# Patient Record
Sex: Female | Born: 1940 | Race: White | Hispanic: No | Smoking: Never smoker
Health system: Southern US, Community
[De-identification: ages and names within clinical notes are randomized; demographics above are authoritative.]

## PROBLEM LIST (undated history)

## (undated) DIAGNOSIS — I252 Old myocardial infarction: Secondary | ICD-10-CM

## (undated) DIAGNOSIS — E1169 Type 2 diabetes mellitus with other specified complication: Secondary | ICD-10-CM

## (undated) DIAGNOSIS — N179 Acute kidney failure, unspecified: Secondary | ICD-10-CM

## (undated) DIAGNOSIS — G459 Transient cerebral ischemic attack, unspecified: Secondary | ICD-10-CM

## (undated) DIAGNOSIS — I739 Peripheral vascular disease, unspecified: Secondary | ICD-10-CM

## (undated) DIAGNOSIS — I10 Essential (primary) hypertension: Secondary | ICD-10-CM

## (undated) DIAGNOSIS — F419 Anxiety disorder, unspecified: Secondary | ICD-10-CM

## (undated) DIAGNOSIS — E079 Disorder of thyroid, unspecified: Secondary | ICD-10-CM

## (undated) DIAGNOSIS — E1151 Type 2 diabetes mellitus with diabetic peripheral angiopathy without gangrene: Secondary | ICD-10-CM

## (undated) DIAGNOSIS — E785 Hyperlipidemia, unspecified: Secondary | ICD-10-CM

## (undated) DIAGNOSIS — I4891 Unspecified atrial fibrillation: Secondary | ICD-10-CM

## (undated) HISTORY — PX: ABDOMINAL HYSTERECTOMY: SHX81

## (undated) HISTORY — PX: CARDIAC CATHETERIZATION: SHX172

## (undated) HISTORY — PX: CHOLECYSTECTOMY: SHX55

---

## 2011-05-11 DIAGNOSIS — E119 Type 2 diabetes mellitus without complications: Secondary | ICD-10-CM | POA: Insufficient documentation

## 2011-05-11 DIAGNOSIS — M1990 Unspecified osteoarthritis, unspecified site: Secondary | ICD-10-CM | POA: Insufficient documentation

## 2014-01-25 ENCOUNTER — Encounter (HOSPITAL_COMMUNITY): Payer: Self-pay | Admitting: Anesthesiology

## 2014-01-25 ENCOUNTER — Encounter (HOSPITAL_COMMUNITY): Payer: Medicare Other | Admitting: Anesthesiology

## 2014-01-25 ENCOUNTER — Other Ambulatory Visit: Payer: Self-pay | Admitting: Cardiology

## 2014-01-25 ENCOUNTER — Emergency Department (HOSPITAL_COMMUNITY): Payer: Medicare Other

## 2014-01-25 ENCOUNTER — Emergency Department (HOSPITAL_COMMUNITY): Payer: Medicare Other | Admitting: Anesthesiology

## 2014-01-25 ENCOUNTER — Encounter (HOSPITAL_COMMUNITY): Payer: Self-pay | Admitting: Emergency Medicine

## 2014-01-25 ENCOUNTER — Ambulatory Visit (HOSPITAL_COMMUNITY): Admit: 2014-01-25 | Payer: Self-pay | Admitting: Cardiology

## 2014-01-25 ENCOUNTER — Inpatient Hospital Stay (HOSPITAL_COMMUNITY)
Admission: EM | Admit: 2014-01-25 | Discharge: 2014-03-06 | DRG: 216 | Disposition: A | Discharge status: Other Acute Inpatient Hospital | Payer: Medicare Other | Attending: Cardiothoracic Surgery | Admitting: Cardiothoracic Surgery

## 2014-01-25 ENCOUNTER — Encounter (HOSPITAL_COMMUNITY)
Admission: EM | Disposition: A | Discharge status: Nursing Home (Includes ICF) | Payer: Self-pay | Source: Home / Self Care | Attending: Cardiothoracic Surgery

## 2014-01-25 ENCOUNTER — Encounter (HOSPITAL_COMMUNITY)
Admission: EM | Disposition: A | Discharge status: Nursing Home (Includes ICF) | Payer: Medicare Other | Source: Home / Self Care | Attending: Cardiothoracic Surgery

## 2014-01-25 DIAGNOSIS — I252 Old myocardial infarction: Secondary | ICD-10-CM

## 2014-01-25 DIAGNOSIS — I50810 Right heart failure, unspecified: Secondary | ICD-10-CM

## 2014-01-25 DIAGNOSIS — I482 Chronic atrial fibrillation, unspecified: Secondary | ICD-10-CM

## 2014-01-25 DIAGNOSIS — M6250 Muscle wasting and atrophy, not elsewhere classified, unspecified site: Secondary | ICD-10-CM | POA: Diagnosis not present

## 2014-01-25 DIAGNOSIS — Z951 Presence of aortocoronary bypass graft: Secondary | ICD-10-CM

## 2014-01-25 DIAGNOSIS — I5033 Acute on chronic diastolic (congestive) heart failure: Secondary | ICD-10-CM | POA: Diagnosis not present

## 2014-01-25 DIAGNOSIS — I4891 Unspecified atrial fibrillation: Secondary | ICD-10-CM | POA: Diagnosis present

## 2014-01-25 DIAGNOSIS — E872 Acidosis, unspecified: Secondary | ICD-10-CM | POA: Diagnosis not present

## 2014-01-25 DIAGNOSIS — N189 Chronic kidney disease, unspecified: Secondary | ICD-10-CM | POA: Diagnosis present

## 2014-01-25 DIAGNOSIS — E785 Hyperlipidemia, unspecified: Secondary | ICD-10-CM | POA: Diagnosis present

## 2014-01-25 DIAGNOSIS — Z7982 Long term (current) use of aspirin: Secondary | ICD-10-CM

## 2014-01-25 DIAGNOSIS — E1151 Type 2 diabetes mellitus with diabetic peripheral angiopathy without gangrene: Secondary | ICD-10-CM | POA: Diagnosis present

## 2014-01-25 DIAGNOSIS — E1159 Type 2 diabetes mellitus with other circulatory complications: Secondary | ICD-10-CM | POA: Diagnosis present

## 2014-01-25 DIAGNOSIS — J90 Pleural effusion, not elsewhere classified: Secondary | ICD-10-CM | POA: Diagnosis not present

## 2014-01-25 DIAGNOSIS — E871 Hypo-osmolality and hyponatremia: Secondary | ICD-10-CM | POA: Diagnosis not present

## 2014-01-25 DIAGNOSIS — E46 Unspecified protein-calorie malnutrition: Secondary | ICD-10-CM | POA: Diagnosis not present

## 2014-01-25 DIAGNOSIS — I10 Essential (primary) hypertension: Secondary | ICD-10-CM | POA: Diagnosis present

## 2014-01-25 DIAGNOSIS — J9601 Acute respiratory failure with hypoxia: Secondary | ICD-10-CM

## 2014-01-25 DIAGNOSIS — R1312 Dysphagia, oropharyngeal phase: Secondary | ICD-10-CM | POA: Diagnosis not present

## 2014-01-25 DIAGNOSIS — J9600 Acute respiratory failure, unspecified whether with hypoxia or hypercapnia: Secondary | ICD-10-CM | POA: Diagnosis not present

## 2014-01-25 DIAGNOSIS — E876 Hypokalemia: Secondary | ICD-10-CM | POA: Diagnosis not present

## 2014-01-25 DIAGNOSIS — F0390 Unspecified dementia without behavioral disturbance: Secondary | ICD-10-CM | POA: Diagnosis present

## 2014-01-25 DIAGNOSIS — J95822 Acute and chronic postprocedural respiratory failure: Secondary | ICD-10-CM | POA: Diagnosis not present

## 2014-01-25 DIAGNOSIS — I472 Ventricular tachycardia, unspecified: Secondary | ICD-10-CM | POA: Diagnosis present

## 2014-01-25 DIAGNOSIS — I2789 Other specified pulmonary heart diseases: Secondary | ICD-10-CM | POA: Diagnosis present

## 2014-01-25 DIAGNOSIS — I2510 Atherosclerotic heart disease of native coronary artery without angina pectoris: Secondary | ICD-10-CM

## 2014-01-25 DIAGNOSIS — D62 Acute posthemorrhagic anemia: Secondary | ICD-10-CM | POA: Diagnosis not present

## 2014-01-25 DIAGNOSIS — IMO0002 Reserved for concepts with insufficient information to code with codable children: Secondary | ICD-10-CM | POA: Diagnosis not present

## 2014-01-25 DIAGNOSIS — I498 Other specified cardiac arrhythmias: Secondary | ICD-10-CM | POA: Diagnosis present

## 2014-01-25 DIAGNOSIS — E039 Hypothyroidism, unspecified: Secondary | ICD-10-CM | POA: Diagnosis present

## 2014-01-25 DIAGNOSIS — I4729 Other ventricular tachycardia: Secondary | ICD-10-CM | POA: Diagnosis present

## 2014-01-25 DIAGNOSIS — I519 Heart disease, unspecified: Secondary | ICD-10-CM | POA: Diagnosis not present

## 2014-01-25 DIAGNOSIS — I2119 ST elevation (STEMI) myocardial infarction involving other coronary artery of inferior wall: Secondary | ICD-10-CM

## 2014-01-25 DIAGNOSIS — I4819 Other persistent atrial fibrillation: Secondary | ICD-10-CM

## 2014-01-25 DIAGNOSIS — T502X5A Adverse effect of carbonic-anhydrase inhibitors, benzothiadiazides and other diuretics, initial encounter: Secondary | ICD-10-CM | POA: Diagnosis not present

## 2014-01-25 DIAGNOSIS — I2582 Chronic total occlusion of coronary artery: Secondary | ICD-10-CM | POA: Diagnosis present

## 2014-01-25 DIAGNOSIS — Z8249 Family history of ischemic heart disease and other diseases of the circulatory system: Secondary | ICD-10-CM

## 2014-01-25 DIAGNOSIS — E8779 Other fluid overload: Secondary | ICD-10-CM

## 2014-01-25 DIAGNOSIS — I484 Atypical atrial flutter: Secondary | ICD-10-CM

## 2014-01-25 DIAGNOSIS — N170 Acute kidney failure with tubular necrosis: Secondary | ICD-10-CM | POA: Diagnosis not present

## 2014-01-25 DIAGNOSIS — I213 ST elevation (STEMI) myocardial infarction of unspecified site: Secondary | ICD-10-CM

## 2014-01-25 DIAGNOSIS — I4892 Unspecified atrial flutter: Secondary | ICD-10-CM | POA: Diagnosis not present

## 2014-01-25 DIAGNOSIS — J9811 Atelectasis: Secondary | ICD-10-CM

## 2014-01-25 DIAGNOSIS — N179 Acute kidney failure, unspecified: Secondary | ICD-10-CM | POA: Diagnosis present

## 2014-01-25 DIAGNOSIS — I70209 Unspecified atherosclerosis of native arteries of extremities, unspecified extremity: Secondary | ICD-10-CM | POA: Diagnosis present

## 2014-01-25 DIAGNOSIS — I480 Paroxysmal atrial fibrillation: Secondary | ICD-10-CM

## 2014-01-25 DIAGNOSIS — R34 Anuria and oliguria: Secondary | ICD-10-CM | POA: Diagnosis not present

## 2014-01-25 DIAGNOSIS — E669 Obesity, unspecified: Secondary | ICD-10-CM | POA: Diagnosis present

## 2014-01-25 DIAGNOSIS — G929 Unspecified toxic encephalopathy: Secondary | ICD-10-CM | POA: Diagnosis not present

## 2014-01-25 DIAGNOSIS — I739 Peripheral vascular disease, unspecified: Secondary | ICD-10-CM | POA: Diagnosis present

## 2014-01-25 DIAGNOSIS — Z7901 Long term (current) use of anticoagulants: Secondary | ICD-10-CM

## 2014-01-25 DIAGNOSIS — Z79899 Other long term (current) drug therapy: Secondary | ICD-10-CM

## 2014-01-25 DIAGNOSIS — N178 Other acute kidney failure: Secondary | ICD-10-CM

## 2014-01-25 DIAGNOSIS — Z6838 Body mass index (BMI) 38.0-38.9, adult: Secondary | ICD-10-CM

## 2014-01-25 DIAGNOSIS — J810 Acute pulmonary edema: Secondary | ICD-10-CM

## 2014-01-25 DIAGNOSIS — R29898 Other symptoms and signs involving the musculoskeletal system: Secondary | ICD-10-CM

## 2014-01-25 DIAGNOSIS — I129 Hypertensive chronic kidney disease with stage 1 through stage 4 chronic kidney disease, or unspecified chronic kidney disease: Secondary | ICD-10-CM | POA: Diagnosis present

## 2014-01-25 DIAGNOSIS — G92 Toxic encephalopathy: Secondary | ICD-10-CM | POA: Diagnosis not present

## 2014-01-25 DIAGNOSIS — F411 Generalized anxiety disorder: Secondary | ICD-10-CM | POA: Diagnosis present

## 2014-01-25 DIAGNOSIS — Z9861 Coronary angioplasty status: Secondary | ICD-10-CM

## 2014-01-25 DIAGNOSIS — I059 Rheumatic mitral valve disease, unspecified: Secondary | ICD-10-CM | POA: Diagnosis present

## 2014-01-25 DIAGNOSIS — Z8673 Personal history of transient ischemic attack (TIA), and cerebral infarction without residual deficits: Secondary | ICD-10-CM

## 2014-01-25 DIAGNOSIS — Y9210 Unspecified residential institution as the place of occurrence of the external cause: Secondary | ICD-10-CM | POA: Diagnosis not present

## 2014-01-25 DIAGNOSIS — D6959 Other secondary thrombocytopenia: Secondary | ICD-10-CM | POA: Diagnosis not present

## 2014-01-25 DIAGNOSIS — J189 Pneumonia, unspecified organism: Secondary | ICD-10-CM | POA: Diagnosis not present

## 2014-01-25 DIAGNOSIS — Y832 Surgical operation with anastomosis, bypass or graft as the cause of abnormal reaction of the patient, or of later complication, without mention of misadventure at the time of the procedure: Secondary | ICD-10-CM | POA: Diagnosis not present

## 2014-01-25 DIAGNOSIS — Z794 Long term (current) use of insulin: Secondary | ICD-10-CM

## 2014-01-25 DIAGNOSIS — Z9089 Acquired absence of other organs: Secondary | ICD-10-CM

## 2014-01-25 DIAGNOSIS — R079 Chest pain, unspecified: Secondary | ICD-10-CM | POA: Diagnosis not present

## 2014-01-25 DIAGNOSIS — I509 Heart failure, unspecified: Secondary | ICD-10-CM | POA: Diagnosis not present

## 2014-01-25 DIAGNOSIS — E1169 Type 2 diabetes mellitus with other specified complication: Secondary | ICD-10-CM | POA: Diagnosis present

## 2014-01-25 DIAGNOSIS — R570 Cardiogenic shock: Secondary | ICD-10-CM | POA: Diagnosis present

## 2014-01-25 DIAGNOSIS — I701 Atherosclerosis of renal artery: Secondary | ICD-10-CM | POA: Diagnosis present

## 2014-01-25 HISTORY — PX: LEFT HEART CATHETERIZATION WITH CORONARY ANGIOGRAM: SHX5451

## 2014-01-25 HISTORY — PX: CORONARY ARTERY BYPASS GRAFT: SHX141

## 2014-01-25 HISTORY — DX: Old myocardial infarction: I25.2

## 2014-01-25 HISTORY — DX: Unspecified atrial fibrillation: I48.91

## 2014-01-25 HISTORY — DX: Type 2 diabetes mellitus with other specified complication: E11.69

## 2014-01-25 HISTORY — DX: Hyperlipidemia, unspecified: E78.5

## 2014-01-25 HISTORY — DX: Disorder of thyroid, unspecified: E07.9

## 2014-01-25 HISTORY — DX: Peripheral vascular disease, unspecified: I73.9

## 2014-01-25 HISTORY — DX: Acute kidney failure, unspecified: N17.9

## 2014-01-25 HISTORY — PX: MITRAL VALVE REPLACEMENT: SHX147

## 2014-01-25 HISTORY — DX: Type 2 diabetes mellitus with diabetic peripheral angiopathy without gangrene: E11.51

## 2014-01-25 HISTORY — DX: Essential (primary) hypertension: I10

## 2014-01-25 HISTORY — DX: Anxiety disorder, unspecified: F41.9

## 2014-01-25 HISTORY — DX: Transient cerebral ischemic attack, unspecified: G45.9

## 2014-01-25 LAB — CBC
HCT: 41 % (ref 36.0–46.0)
HEMATOCRIT: 37.5 % (ref 36.0–46.0)
Hemoglobin: 14.6 g/dL (ref 12.0–15.0)
Hemoglobin: 12.8 g/dL (ref 12.0–15.0)
MCH: 31.2 pg (ref 26.0–34.0)
MCH: 30.3 pg (ref 26.0–34.0)
MCHC: 35.6 g/dL (ref 30.0–36.0)
MCHC: 34.1 g/dL (ref 30.0–36.0)
MCV: 87.6 fL (ref 78.0–100.0)
MCV: 88.7 fL (ref 78.0–100.0)
PLATELETS: 273 10*3/uL (ref 150–400)
Platelets: 272 10*3/uL (ref 150–400)
RBC: 4.68 MIL/uL (ref 3.87–5.11)
RBC: 4.23 MIL/uL (ref 3.87–5.11)
RDW: 13 % (ref 11.5–15.5)
RDW: 13.2 % (ref 11.5–15.5)
WBC: 12.2 10*3/uL — ABNORMAL HIGH (ref 4.0–10.5)
WBC: 13.5 10*3/uL — ABNORMAL HIGH (ref 4.0–10.5)

## 2014-01-25 LAB — COMPREHENSIVE METABOLIC PANEL
ALBUMIN: 3.9 g/dL (ref 3.5–5.2)
ALT: 11 U/L (ref 0–35)
ALT: 9 U/L (ref 0–35)
ANION GAP: 19 — AB (ref 5–15)
AST: 19 U/L (ref 0–37)
AST: 18 U/L (ref 0–37)
Albumin: 3.4 g/dL — ABNORMAL LOW (ref 3.5–5.2)
Alkaline Phosphatase: 83 U/L (ref 39–117)
Alkaline Phosphatase: 71 U/L (ref 39–117)
Anion gap: 17 — ABNORMAL HIGH (ref 5–15)
BILIRUBIN TOTAL: 0.7 mg/dL (ref 0.3–1.2)
BUN: 15 mg/dL (ref 6–23)
BUN: 15 mg/dL (ref 6–23)
CALCIUM: 8.4 mg/dL (ref 8.4–10.5)
CO2: 24 mEq/L (ref 19–32)
CO2: 21 mEq/L (ref 19–32)
CREATININE: 0.8 mg/dL (ref 0.50–1.10)
Calcium: 9.5 mg/dL (ref 8.4–10.5)
Chloride: 97 mEq/L (ref 96–112)
Chloride: 97 mEq/L (ref 96–112)
Creatinine, Ser: 0.89 mg/dL (ref 0.50–1.10)
GFR calc Af Amer: 72 mL/min — ABNORMAL LOW (ref >90)
GFR calc Af Amer: 82 mL/min — ABNORMAL LOW (ref >90)
GFR calc non Af Amer: 62 mL/min — ABNORMAL LOW (ref >90)
GFR calc non Af Amer: 71 mL/min — ABNORMAL LOW (ref >90)
Glucose, Bld: 371 mg/dL — ABNORMAL HIGH (ref 70–99)
Glucose, Bld: 412 mg/dL — ABNORMAL HIGH (ref 70–99)
POTASSIUM: 4.1 meq/L (ref 3.7–5.3)
Potassium: 4.1 mEq/L (ref 3.7–5.3)
Sodium: 138 mEq/L (ref 137–147)
Sodium: 137 mEq/L (ref 137–147)
TOTAL PROTEIN: 8.6 g/dL — AB (ref 6.0–8.3)
Total Bilirubin: 0.7 mg/dL (ref 0.3–1.2)
Total Protein: 7.1 g/dL (ref 6.0–8.3)

## 2014-01-25 LAB — LIPID PANEL
CHOLESTEROL: 239 mg/dL — AB (ref 0–200)
HDL: 30 mg/dL — ABNORMAL LOW (ref >39)
LDL Cholesterol: 177 mg/dL — ABNORMAL HIGH (ref 0–99)
TRIGLYCERIDES: 160 mg/dL — AB (ref <150)
Total CHOL/HDL Ratio: 8 RATIO
VLDL: 32 mg/dL (ref 0–40)

## 2014-01-25 LAB — ABO/RH: ABO/RH(D): A POS

## 2014-01-25 LAB — PREPARE RBC (CROSSMATCH)

## 2014-01-25 LAB — TROPONIN I: Troponin I: 1.86 ng/mL (ref <0.30)

## 2014-01-25 LAB — APTT
APTT: 30 s (ref 24–37)
aPTT: 41 seconds — ABNORMAL HIGH (ref 24–37)

## 2014-01-25 LAB — PROTIME-INR
INR: 1.05 (ref 0.00–1.49)
INR: 1.15 (ref 0.00–1.49)
Prothrombin Time: 13.7 seconds (ref 11.6–15.2)
Prothrombin Time: 14.7 seconds (ref 11.6–15.2)

## 2014-01-25 SURGERY — CORONARY ARTERY BYPASS GRAFTING (CABG)
Anesthesia: General | Site: Chest

## 2014-01-25 SURGERY — LEFT HEART CATHETERIZATION WITH CORONARY ANGIOGRAM
Anesthesia: Choice | Laterality: Bilateral

## 2014-01-25 MED ORDER — STERILE WATER FOR INJECTION IJ SOLN
INTRAMUSCULAR | Status: AC
  Filled 2014-01-25: qty 20

## 2014-01-25 MED ORDER — FENTANYL CITRATE 0.05 MG/ML IJ SOLN
INTRAMUSCULAR | Status: AC
  Filled 2014-01-25: qty 5

## 2014-01-25 MED ORDER — HEPARIN SODIUM (PORCINE) 1000 UNIT/ML IJ SOLN
INTRAMUSCULAR | Status: AC
  Filled 2014-01-25: qty 1

## 2014-01-25 MED ORDER — SODIUM CHLORIDE 0.9 % IV SOLN
INTRAVENOUS | Status: DC
  Filled 2014-01-25: qty 1

## 2014-01-25 MED ORDER — NITROGLYCERIN IN D5W 200-5 MCG/ML-% IV SOLN: INTRAVENOUS | Status: DC | PRN

## 2014-01-25 MED ORDER — AMIODARONE HCL 150 MG/3ML IV SOLN
INTRAVENOUS | Status: AC
  Administered 2014-01-25: 150 mg
  Filled 2014-01-25: qty 3

## 2014-01-25 MED ORDER — ASPIRIN 81 MG PO CHEW
324.0000 mg | CHEWABLE_TABLET | Freq: Once | ORAL | Status: AC
  Administered 2014-01-25: 324 mg via ORAL
  Filled 2014-01-25: qty 4

## 2014-01-25 MED ORDER — SODIUM CHLORIDE 0.9 % IV SOLN
INTRAVENOUS | Status: DC
  Filled 2014-01-25: qty 30

## 2014-01-25 MED ORDER — PROPOFOL 10 MG/ML IV BOLUS
INTRAVENOUS | Status: DC | PRN
  Administered 2014-01-25: 60 mg via INTRAVENOUS

## 2014-01-25 MED ORDER — MIDAZOLAM HCL 10 MG/2ML IJ SOLN
INTRAMUSCULAR | Status: AC
  Filled 2014-01-25: qty 2

## 2014-01-25 MED ORDER — PAPAVERINE HCL 30 MG/ML IJ SOLN
INTRAMUSCULAR | Status: DC | PRN
  Administered 2014-01-25

## 2014-01-25 MED ORDER — INSULIN ASPART 100 UNIT/ML ~~LOC~~ SOLN
0.0000 [IU] | Freq: Three times a day (TID) | SUBCUTANEOUS | Status: DC
  Administered 2014-01-25: 10 [IU] via SUBCUTANEOUS
  Filled 2014-01-25: qty 0.15

## 2014-01-25 MED ORDER — MIDAZOLAM HCL 5 MG/5ML IJ SOLN
INTRAMUSCULAR | Status: DC | PRN
  Administered 2014-01-25 (×2): 3 mg via INTRAVENOUS
  Administered 2014-01-26: 6 mg via INTRAVENOUS
  Administered 2014-01-26: 4 mg via INTRAVENOUS

## 2014-01-25 MED ORDER — VECURONIUM BROMIDE 10 MG IV SOLR
INTRAVENOUS | Status: AC
  Filled 2014-01-25: qty 20

## 2014-01-25 MED ORDER — PLASMA-LYTE 148 IV SOLN
INTRAVENOUS | Status: DC
  Filled 2014-01-25: qty 2.5

## 2014-01-25 MED ORDER — INSULIN REGULAR HUMAN 100 UNIT/ML IJ SOLN
100.0000 [IU] | INTRAMUSCULAR | Status: DC | PRN
  Administered 2014-01-25: 10 [IU]/h via INTRAVENOUS

## 2014-01-25 MED ORDER — AMINOCAPROIC ACID 250 MG/ML IV SOLN
INTRAVENOUS | Status: DC
  Filled 2014-01-25: qty 40

## 2014-01-25 MED ORDER — LACTATED RINGERS IV SOLN
INTRAVENOUS | Status: DC | PRN
  Administered 2014-01-25 – 2014-01-26 (×2): via INTRAVENOUS

## 2014-01-25 MED ORDER — POTASSIUM CHLORIDE 2 MEQ/ML IV SOLN
80.0000 meq | INTRAVENOUS | Status: DC
  Filled 2014-01-25: qty 40

## 2014-01-25 MED ORDER — EPINEPHRINE HCL 1 MG/ML IJ SOLN
0.5000 ug/min | INTRAMUSCULAR | Status: DC
  Filled 2014-01-25: qty 4

## 2014-01-25 MED ORDER — VANCOMYCIN HCL 1000 MG IV SOLR
1250.0000 mg | INTRAVENOUS | Status: DC | PRN
  Administered 2014-01-25: 1250 mg via INTRAVENOUS

## 2014-01-25 MED ORDER — DEXTROSE 5 % IV SOLN
1.5000 g | INTRAVENOUS | Status: DC
  Filled 2014-01-25: qty 1.5

## 2014-01-25 MED ORDER — DEXMEDETOMIDINE HCL IN NACL 400 MCG/100ML IV SOLN
INTRAVENOUS | Status: DC | PRN
  Administered 2014-01-25: 0.3 ug/kg/h via INTRAVENOUS

## 2014-01-25 MED ORDER — HEPARIN SODIUM (PORCINE) 5000 UNIT/ML IJ SOLN
4000.0000 [IU] | INTRAMUSCULAR | Status: AC
  Administered 2014-01-25: 4000 [IU] via INTRAVENOUS
  Filled 2014-01-25: qty 1

## 2014-01-25 MED ORDER — ONDANSETRON HCL 4 MG/2ML IJ SOLN
4.0000 mg | Freq: Once | INTRAMUSCULAR | Status: AC
  Administered 2014-01-25: 4 mg via INTRAVENOUS
  Filled 2014-01-25: qty 2

## 2014-01-25 MED ORDER — MAGNESIUM SULFATE 50 % IJ SOLN
40.0000 meq | INTRAMUSCULAR | Status: DC
  Filled 2014-01-25: qty 10

## 2014-01-25 MED ORDER — FENTANYL CITRATE 0.05 MG/ML IJ SOLN
INTRAMUSCULAR | Status: DC | PRN
  Administered 2014-01-25: 250 ug via INTRAVENOUS
  Administered 2014-01-25: 150 ug via INTRAVENOUS
  Administered 2014-01-25 (×3): 100 ug via INTRAVENOUS
  Administered 2014-01-25: 200 ug via INTRAVENOUS
  Administered 2014-01-25: 100 ug via INTRAVENOUS
  Administered 2014-01-26: 50 ug via INTRAVENOUS
  Administered 2014-01-26: 150 ug via INTRAVENOUS
  Administered 2014-01-26: 250 ug via INTRAVENOUS
  Administered 2014-01-26 (×2): 100 ug via INTRAVENOUS

## 2014-01-25 MED ORDER — SUCCINYLCHOLINE CHLORIDE 20 MG/ML IJ SOLN
INTRAMUSCULAR | Status: DC | PRN
  Administered 2014-01-25: 50 mg via INTRAVENOUS

## 2014-01-25 MED ORDER — DEXMEDETOMIDINE HCL IN NACL 400 MCG/100ML IV SOLN
0.1000 ug/kg/h | INTRAVENOUS | Status: DC
  Filled 2014-01-25: qty 100

## 2014-01-25 MED ORDER — SODIUM CHLORIDE 0.9 % IJ SOLN
OROMUCOSAL | Status: DC | PRN
  Administered 2014-01-25 (×2): via TOPICAL

## 2014-01-25 MED ORDER — SODIUM CHLORIDE 0.9 % IV SOLN
INTRAVENOUS | Status: DC
  Administered 2014-01-25: 20 mL/h via INTRAVENOUS
  Administered 2014-02-16: 15:00:00 via INTRAVENOUS

## 2014-01-25 MED ORDER — 0.9 % SODIUM CHLORIDE (POUR BTL) OPTIME
TOPICAL | Status: DC | PRN
  Administered 2014-01-25: 1000 mL

## 2014-01-25 MED ORDER — DEXTROSE 5 % IV SOLN
750.0000 mg | INTRAVENOUS | Status: DC
  Filled 2014-01-25: qty 750

## 2014-01-25 MED ORDER — DOPAMINE-DEXTROSE 3.2-5 MG/ML-% IV SOLN
2.0000 ug/kg/min | INTRAVENOUS | Status: DC
  Filled 2014-01-25: qty 250

## 2014-01-25 MED ORDER — VANCOMYCIN HCL 10 G IV SOLR
1250.0000 mg | INTRAVENOUS | Status: DC
  Filled 2014-01-25: qty 1250

## 2014-01-25 MED ORDER — ROCURONIUM BROMIDE 100 MG/10ML IV SOLN
INTRAVENOUS | Status: DC | PRN
Start: 2014-01-25 — End: 2014-01-26
  Administered 2014-01-25 – 2014-01-26 (×3): 50 mg via INTRAVENOUS

## 2014-01-25 MED ORDER — SODIUM CHLORIDE 0.9 % IV SOLN
10.0000 mg | INTRAVENOUS | Status: DC | PRN
  Administered 2014-01-25: 20 ug/min via INTRAVENOUS

## 2014-01-25 MED ORDER — MORPHINE SULFATE 4 MG/ML IJ SOLN
4.0000 mg | Freq: Once | INTRAMUSCULAR | Status: AC
  Administered 2014-01-25: 4 mg via INTRAVENOUS
  Filled 2014-01-25: qty 1

## 2014-01-25 MED ORDER — HEMOSTATIC AGENTS (NO CHARGE) OPTIME
TOPICAL | Status: DC | PRN
  Administered 2014-01-25: 1 via TOPICAL

## 2014-01-25 MED ORDER — EPHEDRINE SULFATE 50 MG/ML IJ SOLN
INTRAMUSCULAR | Status: DC | PRN
  Administered 2014-01-25: 5 mg via INTRAVENOUS

## 2014-01-25 MED ORDER — VECURONIUM BROMIDE 10 MG IV SOLR
INTRAVENOUS | Status: DC | PRN
  Administered 2014-01-25 – 2014-01-26 (×5): 10 mg via INTRAVENOUS

## 2014-01-25 MED ORDER — NITROGLYCERIN IN D5W 200-5 MCG/ML-% IV SOLN
2.0000 ug/min | INTRAVENOUS | Status: DC
  Filled 2014-01-25: qty 250

## 2014-01-25 MED ORDER — LIDOCAINE HCL (CARDIAC) 20 MG/ML IV SOLN
INTRAVENOUS | Status: DC | PRN
  Administered 2014-01-25: 60 mg via INTRAVENOUS

## 2014-01-25 MED ORDER — DEXTROSE 5 % IV SOLN
1.5000 g | INTRAVENOUS | Status: DC | PRN
  Administered 2014-01-25: 1.5 g via INTRAVENOUS
  Administered 2014-01-26: .75 g via INTRAVENOUS

## 2014-01-25 MED ORDER — SODIUM CHLORIDE 0.9 % IV SOLN
10.0000 g | INTRAVENOUS | Status: DC | PRN
  Administered 2014-01-25: 5 g/h via INTRAVENOUS

## 2014-01-25 MED ORDER — HEPARIN SODIUM (PORCINE) 1000 UNIT/ML IJ SOLN
INTRAMUSCULAR | Status: DC | PRN
  Administered 2014-01-25: 5000 [IU] via INTRAVENOUS
  Administered 2014-01-26: 26000 [IU] via INTRAVENOUS
  Administered 2014-01-26: 8000 [IU] via INTRAVENOUS

## 2014-01-25 MED ORDER — NITROGLYCERIN IN D5W 200-5 MCG/ML-% IV SOLN
INTRAVENOUS | Status: DC | PRN
  Administered 2014-01-25: 5 ug/min via INTRAVENOUS

## 2014-01-25 MED ORDER — PHENYLEPHRINE HCL 10 MG/ML IJ SOLN
30.0000 ug/min | INTRAVENOUS | Status: DC
  Filled 2014-01-25: qty 2

## 2014-01-25 MED ORDER — ARTIFICIAL TEARS OP OINT
TOPICAL_OINTMENT | OPHTHALMIC | Status: DC | PRN
  Administered 2014-01-25: 1 via OPHTHALMIC

## 2014-01-25 MED ORDER — PHENYLEPHRINE HCL 10 MG/ML IJ SOLN
INTRAMUSCULAR | Status: DC | PRN
  Administered 2014-01-25 (×3): 40 ug via INTRAVENOUS

## 2014-01-25 SURGICAL SUPPLY — 136 items
ADAPTER CARDIO PERF ANTE/RETRO (ADAPTER) ×4 IMPLANT
APPLICATOR COTTON TIP 6IN STRL (MISCELLANEOUS) ×4 IMPLANT
ARTERIAL PRESSURE LINE (MISCELLANEOUS) ×12 IMPLANT
ATTRACTOMAT 16X20 MAGNETIC DRP (DRAPES) ×4 IMPLANT
BAG DECANTER FOR FLEXI CONT (MISCELLANEOUS) ×4 IMPLANT
BANDAGE ELASTIC 4 VELCRO ST LF (GAUZE/BANDAGES/DRESSINGS) ×8 IMPLANT
BANDAGE ELASTIC 6 VELCRO ST LF (GAUZE/BANDAGES/DRESSINGS) ×8 IMPLANT
BANDAGE GAUZE ELAST BULKY 4 IN (GAUZE/BANDAGES/DRESSINGS) ×4 IMPLANT
BASKET HEART  (ORDER IN 25'S) (MISCELLANEOUS) ×1
BASKET HEART (ORDER IN 25'S) (MISCELLANEOUS) ×1
BASKET HEART (ORDER IN 25S) (MISCELLANEOUS) ×2 IMPLANT
BLADE STERNUM SYSTEM 6 (BLADE) ×4 IMPLANT
BLADE SURG 11 STRL SS (BLADE) ×4 IMPLANT
BLADE SURG 12 STRL SS (BLADE) ×4 IMPLANT
BLADE SURG 15 STRL LF DISP TIS (BLADE) ×6 IMPLANT
BLADE SURG 15 STRL SS (BLADE) ×6
BLADE SURG ROTATE 9660 (MISCELLANEOUS) IMPLANT
BNDG GAUZE ELAST 4 BULKY (GAUZE/BANDAGES/DRESSINGS) ×8 IMPLANT
CANISTER SUCTION 2500CC (MISCELLANEOUS) ×4 IMPLANT
CANN PRFSN 3/8XRT ANG TPR 14 (MISCELLANEOUS) ×4
CANNULA AORTIC ROOT 9FR (CANNULA) ×4 IMPLANT
CANNULA GUNDRY RCSP 15FR (MISCELLANEOUS) ×8 IMPLANT
CANNULA PRFSN 3/8XRT ANG TPR14 (MISCELLANEOUS) ×4 IMPLANT
CANNULA SUMP PERICARDIAL (CANNULA) ×4 IMPLANT
CANNULA VEN MTL TIP RT (MISCELLANEOUS) ×4
CANNULA VENOUS LOW PROF 32X40 (CANNULA) IMPLANT
CANNULA VESSEL 3MM BLUNT TIP (CANNULA) ×12 IMPLANT
CANNULA VRC MALB SNGL STG 28FR (MISCELLANEOUS) ×2 IMPLANT
CARDIAC SUCTION (MISCELLANEOUS) ×4 IMPLANT
CATH CPB KIT VANTRIGT (MISCELLANEOUS) ×4 IMPLANT
CATH ROBINSON RED A/P 18FR (CATHETERS) ×16 IMPLANT
CATH THORACIC 36FR RT ANG (CATHETERS) ×8 IMPLANT
CLIP FOGARTY SPRING 6M (CLIP) ×4 IMPLANT
CLIP TI WIDE RED SMALL 24 (CLIP) ×8 IMPLANT
CONN 3/8X1/2 ST GISH (MISCELLANEOUS) ×8 IMPLANT
CONN ST 1/4X3/8  BEN (MISCELLANEOUS) ×2
CONN ST 1/4X3/8 BEN (MISCELLANEOUS) ×2 IMPLANT
CONT SPEC 4OZ CLIKSEAL STRL BL (MISCELLANEOUS) ×4 IMPLANT
COUNTER NEEDLE 20 DBL MAG RED (NEEDLE) ×8 IMPLANT
COVER MAYO STAND STRL (DRAPES) ×4 IMPLANT
COVER SURGICAL LIGHT HANDLE (MISCELLANEOUS) ×8 IMPLANT
CRADLE DONUT ADULT HEAD (MISCELLANEOUS) ×4 IMPLANT
DERMABOND ADVANCED (GAUZE/BANDAGES/DRESSINGS) ×6
DERMABOND ADVANCED .7 DNX12 (GAUZE/BANDAGES/DRESSINGS) ×6 IMPLANT
DRAIN CHANNEL 15F RND FF W/TCR (WOUND CARE) ×4 IMPLANT
DRAIN CHANNEL 32F RND 10.7 FF (WOUND CARE) ×4 IMPLANT
DRAPE CARDIOVASCULAR INCISE (DRAPES) ×2
DRAPE SLUSH/WARMER DISC (DRAPES) ×4 IMPLANT
DRAPE SRG 135X102X78XABS (DRAPES) ×2 IMPLANT
DRSG AQUACEL AG ADV 3.5X14 (GAUZE/BANDAGES/DRESSINGS) ×4 IMPLANT
ELECT BLADE 4.0 EZ CLEAN MEGAD (MISCELLANEOUS) ×4
ELECT BLADE 6.5 EXT (BLADE) ×4 IMPLANT
ELECT CAUTERY BLADE 6.4 (BLADE) ×4 IMPLANT
ELECT REM PT RETURN 9FT ADLT (ELECTROSURGICAL) ×8
ELECTRODE BLDE 4.0 EZ CLN MEGD (MISCELLANEOUS) ×2 IMPLANT
ELECTRODE REM PT RTRN 9FT ADLT (ELECTROSURGICAL) ×4 IMPLANT
EVACUATOR SILICONE 100CC (DRAIN) ×4 IMPLANT
GLOVE BIO SURGEON STRL SZ 6 (GLOVE) ×8 IMPLANT
GLOVE BIO SURGEON STRL SZ7.5 (GLOVE) ×16 IMPLANT
GLOVE BIOGEL M 6.5 STRL (GLOVE) ×28 IMPLANT
GLOVE BIOGEL PI IND STRL 7.0 (GLOVE) ×6 IMPLANT
GLOVE BIOGEL PI INDICATOR 7.0 (GLOVE) ×6
GOWN STRL REUS W/ TWL LRG LVL3 (GOWN DISPOSABLE) ×20 IMPLANT
GOWN STRL REUS W/TWL LRG LVL3 (GOWN DISPOSABLE) ×20
HEMOSTAT POWDER SURGIFOAM 1G (HEMOSTASIS) ×12 IMPLANT
HEMOSTAT SURGICEL 2X14 (HEMOSTASIS) ×4 IMPLANT
INSERT FOGARTY XLG (MISCELLANEOUS) IMPLANT
KIT BASIN OR (CUSTOM PROCEDURE TRAY) ×4 IMPLANT
KIT ROOM TURNOVER OR (KITS) ×4 IMPLANT
KIT SUCTION CATH 14FR (SUCTIONS) ×4 IMPLANT
KIT VASOVIEW ACCESSORY VH 2004 (KITS) ×4 IMPLANT
KIT VASOVIEW W/TROCAR VH 2000 (KITS) ×4 IMPLANT
LEAD PACING MYOCARDI (MISCELLANEOUS) ×4 IMPLANT
LOOP VESSEL SUPERMAXI WHITE (MISCELLANEOUS) ×4 IMPLANT
MARKER GRAFT CORONARY BYPASS (MISCELLANEOUS) ×12 IMPLANT
MATRIX HEMOSTAT SURGIFLO (HEMOSTASIS) ×4 IMPLANT
NS IRRIG 1000ML POUR BTL (IV SOLUTION) ×20 IMPLANT
PACK OPEN HEART (CUSTOM PROCEDURE TRAY) ×4 IMPLANT
PAD ARMBOARD 7.5X6 YLW CONV (MISCELLANEOUS) ×8 IMPLANT
PAD ELECT DEFIB RADIOL ZOLL (MISCELLANEOUS) ×4 IMPLANT
PENCIL BUTTON HOLSTER BLD 10FT (ELECTRODE) ×4 IMPLANT
PUNCH AORTIC ROT 4.0MM RCL 40 (MISCELLANEOUS) ×4 IMPLANT
PUNCH AORTIC ROTATE 4.0MM (MISCELLANEOUS) IMPLANT
PUNCH AORTIC ROTATE 4.5MM 8IN (MISCELLANEOUS) IMPLANT
PUNCH AORTIC ROTATE 5MM 8IN (MISCELLANEOUS) IMPLANT
SOLUTION ANTI FOG 6CC (MISCELLANEOUS) ×4 IMPLANT
SPONGE GAUZE 4X4 12PLY (GAUZE/BANDAGES/DRESSINGS) ×8 IMPLANT
SPONGE GAUZE 4X4 12PLY STER LF (GAUZE/BANDAGES/DRESSINGS) ×12 IMPLANT
STOPCOCK 4 WAY LG BORE MALE ST (IV SETS) ×4 IMPLANT
SURGIFLO W/THROMBIN 8M KIT (HEMOSTASIS) ×4 IMPLANT
SUT BONE WAX W31G (SUTURE) ×4 IMPLANT
SUT ETHIBON 2 0 V 52N 30 (SUTURE) ×12 IMPLANT
SUT ETHIBOND 2 0 SH (SUTURE) ×16 IMPLANT
SUT ETHIBOND 2 0 SH 36X2 (SUTURE) ×4 IMPLANT
SUT ETHILON 3 0 FSL (SUTURE) ×4 IMPLANT
SUT ETHILON 3 0 PS 1 (SUTURE) ×4 IMPLANT
SUT MNCRL AB 4-0 PS2 18 (SUTURE) ×16 IMPLANT
SUT PROLENE 3 0 RB 1 (SUTURE) ×4 IMPLANT
SUT PROLENE 3 0 SH 1 (SUTURE) ×4 IMPLANT
SUT PROLENE 3 0 SH DA (SUTURE) ×4 IMPLANT
SUT PROLENE 3 0 SH1 36 (SUTURE) ×12 IMPLANT
SUT PROLENE 4 0 RB 1 (SUTURE) ×10
SUT PROLENE 4 0 SH DA (SUTURE) ×24 IMPLANT
SUT PROLENE 4-0 RB1 .5 CRCL 36 (SUTURE) ×10 IMPLANT
SUT PROLENE 5 0 C 1 36 (SUTURE) IMPLANT
SUT PROLENE 6 0 C 1 30 (SUTURE) ×16 IMPLANT
SUT PROLENE 6 0 CC (SUTURE) ×20 IMPLANT
SUT PROLENE 8 0 BV175 6 (SUTURE) IMPLANT
SUT PROLENE BLUE 7 0 (SUTURE) ×12 IMPLANT
SUT SILK  1 MH (SUTURE)
SUT SILK 1 MH (SUTURE) IMPLANT
SUT SILK 1 TIES 10X30 (SUTURE) ×4 IMPLANT
SUT SILK 2 0 SH CR/8 (SUTURE) ×4 IMPLANT
SUT SILK 3 0 SH CR/8 (SUTURE) IMPLANT
SUT STEEL 6MS V (SUTURE) ×8 IMPLANT
SUT STEEL SZ 6 DBL 3X14 BALL (SUTURE) ×4 IMPLANT
SUT VIC AB 1 CTX 36 (SUTURE) ×6
SUT VIC AB 1 CTX36XBRD ANBCTR (SUTURE) ×6 IMPLANT
SUT VIC AB 2-0 CT1 27 (SUTURE) ×8
SUT VIC AB 2-0 CT1 TAPERPNT 27 (SUTURE) ×8 IMPLANT
SUT VIC AB 2-0 CTX 27 (SUTURE) IMPLANT
SUT VIC AB 3-0 X1 27 (SUTURE) IMPLANT
SUTURE E-PAK OPEN HEART (SUTURE) ×4 IMPLANT
SYSTEM SAHARA CHEST DRAIN ATS (WOUND CARE) ×4 IMPLANT
TAPE CLOTH SURG 4X10 WHT LF (GAUZE/BANDAGES/DRESSINGS) ×12 IMPLANT
TOWEL OR 17X24 6PK STRL BLUE (TOWEL DISPOSABLE) ×8 IMPLANT
TOWEL OR 17X26 10 PK STRL BLUE (TOWEL DISPOSABLE) ×12 IMPLANT
TRAY CATH LUMEN 1 20CM STRL (SET/KITS/TRAYS/PACK) ×4 IMPLANT
TRAY FOLEY IC TEMP SENS 16FR (CATHETERS) ×4 IMPLANT
TUBING INSUFFLATION 10FT LAP (TUBING) ×4 IMPLANT
UNDERPAD 30X30 INCONTINENT (UNDERPADS AND DIAPERS) ×4 IMPLANT
VALVE MAGNA MITRAL 25MM (Prosthesis & Implant Heart) ×4 IMPLANT
VALVE MITRAL SZ 27 (Prosthesis & Implant Heart) ×4 IMPLANT
VRC MALLEABLE SINGLE STG 28FR (MISCELLANEOUS) ×4
WATER STERILE IRR 1000ML POUR (IV SOLUTION) ×8 IMPLANT
YANKAUER SUCT BULB TIP NO VENT (SUCTIONS) ×4 IMPLANT

## 2014-01-25 NOTE — ED Notes (Signed)
Report given to Riki Rusk, RN cardiac cath Lab nurse @ [facility].

## 2014-01-25 NOTE — Interval H&P Note (Signed)
History and Physical Interval Note:  01/25/2014 8:35 PM  [name]  has presented today for surgery, with the diagnosis of stemi  The various methods of treatment have been discussed with the patient and family. After consideration of risks, benefits and other options for treatment, the patient has consented to  Procedure(s): LEFT HEART CATHETERIZATION WITH CORONARY ANGIOGRAM (Bilateral) as a surgical intervention .  The patient's history has been reviewed, patient examined, no change in status, stable for surgery.  I have reviewed the patient's chart and labs.  Questions were answered to the patient's satisfaction.     Briston Lax W

## 2014-01-25 NOTE — Consult Note (Signed)
[address]       [place] [address]             [phone]        [name] [facility] Medical Record #[id number] Date of Birth: 1940-08-02  Referring: No ref. provider found Primary Care: Mickle Plumb, NP  Chief Complaint:    Chief Complaint  Patient presents with  . Emesis  . Chest Pain   patient examined, coronary arteriogram is reviewed with Dr. Bryan Lemma in the cath lab   History of Present Illness:     73 year old Caucasian diabetic nonsmoker with history of CAD status post PCI at [facility] presents with chest pain and nausea since earlier today. She presented to the [facility] hospital emergency department and had ST segment changes in the inferior leads-sTEMI. She had V. tach and was started on amiodarone and transferred to . She underwent emergency cardiac catheterization via right radial artery demonstrating occlusion of the RCA and high-grade stenosis of the LAD and circumflex vessels and  occluded diagonal. The patient had persistent ST segment elevation in the inferior leads. The patient had low blood pressure. LVEDP was 32 and a short ventriculogram appeared to show EF of 40%. Emergency CT surgical evaluation was requested. A balloon pump was placed via the right femoral artery after aortogram showed mild aortoiliac disease, moderate renal artery stenosis bilaterally. Because the patient's ongoing ST segment elevation in critical coronary anatomy emergency surgical revascularization was recommended. By report the patient is taking Plavix at home but her last dose is unknown.  I examined the patient in the cardiac cath lab. She was coherent and appropriate. She was breathing on nasal cannula in a sinus rhythm. I discussed emergency CABG because PCI was not possible because of extensive RCA disease according to the operating interventional cardiologist. I discussed the details of CABG in detail with both the patient and her daughter  [name]. The patient and the daughter understood that emergency CABG was her best chance for survival and to preserve LV function and myocardium from progression of her MI. They understood the risks involved including risks of death bleeding blood transfusion renal failure ventilator dependence stroke and infection.  Current Activity/ Functional Status: The patient lives with her nephew [name] and is not very mobile. She has been depressed due to the sudden death of her son 75 month ago from an acute MI   Zubrod Score: At the time of surgery this patient's most appropriate activity status/level should be described as: []     0    Normal activity, no symptoms []     1    Restricted in physical strenuous activity but ambulatory, able to do out light work []     2    Ambulatory and capable of self care, unable to do work activities, up and about                 more than 50%  Of the time                            [x]     3    Only limited self care, in bed greater than 50% of waking hours []     4    Completely disabled, no self care, confined to bed or chair []     5    Moribund  Past Medical History  Diagnosis Date  . History  of MI (myocardial infarction)     [facility] (cannot get cath report on Care Everywhere(  . Hypertension   . TIA (transient ischemic attack)   . Thyroid disease   . Anxiety   . Diabetes mellitus type 2 with peripheral artery disease   . PAD (peripheral artery disease)     Past Surgical History  Procedure Laterality Date  . Cardiac catheterization      PCI - unknown vessel or stent; [facility]; [facility].  . Abdominal hysterectomy    . Cholecystectomy      History  Smoking status  . Never Smoker   Smokeless tobacco  . Never Used    History  Alcohol Use No    History   Social History  . Marital Status: Widowed    Spouse Name: N/A    Number of Children: N/A  . Years of Education: N/A   Occupational History  . Not on file.     Social History Main Topics  . Smoking status: Never Smoker   . Smokeless tobacco: Never Used  . Alcohol Use: No  . Drug Use: No  . Sexual Activity: Not on file   Other Topics Concern  . Not on file   Social History Narrative  . No narrative on file    No Known Allergies  Current Facility-Administered Medications  Medication Dose Route Frequency Provider Last Rate Last Dose  . 0.9 %  sodium chloride infusion   Intravenous Continuous Ward GivensIva L Knapp, MD 20 mL/hr at 01/25/14 1847 20 mL/hr at 01/25/14 1847  . [START ON 01/26/2014] insulin aspart (novoLOG) injection 0-15 Units  0-15 Units Subcutaneous TID WC Quintella Reichertraci R Turner, MD        Prescriptions prior to admission  Medication Sig Dispense Refill  . ALPRAZolam (XANAX) 1 MG tablet Take 1 mg by mouth 4 (four) times daily.      Marland Kitchen. amitriptyline (ELAVIL) 25 MG tablet Take 25 mg by mouth at bedtime.      Marland Kitchen. amLODipine (NORVASC) 5 MG tablet Take 5 mg by mouth daily.      Marland Kitchen. aspirin EC 81 MG tablet Take 81 mg by mouth daily.      . hydrochlorothiazide (MICROZIDE) 12.5 MG capsule Take 12.5 mg by mouth daily.      Marland Kitchen. HYDROcodone-acetaminophen (NORCO) 10-325 MG per tablet Take 1 tablet by mouth every 6 (six) hours as needed. Pain      . insulin glargine (LANTUS) 100 UNIT/ML injection Inject 60 Units into the skin at bedtime.      Marland Kitchen. levothyroxine (SYNTHROID, LEVOTHROID) 75 MCG tablet Take 75 mcg by mouth daily before breakfast.      . lovastatin (MEVACOR) 40 MG tablet Take 40 mg by mouth at bedtime.      . metoprolol succinate (TOPROL-XL) 50 MG 24 hr tablet Take 50 mg by mouth daily. Take with or immediately following a meal.      . omeprazole (PRILOSEC) 20 MG capsule Take 20 mg by mouth daily.      . quinapril (ACCUPRIL) 40 MG tablet Take 40 mg by mouth daily.        Family History  Problem Relation Age of Onset  . Heart attack Mother   . Heart disease Mother   . Heart attack Father   . Heart disease Father      Review of Systems:      Cardiac Review of Systems: Y or N  Chest Pain [  yes ]  Resting SOB [ yes  ] Exertional SOB  Mahler.Beck  ]  Orthopnea [ yes ]   Pedal Edema [no   ]    Palpitations Mahler.Beck  ] Syncope  [no  ]   Presyncope no [   ]  General Review of Systems: [Y] = yes [  ]=no Constitional: recent weight change [  ]; anorexia [yes-depression following sounds death ]; fatigue [  ]; nausea [  ]; night sweats [  ]; fever [  ]; or chills [  ]                                                               Dental: poor dentition[yes  ]; Last Dentist visit: Unknown  Eye : blurred vision [  ]; diplopia [   ]; vision changes [  ];  Amaurosis fugax[  ]; Resp: cough [  ];  wheezing[  ];  hemoptysis[  ]; shortness of breath[yes  ]; paroxysmal nocturnal dyspnea[  ]; dyspnea on exertion[ yes ]; or orthopnea[  ];  GI:  gallstones[  ], vomiting[  ];  dysphagia[  ]; melena[  ];  hematochezia [  ]; heartburn[  ];   Hx of  Colonoscopy[  ]; GU: kidney stones [  ]; hematuria[  ];   dysuria [  ];  nocturia[  ];  history of     obstruction [  ]; urinary frequency [  ]             Skin: rash, swelling[  ];, hair loss[  ];  peripheral edema[  ];  or itching[  ]; Musculosketetal: myalgias[  ];  joint swelling[  ];  joint erythema[  ];  joint pain[  ];  back pain[  ];  Heme/Lymph: bruising[  ];  bleeding[  ];  anemia[  ];  Neuro: Kekrops.Rea  ];  headaches[  ];  stroke[  ];  vertigo[  ];  seizures[  ];   paresthesias[  ];  difficulty walking[ yes secondary to weakness ];  Psych:depression[  ]; anxiety[  ];  Endocrine: diabetes[yes on 8 Lantus insulin ];  thyroid dysfunction[yes on Synthroid  ];  Immunizations: Flu [  ]; Pneumococcal[  ];  Other:  Physical Exam: BP 117/78  Pulse 137  Temp(Src) 97.4 F (36.3 C) (Oral)  Resp 28  Ht 5\' 9"  (1.753 m)  Wt 180 lb (81.647 kg)  BMI 26.57 kg/m2  SpO2 97%  Exam General appearance-elderly fragile Caucasian female on the cath lab responsive, anxious HEENT-pupils equal, upper teeth missing, lower teeth  intact Neck-no mass no JVD, no she did Thorax-no deformity scars or tenderness-breath sounds diminished Cardiac-regular rate and rhythm without murmur or gallop Abdomen-soft nontender without pulsatile mass Extremities-no edema tenderness or cyanosis, extremities fairly warm. Radial artery catheter in right wrist, balloon pump in right femoral artery Neuro responsive, anxious but no focal motor deficit noted   Diagnostic Studies & Laboratory data:     Recent Radiology Findings:   No results found.    Recent Lab Findings: Lab Results  Component Value Date   WBC 13.5* 01/25/2014   HGB 12.8 01/25/2014   HCT 37.5 01/25/2014   PLT 273 01/25/2014   GLUCOSE 412* 01/25/2014   CHOL 239* 01/25/2014  TRIG 160* 01/25/2014   HDL 30* 01/25/2014   LDLCALC 177* 01/25/2014   ALT 9 01/25/2014   AST 18 01/25/2014   NA 137 01/25/2014   K 4.1 01/25/2014   CL 97 01/25/2014   CREATININE 0.80 01/25/2014   BUN 15 01/25/2014   CO2 21 01/25/2014   INR 1.15 01/25/2014      Assessment / Plan:      73 year old fragile Caucasian diabetic presents with ongoing chest pain since earlier today associated with nausea. She presented at an outside hospital with positive enzymes ST segment elevation in the anterior leads and runs of V. tach. Cardiac catheterization demonstrates occlusion of the RCA which is not approachable with PCI according to the operating interventional cardiologist and the patient has severe three-vessel CAD. A balloon pump is in place and the patient be taken for emergency CABG. This is been discussed in detail the patient as well as her daughter [name]. They understand the benefits risks and alternatives and agree to proceed with emergency high-risk CABG      @ME1 @ 01/25/2014 9:54 PM

## 2014-01-25 NOTE — Anesthesia Procedure Notes (Addendum)
Procedure Name: Intubation Date/Time: 01/25/2014 10:08 PM Performed by: Wray Kearns A Pre-anesthesia Checklist: Patient identified, Timeout performed, Emergency Drugs available, Suction available and Patient being monitored Patient Re-evaluated:Patient Re-evaluated prior to inductionOxygen Delivery Method: Circle system utilized Preoxygenation: Pre-oxygenation with 100% oxygen Intubation Type: IV induction, Rapid sequence and Cricoid Pressure applied Laryngoscope Size: Mac and 4 Grade View: Grade I Tube type: Subglottic suction tube Tube size: 7.5 mm Number of attempts: 1 Airway Equipment and Method: Stylet Placement Confirmation: ETT inserted through vocal cords under direct vision,  breath sounds checked- equal and bilateral and positive ETCO2 Secured at: 22 cm Tube secured with: Tape Dental Injury: Teeth and Oropharynx as per pre-operative assessment

## 2014-01-25 NOTE — CV Procedure (Signed)
CARDIAC CATHETERIZATION REPORT  NAME:  [name]   MRN: [id number] DOB:  Jun 27, 1941   ADMIT DATE: 01/25/2014 Procedure Date: 01/25/2014  INTERVENTIONAL CARDIOLOGIST: Leonie Man, M.D., MS PRIMARY CARE PROVIDER: Dorrene German, NP PRIMARY CARDIOLOGIST: None currently, new to [facility].  PATIENT:  [name] is a 73 y.o. female with a known history of atherosclerotic coronary artery disease with remote MI treated with PCI at [facility]. Unfortunately we are not able to access any reports on this via Care Everywhere.  We were able to determine that she passed that she includes HTN, HLD, TIA, PAD, and Type 2 DM (with complications - PAD, CAD and neuropathy) who presented to [facility] with complaints chest pain that began earlier this morning.. She said that it started this am and was associated with nausea, vomiting and diaphoresis. She is a very poor story and with a history of mild dementia that is become progressively worse following the recent death of her younger son from cardiac arrest with prolonged hospital stay he on a support. As a result of her symptoms, she presented to [facility] this evening.  Her triage EKG showed inferior STEMI. She was started in IV Heparin, ASA, morphine and Zofran. She then developed monomorphic ventricular tachycardia and was given an amiodarone bolus of 148m and started on a drip. [facility]. Upon arrival she continues to have inferior ST elevations with lateral component. Roughly 2-3 mm in leads 23 aVF and slightly less pronounced in the lateral leads. No further VT. Upon presentation to the Cath Lab, she was noting roughly 2/10 chest pain, but appeared comfortable.  PRE-OPERATIVE DIAGNOSIS:    Inferior STEMI  Sustained Ventricular Tachycardia with spontaneous resolution  PROCEDURES PERFORMED:    Left Heart Catheterization with Native Coronary Angiography via  Right Radial Artery access  Abdominal Aortic Angiography with Bilateral Iliofemoral Runoff via a Right Common Femoral Arterial Aacess  Placement of an Intra-Aortic Balloon Pump (IABP)  PROCEDURE: The patient was [facility] the [facility]  She was prepped and draped in the usual sterile fashion for either right radial or femoral artery access. A modified Allen's test was performed on the right wrist demonstrating excellent collateral flow for radial access.   Sterile technique was used including antiseptics, cap, gloves, gown, hand hygiene, mask and sheet. Skin prep: Chlorhexidine.   Consent: Risks of procedure as well as the alternatives and risks of each were explained to the (patient/caregiver). Consent for procedure obtained.   Time Out: Verified patient identification, verified procedure, site/side was marked, verified correct patient position, special equipment/implants available, medications/allergies/relevent history reviewed, required imaging and test results available. Performed.  Access:    Right Radial Artery: 6 Fr Sheath -  Seldinger Technique (Angiocath Micropuncture Kit)  Radial Cocktail - 10 mL  Left Heart Catheterization: 5Fr Catheters advanced over a Versicore wire & exchanged over a Long-Exchange Safety J-wire Left & Right Coronary Artery Cineangiography: TIG 4.0 Catheter   LV Hemodynamics (15 mL Hand Injection LV Gram): Angled Pigtail Catheter.  Radial Sheath removed in the CTICU post CABG with TR Band Placement for hemostasis.   FINDINGS:  Hemodynamics:   Central Aortic Pressure / Mean: 92/60/76 mmHg  Left Ventricular Pressure / LVEDP: 92/21/25 mmHg  Left Ventriculography:  EF: ~50-55%  Wall Motion: possible mild inferior-inferolateral hypokinesis. Significant Mitral Annular Calcification noted.  Coronary Anatomy:  Dominance: Right  Left Main: Normal caliber, mild  calcification, but no obstructive lesions.  Bifurcates into the LAD and Circumflex. LAD: Large-caliber vessel that wraps the apex distally. It gives rise to a proximal diagonal branch that almost courses of the Ramus. Just beyond that branch there is a short eccentric 95% stenosis. The vessel then becomes very tortuous with a hairpin loop in the mid vessel. Beyond that there is a focal 60-70% stenosis and the vessel then tapers to a small caliber vessel that wraps the apex.  D1: Moderate caliber vessel that is 100% occluded proximally. There is mild collaterals from distal small diagonal branches with retrograde filling.  There is an inferior branch of this vessel is still patent, providing collateral flow distally. It has a focal 90% stenosis proximally.  Left Circumflex: Moderate to large caliber, nondominant vessel that gives off a very proximal OM1 branch is small, but relatively free of disease. It then gives off a moderate caliber OM2 to that as proximal 80% stenosis. At the takeoff of the OM 2 there appears to be a concentric 30% tubular lesion. The vessel then normalizes in the AV groove for a short segment prior to bifurcating into OM3 branches and the mid AV Groove Circumflex. Prior to this bifurcation there is a focal subtotal 95% lesion that involves the smaller OM 3 proximally with a 90% stenosis.  OM2: Moderate caliber vessel with proximal 80% stenosis, the remainder of the vessels is relatively free of significant disease.  OM 3: Small moderate caliber vessel with proximal 90% lesion  Mid AV Groove Circumflex: Continues as a moderate caliber vessel that bifurcates into a small to moderate OM4 and a small AV groove branch that gives rise to a small collateral to the Right Posterior Lateral System.these vessels have minimal luminal irregularities.   RCA: Large-caliber, dominant vessel. It is a very tortuous vessel with ostial 60-70% stenosis is very calcified. There is a proximal 40%  stenosis which appears to be proximal to the previously placed stent. After the initial bend in the vessel, there are tandem 80 turns each with 90 and 80% stenoses. The vessel then tapers distally where it appears to bifurcate into a very small caliber Posterior Descending Artery (which actually could be a marginal branch the lungs in conjunction with the true Posterior Descending Artery - this has a proximal 90% stenosis).  At this point it appears to be occluded with trace flow into appears to be the Right Posterior AV Groove Branch (RPAV)  There are a left to right collaterals and appears to fill the distal Right Posterolateral System.  After reviewing the initial angiography, the culprit lesion was thought to be the 100% distal RCA-RPAV occlusion, however, given the extent of disease in the mid RCA along the 100% D1, 90% proximal LAD & mid Cx ang 80% OM2 lesions, the decision was made to Consult Dr. Prescott Gum from CVTS for urgent CABG.  After viewing the films, he agreed with urgent/emergent CABG especially in light of prolonged VT with on-going ischemia.  The distal RCA would be very difficult to attempt PTCA due to proximal disease & tortuosity, so no pre-CABG PTCA will be attempted. Dr. Prescott Gum requested IABP placement for hemodynamic support pre&post-operatively.  We felt it best to start with Abdominal Aortic Angiography.  Abdominal Aortography:  Access: Right Common Femoral Artery: 6 Fr Sheath -  fluoroscopically guided modified Seldinger Technique  Abdominal Aortogram: A 5Fr Pigtail catheter was advanced over a J wire  into the abdominal aorta just below the diaphragm.  Abdominal Angiography with bilateral Iliofemoral runoff was performed - 15m contrast/sec over 20 sec.  The abdominal aorta is somewhat tortuous but normal caliber with mild to moderate calcification just proximal to the iliac bifurcation.  The Celiac, SMA, and IMA are normal caliber and patent  Left Renal Artery - ~60-70%  stenosis  Right Renal Artery - ~40% stenosis  Bilateral Common and External Iliac Arteries are normal caliber with mild calcification  The Left Internal Iliac Artery is normal caliber & free of significant disease  The Right Internal Iliac Artery has a proximal ~80% stenosis but is not well visualized.  Intra-Aortic Balloon Pump Placement:  Sheath exchanged for 7.5 Fr IABP Sheath  A 40 mL IABP was advanced under fluoroscopy over the IABP wire into the proximal Descending Aorta & position confirmed after inflation.  MEDICATIONS:  Anesthesia:  Local Lidocaine 2 ml radial; 10 ml Femoral  Sedation:  2 mg IV Versed,  Premedication: 4000 Units IV Heparin, 4 mg Zofran, 4 mg Morphine,,  Amiodarone 150 mg bolus, 327mhr infusion  Omnipaque Contrast: 105 ml Radial Cocktail: 5 mg Verapamil, 400 mcg NTG, 2 ml 2% Lidocaine in 10 ml NS Normal Saline Bolus 250 ML  PATIENT DISPOSITION:    The patient was transferred to the Cardiac OR under the care of Dr. VaPrescott Gum She is in a hemodynamicaly stable, chest pain free condition. No further Ventricular Tachycardiac  The patient tolerated the procedure well, and there were no complications.  EBL:   < 20 ml  The patient was stable before, during, and after the procedure.  POST-OPERATIVE DIAGNOSIS:    Severe multivessel CAD involving 100% distal RCA with tandem 90% & 80% lesions, proximal D1 100%, proximal LAD & AVGroove Circ 90% with 80% OM2.  Relatively preserved LVEF with significant mitral annular calcification. Unable to visualize any mitral regurgitation.  Moderate to severely elevated LVEDP.  Stable blood pressures after IV fluid bolus. Most likely consistent with response to radial cocktail  PLAN OF CARE:  After discussion with Dr. VaPrescott Gumthe plan is for the patient to go emergently to the OR for CABG   Would continue Amiodarone infusion for full loading dose post CABG   [facility]  [facility]  [facility]  HALeonie ManM.D., M.S. [facility] [address]  [phone]/06/2014 9:20 PM

## 2014-01-25 NOTE — ED Notes (Addendum)
Per family patient woke this morning c./o chest pain that radiates into left arm. Patient also was c/o shortness of breath and weakness. Family reports patient having nausea and vomiting. Patient has hx of MI with steent placement.

## 2014-01-25 NOTE — H&P (Addendum)
Admit date: 01/25/2014 Referring Physician:  Devoria AlbeIva Knapp, MD Primary Cardiologist[facility]  [facility] Chief complaint/reason for admission:  Chest pain/STEMI  HPI: This is a 73yo WF with a history of ASCAD with remote MI and [facility] but have not been able to access via Care Everywhere, HTN, TIA, type 2 DM and PAD who presented to [facility] with complaints chest pain.  She said that it started this am and was associated with nausea, vomiting and diaphoresis.  She is not a good historian but symptoms presented to [facility] this evening and EKG showed inferior STEMI.  She was started in IV Heparin, ASA, morphine and Zofran.  She then developed monomorphic CT and was given an amio bolus of 150mg  and started on a gtt.  She [facility] for emergent cath.    PMH:    Past Medical History  Diagnosis Date  . History of MI (myocardial infarction)     [facility] (cannot get cath report on Care Everywhere(  . Hypertension   . TIA (transient ischemic attack)   . Thyroid disease   . Anxiety   . Diabetes mellitus type 2 with peripheral artery disease   . PAD (peripheral artery disease)     PSH:    Past Surgical History  Procedure Laterality Date  . Cardiac catheterization      PCI - unknown vessel or stent; unknown date; [facility].  . Abdominal hysterectomy    . Cholecystectomy      ALLERGIES:   Review of patient's allergies indicates no known allergies.  Prior to Admit Meds:   (Not in a hospital admission) Family HX:    Family History  Problem Relation Age of Onset  . Heart attack Mother   . Heart disease Mother   . Heart attack Father   . Heart disease Father    Social HX:    History   Social History  . Marital Status: Widowed    Spouse Name: N/A    Number of Children: N/A  . Years of Education: N/A   Occupational History  . Not on file.   Social History Main Topics  . Smoking status: Never Smoker   . Smokeless tobacco: Never  Used  . Alcohol Use: No  . Drug Use: No  . Sexual Activity: Not on file   Other Topics Concern  . Not on file   Social History Narrative  . No narrative on file     ROS:  All 11 ROS were addressed and are negative except what is stated in the HPI  PHYSICAL EXAM Filed Vitals:   01/25/14 1845  BP: 117/78  Pulse:   Temp:   Resp: 28   General: Well developed, well nourished, in no acute distress Head: Eyes PERRLA, No xanthomas.   Normal cephalic and atramatic  Lungs:   Clear bilaterally to auscultation and percussion. Heart:   HRRR S1 S2 Pulses are 2+ & equal.            No carotid bruit. No JVD.  No abdominal bruits. No femoral bruits. Abdomen: Bowel sounds are positive, abdomen soft and non-tender without masses  Extremities:   No clubbing, cyanosis or edema.  DP +1 Neuro: Alert and oriented X 3. Psych:  Good affect, responds appropriately   Labs:   Lab Results  Component Value Date   WBC 12.2* 01/25/2014   HGB 14.6 01/25/2014   HCT  41.0 01/25/2014   MCV 87.6 01/25/2014   PLT 272 01/25/2014    Recent Labs Lab 01/25/14 1839  NA 138  K 4.1  CL 97  CO2 24  BUN 15  CREATININE 0.89  CALCIUM 9.5  PROT 8.6*  BILITOT 0.7  ALKPHOS 83  ALT 11  AST 19  GLUCOSE 371*   No results found for this basename: CKTOTAL, CKMB, CKMBINDEX, TROPONINI   No results found for this basename: PTT   Lab Results  Component Value Date   INR 1.05 01/25/2014         Radiology:  No results found.  EKG:  NSR with inferolateral ST elevation and old anterior MI  ASSESSMENT:  1.  Acute inferolateral STEMI complicated by ventricular tachycardia 2.  Ventricular tachycardia resolved after IV Amio 3.  History of CAD in the past with remote PCI at [facility] 4.  Type II DM 5.  H/O TIA 6.  PVD  PLAN:   1.  Admit to CCU 2.  Emergent cath by Dr. Herbie Baltimore 3.  ASA/statin/Brilinta/BB 4.  Will need to get a list of her home meds.  Family supposedly went home to get meds and came back to ER and  forgot to get them. Will have pharmacy verify home meds 5.  2D echo in am to assess RWMA's and EF 6.  Check FLP and HbA1C in am  [name], MD  01/25/2014  7:45 PM

## 2014-01-25 NOTE — ED Notes (Signed)
Attempted to call [facility] Cath Lab to give report on pt coming. No answer. Will attempt to call back again later.

## 2014-01-25 NOTE — ED Notes (Signed)
CRITICAL VALUE ALERT  Critical value received:  Troponin 1.86  Date of notification:  January 25, 2014  Time of notification:  1950  Critical value read back:Yes.    Nurse who received alert:  BKN  MD notified (1st page):  Lynelle Doctor  Time of first page:  1957  MD notified (2nd page):  Time of second page:  Responding MD:    Time MD responded:

## 2014-01-25 NOTE — ED Notes (Signed)
PT en-routeto [facility] cath lab via [facility] at this time.

## 2014-01-25 NOTE — ED Provider Notes (Signed)
CSN: [id number]     Arrival date & time 01/25/14  1801 History   First MD Initiated Contact with Patient 01/25/14 1814     Chief Complaint  Patient presents with  . Emesis  . Chest Pain     (Consider location/radiation/quality/duration/timing/severity/associated sxs/prior Treatment) HPI Patient presents to the emergency department her daughter. She reports she started having central chest pain about 9 AM this morning when she awakened. She states the pain is radiating into her left arm. She has had nausea with vomiting. She's also had diaphoresis. She denies any shortness of breath. She states her current pain as a 5/10. Patient states she has one stent that was placed at [facility] several years ago. She states she is still taking her Plavix. However patient did not bring her medications with her.  Patient reports her son died 2 weeks ago from an acute MI.  [facility] Larina Bras [facility]  Past Medical History  Diagnosis Date  . History of MI (myocardial infarction)     PCI done @ [facility] (cannot get cath report on Care Everywhere(  . Hypertension   . TIA (transient ischemic attack)   . Thyroid disease   . Anxiety   . Diabetes mellitus type 2 with peripheral artery disease   . PAD (peripheral artery disease)    Past Surgical History  Procedure Laterality Date  . Cardiac catheterization      PCI - unknown vessel or stent; unknown date; @ [facility].  . Abdominal hysterectomy    . Cholecystectomy     Family History  Problem Relation Age of Onset  . Heart attack Mother   . Heart disease Mother   . Heart attack Father   . Heart disease Father    History  Substance Use Topics  . Smoking status: Never Smoker   . Smokeless tobacco: Never Used  . Alcohol Use: No   Lives with daughter  OB History   Grav Para Term Preterm Abortions TAB SAB Ect Mult Living                 Review of Systems  All other systems reviewed and are  negative.     Allergies  Review of patient's allergies indicates no known allergies.  Home Medications   Prior to Admission medications   Medication Sig Start Date End Date Taking? Authorizing Provider  ALPRAZolam Prudy Feeler) 1 MG tablet Take 1 mg by mouth 4 (four) times daily.   Yes Historical Provider, MD  amitriptyline (ELAVIL) 25 MG tablet Take 25 mg by mouth at bedtime.   Yes Historical Provider, MD  amLODipine (NORVASC) 5 MG tablet Take 5 mg by mouth daily.   Yes Historical Provider, MD  aspirin EC 81 MG tablet Take 81 mg by mouth daily.   Yes Historical Provider, MD  hydrochlorothiazide (MICROZIDE) 12.5 MG capsule Take 12.5 mg by mouth daily.   Yes Historical Provider, MD  HYDROcodone-acetaminophen (NORCO) 10-325 MG per tablet Take 1 tablet by mouth every 6 (six) hours as needed. Pain   Yes Historical Provider, MD  insulin glargine (LANTUS) 100 UNIT/ML injection Inject 60 Units into the skin at bedtime.   Yes Historical Provider, MD  levothyroxine (SYNTHROID, LEVOTHROID) 75 MCG tablet Take 75 mcg by mouth daily before breakfast.   Yes Historical Provider, MD  lovastatin (MEVACOR) 40 MG tablet Take 40 mg by mouth at bedtime.   Yes Historical Provider, MD  metoprolol succinate (TOPROL-XL) 50 MG 24 hr  tablet Take 50 mg by mouth daily. Take with or immediately following a meal.   Yes Historical Provider, MD  omeprazole (PRILOSEC) 20 MG capsule Take 20 mg by mouth daily.   Yes Historical Provider, MD  quinapril (ACCUPRIL) 40 MG tablet Take 40 mg by mouth daily.   Yes Historical Provider, MD   BP 117/78  Pulse 99  Temp(Src) 97.4 F (36.3 C) (Oral)  Resp 28  Ht 5\' 9"  (1.753 m)  Wt 180 lb (81.647 kg)  BMI 26.57 kg/m2  SpO2 97%  Vital signs normal   Physical Exam  Nursing note and vitals reviewed. Constitutional: She is oriented to person, place, and time. She appears well-developed and well-nourished.  Non-toxic appearance. She does not appear ill. She appears distressed.  Having  dry heaves  HENT:  Head: Normocephalic and atraumatic.  Right Ear: External ear normal.  Left Ear: External ear normal.  Nose: Nose normal. No mucosal edema or rhinorrhea.  Mouth/Throat: Oropharynx is clear and moist and mucous membranes are normal. No dental abscesses or uvula swelling.  Eyes: Conjunctivae and EOM are normal. Pupils are equal, round, and reactive to light.  Neck: Normal range of motion and full passive range of motion without pain. Neck supple.  Cardiovascular: Normal rate, regular rhythm and normal heart sounds.  Exam reveals no gallop and no friction rub.   No murmur heard. Pulmonary/Chest: Effort normal and breath sounds normal. No respiratory distress. She has no wheezes. She has no rhonchi. She has no rales. She exhibits no tenderness and no crepitus.  Abdominal: Soft. Normal appearance and bowel sounds are normal. She exhibits no distension. There is no tenderness. There is no rebound and no guarding.  Musculoskeletal: Normal range of motion. She exhibits no edema and no tenderness.  Moves all extremities well.   Neurological: She is alert and oriented to person, place, and time. She has normal strength. No cranial nerve deficit.  Skin: Skin is warm, dry and intact. No rash noted. No erythema. There is pallor.  Psychiatric: Her speech is normal and behavior is normal. Her mood appears not anxious.  Flat affect    ED Course  Procedures (including critical care time)  Medications  0.9 %  sodium chloride infusion (20 mL/hr Intravenous New Bag/Given 01/25/14 1847)  aspirin chewable tablet 324 mg (324 mg Oral Given 01/25/14 1844)  heparin injection 4,000 Units (4,000 Units Intravenous Given 01/25/14 1845)  morphine 4 MG/ML injection 4 mg (4 mg Intravenous Given 01/25/14 1844)  ondansetron (ZOFRAN) injection 4 mg (4 mg Intravenous Given 01/25/14 1847)  amiodarone (CORDARONE) 150 MG/3ML injection (150 mg  Given 01/25/14 1853)  Amiodarone drip at 30 mg/hr  1820 code Stemi  called  18:29 Dr Excell Seltzer accepts patient in transfer.States Dr Herbie Baltimore will be doing her catheterization. EMS contacted to transport patient.   IV heparin, aspirin, morphine, and Zofran was ordered. Order for code STEMI was done after it actually had been called.  18:40 patient had a long stretch of V. tach however she remained awake and conscious. It spontaneously resolved and then she had another shorter episode of V. tach, but a shorter episode that spontaneously resolved and then she had a even shorter episode that spontaneously resolved. Crash cart was placed in her room and defibrillator pads were placed on patient. Amiodarone was ordered.   At 1842 patient was noted to have ventricular bigeminy.  1843 Patient was given amiodarone 150 mg bolus and started on a drip at 30 mg per hour.  18:48 EMS here, they were given copies of her rhythm strips showing the V tach.   I looked in Care Everywhere and the last visit was in 2013 and at that time she was on plavix.   18:57 Dr Herbie BaltimoreHarding was notified about her V tach.   19:10 EMS is leaving out the door with patient.  She remains in NSR.     Labs Review Results for orders placed during the hospital encounter of 01/25/14  APTT      Result Value Ref Range   aPTT 30  24 - 37 seconds  CBC      Result Value Ref Range   WBC 12.2 (*) 4.0 - 10.5 K/uL   RBC 4.68  3.87 - 5.11 MIL/uL   Hemoglobin 14.6  12.0 - 15.0 g/dL   HCT 16.141.0  09.636.0 - 04.546.0 %   MCV 87.6  78.0 - 100.0 fL   MCH 31.2  26.0 - 34.0 pg   MCHC 35.6  30.0 - 36.0 g/dL   RDW 40.913.0  81.111.5 - 91.415.5 %   Platelets 272  150 - 400 K/uL  COMPREHENSIVE METABOLIC PANEL      Result Value Ref Range   Sodium 138  137 - 147 mEq/L   Potassium 4.1  3.7 - 5.3 mEq/L   Chloride 97  96 - 112 mEq/L   CO2 24  19 - 32 mEq/L   Glucose, Bld 371 (*) 70 - 99 mg/dL   BUN 15  6 - 23 mg/dL   Creatinine, Ser 7.820.89  0.50 - 1.10 mg/dL   Calcium 9.5  8.4 - 95.610.5 mg/dL   Total Protein 8.6 (*) 6.0 - 8.3 g/dL    Albumin 3.9  3.5 - 5.2 g/dL   AST 19  0 - 37 U/L   ALT 11  0 - 35 U/L   Alkaline Phosphatase 83  39 - 117 U/L   Total Bilirubin 0.7  0.3 - 1.2 mg/dL   GFR calc non Af Amer 62 (*) >90 mL/min   GFR calc Af Amer 72 (*) >90 mL/min   Anion gap 17 (*) 5 - 15  PROTIME-INR      Result Value Ref Range   Prothrombin Time 13.7  11.6 - 15.2 seconds   INR 1.05  0.00 - 1.49  TROPONIN I      Result Value Ref Range   Troponin I 1.86 (*) <0.30 ng/mL    Laboratory interpretation all normal except + troponin    Imaging Review No results found.   EKG Interpretation   Date/Time:  Sunday January 25 2014 18:18:59 EDT Ventricular Rate:  100 PR Interval:  155 QRS Duration: 115 QT Interval:  349 QTC Calculation: 450 R Axis:   7 Text Interpretation:  Sinus tachycardia Consider left atrial enlargement  Nonspecific intraventricular conduction delay Inferior infarct, acute  (RCA) Anterior infarct, old Lateral leads are also involved Probable RV  involvement, suggest recording right precordial leads No old tracing to  compare Confirmed by Kayse Puccini  MD-I, Delos Klich ([id number]) on 01/25/2014 6:34:43 PM      MDM   Final diagnoses:  ST elevation myocardial infarction (STEMI), unspecified artery  Acute MI inferior lateral first episode care    [facility]  Devoria AlbeIva Jamaurie Bernier, MD, FACEP   CRITICAL CARE Performed by: Devoria AlbeKNAPP,Kishana Battey L Total critical care time: 33 min Critical care time was exclusive of separately billable procedures and treating other patients. Critical care was necessary to treat or prevent imminent  or life-threatening deterioration. Critical care was time spent personally by me on the following activities: development of treatment plan with patient and/or surrogate as well as nursing, discussions with consultants, evaluation of patient's response to treatment, examination of patient, obtaining history from patient or surrogate, ordering and performing treatments and interventions, ordering and  review of laboratory studies, ordering and review of radiographic studies, pulse oximetry and re-evaluation of patient's condition.       Ward Givens, MD 01/25/14 2000

## 2014-01-26 ENCOUNTER — Emergency Department (HOSPITAL_COMMUNITY): Payer: Medicare Other

## 2014-01-26 ENCOUNTER — Encounter (HOSPITAL_COMMUNITY): Payer: Self-pay | Admitting: Certified Registered Nurse Anesthetist

## 2014-01-26 ENCOUNTER — Inpatient Hospital Stay (HOSPITAL_COMMUNITY): Payer: Medicare Other

## 2014-01-26 DIAGNOSIS — J9600 Acute respiratory failure, unspecified whether with hypoxia or hypercapnia: Secondary | ICD-10-CM | POA: Diagnosis not present

## 2014-01-26 DIAGNOSIS — I252 Old myocardial infarction: Secondary | ICD-10-CM | POA: Diagnosis not present

## 2014-01-26 DIAGNOSIS — F411 Generalized anxiety disorder: Secondary | ICD-10-CM | POA: Diagnosis present

## 2014-01-26 DIAGNOSIS — D6959 Other secondary thrombocytopenia: Secondary | ICD-10-CM | POA: Diagnosis not present

## 2014-01-26 DIAGNOSIS — E039 Hypothyroidism, unspecified: Secondary | ICD-10-CM | POA: Diagnosis present

## 2014-01-26 DIAGNOSIS — E46 Unspecified protein-calorie malnutrition: Secondary | ICD-10-CM | POA: Diagnosis not present

## 2014-01-26 DIAGNOSIS — Z9089 Acquired absence of other organs: Secondary | ICD-10-CM | POA: Diagnosis not present

## 2014-01-26 DIAGNOSIS — N170 Acute kidney failure with tubular necrosis: Secondary | ICD-10-CM | POA: Diagnosis not present

## 2014-01-26 DIAGNOSIS — J95822 Acute and chronic postprocedural respiratory failure: Secondary | ICD-10-CM | POA: Diagnosis not present

## 2014-01-26 DIAGNOSIS — Z951 Presence of aortocoronary bypass graft: Secondary | ICD-10-CM

## 2014-01-26 DIAGNOSIS — I509 Heart failure, unspecified: Secondary | ICD-10-CM | POA: Diagnosis not present

## 2014-01-26 DIAGNOSIS — Z7982 Long term (current) use of aspirin: Secondary | ICD-10-CM | POA: Diagnosis not present

## 2014-01-26 DIAGNOSIS — Z9861 Coronary angioplasty status: Secondary | ICD-10-CM | POA: Diagnosis not present

## 2014-01-26 DIAGNOSIS — IMO0002 Reserved for concepts with insufficient information to code with codable children: Secondary | ICD-10-CM | POA: Diagnosis not present

## 2014-01-26 DIAGNOSIS — I059 Rheumatic mitral valve disease, unspecified: Secondary | ICD-10-CM | POA: Diagnosis present

## 2014-01-26 DIAGNOSIS — I701 Atherosclerosis of renal artery: Secondary | ICD-10-CM | POA: Diagnosis present

## 2014-01-26 DIAGNOSIS — I129 Hypertensive chronic kidney disease with stage 1 through stage 4 chronic kidney disease, or unspecified chronic kidney disease: Secondary | ICD-10-CM | POA: Diagnosis present

## 2014-01-26 DIAGNOSIS — E669 Obesity, unspecified: Secondary | ICD-10-CM | POA: Diagnosis present

## 2014-01-26 DIAGNOSIS — Z8673 Personal history of transient ischemic attack (TIA), and cerebral infarction without residual deficits: Secondary | ICD-10-CM | POA: Diagnosis not present

## 2014-01-26 DIAGNOSIS — J9819 Other pulmonary collapse: Secondary | ICD-10-CM | POA: Diagnosis not present

## 2014-01-26 DIAGNOSIS — I4729 Other ventricular tachycardia: Secondary | ICD-10-CM | POA: Diagnosis present

## 2014-01-26 DIAGNOSIS — R079 Chest pain, unspecified: Secondary | ICD-10-CM | POA: Diagnosis present

## 2014-01-26 DIAGNOSIS — E785 Hyperlipidemia, unspecified: Secondary | ICD-10-CM | POA: Diagnosis present

## 2014-01-26 DIAGNOSIS — I219 Acute myocardial infarction, unspecified: Secondary | ICD-10-CM | POA: Diagnosis not present

## 2014-01-26 DIAGNOSIS — I472 Ventricular tachycardia: Secondary | ICD-10-CM | POA: Diagnosis present

## 2014-01-26 DIAGNOSIS — I4891 Unspecified atrial fibrillation: Secondary | ICD-10-CM | POA: Diagnosis not present

## 2014-01-26 DIAGNOSIS — Y9210 Unspecified residential institution as the place of occurrence of the external cause: Secondary | ICD-10-CM | POA: Diagnosis not present

## 2014-01-26 DIAGNOSIS — G92 Toxic encephalopathy: Secondary | ICD-10-CM | POA: Diagnosis not present

## 2014-01-26 DIAGNOSIS — I2510 Atherosclerotic heart disease of native coronary artery without angina pectoris: Secondary | ICD-10-CM | POA: Diagnosis present

## 2014-01-26 DIAGNOSIS — I5033 Acute on chronic diastolic (congestive) heart failure: Secondary | ICD-10-CM | POA: Diagnosis not present

## 2014-01-26 DIAGNOSIS — I70209 Unspecified atherosclerosis of native arteries of extremities, unspecified extremity: Secondary | ICD-10-CM | POA: Diagnosis present

## 2014-01-26 DIAGNOSIS — I2582 Chronic total occlusion of coronary artery: Secondary | ICD-10-CM | POA: Diagnosis present

## 2014-01-26 DIAGNOSIS — R570 Cardiogenic shock: Secondary | ICD-10-CM | POA: Diagnosis not present

## 2014-01-26 DIAGNOSIS — D62 Acute posthemorrhagic anemia: Secondary | ICD-10-CM | POA: Diagnosis not present

## 2014-01-26 DIAGNOSIS — Z8249 Family history of ischemic heart disease and other diseases of the circulatory system: Secondary | ICD-10-CM | POA: Diagnosis not present

## 2014-01-26 DIAGNOSIS — N185 Chronic kidney disease, stage 5: Secondary | ICD-10-CM | POA: Diagnosis not present

## 2014-01-26 DIAGNOSIS — I519 Heart disease, unspecified: Secondary | ICD-10-CM | POA: Diagnosis not present

## 2014-01-26 DIAGNOSIS — E876 Hypokalemia: Secondary | ICD-10-CM | POA: Diagnosis not present

## 2014-01-26 DIAGNOSIS — R34 Anuria and oliguria: Secondary | ICD-10-CM | POA: Diagnosis not present

## 2014-01-26 DIAGNOSIS — E871 Hypo-osmolality and hyponatremia: Secondary | ICD-10-CM | POA: Diagnosis not present

## 2014-01-26 DIAGNOSIS — R1312 Dysphagia, oropharyngeal phase: Secondary | ICD-10-CM | POA: Diagnosis not present

## 2014-01-26 DIAGNOSIS — I4892 Unspecified atrial flutter: Secondary | ICD-10-CM | POA: Diagnosis not present

## 2014-01-26 DIAGNOSIS — E872 Acidosis, unspecified: Secondary | ICD-10-CM | POA: Diagnosis not present

## 2014-01-26 DIAGNOSIS — I2119 ST elevation (STEMI) myocardial infarction involving other coronary artery of inferior wall: Secondary | ICD-10-CM | POA: Diagnosis present

## 2014-01-26 DIAGNOSIS — Z6838 Body mass index (BMI) 38.0-38.9, adult: Secondary | ICD-10-CM | POA: Diagnosis not present

## 2014-01-26 DIAGNOSIS — J189 Pneumonia, unspecified organism: Secondary | ICD-10-CM | POA: Diagnosis not present

## 2014-01-26 DIAGNOSIS — Y832 Surgical operation with anastomosis, bypass or graft as the cause of abnormal reaction of the patient, or of later complication, without mention of misadventure at the time of the procedure: Secondary | ICD-10-CM | POA: Diagnosis not present

## 2014-01-26 DIAGNOSIS — Z794 Long term (current) use of insulin: Secondary | ICD-10-CM | POA: Diagnosis not present

## 2014-01-26 DIAGNOSIS — I2789 Other specified pulmonary heart diseases: Secondary | ICD-10-CM | POA: Diagnosis present

## 2014-01-26 DIAGNOSIS — T502X5A Adverse effect of carbonic-anhydrase inhibitors, benzothiadiazides and other diuretics, initial encounter: Secondary | ICD-10-CM | POA: Diagnosis not present

## 2014-01-26 DIAGNOSIS — Z7901 Long term (current) use of anticoagulants: Secondary | ICD-10-CM | POA: Diagnosis not present

## 2014-01-26 DIAGNOSIS — E1159 Type 2 diabetes mellitus with other circulatory complications: Secondary | ICD-10-CM | POA: Diagnosis present

## 2014-01-26 DIAGNOSIS — Z79899 Other long term (current) drug therapy: Secondary | ICD-10-CM | POA: Diagnosis not present

## 2014-01-26 DIAGNOSIS — I498 Other specified cardiac arrhythmias: Secondary | ICD-10-CM | POA: Diagnosis present

## 2014-01-26 DIAGNOSIS — M6250 Muscle wasting and atrophy, not elsewhere classified, unspecified site: Secondary | ICD-10-CM | POA: Diagnosis not present

## 2014-01-26 DIAGNOSIS — J90 Pleural effusion, not elsewhere classified: Secondary | ICD-10-CM | POA: Diagnosis present

## 2014-01-26 DIAGNOSIS — F0390 Unspecified dementia without behavioral disturbance: Secondary | ICD-10-CM | POA: Diagnosis present

## 2014-01-26 DIAGNOSIS — G929 Unspecified toxic encephalopathy: Secondary | ICD-10-CM | POA: Diagnosis not present

## 2014-01-26 DIAGNOSIS — N189 Chronic kidney disease, unspecified: Secondary | ICD-10-CM | POA: Diagnosis present

## 2014-01-26 LAB — POCT I-STAT, CHEM 8
BUN: 15 mg/dL (ref 6–23)
BUN: 12 mg/dL (ref 6–23)
BUN: 14 mg/dL (ref 6–23)
BUN: 13 mg/dL (ref 6–23)
BUN: 12 mg/dL (ref 6–23)
BUN: 14 mg/dL (ref 6–23)
BUN: 14 mg/dL (ref 6–23)
BUN: 12 mg/dL (ref 6–23)
BUN: 11 mg/dL (ref 6–23)
Calcium, Ion: 1.2 mmol/L (ref 1.13–1.30)
Calcium, Ion: 1.01 mmol/L — ABNORMAL LOW (ref 1.13–1.30)
Calcium, Ion: 1.17 mmol/L (ref 1.13–1.30)
Calcium, Ion: 0.95 mmol/L — ABNORMAL LOW (ref 1.13–1.30)
Calcium, Ion: 1.34 mmol/L — ABNORMAL HIGH (ref 1.13–1.30)
Calcium, Ion: 1.36 mmol/L — ABNORMAL HIGH (ref 1.13–1.30)
Calcium, Ion: 1.21 mmol/L (ref 1.13–1.30)
Calcium, Ion: 1.27 mmol/L (ref 1.13–1.30)
Calcium, Ion: 1.18 mmol/L (ref 1.13–1.30)
Chloride: 102 mEq/L (ref 96–112)
Chloride: 102 mEq/L (ref 96–112)
Chloride: 97 mEq/L (ref 96–112)
Chloride: 98 mEq/L (ref 96–112)
Chloride: 100 mEq/L (ref 96–112)
Chloride: 102 mEq/L (ref 96–112)
Chloride: 102 mEq/L (ref 96–112)
Chloride: 101 mEq/L (ref 96–112)
Chloride: 102 mEq/L (ref 96–112)
Creatinine, Ser: 0.8 mg/dL (ref 0.50–1.10)
Creatinine, Ser: 0.7 mg/dL (ref 0.50–1.10)
Creatinine, Ser: 0.8 mg/dL (ref 0.50–1.10)
Creatinine, Ser: 0.7 mg/dL (ref 0.50–1.10)
Creatinine, Ser: 0.7 mg/dL (ref 0.50–1.10)
Creatinine, Ser: 0.8 mg/dL (ref 0.50–1.10)
Creatinine, Ser: 0.8 mg/dL (ref 0.50–1.10)
Creatinine, Ser: 0.7 mg/dL (ref 0.50–1.10)
Creatinine, Ser: 1 mg/dL (ref 0.50–1.10)
Glucose, Bld: 456 mg/dL — ABNORMAL HIGH (ref 70–99)
Glucose, Bld: 346 mg/dL — ABNORMAL HIGH (ref 70–99)
Glucose, Bld: 404 mg/dL — ABNORMAL HIGH (ref 70–99)
Glucose, Bld: 251 mg/dL — ABNORMAL HIGH (ref 70–99)
Glucose, Bld: 377 mg/dL — ABNORMAL HIGH (ref 70–99)
Glucose, Bld: 378 mg/dL — ABNORMAL HIGH (ref 70–99)
Glucose, Bld: 312 mg/dL — ABNORMAL HIGH (ref 70–99)
Glucose, Bld: 320 mg/dL — ABNORMAL HIGH (ref 70–99)
Glucose, Bld: 264 mg/dL — ABNORMAL HIGH (ref 70–99)
HCT: 39 % (ref 36.0–46.0)
HCT: 27 % — ABNORMAL LOW (ref 36.0–46.0)
HCT: 38 % (ref 36.0–46.0)
HCT: 21 % — ABNORMAL LOW (ref 36.0–46.0)
HCT: 26 % — ABNORMAL LOW (ref 36.0–46.0)
HCT: 26 % — ABNORMAL LOW (ref 36.0–46.0)
HCT: 26 % — ABNORMAL LOW (ref 36.0–46.0)
HCT: 26 % — ABNORMAL LOW (ref 36.0–46.0)
HCT: 27 % — ABNORMAL LOW (ref 36.0–46.0)
Hemoglobin: 13.3 g/dL (ref 12.0–15.0)
Hemoglobin: 12.9 g/dL (ref 12.0–15.0)
Hemoglobin: 9.2 g/dL — ABNORMAL LOW (ref 12.0–15.0)
Hemoglobin: 7.1 g/dL — ABNORMAL LOW (ref 12.0–15.0)
Hemoglobin: 8.8 g/dL — ABNORMAL LOW (ref 12.0–15.0)
Hemoglobin: 8.8 g/dL — ABNORMAL LOW (ref 12.0–15.0)
Hemoglobin: 8.8 g/dL — ABNORMAL LOW (ref 12.0–15.0)
Hemoglobin: 8.8 g/dL — ABNORMAL LOW (ref 12.0–15.0)
Hemoglobin: 9.2 g/dL — ABNORMAL LOW (ref 12.0–15.0)
Potassium: 4.5 mEq/L (ref 3.7–5.3)
Potassium: 3.7 mEq/L (ref 3.7–5.3)
Potassium: 3.7 mEq/L (ref 3.7–5.3)
Potassium: 4.6 mEq/L (ref 3.7–5.3)
Potassium: 3.7 mEq/L (ref 3.7–5.3)
Potassium: 4 mEq/L (ref 3.7–5.3)
Potassium: 4.9 mEq/L (ref 3.7–5.3)
Potassium: 3.9 mEq/L (ref 3.7–5.3)
Potassium: 3 mEq/L — ABNORMAL LOW (ref 3.7–5.3)
Sodium: 141 mEq/L (ref 137–147)
Sodium: 139 mEq/L (ref 137–147)
Sodium: 140 mEq/L (ref 137–147)
Sodium: 141 mEq/L (ref 137–147)
Sodium: 140 mEq/L (ref 137–147)
Sodium: 141 mEq/L (ref 137–147)
Sodium: 143 mEq/L (ref 137–147)
Sodium: 144 mEq/L (ref 137–147)
Sodium: 143 mEq/L (ref 137–147)
TCO2: 24 mmol/L (ref 0–100)
TCO2: 20 mmol/L (ref 0–100)
TCO2: 21 mmol/L (ref 0–100)
TCO2: 25 mmol/L (ref 0–100)
TCO2: 22 mmol/L (ref 0–100)
TCO2: 24 mmol/L (ref 0–100)
TCO2: 23 mmol/L (ref 0–100)
TCO2: 28 mmol/L (ref 0–100)
TCO2: 19 mmol/L (ref 0–100)

## 2014-01-26 LAB — BASIC METABOLIC PANEL
Anion gap: 19 — ABNORMAL HIGH (ref 5–15)
BUN: 12 mg/dL (ref 6–23)
CO2: 20 mEq/L (ref 19–32)
Calcium: 7.8 mg/dL — ABNORMAL LOW (ref 8.4–10.5)
Chloride: 106 mEq/L (ref 96–112)
Creatinine, Ser: 0.86 mg/dL (ref 0.50–1.10)
GFR calc Af Amer: 75 mL/min — ABNORMAL LOW (ref >90)
GFR calc non Af Amer: 65 mL/min — ABNORMAL LOW (ref >90)
GLUCOSE: 252 mg/dL — AB (ref 70–99)
POTASSIUM: 3 meq/L — AB (ref 3.7–5.3)
SODIUM: 145 meq/L (ref 137–147)

## 2014-01-26 LAB — HEMOGLOBIN AND HEMATOCRIT, BLOOD
HCT: 19.8 % — ABNORMAL LOW (ref 36.0–46.0)
HCT: 24.3 % — ABNORMAL LOW (ref 36.0–46.0)
Hemoglobin: 6.7 g/dL — CL (ref 12.0–15.0)
Hemoglobin: 8.4 g/dL — ABNORMAL LOW (ref 12.0–15.0)

## 2014-01-26 LAB — POCT I-STAT 3, ART BLOOD GAS (G3+)
Acid-base deficit: 5 mmol/L — ABNORMAL HIGH (ref 0.0–2.0)
Acid-base deficit: 5 mmol/L — ABNORMAL HIGH (ref 0.0–2.0)
Acid-base deficit: 2 mmol/L (ref 0.0–2.0)
Acid-base deficit: 4 mmol/L — ABNORMAL HIGH (ref 0.0–2.0)
Acid-base deficit: 3 mmol/L — ABNORMAL HIGH (ref 0.0–2.0)
Acid-base deficit: 4 mmol/L — ABNORMAL HIGH (ref 0.0–2.0)
Acid-base deficit: 5 mmol/L — ABNORMAL HIGH (ref 0.0–2.0)
Bicarbonate: 20.8 mEq/L (ref 20.0–24.0)
Bicarbonate: 20.8 mEq/L (ref 20.0–24.0)
Bicarbonate: 21.8 mEq/L (ref 20.0–24.0)
Bicarbonate: 23.4 mEq/L (ref 20.0–24.0)
Bicarbonate: 21.9 mEq/L (ref 20.0–24.0)
Bicarbonate: 22.8 mEq/L (ref 20.0–24.0)
Bicarbonate: 20.6 mEq/L (ref 20.0–24.0)
O2 Saturation: 100 %
O2 Saturation: 100 %
O2 Saturation: 100 %
O2 Saturation: 100 %
O2 Saturation: 100 %
O2 Saturation: 91 %
O2 Saturation: 99 %
Patient temperature: 35.7
Patient temperature: 38
TCO2: 22 mmol/L (ref 0–100)
TCO2: 22 mmol/L (ref 0–100)
TCO2: 23 mmol/L (ref 0–100)
TCO2: 25 mmol/L (ref 0–100)
TCO2: 23 mmol/L (ref 0–100)
TCO2: 24 mmol/L (ref 0–100)
TCO2: 22 mmol/L (ref 0–100)
pCO2 arterial: 42.5 mmHg (ref 35.0–45.0)
pCO2 arterial: 41.2 mmHg (ref 35.0–45.0)
pCO2 arterial: 42.4 mmHg (ref 35.0–45.0)
pCO2 arterial: 44.5 mmHg (ref 35.0–45.0)
pCO2 arterial: 38.6 mmHg (ref 35.0–45.0)
pCO2 arterial: 47.1 mmHg — ABNORMAL HIGH (ref 35.0–45.0)
pCO2 arterial: 39.1 mmHg (ref 35.0–45.0)
pH, Arterial: 7.297 — ABNORMAL LOW (ref 7.350–7.450)
pH, Arterial: 7.311 — ABNORMAL LOW (ref 7.350–7.450)
pH, Arterial: 7.319 — ABNORMAL LOW (ref 7.350–7.450)
pH, Arterial: 7.329 — ABNORMAL LOW (ref 7.350–7.450)
pH, Arterial: 7.361 (ref 7.350–7.450)
pH, Arterial: 7.287 — ABNORMAL LOW (ref 7.350–7.450)
pH, Arterial: 7.335 — ABNORMAL LOW (ref 7.350–7.450)
pO2, Arterial: 281 mmHg — ABNORMAL HIGH (ref 80.0–100.0)
pO2, Arterial: 246 mmHg — ABNORMAL HIGH (ref 80.0–100.0)
pO2, Arterial: 275 mmHg — ABNORMAL HIGH (ref 80.0–100.0)
pO2, Arterial: 316 mmHg — ABNORMAL HIGH (ref 80.0–100.0)
pO2, Arterial: 306 mmHg — ABNORMAL HIGH (ref 80.0–100.0)
pO2, Arterial: 64 mmHg — ABNORMAL LOW (ref 80.0–100.0)
pO2, Arterial: 166 mmHg — ABNORMAL HIGH (ref 80.0–100.0)

## 2014-01-26 LAB — HEMOGLOBIN A1C
Hgb A1c MFr Bld: 10.2 % — ABNORMAL HIGH (ref <5.7)
Hgb A1c MFr Bld: 8.4 % — ABNORMAL HIGH (ref <5.7)
MEAN PLASMA GLUCOSE: 194 mg/dL — AB (ref <117)
Mean Plasma Glucose: 246 mg/dL — ABNORMAL HIGH (ref <117)

## 2014-01-26 LAB — CBC WITH DIFFERENTIAL/PLATELET
BASOS ABS: 0 10*3/uL (ref 0.0–0.1)
BASOS PCT: 0 % (ref 0–1)
Eosinophils Absolute: 0 10*3/uL (ref 0.0–0.7)
Eosinophils Relative: 0 % (ref 0–5)
HCT: 31.8 % — ABNORMAL LOW (ref 36.0–46.0)
HEMOGLOBIN: 10.6 g/dL — AB (ref 12.0–15.0)
LYMPHS PCT: 14 % (ref 12–46)
Lymphs Abs: 2.7 10*3/uL (ref 0.7–4.0)
MCH: 29.1 pg (ref 26.0–34.0)
MCHC: 33.3 g/dL (ref 30.0–36.0)
MCV: 87.4 fL (ref 78.0–100.0)
MONOS PCT: 5 % (ref 3–12)
Monocytes Absolute: 1 10*3/uL (ref 0.1–1.0)
NEUTROS ABS: 14.9 10*3/uL — AB (ref 1.7–7.7)
NEUTROS PCT: 80 % — AB (ref 43–77)
Platelets: 130 10*3/uL — ABNORMAL LOW (ref 150–400)
RBC: 3.64 MIL/uL — ABNORMAL LOW (ref 3.87–5.11)
RDW: 14.5 % (ref 11.5–15.5)
WBC: 18.5 10*3/uL — AB (ref 4.0–10.5)

## 2014-01-26 LAB — POCT I-STAT GLUCOSE
Glucose, Bld: 271 mg/dL — ABNORMAL HIGH (ref 70–99)
Glucose, Bld: 305 mg/dL — ABNORMAL HIGH (ref 70–99)
Glucose, Bld: 316 mg/dL — ABNORMAL HIGH (ref 70–99)
Glucose, Bld: 395 mg/dL — ABNORMAL HIGH (ref 70–99)
Operator id: 147011
Operator id: 3406
Operator id: 3406
Operator id: 3406

## 2014-01-26 LAB — LIPID PANEL
CHOLESTEROL: 82 mg/dL (ref 0–200)
HDL: 20 mg/dL — ABNORMAL LOW (ref >39)
LDL Cholesterol: 39 mg/dL (ref 0–99)
TRIGLYCERIDES: 115 mg/dL (ref <150)
Total CHOL/HDL Ratio: 4.1 RATIO
VLDL: 23 mg/dL (ref 0–40)

## 2014-01-26 LAB — CARBOXYHEMOGLOBIN
Carboxyhemoglobin: 1.3 % (ref 0.5–1.5)
Methemoglobin: 0.9 % (ref 0.0–1.5)
O2 Saturation: 61.6 %
Total hemoglobin: 10.4 g/dL — ABNORMAL LOW (ref 12.0–16.0)

## 2014-01-26 LAB — MAGNESIUM
MAGNESIUM: 2.3 mg/dL (ref 1.5–2.5)
Magnesium: 2.3 mg/dL (ref 1.5–2.5)

## 2014-01-26 LAB — PROTIME-INR
INR: 1.73 — ABNORMAL HIGH (ref 0.00–1.49)
Prothrombin Time: 20.3 seconds — ABNORMAL HIGH (ref 11.6–15.2)

## 2014-01-26 LAB — CREATININE, SERUM
Creatinine, Ser: 0.98 mg/dL (ref 0.50–1.10)
GFR calc Af Amer: 64 mL/min — ABNORMAL LOW (ref >90)
GFR calc non Af Amer: 55 mL/min — ABNORMAL LOW (ref >90)

## 2014-01-26 LAB — POCT I-STAT 4, (NA,K, GLUC, HGB,HCT)
Glucose, Bld: 268 mg/dL — ABNORMAL HIGH (ref 70–99)
HCT: 33 % — ABNORMAL LOW (ref 36.0–46.0)
Hemoglobin: 11.2 g/dL — ABNORMAL LOW (ref 12.0–15.0)
Potassium: 3 mEq/L — ABNORMAL LOW (ref 3.7–5.3)
Sodium: 145 mEq/L (ref 137–147)

## 2014-01-26 LAB — CBC
HCT: 26.1 % — ABNORMAL LOW (ref 36.0–46.0)
Hemoglobin: 9 g/dL — ABNORMAL LOW (ref 12.0–15.0)
MCH: 30.2 pg (ref 26.0–34.0)
MCHC: 34.5 g/dL (ref 30.0–36.0)
MCV: 87.6 fL (ref 78.0–100.0)
Platelets: 126 10*3/uL — ABNORMAL LOW (ref 150–400)
RBC: 2.98 MIL/uL — ABNORMAL LOW (ref 3.87–5.11)
RDW: 15 % (ref 11.5–15.5)
WBC: 12.8 10*3/uL — ABNORMAL HIGH (ref 4.0–10.5)

## 2014-01-26 LAB — APTT: aPTT: 40 seconds — ABNORMAL HIGH (ref 24–37)

## 2014-01-26 LAB — PLATELET COUNT
Platelets: 85 10*3/uL — ABNORMAL LOW (ref 150–400)
Platelets: 64 10*3/uL — ABNORMAL LOW (ref 150–400)

## 2014-01-26 LAB — MRSA PCR SCREENING: MRSA by PCR: NEGATIVE

## 2014-01-26 LAB — PREPARE RBC (CROSSMATCH)

## 2014-01-26 LAB — FIBRINOGEN: Fibrinogen: 147 mg/dL — ABNORMAL LOW (ref 204–475)

## 2014-01-26 MED ORDER — SODIUM CHLORIDE 0.9 % IV SOLN
INTRAVENOUS | Status: DC
  Administered 2014-01-26: 14.7 [IU]/h via INTRAVENOUS
  Administered 2014-01-26: 26.2 [IU]/h via INTRAVENOUS
  Administered 2014-01-26: 7 [IU]/h via INTRAVENOUS
  Administered 2014-01-27: 12 [IU]/h via INTRAVENOUS
  Administered 2014-01-27: 12.9 [IU]/h via INTRAVENOUS
  Administered 2014-01-27: 10.8 [IU]/h via INTRAVENOUS
  Administered 2014-01-27: 02:00:00 via INTRAVENOUS
  Administered 2014-01-28: 8.4 [IU]/h via INTRAVENOUS
  Administered 2014-01-28: 7.7 [IU]/h via INTRAVENOUS
  Administered 2014-01-29: 21:00:00 via INTRAVENOUS
  Administered 2014-01-29: 1.8 [IU]/h via INTRAVENOUS
  Filled 2014-01-26 (×11): qty 1

## 2014-01-26 MED ORDER — MILRINONE IN DEXTROSE 20 MG/100ML IV SOLN: INTRAVENOUS | Status: DC | PRN

## 2014-01-26 MED ORDER — SODIUM CHLORIDE 0.9 % IJ SOLN: 3.0000 mL | INTRAMUSCULAR | Status: DC | PRN

## 2014-01-26 MED ORDER — PANTOPRAZOLE SODIUM 40 MG PO TBEC: 40.0000 mg | DELAYED_RELEASE_TABLET | Freq: Every day | ORAL | Status: DC

## 2014-01-26 MED ORDER — ONDANSETRON HCL 4 MG/2ML IJ SOLN
4.0000 mg | Freq: Four times a day (QID) | INTRAMUSCULAR | Status: DC | PRN
  Administered 2014-02-13 – 2014-02-20 (×2): 4 mg via INTRAVENOUS
  Filled 2014-01-26 (×3): qty 2

## 2014-01-26 MED ORDER — LACTATED RINGERS IV SOLN
INTRAVENOUS | Status: DC
  Administered 2014-01-26: 20 mL/h via INTRAVENOUS
  Administered 2014-01-26 – 2014-01-29 (×2): via INTRAVENOUS

## 2014-01-26 MED ORDER — DOCUSATE SODIUM 100 MG PO CAPS: 200.0000 mg | ORAL_CAPSULE | Freq: Every day | ORAL | Status: DC

## 2014-01-26 MED ORDER — SODIUM CHLORIDE 0.9 % IV SOLN
INTRAVENOUS | Status: DC
  Filled 2014-01-26: qty 1

## 2014-01-26 MED ORDER — BISACODYL 10 MG RE SUPP
10.0000 mg | Freq: Every day | RECTAL | Status: DC
  Administered 2014-01-31 – 2014-02-15 (×3): 10 mg via RECTAL
  Filled 2014-01-26 (×3): qty 1

## 2014-01-26 MED ORDER — POTASSIUM CHLORIDE 10 MEQ/50ML IV SOLN
10.0000 meq | INTRAVENOUS | Status: AC
  Administered 2014-01-26 (×3): 10 meq via INTRAVENOUS

## 2014-01-26 MED ORDER — ATORVASTATIN CALCIUM 80 MG PO TABS
80.0000 mg | ORAL_TABLET | Freq: Every day | ORAL | Status: DC
  Administered 2014-01-29 – 2014-02-06 (×9): 80 mg via ORAL
  Filled 2014-01-26 (×13): qty 1

## 2014-01-26 MED ORDER — AMITRIPTYLINE HCL 25 MG PO TABS
25.0000 mg | ORAL_TABLET | Freq: Every day | ORAL | Status: DC
  Filled 2014-01-26: qty 1

## 2014-01-26 MED ORDER — SODIUM CHLORIDE 0.9 % IV SOLN
250.0000 mL | INTRAVENOUS | Status: DC
  Administered 2014-02-20: 500 mL via INTRAVENOUS

## 2014-01-26 MED ORDER — MILRINONE IN DEXTROSE 20 MG/100ML IV SOLN
0.3000 ug/kg/min | INTRAVENOUS | Status: DC
  Administered 2014-01-26 – 2014-02-02 (×12): 0.3 ug/kg/min via INTRAVENOUS
  Administered 2014-02-03: 0.2 ug/kg/min via INTRAVENOUS
  Administered 2014-02-03: 0.3 ug/kg/min via INTRAVENOUS
  Administered 2014-02-04 – 2014-02-08 (×4): 0.2 ug/kg/min via INTRAVENOUS
  Filled 2014-01-26 (×22): qty 100

## 2014-01-26 MED ORDER — CALCIUM CHLORIDE 10 % IV SOLN
INTRAVENOUS | Status: DC | PRN
  Administered 2014-01-26: 1 g via INTRAVENOUS

## 2014-01-26 MED ORDER — MIDAZOLAM HCL 2 MG/2ML IJ SOLN
2.0000 mg | INTRAMUSCULAR | Status: DC | PRN
  Administered 2014-01-31 – 2014-02-02 (×6): 2 mg via INTRAVENOUS
  Filled 2014-01-26 (×6): qty 2

## 2014-01-26 MED ORDER — ALBUMIN HUMAN 5 % IV SOLN
12.5000 g | Freq: Three times a day (TID) | INTRAVENOUS | Status: AC | PRN
  Administered 2014-01-26 – 2014-01-27 (×3): 12.5 g via INTRAVENOUS
  Filled 2014-01-26 (×2): qty 250

## 2014-01-26 MED ORDER — EPINEPHRINE HCL 1 MG/ML IJ SOLN
2.0000 ug/min | INTRAVENOUS | Status: DC
  Administered 2014-01-26 – 2014-01-28 (×8): 2 ug/min via INTRAVENOUS
  Filled 2014-01-26 (×8): qty 1

## 2014-01-26 MED ORDER — DIAZEPAM 5 MG PO TABS: 5.0000 mg | ORAL_TABLET | Freq: Once | ORAL | Status: DC

## 2014-01-26 MED ORDER — ROCURONIUM BROMIDE 50 MG/5ML IV SOLN
INTRAVENOUS | Status: AC
  Filled 2014-01-26: qty 1

## 2014-01-26 MED ORDER — ASPIRIN EC 325 MG PO TBEC
325.0000 mg | DELAYED_RELEASE_TABLET | Freq: Every day | ORAL | Status: DC
Start: 2014-01-27 — End: 2014-02-10
  Administered 2014-01-27 – 2014-02-07 (×3): 325 mg via ORAL
  Filled 2014-01-26 (×15): qty 1

## 2014-01-26 MED ORDER — NOREPINEPHRINE BITARTRATE 1 MG/ML IV SOLN
2.0000 ug/min | INTRAVENOUS | Status: DC
  Administered 2014-01-26: 15 ug/min via INTRAVENOUS
  Administered 2014-01-26: 9 ug/min via INTRAVENOUS
  Filled 2014-01-26 (×3): qty 4

## 2014-01-26 MED ORDER — VANCOMYCIN HCL IN DEXTROSE 750-5 MG/150ML-% IV SOLN
750.0000 mg | Freq: Two times a day (BID) | INTRAVENOUS | Status: AC
  Administered 2014-01-26 – 2014-01-27 (×2): 750 mg via INTRAVENOUS
  Filled 2014-01-26 (×2): qty 150

## 2014-01-26 MED ORDER — AMIODARONE HCL IN DEXTROSE 360-4.14 MG/200ML-% IV SOLN
30.0000 mg/h | INTRAVENOUS | Status: DC
  Administered 2014-01-26 – 2014-02-02 (×14): 30 mg/h via INTRAVENOUS
  Filled 2014-01-26 (×29): qty 200

## 2014-01-26 MED ORDER — HEMOSTATIC AGENTS (NO CHARGE) OPTIME
TOPICAL | Status: DC | PRN
  Administered 2014-01-26: 1 via TOPICAL

## 2014-01-26 MED ORDER — ALBUMIN HUMAN 5 % IV SOLN
INTRAVENOUS | Status: DC | PRN
  Administered 2014-01-26: 10:00:00 via INTRAVENOUS

## 2014-01-26 MED ORDER — METOPROLOL TARTRATE 1 MG/ML IV SOLN: 2.5000 mg | INTRAVENOUS | Status: DC | PRN

## 2014-01-26 MED ORDER — ASPIRIN 81 MG PO CHEW
324.0000 mg | CHEWABLE_TABLET | Freq: Every day | ORAL | Status: DC
  Administered 2014-01-28 – 2014-02-05 (×9): 324 mg
  Filled 2014-01-26 (×11): qty 4

## 2014-01-26 MED ORDER — PROTAMINE SULFATE 10 MG/ML IV SOLN
INTRAVENOUS | Status: DC | PRN
  Administered 2014-01-26: 40 mg via INTRAVENOUS
  Administered 2014-01-26: 20 mg via INTRAVENOUS
  Administered 2014-01-26 (×2): 40 mg via INTRAVENOUS
  Administered 2014-01-26: 20 mg via INTRAVENOUS
  Administered 2014-01-26: 50 mg via INTRAVENOUS
  Administered 2014-01-26: 40 mg via INTRAVENOUS

## 2014-01-26 MED ORDER — BISACODYL 5 MG PO TBEC: 5.0000 mg | DELAYED_RELEASE_TABLET | Freq: Once | ORAL | Status: DC

## 2014-01-26 MED ORDER — ASPIRIN EC 81 MG PO TBEC: 81.0000 mg | DELAYED_RELEASE_TABLET | Freq: Every day | ORAL | Status: DC

## 2014-01-26 MED ORDER — ACETAMINOPHEN 160 MG/5ML PO SOLN
1000.0000 mg | Freq: Four times a day (QID) | ORAL | Status: AC
  Administered 2014-01-26 – 2014-01-31 (×18): 1000 mg
  Filled 2014-01-26 (×17): qty 40.6

## 2014-01-26 MED ORDER — SIMVASTATIN 20 MG PO TABS: 20.0000 mg | ORAL_TABLET | Freq: Every day | ORAL | Status: DC

## 2014-01-26 MED ORDER — MIDAZOLAM HCL 2 MG/2ML IJ SOLN
INTRAMUSCULAR | Status: AC
  Filled 2014-01-26: qty 2

## 2014-01-26 MED ORDER — METOPROLOL TARTRATE 12.5 MG HALF TABLET
12.5000 mg | ORAL_TABLET | Freq: Two times a day (BID) | ORAL | Status: DC
  Filled 2014-01-26 (×12): qty 1

## 2014-01-26 MED ORDER — SODIUM CHLORIDE 0.9 % IV SOLN
INTRAVENOUS | Status: DC
  Filled 2014-01-26: qty 40

## 2014-01-26 MED ORDER — DEXMEDETOMIDINE HCL IN NACL 200 MCG/50ML IV SOLN
0.1000 ug/kg/h | INTRAVENOUS | Status: DC
  Administered 2014-01-26: 0.4 ug/kg/h via INTRAVENOUS
  Administered 2014-01-26 (×2): 0.7 ug/kg/h via INTRAVENOUS
  Administered 2014-01-27: 0.3 ug/kg/h via INTRAVENOUS
  Administered 2014-01-27: 0.4 ug/kg/h via INTRAVENOUS
  Filled 2014-01-26 (×5): qty 50

## 2014-01-26 MED ORDER — MORPHINE SULFATE 2 MG/ML IJ SOLN
2.0000 mg | INTRAMUSCULAR | Status: DC | PRN
  Administered 2014-01-30 – 2014-02-03 (×8): 2 mg via INTRAVENOUS
  Filled 2014-01-26 (×10): qty 1

## 2014-01-26 MED ORDER — DEXTROSE 5 % IV SOLN
1.5000 g | Freq: Two times a day (BID) | INTRAVENOUS | Status: AC
  Administered 2014-01-26 – 2014-01-27 (×4): 1.5 g via INTRAVENOUS
  Filled 2014-01-26 (×4): qty 1.5

## 2014-01-26 MED ORDER — METOCLOPRAMIDE HCL 5 MG/ML IJ SOLN
10.0000 mg | Freq: Four times a day (QID) | INTRAMUSCULAR | Status: AC
  Administered 2014-01-26 – 2014-01-27 (×4): 10 mg via INTRAVENOUS
  Filled 2014-01-26 (×3): qty 2

## 2014-01-26 MED ORDER — VECURONIUM BROMIDE 10 MG IV SOLR
INTRAVENOUS | Status: AC
  Filled 2014-01-26: qty 10

## 2014-01-26 MED ORDER — PHENYLEPHRINE HCL 10 MG/ML IJ SOLN
0.0000 ug/min | INTRAVENOUS | Status: DC
  Administered 2014-01-26: 20 ug/min via INTRAVENOUS
  Filled 2014-01-26 (×2): qty 2

## 2014-01-26 MED ORDER — CHLORHEXIDINE GLUCONATE 0.12 % MT SOLN
15.0000 mL | Freq: Two times a day (BID) | OROMUCOSAL | Status: DC
  Administered 2014-01-26 – 2014-02-11 (×31): 15 mL via OROMUCOSAL
  Filled 2014-01-26 (×31): qty 15

## 2014-01-26 MED ORDER — NITROGLYCERIN IN D5W 200-5 MCG/ML-% IV SOLN: 0.0000 ug/min | INTRAVENOUS | Status: DC

## 2014-01-26 MED ORDER — AMIODARONE HCL IN DEXTROSE 360-4.14 MG/200ML-% IV SOLN
60.0000 mg/h | INTRAVENOUS | Status: AC
  Administered 2014-01-26: 30 mg/h via INTRAVENOUS
  Filled 2014-01-26: qty 200

## 2014-01-26 MED ORDER — INSULIN REGULAR BOLUS VIA INFUSION
0.0000 [IU] | Freq: Three times a day (TID) | INTRAVENOUS | Status: DC
  Filled 2014-01-26: qty 10

## 2014-01-26 MED ORDER — PHENYLEPHRINE HCL 10 MG/ML IJ SOLN
20.0000 mg | INTRAVENOUS | Status: DC | PRN
  Administered 2014-01-26: 20 ug/min via INTRAVENOUS

## 2014-01-26 MED ORDER — PROTAMINE SULFATE 10 MG/ML IV SOLN
INTRAVENOUS | Status: AC
  Filled 2014-01-26: qty 25

## 2014-01-26 MED ORDER — DEXMEDETOMIDINE HCL IN NACL 400 MCG/100ML IV SOLN
0.1000 ug/kg/h | INTRAVENOUS | Status: DC
  Filled 2014-01-26: qty 100

## 2014-01-26 MED ORDER — VANCOMYCIN HCL IN DEXTROSE 1-5 GM/200ML-% IV SOLN
1000.0000 mg | Freq: Once | INTRAVENOUS | Status: AC
  Administered 2014-01-26: 1000 mg via INTRAVENOUS
  Filled 2014-01-26: qty 200

## 2014-01-26 MED ORDER — ACETAMINOPHEN 650 MG RE SUPP
650.0000 mg | Freq: Once | RECTAL | Status: AC
  Administered 2014-01-26: 650 mg via RECTAL

## 2014-01-26 MED ORDER — DEXTROSE 5 % IV SOLN
INTRAVENOUS | Status: DC | PRN
  Administered 2014-01-25: 23:00:00 via INTRAVENOUS

## 2014-01-26 MED ORDER — NOREPINEPHRINE BITARTRATE 1 MG/ML IV SOLN
2.0000 ug/min | INTRAVENOUS | Status: DC
  Administered 2014-01-26: 15 ug/min via INTRAVENOUS
  Administered 2014-01-27: 10 ug/min via INTRAVENOUS
  Administered 2014-01-29: 3 ug/min via INTRAVENOUS
  Administered 2014-01-30: 2 ug/min via INTRAVENOUS
  Filled 2014-01-26 (×3): qty 16

## 2014-01-26 MED ORDER — MILRINONE IN DEXTROSE 20 MG/100ML IV SOLN
INTRAVENOUS | Status: DC | PRN
  Administered 2014-01-26: 50 ug/kg/min via INTRAVENOUS

## 2014-01-26 MED ORDER — DOPAMINE-DEXTROSE 3.2-5 MG/ML-% IV SOLN
5.0000 ug/kg/min | INTRAVENOUS | Status: DC
  Administered 2014-01-26 – 2014-02-04 (×8): 5 ug/kg/min via INTRAVENOUS
  Filled 2014-01-26 (×9): qty 250

## 2014-01-26 MED ORDER — LACTATED RINGERS IV SOLN: 500.0000 mL | Freq: Once | INTRAVENOUS | Status: AC | PRN

## 2014-01-26 MED ORDER — MAGNESIUM SULFATE 4000MG/100ML IJ SOLN
4.0000 g | Freq: Once | INTRAMUSCULAR | Status: AC
  Administered 2014-01-26: 4 g via INTRAVENOUS
  Filled 2014-01-26: qty 100

## 2014-01-26 MED ORDER — LIDOCAINE HCL (CARDIAC) 20 MG/ML IV SOLN
1.0000 mg/kg | Freq: Once | INTRAVENOUS | Status: AC
  Administered 2014-01-26: 81.6 mg via INTRAVENOUS

## 2014-01-26 MED ORDER — SODIUM CHLORIDE 0.9 % IV SOLN
INTRAVENOUS | Status: DC | PRN
  Administered 2014-01-25: 23:00:00 via INTRAVENOUS

## 2014-01-26 MED ORDER — CHLORHEXIDINE GLUCONATE CLOTH 2 % EX PADS: 6.0000 | MEDICATED_PAD | Freq: Once | CUTANEOUS | Status: DC

## 2014-01-26 MED ORDER — METOPROLOL TARTRATE 12.5 MG HALF TABLET: 12.5000 mg | ORAL_TABLET | Freq: Once | ORAL | Status: DC

## 2014-01-26 MED ORDER — ACETAMINOPHEN 500 MG PO TABS
1000.0000 mg | ORAL_TABLET | Freq: Four times a day (QID) | ORAL | Status: AC
  Administered 2014-01-31 (×2): 1000 mg via ORAL
  Filled 2014-01-26 (×18): qty 2

## 2014-01-26 MED ORDER — FAMOTIDINE IN NACL 20-0.9 MG/50ML-% IV SOLN
20.0000 mg | Freq: Two times a day (BID) | INTRAVENOUS | Status: AC
  Administered 2014-01-26 (×2): 20 mg via INTRAVENOUS
  Filled 2014-01-26: qty 50

## 2014-01-26 MED ORDER — ALBUMIN HUMAN 5 % IV SOLN
250.0000 mL | INTRAVENOUS | Status: AC | PRN
  Administered 2014-01-26 (×4): 250 mL via INTRAVENOUS
  Filled 2014-01-26 (×2): qty 250

## 2014-01-26 MED ORDER — SODIUM CHLORIDE 0.9 % IV SOLN
INTRAVENOUS | Status: DC
  Administered 2014-01-26: 20 mL via INTRAVENOUS
  Administered 2014-01-29: 13:00:00 via INTRAVENOUS

## 2014-01-26 MED ORDER — METOPROLOL TARTRATE 25 MG/10 ML ORAL SUSPENSION
12.5000 mg | Freq: Two times a day (BID) | ORAL | Status: DC
  Filled 2014-01-26 (×12): qty 5

## 2014-01-26 MED ORDER — ALPRAZOLAM 0.5 MG PO TABS: 1.0000 mg | ORAL_TABLET | Freq: Four times a day (QID) | ORAL | Status: DC

## 2014-01-26 MED ORDER — SODIUM CHLORIDE 0.45 % IV SOLN
INTRAVENOUS | Status: DC
  Administered 2014-01-26: 20 mL/h via INTRAVENOUS
  Administered 2014-01-28 – 2014-01-29 (×2): via INTRAVENOUS

## 2014-01-26 MED ORDER — LEVOTHYROXINE SODIUM 75 MCG PO TABS
75.0000 ug | ORAL_TABLET | Freq: Every day | ORAL | Status: DC
  Administered 2014-01-27 – 2014-02-07 (×12): 75 ug via ORAL
  Filled 2014-01-26 (×15): qty 1

## 2014-01-26 MED ORDER — MILRINONE LOAD VIA INFUSION
50.0000 ug/kg | Freq: Once | INTRAVENOUS | Status: DC
  Filled 2014-01-26: qty 4.2

## 2014-01-26 MED ORDER — BIOTENE DRY MOUTH MT LIQD
15.0000 mL | Freq: Four times a day (QID) | OROMUCOSAL | Status: DC
  Administered 2014-01-26 – 2014-02-09 (×55): 15 mL via OROMUCOSAL

## 2014-01-26 MED ORDER — STERILE WATER FOR INJECTION IJ SOLN
INTRAMUSCULAR | Status: AC
  Filled 2014-01-26: qty 10

## 2014-01-26 MED ORDER — NOREPINEPHRINE BITARTRATE 1 MG/ML IV SOLN
4000.0000 ug | INTRAVENOUS | Status: DC | PRN
  Administered 2014-01-26: 6 ug/min via INTRAVENOUS

## 2014-01-26 MED ORDER — TEMAZEPAM 15 MG PO CAPS: 15.0000 mg | ORAL_CAPSULE | Freq: Once | ORAL | Status: DC | PRN

## 2014-01-26 MED ORDER — OXYCODONE HCL 5 MG PO TABS: 5.0000 mg | ORAL_TABLET | ORAL | Status: DC | PRN

## 2014-01-26 MED ORDER — BISACODYL 5 MG PO TBEC
10.0000 mg | DELAYED_RELEASE_TABLET | Freq: Every day | ORAL | Status: DC
  Administered 2014-02-07: 10 mg via ORAL
  Filled 2014-01-26 (×2): qty 2

## 2014-01-26 MED ORDER — MORPHINE SULFATE 2 MG/ML IJ SOLN: 1.0000 mg | INTRAMUSCULAR | Status: AC | PRN

## 2014-01-26 MED ORDER — LIDOCAINE HCL (CARDIAC) 10 MG/ML IV SOLN: 100.0000 mg | Freq: Once | INTRAVENOUS | Status: DC

## 2014-01-26 MED ORDER — ACETAMINOPHEN 325 MG PO TABS: 650.0000 mg | ORAL_TABLET | ORAL | Status: DC | PRN

## 2014-01-26 MED ORDER — ACETAMINOPHEN 160 MG/5ML PO SOLN: 650.0000 mg | Freq: Once | ORAL | Status: AC

## 2014-01-26 MED ORDER — VASOPRESSIN 20 UNIT/ML IJ SOLN
INTRAMUSCULAR | Status: AC
  Filled 2014-01-26: qty 1

## 2014-01-26 MED ORDER — SODIUM CHLORIDE 0.9 % IJ SOLN
3.0000 mL | Freq: Two times a day (BID) | INTRAMUSCULAR | Status: DC
  Administered 2014-01-27 – 2014-02-01 (×2): 3 mL via INTRAVENOUS

## 2014-01-26 MED FILL — Electrolyte-R (PH 7.4) Solution: INTRAVENOUS | Qty: 1000 | Status: AC

## 2014-01-26 MED FILL — Heparin Sodium (Porcine) Inj 1000 Unit/ML: INTRAMUSCULAR | Qty: 60 | Status: AC

## 2014-01-26 MED FILL — Midazolam HCl Inj 2 MG/2ML (Base Equivalent): INTRAMUSCULAR | Qty: 2 | Status: AC

## 2014-01-26 MED FILL — Sodium Bicarbonate IV Soln 8.4%: INTRAVENOUS | Qty: 200 | Status: AC

## 2014-01-26 MED FILL — Nitroglycerin IV Soln 200 MCG/ML in D5W: INTRAVENOUS | Qty: 1 | Status: AC

## 2014-01-26 MED FILL — Sodium Chloride IV Soln 0.9%: INTRAVENOUS | Qty: 4000 | Status: AC

## 2014-01-26 MED FILL — Magnesium Sulfate Inj 50%: INTRAMUSCULAR | Qty: 10 | Status: AC

## 2014-01-26 MED FILL — Albumin, Human Inj 5%: INTRAVENOUS | Qty: 250 | Status: AC

## 2014-01-26 MED FILL — Heparin Sodium (Porcine) Inj 1000 Unit/ML: INTRAMUSCULAR | Qty: 30 | Status: AC

## 2014-01-26 MED FILL — Verapamil HCl IV Soln 2.5 MG/ML: INTRAVENOUS | Qty: 2 | Status: AC

## 2014-01-26 MED FILL — Heparin Sodium (Porcine) 2 Unit/ML in Sodium Chloride 0.9%: INTRAMUSCULAR | Qty: 500 | Status: AC

## 2014-01-26 MED FILL — Potassium Chloride Inj 2 mEq/ML: INTRAVENOUS | Qty: 40 | Status: AC

## 2014-01-26 MED FILL — Mannitol IV Soln 20%: INTRAVENOUS | Qty: 500 | Status: AC

## 2014-01-26 MED FILL — Calcium Chloride Inj 10%: INTRAVENOUS | Qty: 20 | Status: AC

## 2014-01-26 MED FILL — Lidocaine HCl Local Preservative Free (PF) Inj 1%: INTRAMUSCULAR | Qty: 30 | Status: AC

## 2014-01-26 MED FILL — Lidocaine HCl IV Inj 20 MG/ML: INTRAVENOUS | Qty: 10 | Status: AC

## 2014-01-26 NOTE — Progress Notes (Signed)
Called to see patient to remove right radial sheath from cath last night.  TR band placed on wrist and sheath removed with 12 cc of air in the band.  Instructions given to RN for band removal.  Good Sat waveform on right thumb.  Site looks good level 0.

## 2014-01-26 NOTE — Progress Notes (Signed)
Patient ID: [name], female   DOB: 1940-11-10, 73 y.o.   MRN: [id number] EVENING ROUNDS NOTE :     [address]       [place] [address]             [phone]                 1 Day Post-Op Procedure(s) (LRB): CORONARY ARTERY BYPASS GRAFTING TIMES FOUR USING LEFT INTERNAL MAMMARY ARTERY TO LAD, SAPHENOUS VEIN GRAFTS TO OM1, OM2, AND PDA (N/A) MITRAL VALVE (MV) REPLACEMENT (N/A)  Total Length of Stay:  LOS: 1 day  BP 104/55  Pulse 75  Temp(Src) 100.2 F (37.9 C) (Oral)  Resp 20  Ht 5\' 9"  (1.753 m)  Wt 180 lb (81.647 kg)  BMI 26.57 kg/m2  SpO2 98%  .Intake/Output     07/13 0701 - 07/14 0700   I.V. (mL/kg) 4141.8 (50.7)   Blood 2339   IV Piggyback 2200   Total Intake(mL/kg) 8680.8 (106.3)   Urine (mL/kg/hr) 2025 (1.9)   Drains 80 (0.1)   Blood 2250 (2.1)   Chest Tube 400 (0.4)   Total Output 4755   Net +3925.8         . sodium chloride 20 mL/hr (01/26/14 1118)  . sodium chloride 20 mL/hr (01/25/14 1847)  . sodium chloride 20 mL (01/26/14 1056)  . [START ON 01/27/2014] sodium chloride    . amiodarone 30 mg/hr (01/26/14 1900)  . dexmedetomidine 0.4 mcg/kg/hr (01/26/14 1900)  . DOPamine 5 mcg/kg/min (01/26/14 1900)  . epinephrine 2 mcg/min (01/26/14 1834)  . insulin (NOVOLIN-R) infusion 7 Units/hr (01/26/14 1900)  . lactated ringers 20 mL/hr (01/26/14 1331)  . milrinone 0.3 mcg/kg/min (01/26/14 1900)  . nitroGLYCERIN Stopped (01/26/14 1030)  . norepinephrine (LEVOPHED) Adult infusion 15 mcg/min (01/26/14 1900)  . phenylephrine (NEO-SYNEPHRINE) Adult infusion 20 mcg/min (01/26/14 1900)     Lab Results  Component Value Date   WBC 12.8* 01/26/2014   HGB 9.2* 01/26/2014   HCT 27.0* 01/26/2014   PLT 126* 01/26/2014   GLUCOSE 264* 01/26/2014   CHOL 82 01/26/2014   TRIG 115 01/26/2014   HDL 20* 01/26/2014   LDLCALC 39 01/26/2014   ALT 9 01/25/2014   AST 18 01/25/2014   NA 143 01/26/2014   K 3.0* 01/26/2014   CL 102 01/26/2014   CREATININE 1.00  01/26/2014   BUN 11 01/26/2014   CO2 21 01/25/2014   INR 1.73* 01/26/2014   HGBA1C 8.4* 01/26/2014   sedated on vent  CI 1.83 on current pressors No doppler pulses in feet, but warm appear viable    Delight Ovens MD  Beeper [phone] Office [phone] 01/26/2014 7:56 PM

## 2014-01-26 NOTE — OR Nursing (Signed)
SICU in charge nurse called @ 0542 to update the family.

## 2014-01-26 NOTE — Anesthesia Postprocedure Evaluation (Signed)
  Anesthesia Post-op Note  Patient: [name]  Procedure(s) Performed: Procedure(s): CORONARY ARTERY BYPASS GRAFTING TIMES FOUR USING LEFT INTERNAL MAMMARY ARTERY TO LAD, SAPHENOUS VEIN GRAFTS TO OM1, OM2, AND PDA (N/A) MITRAL VALVE (MV) REPLACEMENT (N/A)  Patient Location: SICU  Anesthesia Type:General  Level of Consciousness: sedated, unresponsive and Patient remains intubated per anesthesia plan  Airway and Oxygen Therapy: Patient remains intubated per anesthesia plan and Patient placed on Ventilator (see vital sign flow sheet for setting)  Post-op Pain: none  Post-op Assessment: Post-op Vital signs reviewed, Patient's Cardiovascular Status Stable, Respiratory Function Stable, Patent Airway and No signs of Nausea or vomiting  Post-op Vital Signs: Reviewed and stable  Last Vitals:  Filed Vitals:   01/25/14 1908  BP:   Pulse: 137  Temp:   Resp:     Complications: No apparent anesthesia complications

## 2014-01-26 NOTE — Op Note (Signed)
[name], [name] NO.:  [id number]  MEDICAL RECORD NO.:  [id number]  LOCATION:  2S04C                        FACILITY:  [facility]  PHYSICIAN:  Kerin Perna, M.D.  DATE OF BIRTH:  1941-05-22  DATE OF PROCEDURE:  01/25/2014 DATE OF DISCHARGE:                              OPERATIVE REPORT   OPERATION: 1. Emergency coronary artery bypass grafting x4 (left internal mammary     artery to LAD, saphenous vein graft to posterior descending,     saphenous vein graft to OM1, saphenous vein graft to OM2). 2. Endoscopic harvest of bilateral greater saphenous vein. 3. Placement of femoral A-line for blood pressure monitoring. 4. Mitral valve replacement for severe mitral regurgitation with     retracted-calcified posterior leaflet.  PREOPERATIVE DIAGNOSES:  Acute myocardial infarction, preop balloon pump, cardiogenic shock, hemodynamic instability, severe mitral regurgitation.  POSTOPERATIVE DIAGNOSES:  Acute myocardial infarction, preop balloon pump, cardiogenic shock, hemodynamic instability, severe mitral regurgitation.  SURGEON:  Kerin Perna, M.D.  ASSISTANT:  Lowella Dandy, PA-C.  ANESTHESIA:  General by Dr. Corky Sox.  CLINICAL NOTE:  The patient is a 73 year old female who presented to an outside hospital with chest pain, shortness of breath, pulmonary edema, and ventricular tachycardia with positive cardiac enzymes.  EKG showed ST elevation in the inferior leads.  A code STEMI was initiated.  She was transferred to [facility] where immediate cardiac catheterization was performed demonstrating occlusion of the right coronary artery, severe three-vessel disease, and reduced LV function.  A balloon pump was placed in the cath lab for low blood pressure.  Emergency cardiothoracic surgical evaluation was requested.  I reviewed the patient's coronary arteriograms with the interventional radiologist, Dr. Herbie Baltimore, who recommended urgent bypass surgery  because of the complexity of the RCA coronary anatomy which could be difficult to treat with percutaneous intervention, the patient has persistent chest pain and persistent ST elevation as well as low blood pressures.  I subsequently discussed emergency CABG with the patient and her family, including the indications, benefits, alternatives, and risks.  The risks of stroke, MI, bleeding, blood transfusion requirement, infection, postop pleural effusions, and death were discussed and the patient and family agreed to proceed with emergency CABG.  While the patient being prepared in the operating room with placement of invasive monitoring lines, she was noted have severe pulmonary hypertension.  The transesophageal echocardiogram in the operating room, demonstrated severe mitral regurgitation, with a fixed calcified posterior leaflet.  The decision was made to combine emergency CABG with mitral valve replacement.  I asked the family to be notified that the operation would be including a mitral valve replacement while I was in the operating room with the patient.  OPERATIVE PROCEDURE:  The patient was brought to the operating room, where Dr. Maple Hudson is.  The patient with invasive monitoring lines and transesophageal echo probe was placed.  The patient was prepped and draped as a sterile field.  A proper time-out was performed.  A sternal incision was made.  The saphenous vein was harvested from both lower extremities.  This was difficult and time consuming due to the location of the saphenous vein deep in the subcutaneous fat.  The internal mammary artery was harvested as a pedicle graft.  It was a small vessel but with excellent flow.  The sternal retractor was placed using the deep blades.  The pericardium was opened and suspended.  Pursestrings were placed in the ascending aorta and right atrium.  When the vein had been harvested, heparin had been administered and ACT was documented as  being therapeutic.  The patient was cannulated and placed on cardiopulmonary bypass.  A second pursestring was placed in the low right atrium for bicaval cannulation. Caval tapes were placed, and cardioplegia catheters were placed for both antegrade and retrograde cold blood cardioplegia.  The patient was cooled to 30 degrees and aortic crossclamp was applied.  One liter of cold blood cardioplegia was delivered in split doses between the antegrade aortic and retrograde coronary sinus catheters.  There was good cardioplegic arrest with septal temperature dropped less than 12 degrees.  Cardioplegia was delivered every 20 minutes or less while the crossclamp was in place.  The distal coronary anastomoses were then performed.  First, distal anastomosis was the posterior descending branch of the right coronary.  The right coronary was totally calcified and was not a graftable.  Posterior descending was a 1.5-mm vessel proximal 90% stenosis.  A reverse saphenous vein was sewn end-to-side with running 7- 0 Prolene with good flow through the graft.  Cardioplegia was redosed.  The second distal anastomosis was the OM2 branch of the left coronary. This was a small 1.2-mm vessel.  There was a proximal 80% stenosis.  A reverse saphenous vein was sewn end-to-side with running 7-0 Prolene, and there was adequate flow through the graft.  Cardioplegia was redosed.  The third distal anastomosis was the OM1 branch of left circumflex. This was developed to exposed due to the patient's obese body habitus. The vessel was in a thick layer of epicardial fat.  It was 1.5-mm vessel.  A reverse saphenous vein was sewn end-to-side with running 7-0 Prolene with good flow through graft.  Cardioplegia was redosed.  The fourth distal anastomosis was then placed to the mid LAD.  It was deeply intramyocardial proximally.  It was a 1.5-mm vessel.  There was a proximal 90% stenosis.  The left IMA pedicle was brought  through an opening in the left lateral pericardium, was brought down onto the LAD and sewn end-to-side with running 8-0 Prolene.  There was good flow through the anastomosis after briefly releasing the pedicle bull-dog on the mammary artery.  The bull-dog was reapplied and the pedicle was secured to the epicardium with 6-0 Prolene.  Cardioplegia was redosed.  Attention was then directed to the mitral valve.  The interatrial groove was dissected.  A left atriotomy was performed.  The retractor blades were placed for adequate, but somewhat difficult exposure.  The valve was assessed.  There is complete tethering of the posterior leaflet due to heavy calcification.  I do not feel that annuloplasty ring would provide adequate repair due to the calcified nature of the posterior anulus.  The valve was sized to a 25-mm pericardial tissue valve.  2-0 Ethibond supra-annular pledgeted sutures were placed around the anulus. The valve was prepared according to protocol.  The sutures were placed through the sewing ring.  The valve appeared to seat well within the lumen of the valve including the calcified posterior leaflets.  The struts were tethered centrally above the suture being tied and then the struts were released after the valve had been seated and secured. Repair of  the valve had no leak on a saline water test, and there was no evidence of obstruction.  The atriotomy was then closed in layers.  It should be noted that the operative field was insufflated with CO2 while the valve was being replaced.  The proximal coronary anastomoses were performed on the ascending aorta using a 4.0 mm punch running 6-0 Prolene.  Prior to tying down the final proximal anastomosis, air was vented from the coronaries with a dose of retrograde warm blood cardioplegia.  It should be noted that the vein graft to the OM2 was placed to the proximal portion of the OM1 vein graft and Y anastomosis.  The crossclamp  was removed then the heart resumed a spontaneous rhythm. The vein grafts were de-aired and cardioplegia cannulas were removed. The patient was rewarmed and reperfused and temporary pacing wires were applied.  The lungs were expanded and ventilator was resumed.  The patient was weaned from cardiopulmonary bypass with minimal difficulty. The preoperative balloon pump was used to augment 1 to 1.  The patient appeared to be stable.  However, the transesophageal echo showed, although the mitral valve prosthesis was working well without evidence of stenosis or insufficiency.  There was obstruction in the LV outflow tract either from a valve strut or portion of the native mitral valve.  This appeared to be significant obstruction with a gradient and for that reason, we went right back on cardiopulmonary bypass with dual venous cannulation. Replaced the cardioplegia cannulas and had repeated hypothermic cardioplegic arrest.  The left atriotomy was opened.  The new valve was inspected and was removed.  The valve was inspected.  The anterior leaflet was fully excised.  The posterior leaflet was left in place because it was basically a big collection of calcium.  We used a 25 mm magna ease valve which sized perfectly.  The new set of supra-annular 2-0 pledgeted Ethibond sutures were placed around the anulus.  The valve was prepared according to protocol, and the sutures were placed in the valve, seated and sutures were tied.  The valve was checked with a saline test and there was no insufficiency.  The atriotomy was then closed again with 2 layer running 3-0 Prolene.  The patient was rewarmed and the crossclamp was removed after a dose of retrograde warm blood cardioplegia.  The lungs then re-expanded and ventilator was resumed.  Low-dose inotropes were started and the balloon pump was started at 1 to 1 augmentation.  The patient's medical condition was optimized and then she was weaned from  cardiopulmonary bypass again easily with good LV function except for some inferior wall hypokinesia.  There was no mitral regurgitation, and after placing the 25 mm prosthesis, there was no evidence of LV outflow tract obstruction.  Cardiac output was adequate and hemodynamics were good.  Protamine was administered without adverse reaction.  The cannulas were removed.  The patient had a significant coagulopathy and a history of Plavix and was given platelets, FFP, as well as cryoprecipitate for a low fibrinogen level.  This improved coagulation function.  The mediastinum was irrigated with warm saline. The superior pericardial fat was closed over the aorta.  The anterior mediastinal posterior mediastinal and left pleural chest tubes were placed and brought out through separate incisions.  The sternum was closed with wire.  The pectoralis fascia was closed in running #1 Vicryl.  The subcutaneous and skin layers were closed in running Vicryl. The patient remained stable.  Total cardiopulmonary bypass time was 300  minutes.     Kerin Perna, M.D.    PV/MEDQ  D:  01/26/2014  T:  01/26/2014  Job:  [id number]

## 2014-01-26 NOTE — Progress Notes (Signed)
Chaplain requested for family support in 2C waiting area. Presented to pt's daughter, [name], who expressed emotional distress, anxiety, and fear over losing her mother, who was in open heart surgery. [name] reported to Chaplain that her brother passed away only a month ago, from a heart attack. She and her family are still grieving this loss. Chaplain provided emotional support to [name] and her son and daughter-in-law through empathic listening, spiritual conversation, pastoral presence, and prayer. Will refer to unit chaplain for follow up Monday morning.  Maurene Capes [phone]

## 2014-01-26 NOTE — Progress Notes (Signed)
Utilization Review Completed.Desta Bujak T7/13/2015  

## 2014-01-26 NOTE — Transfer of Care (Signed)
Immediate Anesthesia Transfer of Care Note  Patient: [name]  Procedure(s) Performed: Procedure(s): CORONARY ARTERY BYPASS GRAFTING TIMES FOUR USING LEFT INTERNAL MAMMARY ARTERY TO LAD, SAPHENOUS VEIN GRAFTS TO OM1, OM2, AND PDA (N/A) MITRAL VALVE (MV) REPLACEMENT (N/A)  Patient Location: SICU  Anesthesia Type:General  Level of Consciousness: sedated, unresponsive and Patient remains intubated per anesthesia plan  Airway & Oxygen Therapy: Patient remains intubated per anesthesia plan and Patient placed on Ventilator (see vital sign flow sheet for setting)  Post-op Assessment: Report given to PACU RN and Post -op Vital signs reviewed and stable  Post vital signs: Reviewed and stable  Complications: No apparent anesthesia complications

## 2014-01-26 NOTE — Progress Notes (Signed)
Pt placed on full support since no planned extubation today. Other vent changes made per ABG. Pt tolerating at this time. RT will continue to monitor.

## 2014-01-26 NOTE — OR Nursing (Signed)
SICU in charge nurse called @ 2305 to let the family know that surgery started

## 2014-01-26 NOTE — Brief Op Note (Signed)
01/25/2014  4:05 AM  PATIENT:  [name]  73 y.o. female  PRE-OPERATIVE DIAGNOSIS:  CAD  POST-OPERATIVE DIAGNOSIS:  CAD  PROCEDURE:  Procedure(s) with comments:  EMERGENT CORONARY ARTERY BYPASS GRAFTING x 4 -LIMA to LAD -SVG to OM1 -SVG to OM2 -SVG to PDA  MITRAL VALVE REPLACEMENT  -25 mm Edwards Magna Ease Pericardial Tissue Valve  ENDOSCOPIC SAPHENOUS VEIN HARVEST RIGHT AND LEFT LEG  SURGEON:  Surgeon(s) and Role:    * Kerin Perna, MD - Primary  PHYSICIAN ASSISTANT: Lowella Dandy PA-C  ANESTHESIA:   general  EBL:  Total I/O In: 2800 [I.V.:2800] Out: 800 [Urine:800]  BLOOD ADMINISTERED: CELLSAVER, 3U packed cells, FFP, CRYO  DRAINS: Left pleural chest tube. Mediastinal chest drain   LOCAL MEDICATIONS USED:  NONE  SPECIMEN:  Anterior Leaflet Mitral Valve  DISPOSITION OF SPECIMEN:  N/A  COUNTS:  YES  TOURNIQUET:  * No tourniquets in log *  DICTATION: .Dragon Dictation  PLAN OF CARE: Admit to inpatient   PATIENT DISPOSITION:  ICU - intubated and hemodynamically stable.   Delay start of Pharmacological VTE agent (>24hrs) due to surgical blood loss or risk of bleeding: yes   Mitral Valve Etiology  MV Insufficiency: Moderate  MV Disease: Yes.  MV Stenosis: No mitral valve stenosis.  MV Disease Functional Class: MV Disease Functional Class: Type I.   Etiology (Choose at least one and up to five): Ischemic - acute, post infarction.   MV Lesions (Choose at least one): Annular dilation. and Leaflet calcification.   Mitral/Tricuspid/Pulmonary Valve Procedure  Mitral Valve Procedure Performed:  Replacement:  Implant: Bioprosthetic Valve: Implant model number 7300TFX , Size 25, Unique Device Identifier .[id number]

## 2014-01-26 NOTE — Anesthesia Preprocedure Evaluation (Signed)
Anesthesia Evaluation  Patient identified by MRN, date of birth, ID band Patient awake    Reviewed: Allergy & Precautions, H&P , NPO status , Patient's Chart, lab work & pertinent test results  History of Anesthesia Complications Negative for: history of anesthetic complications  Airway Mallampati: II TM Distance: >3 FB Neck ROM: Full    Dental  (+) Missing, Poor Dentition   Pulmonary    + decreased breath sounds      Cardiovascular hypertension, Pt. on medications and Pt. on home beta blockers + angina + CAD, + Past MI, + Cardiac Stents and + Peripheral Vascular Disease + dysrhythmias Ventricular Tachycardia Rhythm:Regular  Ef ~40% from ventriculogram, diffuse cad, previous rca stent   Neuro/Psych PSYCHIATRIC DISORDERS Anxiety TIA   GI/Hepatic negative GI ROS, Neg liver ROS,   Endo/Other  diabetes, Poorly Controlled, Insulin Dependent  Renal/GU      Musculoskeletal   Abdominal   Peds  Hematology negative hematology ROS (+)   Anesthesia Other Findings   Reproductive/Obstetrics                           Anesthesia Physical Anesthesia Plan  ASA: IV and emergent  Anesthesia Plan: General   Post-op Pain Management:    Induction: Intravenous  Airway Management Planned: Oral ETT  Additional Equipment: CVP, PA Cath, Ultrasound Guidance Line Placement, 3D TEE, TEE and Arterial line  Intra-op Plan:   Post-operative Plan: Post-operative intubation/ventilation  Informed Consent: I have reviewed the patients History and Physical, chart, labs and discussed the procedure including the risks, benefits and alternatives for the proposed anesthesia with the patient or authorized representative who has indicated his/her understanding and acceptance.   Dental advisory given  Plan Discussed with: Surgeon and CRNA  Anesthesia Plan Comments:         Anesthesia Quick Evaluation

## 2014-01-27 ENCOUNTER — Inpatient Hospital Stay (HOSPITAL_COMMUNITY): Payer: Medicare Other

## 2014-01-27 DIAGNOSIS — Z01810 Encounter for preprocedural cardiovascular examination: Secondary | ICD-10-CM

## 2014-01-27 LAB — CARBOXYHEMOGLOBIN
Carboxyhemoglobin: 1.1 % (ref 0.5–1.5)
Methemoglobin: 1 % (ref 0.0–1.5)
O2 Saturation: 60.1 %
Total hemoglobin: 7.7 g/dL — ABNORMAL LOW (ref 12.0–16.0)

## 2014-01-27 LAB — CBC
HCT: 24.5 % — ABNORMAL LOW (ref 36.0–46.0)
HCT: 25.6 % — ABNORMAL LOW (ref 36.0–46.0)
HEMOGLOBIN: 9.1 g/dL — AB (ref 12.0–15.0)
Hemoglobin: 8.4 g/dL — ABNORMAL LOW (ref 12.0–15.0)
MCH: 29.4 pg (ref 26.0–34.0)
MCH: 30.5 pg (ref 26.0–34.0)
MCHC: 34.3 g/dL (ref 30.0–36.0)
MCHC: 35.5 g/dL (ref 30.0–36.0)
MCV: 85.7 fL (ref 78.0–100.0)
MCV: 85.9 fL (ref 78.0–100.0)
PLATELETS: 86 10*3/uL — AB (ref 150–400)
Platelets: 101 10*3/uL — ABNORMAL LOW (ref 150–400)
RBC: 2.86 MIL/uL — ABNORMAL LOW (ref 3.87–5.11)
RBC: 2.98 MIL/uL — ABNORMAL LOW (ref 3.87–5.11)
RDW: 15.4 % (ref 11.5–15.5)
RDW: 15.2 % (ref 11.5–15.5)
WBC: 15.8 10*3/uL — ABNORMAL HIGH (ref 4.0–10.5)
WBC: 16.9 10*3/uL — ABNORMAL HIGH (ref 4.0–10.5)

## 2014-01-27 LAB — POCT I-STAT 3, ART BLOOD GAS (G3+)
Acid-base deficit: 1 mmol/L (ref 0.0–2.0)
Bicarbonate: 22.7 mEq/L (ref 20.0–24.0)
O2 Saturation: 97 %
Patient temperature: 37.7
TCO2: 24 mmol/L (ref 0–100)
pCO2 arterial: 33.1 mmHg — ABNORMAL LOW (ref 35.0–45.0)
pH, Arterial: 7.447 (ref 7.350–7.450)
pO2, Arterial: 84 mmHg (ref 80.0–100.0)

## 2014-01-27 LAB — PREPARE PLATELET PHERESIS
Unit division: 0
Unit division: 0

## 2014-01-27 LAB — PREPARE FRESH FROZEN PLASMA
Unit division: 0
Unit division: 0

## 2014-01-27 LAB — BLOOD GAS, ARTERIAL
Acid-base deficit: 5.1 mmol/L — ABNORMAL HIGH (ref 0.0–2.0)
Bicarbonate: 18.9 mEq/L — ABNORMAL LOW (ref 20.0–24.0)
Drawn by: 36496
FIO2: 0.4 %
MECHVT: 600 mL
O2 Saturation: 98.1 %
PEEP: 5 cmH2O
Patient temperature: 98.6
RATE: 15 resp/min
TCO2: 19.9 mmol/L (ref 0–100)
pCO2 arterial: 31.9 mmHg — ABNORMAL LOW (ref 35.0–45.0)
pH, Arterial: 7.391 (ref 7.350–7.450)
pO2, Arterial: 99.1 mmHg (ref 80.0–100.0)

## 2014-01-27 LAB — BASIC METABOLIC PANEL
Anion gap: 21 — ABNORMAL HIGH (ref 5–15)
BUN: 13 mg/dL (ref 6–23)
CO2: 18 mEq/L — ABNORMAL LOW (ref 19–32)
Calcium: 7.9 mg/dL — ABNORMAL LOW (ref 8.4–10.5)
Chloride: 102 mEq/L (ref 96–112)
Creatinine, Ser: 1.2 mg/dL — ABNORMAL HIGH (ref 0.50–1.10)
GFR calc Af Amer: 50 mL/min — ABNORMAL LOW (ref >90)
GFR calc non Af Amer: 43 mL/min — ABNORMAL LOW (ref >90)
Glucose, Bld: 234 mg/dL — ABNORMAL HIGH (ref 70–99)
Potassium: 3.5 mEq/L — ABNORMAL LOW (ref 3.7–5.3)
Sodium: 141 mEq/L (ref 137–147)

## 2014-01-27 LAB — POCT I-STAT, CHEM 8
BUN: 11 mg/dL (ref 6–23)
Calcium, Ion: 1.12 mmol/L — ABNORMAL LOW (ref 1.13–1.30)
Chloride: 102 mEq/L (ref 96–112)
Creatinine, Ser: 1.4 mg/dL — ABNORMAL HIGH (ref 0.50–1.10)
Glucose, Bld: 135 mg/dL — ABNORMAL HIGH (ref 70–99)
HCT: 27 % — ABNORMAL LOW (ref 36.0–46.0)
Hemoglobin: 9.2 g/dL — ABNORMAL LOW (ref 12.0–15.0)
Potassium: 3.5 mEq/L — ABNORMAL LOW (ref 3.7–5.3)
Sodium: 141 mEq/L (ref 137–147)
TCO2: 20 mmol/L (ref 0–100)

## 2014-01-27 LAB — HEPATIC FUNCTION PANEL
ALT: 31 U/L (ref 0–35)
AST: 213 U/L — ABNORMAL HIGH (ref 0–37)
Albumin: 3.2 g/dL — ABNORMAL LOW (ref 3.5–5.2)
Alkaline Phosphatase: 31 U/L — ABNORMAL LOW (ref 39–117)
Bilirubin, Direct: 0.3 mg/dL (ref 0.0–0.3)
Indirect Bilirubin: 0.5 mg/dL (ref 0.3–0.9)
Total Bilirubin: 0.8 mg/dL (ref 0.3–1.2)
Total Protein: 4.7 g/dL — ABNORMAL LOW (ref 6.0–8.3)

## 2014-01-27 LAB — CREATININE, SERUM
CREATININE: 1.31 mg/dL — AB (ref 0.50–1.10)
GFR calc Af Amer: 45 mL/min — ABNORMAL LOW (ref >90)
GFR, EST NON AFRICAN AMERICAN: 39 mL/min — AB (ref >90)

## 2014-01-27 LAB — GLUCOSE, CAPILLARY
Glucose-Capillary: 219 mg/dL — ABNORMAL HIGH (ref 70–99)
Glucose-Capillary: 223 mg/dL — ABNORMAL HIGH (ref 70–99)
Glucose-Capillary: 227 mg/dL — ABNORMAL HIGH (ref 70–99)
Glucose-Capillary: 227 mg/dL — ABNORMAL HIGH (ref 70–99)
Glucose-Capillary: 242 mg/dL — ABNORMAL HIGH (ref 70–99)
Glucose-Capillary: 243 mg/dL — ABNORMAL HIGH (ref 70–99)
Glucose-Capillary: 248 mg/dL — ABNORMAL HIGH (ref 70–99)
Glucose-Capillary: 253 mg/dL — ABNORMAL HIGH (ref 70–99)
Glucose-Capillary: 253 mg/dL — ABNORMAL HIGH (ref 70–99)
Glucose-Capillary: 267 mg/dL — ABNORMAL HIGH (ref 70–99)
Glucose-Capillary: 282 mg/dL — ABNORMAL HIGH (ref 70–99)
Glucose-Capillary: 290 mg/dL — ABNORMAL HIGH (ref 70–99)
Glucose-Capillary: 292 mg/dL — ABNORMAL HIGH (ref 70–99)
Glucose-Capillary: 298 mg/dL — ABNORMAL HIGH (ref 70–99)
Glucose-Capillary: 300 mg/dL — ABNORMAL HIGH (ref 70–99)
Glucose-Capillary: 237 mg/dL — ABNORMAL HIGH (ref 70–99)

## 2014-01-27 LAB — PREPARE CRYOPRECIPITATE
Unit division: 0
Unit division: 0

## 2014-01-27 LAB — APTT
aPTT: 37 seconds (ref 24–37)
aPTT: 66 seconds — ABNORMAL HIGH (ref 24–37)

## 2014-01-27 LAB — MAGNESIUM
MAGNESIUM: 1.8 mg/dL (ref 1.5–2.5)
Magnesium: 2 mg/dL (ref 1.5–2.5)

## 2014-01-27 LAB — PREPARE RBC (CROSSMATCH)

## 2014-01-27 MED ORDER — SODIUM CHLORIDE 0.9 % IJ SOLN
10.0000 mL | INTRAMUSCULAR | Status: DC | PRN
  Administered 2014-02-10: 10 mL

## 2014-01-27 MED ORDER — FUROSEMIDE 10 MG/ML IJ SOLN
10.0000 mg/h | INTRAVENOUS | Status: DC
  Administered 2014-01-27: 4 mg/h via INTRAVENOUS
  Administered 2014-01-28 – 2014-02-03 (×5): 8 mg/h via INTRAVENOUS
  Administered 2014-02-04 – 2014-02-05 (×2): 10 mg/h via INTRAVENOUS
  Filled 2014-01-27 (×13): qty 25

## 2014-01-27 MED ORDER — POTASSIUM CHLORIDE 10 MEQ/50ML IV SOLN
10.0000 meq | INTRAVENOUS | Status: AC
  Administered 2014-01-27 (×2): 10 meq via INTRAVENOUS

## 2014-01-27 MED ORDER — INSULIN GLARGINE 100 UNIT/ML ~~LOC~~ SOLN
10.0000 [IU] | Freq: Two times a day (BID) | SUBCUTANEOUS | Status: DC
  Administered 2014-01-27 – 2014-02-01 (×10): 10 [IU] via SUBCUTANEOUS
  Filled 2014-01-27 (×12): qty 0.1

## 2014-01-27 MED ORDER — LIDOCAINE HCL (CARDIAC) 20 MG/ML IV SOLN
100.0000 mg | Freq: Once | INTRAVENOUS | Status: AC
  Administered 2014-01-27: 100 mg via INTRAVENOUS

## 2014-01-27 MED ORDER — SODIUM BICARBONATE 8.4 % IV SOLN
100.0000 meq | Freq: Once | INTRAVENOUS | Status: AC
  Administered 2014-01-27: 100 meq via INTRAVENOUS
  Filled 2014-01-27: qty 100

## 2014-01-27 MED ORDER — POTASSIUM CHLORIDE 10 MEQ/50ML IV SOLN
10.0000 meq | INTRAVENOUS | Status: AC | PRN
  Administered 2014-01-27 (×3): 10 meq via INTRAVENOUS
  Filled 2014-01-27 (×3): qty 50

## 2014-01-27 MED ORDER — INSULIN GLARGINE 100 UNIT/ML ~~LOC~~ SOLN
14.0000 [IU] | Freq: Two times a day (BID) | SUBCUTANEOUS | Status: DC
  Administered 2014-01-27: 14 [IU] via SUBCUTANEOUS
  Filled 2014-01-27 (×2): qty 0.14

## 2014-01-27 MED ORDER — LIDOCAINE HCL (CARDIAC) 10 MG/ML IV SOLN
100.0000 mg | Freq: Once | INTRAVENOUS | Status: DC
  Filled 2014-01-27: qty 10

## 2014-01-27 MED ORDER — SODIUM CHLORIDE 0.9 % IV SOLN
0.0400 mg/kg/h | INTRAVENOUS | Status: DC
  Administered 2014-01-27: 0.05 mg/kg/h via INTRAVENOUS
  Administered 2014-01-28 – 2014-01-29 (×2): 0.04 mg/kg/h via INTRAVENOUS
  Filled 2014-01-27 (×3): qty 250

## 2014-01-27 MED ORDER — POTASSIUM CHLORIDE 10 MEQ/50ML IV SOLN
INTRAVENOUS | Status: AC
Start: 2014-01-27 — End: 2014-01-27
  Filled 2014-01-27: qty 150

## 2014-01-27 MED ORDER — PANTOPRAZOLE SODIUM 40 MG PO PACK
40.0000 mg | PACK | Freq: Every day | ORAL | Status: DC
  Administered 2014-01-27 – 2014-02-07 (×12): 40 mg
  Filled 2014-01-27 (×14): qty 20

## 2014-01-27 MED ORDER — SODIUM CHLORIDE 0.9 % IJ SOLN
10.0000 mL | Freq: Two times a day (BID) | INTRAMUSCULAR | Status: DC
Start: 2014-01-27 — End: 2014-03-06
  Administered 2014-01-27 – 2014-01-29 (×5): 10 mL
  Administered 2014-01-29 – 2014-01-30 (×2): 40 mL
  Administered 2014-01-31 – 2014-02-09 (×16): 10 mL
  Administered 2014-02-10: 20 mL
  Administered 2014-02-11 – 2014-02-24 (×23): 10 mL
  Administered 2014-02-24: 20 mL
  Administered 2014-02-25 – 2014-03-03 (×9): 10 mL
  Administered 2014-03-03: 20 mL
  Administered 2014-03-04 – 2014-03-06 (×5): 10 mL

## 2014-01-27 NOTE — Procedures (Signed)
Arterial Catheter Insertion Procedure Note [name] [id number] 08-21-1940  Procedure: Insertion of Arterial Catheter  Indications: Blood pressure monitoring and Frequent blood sampling  Procedure Details Consent: Unable to obtain consent because of altered level of consciousness. Time Out: Verified patient identification, verified procedure, site/side was marked, verified correct patient position, special equipment/implants available, medications/allergies/relevent history reviewed, required imaging and test results available.  Performed  Maximum sterile technique was used including antiseptics, cap, gloves, gown, hand hygiene, mask and sheet. Skin prep: Chlorhexidine; local anesthetic administered 20 gauge catheter was inserted into left radial artery using the Seldinger technique.  Evaluation Blood flow good; BP tracing good. Complications: No apparent complications.   Armando Gang 01/27/2014

## 2014-01-27 NOTE — Progress Notes (Signed)
EKG CRITICAL VALUE     12 lead EKG performed.  Critical value noted. Acey Lav, RN notified.   Deitra Mayo, CCT 01/27/2014 8:37 AM

## 2014-01-27 NOTE — Progress Notes (Addendum)
VASCULAR LAB PRELIMINARY  PRELIMINARY  PRELIMINARY  PRELIMINARY Pre-op Cardiac Surgery  Carotid Findings:  Technically difficult due to balloon pump and neck line. Image quality on the right sub optimal. Bilateral 1% to 39% ICA stenosis. Right vertebral artery flow not insonated. Left vertebral artery flow is antegrade  Upper Extremity Right Left  Brachial Pressures    Radial Waveforms    Ulnar Waveforms    Palmar Arch (Allen's Test)     Findings:      Lower  Extremity Right Left  Dorsalis Pedis    Anterior Tibial    Posterior Tibial    Ankle/Brachial Indices      Findings:  Unable to obtain ABIs due to interference from an unknown electrical source.   [name], [name], RVT 01/27/2014, 4:00 PM

## 2014-01-27 NOTE — Progress Notes (Signed)
Peripherally Inserted Central Catheter/Midline Placement  The IV Nurse has discussed with the patient and/or persons authorized to consent for the patient, the purpose of this procedure and the potential benefits and risks involved with this procedure.  The benefits include less needle sticks, lab draws from the catheter and patient may be discharged home with the catheter.  Risks include, but not limited to, infection, bleeding, blood clot (thrombus formation), and puncture of an artery; nerve damage and irregular heat beat.  Alternatives to this procedure were also discussed.  PICC/Midline Placement Documentation        Lisabeth Devoid 01/27/2014, 12:06 PM Consent obtained by Lazarus Gowda, RN at the bedside from daughter.

## 2014-01-27 NOTE — Progress Notes (Signed)
CT surgery p.m. Rounds  Patient stable day, norepinephrine reduced from 14-10 mcg per minute  aortic  balloon pump 1-2 counter pulsation Urine output 30 cc per hour Evening labs reviewed, bicarbonate is normal, hemoglobin 9 g, potassium 3.5, creatinine 1.4 Blood sugar improved after addition of Lantus to insulin drip

## 2014-01-27 NOTE — Progress Notes (Signed)
2 Days Post-Op Procedure(s) (LRB): CORONARY ARTERY BYPASS GRAFTING TIMES FOUR USING LEFT INTERNAL MAMMARY ARTERY TO LAD, SAPHENOUS VEIN GRAFTS TO OM1, OM2, AND PDA (N/A) MITRAL VALVE (MV) REPLACEMENT (N/A) Subjective: Stable overnight after emergency CABGx4 and MVR for DMI, severe MR, preop IABP for cardiogenic shock, preop plavix  preop cbg 550 CI  1.8 with Co-ox 60%  Low dose inotropes Junctional rhythm on 12 lead ekg CXR with mild edema, u/o 30cc/hr Sedated on vent Fi02 .40 Severe hyperglycemia on insulin drip  Objective: Vital signs in last 24 hours: Temp:  [97.5 F (36.4 C)-100.6 F (38.1 C)] 100.2 F (37.9 C) (07/14 1100) Pulse Rate:  [37-93] 45 (07/14 1100) Cardiac Rhythm:  [-] Junctional rhythm (07/14 0845) Resp:  [0-21] 16 (07/14 1100) BP: (92-129)/(35-70) 121/63 mmHg (07/14 1000) SpO2:  [94 %-100 %] 97 % (07/14 1100) FiO2 (%):  [40 %-80 %] 40 % (07/14 0940) Weight:  [237 lb 3.4 oz (107.6 kg)] 237 lb 3.4 oz (107.6 kg) (07/14 0500)  Hemodynamic parameters for last 24 hours: PAP: (32-52)/(19-26) 39/21 mmHg CO:  [3 L/min-3.8 L/min] 3.5 L/min CI:  [1.5 L/min/m2-1.9 L/min/m2] 1.8 L/min/m2  Intake/Output from previous day: 07/13 0701 - 07/14 0700 In: 11431.2 [I.V.:6012.2; MNOTR:7116; NG/GT:30; IV Piggyback:3050] Out: 5790 [Urine:2440; Emesis/NG output:250; Drains:88; Blood:2250; Chest Tube:680] Intake/Output this shift: Total I/O In: 787.3 [I.V.:362.3; Blood:325; IV Piggyback:100] Out: 115 [Urine:115]  lunfgs clear No murmur No pedal pulses- feet warm- no pulse present preop  Lab Results:  Recent Labs  01/26/14 1615 01/26/14 1631 01/27/14 0415  WBC 12.8*  --  15.8*  HGB 9.0* 9.2* 8.4*  HCT 26.1* 27.0* 24.5*  PLT 126*  --  101*   BMET:  Recent Labs  01/26/14 1100  01/26/14 1631 01/27/14 0415  NA 145  --  143 141  K 3.0*  --  3.0* 3.5*  CL 106  --  102 102  CO2 20  --   --  18*  GLUCOSE 252*  --  264* 234*  BUN 12  --  11 13  CREATININE 0.86  < >  1.00 1.20*  CALCIUM 7.8*  --   --  7.9*  < > = values in this interval not displayed.  PT/INR:  Recent Labs  01/26/14 1030  LABPROT 20.3*  INR 1.73*   ABG    Component Value Date/Time   PHART 7.391 01/27/2014 0355   HCO3 18.9* 01/27/2014 0355   TCO2 19.9 01/27/2014 0355   ACIDBASEDEF 5.1* 01/27/2014 0355   O2SAT 60.1 01/27/2014 0820   CBG (last 3)   Recent Labs  01/27/14 0109 01/27/14 0208 01/27/14 0302  GLUCAP 253* 227* 237*    Assessment/Plan: S/P Procedure(s) (LRB): CORONARY ARTERY BYPASS GRAFTING TIMES FOUR USING LEFT INTERNAL MAMMARY ARTERY TO LAD, SAPHENOUS VEIN GRAFTS TO OM1, OM2, AND PDA (N/A) MITRAL VALVE (MV) REPLACEMENT (N/A) Cont IABP and vent ( preop acute pulmonary edema) today Start low dose bival for IABP, atrial arrhythmia with low plt count Start lasix diuresis after transfusion for blood loss anemia  LOS: 2 days    VAN TRIGT III,Shabrea Weldin 01/27/2014

## 2014-01-28 ENCOUNTER — Inpatient Hospital Stay (HOSPITAL_COMMUNITY): Payer: Medicare Other

## 2014-01-28 DIAGNOSIS — I2510 Atherosclerotic heart disease of native coronary artery without angina pectoris: Secondary | ICD-10-CM

## 2014-01-28 DIAGNOSIS — E8779 Other fluid overload: Secondary | ICD-10-CM

## 2014-01-28 DIAGNOSIS — I219 Acute myocardial infarction, unspecified: Secondary | ICD-10-CM

## 2014-01-28 LAB — GLUCOSE, CAPILLARY
Glucose-Capillary: 103 mg/dL — ABNORMAL HIGH (ref 70–99)
Glucose-Capillary: 103 mg/dL — ABNORMAL HIGH (ref 70–99)
Glucose-Capillary: 104 mg/dL — ABNORMAL HIGH (ref 70–99)
Glucose-Capillary: 104 mg/dL — ABNORMAL HIGH (ref 70–99)
Glucose-Capillary: 108 mg/dL — ABNORMAL HIGH (ref 70–99)
Glucose-Capillary: 110 mg/dL — ABNORMAL HIGH (ref 70–99)
Glucose-Capillary: 112 mg/dL — ABNORMAL HIGH (ref 70–99)
Glucose-Capillary: 113 mg/dL — ABNORMAL HIGH (ref 70–99)
Glucose-Capillary: 114 mg/dL — ABNORMAL HIGH (ref 70–99)
Glucose-Capillary: 117 mg/dL — ABNORMAL HIGH (ref 70–99)
Glucose-Capillary: 117 mg/dL — ABNORMAL HIGH (ref 70–99)
Glucose-Capillary: 119 mg/dL — ABNORMAL HIGH (ref 70–99)
Glucose-Capillary: 119 mg/dL — ABNORMAL HIGH (ref 70–99)
Glucose-Capillary: 119 mg/dL — ABNORMAL HIGH (ref 70–99)
Glucose-Capillary: 120 mg/dL — ABNORMAL HIGH (ref 70–99)
Glucose-Capillary: 122 mg/dL — ABNORMAL HIGH (ref 70–99)
Glucose-Capillary: 123 mg/dL — ABNORMAL HIGH (ref 70–99)
Glucose-Capillary: 124 mg/dL — ABNORMAL HIGH (ref 70–99)
Glucose-Capillary: 126 mg/dL — ABNORMAL HIGH (ref 70–99)
Glucose-Capillary: 128 mg/dL — ABNORMAL HIGH (ref 70–99)
Glucose-Capillary: 129 mg/dL — ABNORMAL HIGH (ref 70–99)
Glucose-Capillary: 132 mg/dL — ABNORMAL HIGH (ref 70–99)
Glucose-Capillary: 136 mg/dL — ABNORMAL HIGH (ref 70–99)
Glucose-Capillary: 142 mg/dL — ABNORMAL HIGH (ref 70–99)
Glucose-Capillary: 160 mg/dL — ABNORMAL HIGH (ref 70–99)
Glucose-Capillary: 164 mg/dL — ABNORMAL HIGH (ref 70–99)
Glucose-Capillary: 172 mg/dL — ABNORMAL HIGH (ref 70–99)
Glucose-Capillary: 192 mg/dL — ABNORMAL HIGH (ref 70–99)
Glucose-Capillary: 198 mg/dL — ABNORMAL HIGH (ref 70–99)
Glucose-Capillary: 207 mg/dL — ABNORMAL HIGH (ref 70–99)
Glucose-Capillary: 215 mg/dL — ABNORMAL HIGH (ref 70–99)
Glucose-Capillary: 223 mg/dL — ABNORMAL HIGH (ref 70–99)
Glucose-Capillary: 93 mg/dL (ref 70–99)
Glucose-Capillary: 96 mg/dL (ref 70–99)
Glucose-Capillary: 97 mg/dL (ref 70–99)
Glucose-Capillary: 98 mg/dL (ref 70–99)

## 2014-01-28 LAB — CBC
HCT: 25.8 % — ABNORMAL LOW (ref 36.0–46.0)
Hemoglobin: 9 g/dL — ABNORMAL LOW (ref 12.0–15.0)
MCH: 30 pg (ref 26.0–34.0)
MCHC: 34.9 g/dL (ref 30.0–36.0)
MCV: 86 fL (ref 78.0–100.0)
Platelets: 80 10*3/uL — ABNORMAL LOW (ref 150–400)
RBC: 3 MIL/uL — ABNORMAL LOW (ref 3.87–5.11)
RDW: 15.3 % (ref 11.5–15.5)
WBC: 19.4 10*3/uL — ABNORMAL HIGH (ref 4.0–10.5)

## 2014-01-28 LAB — COMPREHENSIVE METABOLIC PANEL
ALT: 223 U/L — ABNORMAL HIGH (ref 0–35)
AST: 514 U/L — ABNORMAL HIGH (ref 0–37)
Albumin: 2.7 g/dL — ABNORMAL LOW (ref 3.5–5.2)
Alkaline Phosphatase: 48 U/L (ref 39–117)
Anion gap: 18 — ABNORMAL HIGH (ref 5–15)
BUN: 14 mg/dL (ref 6–23)
CO2: 21 mEq/L (ref 19–32)
Calcium: 7.7 mg/dL — ABNORMAL LOW (ref 8.4–10.5)
Chloride: 99 mEq/L (ref 96–112)
Creatinine, Ser: 1.34 mg/dL — ABNORMAL HIGH (ref 0.50–1.10)
GFR calc Af Amer: 44 mL/min — ABNORMAL LOW (ref >90)
GFR calc non Af Amer: 38 mL/min — ABNORMAL LOW (ref >90)
Glucose, Bld: 112 mg/dL — ABNORMAL HIGH (ref 70–99)
Potassium: 3.7 mEq/L (ref 3.7–5.3)
Sodium: 138 mEq/L (ref 137–147)
Total Bilirubin: 0.6 mg/dL (ref 0.3–1.2)
Total Protein: 4.8 g/dL — ABNORMAL LOW (ref 6.0–8.3)

## 2014-01-28 LAB — POCT I-STAT, CHEM 8
BUN: 21 mg/dL (ref 6–23)
Calcium, Ion: 1.03 mmol/L — ABNORMAL LOW (ref 1.13–1.30)
Chloride: 102 mEq/L (ref 96–112)
Creatinine, Ser: 1.5 mg/dL — ABNORMAL HIGH (ref 0.50–1.10)
Glucose, Bld: 88 mg/dL (ref 70–99)
HCT: 27 % — ABNORMAL LOW (ref 36.0–46.0)
Hemoglobin: 9.2 g/dL — ABNORMAL LOW (ref 12.0–15.0)
Potassium: 4.4 mEq/L (ref 3.7–5.3)
Sodium: 136 mEq/L — ABNORMAL LOW (ref 137–147)
TCO2: 25 mmol/L (ref 0–100)

## 2014-01-28 LAB — POCT I-STAT 3, ART BLOOD GAS (G3+)
Acid-base deficit: 1 mmol/L (ref 0.0–2.0)
Bicarbonate: 22.2 mEq/L (ref 20.0–24.0)
O2 Saturation: 98 %
Patient temperature: 37.7
TCO2: 23 mmol/L (ref 0–100)
pCO2 arterial: 32.5 mmHg — ABNORMAL LOW (ref 35.0–45.0)
pH, Arterial: 7.446 (ref 7.350–7.450)
pO2, Arterial: 105 mmHg — ABNORMAL HIGH (ref 80.0–100.0)

## 2014-01-28 LAB — CARBOXYHEMOGLOBIN
Carboxyhemoglobin: 1.2 % (ref 0.5–1.5)
Methemoglobin: 0.8 % (ref 0.0–1.5)
O2 Saturation: 64.9 %
Total hemoglobin: 9.1 g/dL — ABNORMAL LOW (ref 12.0–16.0)

## 2014-01-28 LAB — APTT: aPTT: 63 seconds — ABNORMAL HIGH (ref 24–37)

## 2014-01-28 MED ORDER — HYDROCODONE-ACETAMINOPHEN 7.5-325 MG/15ML PO SOLN
10.0000 mL | Freq: Four times a day (QID) | ORAL | Status: DC | PRN
  Administered 2014-02-06: 10 mL via ORAL
  Filled 2014-01-28: qty 15

## 2014-01-28 MED ORDER — DEXTROSE 5 % IV SOLN
1.0000 g | Freq: Three times a day (TID) | INTRAVENOUS | Status: DC
  Administered 2014-01-28 – 2014-02-06 (×28): 1 g via INTRAVENOUS
  Filled 2014-01-28 (×30): qty 1

## 2014-01-28 MED ORDER — POTASSIUM CHLORIDE 10 MEQ/50ML IV SOLN
10.0000 meq | INTRAVENOUS | Status: AC
  Administered 2014-01-28 (×3): 10 meq via INTRAVENOUS
  Filled 2014-01-28 (×2): qty 50

## 2014-01-28 MED ORDER — FUROSEMIDE 10 MG/ML IJ SOLN
40.0000 mg | Freq: Once | INTRAMUSCULAR | Status: AC
Start: 2014-01-28 — End: 2014-01-28
  Administered 2014-01-28: 40 mg via INTRAVENOUS

## 2014-01-28 NOTE — Progress Notes (Signed)
Orthopedic Tech Progress Note Patient Details:  [name] [birth year] [id number]  Ortho Devices Type of Ortho Device: Postop shoe/boot Ortho Device/Splint Location: bilateral heel boots Ortho Device/Splint Interventions: Application   Cammer, Mickie Bail 01/28/2014, 12:50 PM

## 2014-01-28 NOTE — Addendum Note (Signed)
Addendum created 01/28/14 1203 by Elisabeth Most, CRNA   Modules edited: Anesthesia Medication Administration

## 2014-01-28 NOTE — Progress Notes (Signed)
Patient Profile: Day 3 s/p emergency CABG x 4 (LIMA-LAD, SVG-OM1, OM2 and PDA) and MVR.   Subjective: Intubated and sedated.  Objective: Vital signs in last 24 hours: Temp:  [98.4 F (36.9 C)-100.6 F (38.1 C)] 98.4 F (36.9 C) (07/15 1145) Pulse Rate:  [40-95] 45 (07/15 1145) Resp:  [0-36] 36 (07/15 1145) BP: (103-140)/(48-79) 127/59 mmHg (07/15 1100) SpO2:  [96 %-100 %] 100 % (07/15 1145) Arterial Line BP: (78-137)/(35-71) 119/62 mmHg (07/15 1145) FiO2 (%):  [40 %] 40 % (07/15 0800) Weight:  [248 lb 3.8 oz (112.6 kg)] 248 lb 3.8 oz (112.6 kg) (07/15 0500)    Intake/Output from previous day: 07/14 0701 - 07/15 0700 In: 3910 [I.V.:3035; Blood:335; NG/GT:90; IV Piggyback:450] Out: 2275 [Urine:1555; Emesis/NG output:400; Chest Tube:320] Intake/Output this shift: Total I/O In: 450.1 [I.V.:320.1; NG/GT:30; IV Piggyback:100] Out: 290 [Urine:290]  Medications Current Facility-Administered Medications  Medication Dose Route Frequency Provider Last Rate Last Dose  . 0.45 % sodium chloride infusion   Intravenous Continuous Erin Barrett, PA-C 20 mL/hr at 01/28/14 0606    . 0.9 %  sodium chloride infusion   Intravenous Continuous Ward Givens, MD 20 mL/hr at 01/25/14 1847 20 mL/hr at 01/25/14 1847  . 0.9 %  sodium chloride infusion   Intravenous Continuous Erin Barrett, PA-C 20 mL/hr at 01/26/14 1056 20 mL at 01/26/14 1056  . 0.9 %  sodium chloride infusion  250 mL Intravenous Continuous Erin Barrett, PA-C      . acetaminophen (TYLENOL) tablet 1,000 mg  1,000 mg Oral 4 times per day Erin Barrett, PA-C       Or  . acetaminophen (TYLENOL) solution 1,000 mg  1,000 mg Per Tube 4 times per day Erin Barrett, PA-C   1,000 mg at 01/28/14 0631  . ALPRAZolam (XANAX) tablet 1 mg  1 mg Oral QID Erin Barrett, PA-C      . amiodarone (NEXTERONE PREMIX) 360 MG/200ML (1.8 mg/mL) IV infusion  30 mg/hr Intravenous Continuous Erin Barrett, PA-C 16.7 mL/hr at 01/28/14 1000 30 mg/hr at 01/28/14 1000  .  antiseptic oral rinse (BIOTENE) solution 15 mL  15 mL Mouth Rinse QID Kerin Perna, MD   15 mL at 01/28/14 0356  . aspirin EC tablet 325 mg  325 mg Oral Daily Erin Barrett, PA-C   325 mg at 01/27/14 8657   Or  . aspirin chewable tablet 324 mg  324 mg Per Tube Daily Erin Barrett, PA-C   324 mg at 01/28/14 1025  . atorvastatin (LIPITOR) tablet 80 mg  80 mg Oral q1800 [name], MD      . bisacodyl (DULCOLAX) EC tablet 10 mg  10 mg Oral Daily Erin Barrett, PA-C       Or  . bisacodyl (DULCOLAX) suppository 10 mg  10 mg Rectal Daily Erin Barrett, PA-C      . bivalirudin (ANGIOMAX) 0.5 mg/mL in sodium chloride 0.9 % 500 mL infusion  0.04 mg/kg/hr Intravenous Continuous Kerin Perna, MD 8.6 mL/hr at 01/28/14 1000 0.04 mg/kg/hr at 01/28/14 1000  . cefTAZidime (FORTAZ) 1 g in dextrose 5 % 50 mL IVPB  1 g Intravenous 3 times per day Kerin Perna, MD      . chlorhexidine (PERIDEX) 0.12 % solution 15 mL  15 mL Mouth Rinse BID Kerin Perna, MD   15 mL at 01/28/14 0735  . dexmedetomidine (PRECEDEX) 200 MCG/50ML infusion  0.1-0.7 mcg/kg/hr Intravenous Continuous Erin Barrett, PA-C   0.01 mcg/kg/hr at 01/27/14 1400  .  docusate sodium (COLACE) capsule 200 mg  200 mg Oral Daily Erin Barrett, PA-C      . DOPamine (INTROPIN) 800 mg in dextrose 5 % 250 mL infusion  5 mcg/kg/min Intravenous Titrated Erin Barrett, PA-C 7.7 mL/hr at 01/28/14 1000 5 mcg/kg/min at 01/28/14 1000  . EPINEPHrine (ADRENALIN) 1,000 mcg in dextrose 5 % 250 mL infusion  2 mcg/min Intravenous Titrated Kerin PernaPeter Van Trigt, MD 30 mL/hr at 01/28/14 1028 2 mcg/min at 01/28/14 1028  . furosemide (LASIX) 250 mg in dextrose 5 % 250 mL infusion  8 mg/hr Intravenous Continuous Kerin PernaPeter Van Trigt, MD 8 mL/hr at 01/28/14 1000 8 mg/hr at 01/28/14 1000  . HYDROcodone-acetaminophen (HYCET) 7.5-325 mg/15 ml solution 10 mL  10 mL Oral Q6H PRN Kerin PernaPeter Van Trigt, MD      . insulin glargine (LANTUS) injection 10 Units  10 Units Subcutaneous BID Kerin PernaPeter Van  Trigt, MD   10 Units at 01/28/14 1021  . insulin regular (NOVOLIN R,HUMULIN R) 1 Units/mL in sodium chloride 0.9 % 100 mL infusion   Intravenous Continuous Erin Barrett, PA-C 5.6 mL/hr at 01/28/14 1100 5.6 Units/hr at 01/28/14 1100  . insulin regular bolus via infusion 0-10 Units  0-10 Units Intravenous TID WC Erin Barrett, PA-C      . lactated ringers infusion   Intravenous Continuous Erin Barrett, PA-C 10 mL/hr at 01/28/14 0600    . levothyroxine (SYNTHROID, LEVOTHROID) tablet 75 mcg  75 mcg Oral QAC breakfast Erin Barrett, PA-C   75 mcg at 01/28/14 0735  . metoprolol (LOPRESSOR) injection 2.5-5 mg  2.5-5 mg Intravenous Q2H PRN Erin Barrett, PA-C      . metoprolol tartrate (LOPRESSOR) tablet 12.5 mg  12.5 mg Oral BID Erin Barrett, PA-C       Or  . metoprolol tartrate (LOPRESSOR) 25 mg/10 mL oral suspension 12.5 mg  12.5 mg Per Tube BID Erin Barrett, PA-C      . midazolam (VERSED) injection 2 mg  2 mg Intravenous Q1H PRN Erin Barrett, PA-C      . milrinone (PRIMACOR) 200 mcg/mL load via infusion 4,100 mcg  50 mcg/kg Intravenous Once Erin Barrett, PA-C      . milrinone (PRIMACOR) infusion 200 mcg/mL (0.2 mg/ml)  0.3 mcg/kg/min Intravenous Continuous Erin Barrett, PA-C 7.3 mL/hr at 01/28/14 1000 0.3 mcg/kg/min at 01/28/14 1000  . morphine 2 MG/ML injection 2-5 mg  2-5 mg Intravenous Q1H PRN Erin Barrett, PA-C      . norepinephrine (LEVOPHED) 16 mg in dextrose 5 % 250 mL infusion  2-50 mcg/min Intravenous Titrated Kerin PernaPeter Van Trigt, MD 6.6 mL/hr at 01/28/14 1000 7 mcg/min at 01/28/14 1000  . ondansetron (ZOFRAN) injection 4 mg  4 mg Intravenous Q6H PRN Erin Barrett, PA-C      . pantoprazole sodium (PROTONIX) 40 mg/20 mL oral suspension 40 mg  40 mg Per Tube Daily Kerin PernaPeter Van Trigt, MD   40 mg at 01/27/14 1256  . potassium chloride 10 mEq in 50 mL *CENTRAL LINE* IVPB  10 mEq Intravenous Q1 Hr x 3 Kerin PernaPeter Van Trigt, MD   10 mEq at 01/28/14 1123  . sodium chloride 0.9 % injection 10-40 mL  10-40 mL  Intracatheter Q12H Kerin PernaPeter Van Trigt, MD   10 mL at 01/28/14 1025  . sodium chloride 0.9 % injection 10-40 mL  10-40 mL Intracatheter PRN Kerin PernaPeter Van Trigt, MD      . sodium chloride 0.9 % injection 3 mL  3 mL Intravenous Q12H Erin Barrett, PA-C   3  mL at 01/27/14 0955  . sodium chloride 0.9 % injection 3 mL  3 mL Intravenous PRN Erin Barrett, PA-C        PE: General appearance: intubated and sedated Lungs: clear to auscultation bilaterally Heart: regular rate and rhythm Extremities: tense bilateral LEE Pulses: 2+ and symmetric Skin: warm and dry Neurologic: sedated  Lab Results:   Recent Labs  01/27/14 0415 01/27/14 1530 01/27/14 1536 01/28/14 0339  WBC 15.8* 16.9*  --  19.4*  HGB 8.4* 9.1* 9.2* 9.0*  HCT 24.5* 25.6* 27.0* 25.8*  PLT 101* 86*  --  80*   BMET  Recent Labs  01/26/14 1100  01/27/14 0415 01/27/14 1530 01/27/14 1536 01/28/14 0339  NA 145  < > 141  --  141 138  K 3.0*  < > 3.5*  --  3.5* 3.7  CL 106  < > 102  --  102 99  CO2 20  --  18*  --   --  21  GLUCOSE 252*  < > 234*  --  135* 112*  BUN 12  < > 13  --  11 14  CREATININE 0.86  < > 1.20* 1.31* 1.40* 1.34*  CALCIUM 7.8*  --  7.9*  --   --  7.7*  < > = values in this interval not displayed. PT/INR  Recent Labs  01/25/14 1839 01/25/14 2010 01/26/14 1030  LABPROT 13.7 14.7 20.3*  INR 1.05 1.15 1.73*   Cholesterol  Recent Labs  01/26/14 1100  CHOL 82     Assessment/Plan  Active Problems:   ST elevation myocardial infarction (STEMI) of inferolateral wall, initial episode of care   CAD, multiple vessel - 100% distal RCA; also 100% D1, 90% proximal LAD and mid Circumflex, 80% OM 2   Essential hypertension   PAD (peripheral artery disease)   Diabetes mellitus type 2 with peripheral artery disease   Hyperlipidemia associated with type 2 diabetes mellitus   S/P CABG x 4  Plan: Day 3 s/p emergency CABG x 4.Intubated and sedated. Still on IABP at 1:2. Plan per CT surgery is to continue until  CI reaches 2.0. She continues on multiple pressor support. She continues to have frequent PVCs on telemetry but no NSVT or afib. Continue IV Amiodarone. Continue IV Lasix for diuresis.     LOS: 3 days    Brittainy M. Delmer Islam 01/28/2014 11:50 AM   Patient seen and examined. Agree with assessment and plan. Sedated on vent, but opens eyes. Currently on IABP 1:2 and 4 pressors with CI 1.8; plan is to decrease to 1: 3 if CI>2. Edematous; I/O +11013 since admission, ov Lasix drip and supplemental IV.   Lennette Bihari, MD, [facility] 01/28/2014 12:25 PM

## 2014-01-28 NOTE — Progress Notes (Signed)
INITIAL NUTRITION ASSESSMENT  DOCUMENTATION CODES Per approved criteria  -Obesity Unspecified   INTERVENTION: Recommend initiation of nutrition support within 24-48 hours of intubation. If enteral nutrition desired, recommend initiation of Vital High Protein via enteral feeding tube at 25 ml/hr, and advance by 10 ml q 4 hours, to goal of 45 ml/hr. Add 30 ml Prostat liquid protein via tube BID. Goal regimen will provide: 1280 kcal, 125 grams protein, 903 ml free water. RD to continue to follow nutrition care plan.  NUTRITION DIAGNOSIS: Inadequate oral intake related to inability to eat as evidenced by npo status.   Goal: Initiation of nutrition support within 24-48 hours of intubation. Enteral nutrition to provide 60-70% of estimated calorie needs (22-25 kcals/kg ideal body weight) and 100% of estimated protein needs, based on ASPEN guidelines for permissive underfeeding in critically ill obese individuals.  Monitor:  weight trends, lab trends, I/O's, ventilator use, initiation of nutrition support  Reason for Assessment: Ventilator Use  73 y.o. female  Admitting Dx: STEMI  ASSESSMENT: PMHx significant for CAD, TIA, HTN, DM2, PAD. Admitted with n/v, diaphoresis and chest pain. Work-up reveals STEMI.  Underwent L heart catheterization with coronary angiogram on 7/12. Work-up reveals severe multivessel CAD. Pt then went to OR for emergent CABG x 4 on 7/13.  Patient is currently intubated on ventilator support MV: 9.4 L/min Temp (24hrs), Avg:99.3 F (37.4 C), Min:98.1 F (36.7 C), Max:100.2 F (37.9 C)  Propofol: none   CBG's: 98 - 114 Sodium and potassium WNL HgbA1c is 8.4  Height: Ht Readings from Last 1 Encounters:  01/26/14 5\' 9"  (1.753 m)    Weight: Wt Readings from Last 1 Encounters:  01/28/14 248 lb 3.8 oz (112.6 kg)    Ideal Body Weight: 145 lb/65.9 kg  % Ideal Body Weight: 171%  Wt Readings from Last 10 Encounters:  01/28/14 248 lb 3.8 oz (112.6 kg)   01/28/14 248 lb 3.8 oz (112.6 kg)  01/28/14 248 lb 3.8 oz (112.6 kg)    Usual Body Weight: n/a  % Usual Body Weight: n/a  BMI:  Body mass index is 36.64 kg/(m^2). Obese Class II  Estimated Nutritional Needs: Kcal: 1940 Permissive underfeeding kcal goal: 1162 - 1357 kcal Protein: at least 131 grams protein Fluid: per MD  Skin: leg and chest incisions  Diet Order: NPO  EDUCATION NEEDS: -Education not appropriate at this time   Intake/Output Summary (Last 24 hours) at 01/28/14 1446 Last data filed at 01/28/14 1400  Gross per 24 hour  Intake 3430.36 ml  Output   2610 ml  Net 820.36 ml    Last BM: PTA  Labs:   Recent Labs Lab 01/26/14 1100 01/26/14 1615  01/27/14 0415 01/27/14 1530 01/27/14 1536 01/28/14 0339  NA 145  --   < > 141  --  141 138  K 3.0*  --   < > 3.5*  --  3.5* 3.7  CL 106  --   < > 102  --  102 99  CO2 20  --   --  18*  --   --  21  BUN 12  --   < > 13  --  11 14  CREATININE 0.86 0.98  < > 1.20* 1.31* 1.40* 1.34*  CALCIUM 7.8*  --   --  7.9*  --   --  7.7*  MG 2.3 2.3  --  2.0 1.8  --   --   GLUCOSE 252*  --   < > 234*  --  135* 112*  < > = values in this interval not displayed.  CBG (last 3)   Recent Labs  01/28/14 0545 01/28/14 0655 01/28/14 0800  GLUCAP 104* 98 119*   Lab Results  Component Value Date   HGBA1C 8.4* 01/26/2014    Scheduled Meds: . acetaminophen  1,000 mg Oral 4 times per day   Or  . acetaminophen (TYLENOL) oral liquid 160 mg/5 mL  1,000 mg Per Tube 4 times per day  . ALPRAZolam  1 mg Oral QID  . antiseptic oral rinse  15 mL Mouth Rinse QID  . aspirin EC  325 mg Oral Daily   Or  . aspirin  324 mg Per Tube Daily  . atorvastatin  80 mg Oral q1800  . bisacodyl  10 mg Oral Daily   Or  . bisacodyl  10 mg Rectal Daily  . cefTAZidime (FORTAZ)  IV  1 g Intravenous 3 times per day  . chlorhexidine  15 mL Mouth Rinse BID  . docusate sodium  200 mg Oral Daily  . insulin glargine  10 Units Subcutaneous BID  .  insulin regular  0-10 Units Intravenous TID WC  . levothyroxine  75 mcg Oral QAC breakfast  . metoprolol tartrate  12.5 mg Oral BID   Or  . metoprolol tartrate  12.5 mg Per Tube BID  . milrinone  50 mcg/kg Intravenous Once  . pantoprazole sodium  40 mg Per Tube Daily  . sodium chloride  10-40 mL Intracatheter Q12H  . sodium chloride  3 mL Intravenous Q12H    Continuous Infusions: . sodium chloride 20 mL/hr at 01/28/14 0606  . sodium chloride 20 mL/hr (01/25/14 1847)  . sodium chloride 20 mL (01/26/14 1056)  . sodium chloride    . amiodarone 30 mg/hr (01/28/14 1400)  . bivalirudin (ANGIOMAX) infusion 0.5 mg/mL (Non-ACS indications) 0.04 mg/kg/hr (01/28/14 1400)  . dexmedetomidine Stopped (01/27/14 1500)  . DOPamine 5 mcg/kg/min (01/28/14 1400)  . epinephrine 2 mcg/min (01/28/14 1028)  . furosemide (LASIX) infusion 8 mg/hr (01/28/14 1200)  . insulin (NOVOLIN-R) infusion 9.1 Units/hr (01/28/14 1400)  . lactated ringers 10 mL/hr at 01/28/14 0600  . milrinone 0.3 mcg/kg/min (01/28/14 1400)  . norepinephrine (LEVOPHED) Adult infusion 4 mcg/min (01/28/14 1400)    Past Medical History  Diagnosis Date  . History of MI (myocardial infarction)     [facility] (cannot get cath report on Care Everywhere(  . Essential hypertension   . TIA (transient ischemic attack)   . Thyroid disease   . Anxiety   . Diabetes mellitus type 2 with peripheral artery disease     On insulin  . PAD (peripheral artery disease)   . Hyperlipidemia associated with type 2 diabetes mellitus     Past Surgical History  Procedure Laterality Date  . Cardiac catheterization      PCI - unknown vessel or stent; unknown date; [facility].  . Abdominal hysterectomy    . Cholecystectomy    . Coronary artery bypass graft N/A 01/25/2014    Procedure: CORONARY ARTERY BYPASS GRAFTING TIMES FOUR USING LEFT INTERNAL MAMMARY ARTERY TO LAD, SAPHENOUS VEIN GRAFTS TO OM1, OM2, AND PDA;  Surgeon: Kerin PernaPeter Van Trigt, MD;   [facility];  Service: Open Heart Surgery;  Laterality: N/A;  . Mitral valve replacement N/A 01/25/2014    Procedure: MITRAL VALVE (MV) REPLACEMENT;  Surgeon: Kerin PernaPeter Van Trigt, MD;  [facility];  Service: Open Heart Surgery;  Laterality: N/A;  Inda Coke MS, RD, LDN Inpatient Registered Dietitian Pager: [phone] After-hours pager: [phone]

## 2014-01-28 NOTE — Progress Notes (Signed)
EKG CRITICAL VALUE     12 lead EKG performed.  Critical value noted.  Sharlet Salina, RN notified.   Almedia Balls, [place] 01/28/2014 6:56 AM

## 2014-01-28 NOTE — Progress Notes (Signed)
3 Days Post-Op Procedure(s) (LRB): CORONARY ARTERY BYPASS GRAFTING TIMES FOUR USING LEFT INTERNAL MAMMARY ARTERY TO LAD, SAPHENOUS VEIN GRAFTS TO OM1, OM2, AND PDA (N/A) MITRAL VALVE (MV) REPLACEMENT (N/A) Subjective: Postop day 2 emergency CABG and mitral valve replacement for acute MI, cardiogenic shock Preop balloon placement Uncontrolled diabetes, obesity, peripheral vascular disease of lower extremities  Patient stable overnight-norepinephrine weaned in half Cardiac index remains 1.7, 1.8 with balloon pump at 1: 2 Co-ox saturation this morning 64% Patient opening her eyes, unable to move extremities Chest x-ray with interstitial edema, FiO2 40% Objective: Vital signs in last 24 hours: Temp:  [99.1 F (37.3 C)-100.6 F (38.1 C)] 99.1 F (37.3 C) (07/15 0900) Pulse Rate:  [40-95] 45 (07/15 0900) Cardiac Rhythm:  [-] Junctional rhythm (07/15 0800) Resp:  [0-33] 16 (07/15 0900) BP: (103-140)/(48-79) 112/79 mmHg (07/15 0900) SpO2:  [94 %-100 %] 100 % (07/15 0900) Arterial Line BP: (78-137)/(35-71) 107/58 mmHg (07/15 0900) FiO2 (%):  [40 %] 40 % (07/15 0800) Weight:  [248 lb 3.8 oz (112.6 kg)] 248 lb 3.8 oz (112.6 kg) (07/15 0500)  Hemodynamic parameters for last 24 hours: PAP: (26-50)/(12-27) 41/23 mmHg CVP:  [0 mmHg-18 mmHg] 12 mmHg CO:  [3.1 L/min-3.7 L/min] 3.3 L/min CI:  [1.6 L/min/m2-1.9 L/min/m2] 1.7 L/min/m2  Intake/Output from previous day: 07/14 0701 - 07/15 0700 In: 3910 [I.V.:3035; Blood:335; NG/GT:90; IV Piggyback:450] Out: 2275 [Urine:1555; Emesis/NG output:400; Chest Tube:320] Intake/Output this shift: Total I/O In: 291.9 [I.V.:211.9; NG/GT:30; IV Piggyback:50] Out: 175 [Urine:175]   Patient open eyes to voice Lungs with scattered rhonchi Lower extremities cool below the ankles, Doppler pulse present Bowel sounds absent No cardiac murmur  Lab Results:  Recent Labs  01/27/14 1530 01/27/14 1536 01/28/14 0339  WBC 16.9*  --  19.4*  HGB 9.1* 9.2*  9.0*  HCT 25.6* 27.0* 25.8*  PLT 86*  --  80*   BMET:  Recent Labs  01/27/14 0415  01/27/14 1536 01/28/14 0339  NA 141  --  141 138  K 3.5*  --  3.5* 3.7  CL 102  --  102 99  CO2 18*  --   --  21  GLUCOSE 234*  --  135* 112*  BUN 13  --  11 14  CREATININE 1.20*  < > 1.40* 1.34*  CALCIUM 7.9*  --   --  7.7*  < > = values in this interval not displayed.  PT/INR:  Recent Labs  01/26/14 1030  LABPROT 20.3*  INR 1.73*   ABG    Component Value Date/Time   PHART 7.446 01/28/2014 0332   HCO3 22.2 01/28/2014 0332   TCO2 23 01/28/2014 0332   ACIDBASEDEF 1.0 01/28/2014 0332   O2SAT 64.9 01/28/2014 0440   CBG (last 3)   Recent Labs  01/28/14 0545 01/28/14 0655 01/28/14 0800  GLUCAP 104* 98 119*    Assessment/Plan: S/P Procedure(s) (LRB): CORONARY ARTERY BYPASS GRAFTING TIMES FOUR USING LEFT INTERNAL MAMMARY ARTERY TO LAD, SAPHENOUS VEIN GRAFTS TO OM1, OM2, AND PDA (N/A) MITRAL VALVE (MV) REPLACEMENT (N/A) Continue balloon pump 1-2 until cardiac index approaches 2.0 Continue low-dose bivalirudin for low platelet count and atrial-junctional arrhythmia and mitral valve replacement, biologic Increase diuretic dose, Lasix drip Add Fortaz for leukocytosis, fever, and thick airway secretions with possible right lower lobe pneumonia  LOS: 3 days    VAN TRIGT III,PETER 01/28/2014

## 2014-01-28 NOTE — Progress Notes (Signed)
Patient ID: [name], female   DOB: 1941-01-09, 73 y.o.   MRN: [id number]  SICU Evening Rounds:  She has fairly stable hemodynamics on current inotropes and IABP 1:2. Epi turned off earlier tonight and CI dropped to 1.3 so it was restarted on 1 mcg. CI now 1.6 which is about where it has been all day. PA 37/19, CVP 6.  She remains on vent  Urine output 50/hr on lasix drip at 8.  BMET    Component Value Date/Time   NA 136* 01/28/2014 1618   K 4.4 01/28/2014 1618   CL 102 01/28/2014 1618   CO2 21 01/28/2014 0339   GLUCOSE 88 01/28/2014 1618   BUN 21 01/28/2014 1618   CREATININE 1.50* 01/28/2014 1618   CALCIUM 7.7* 01/28/2014 0339   GFRNONAA 38* 01/28/2014 0339   GFRAA 44* 01/28/2014 0339    CBC    Component Value Date/Time   WBC 19.4* 01/28/2014 0339   RBC 3.00* 01/28/2014 0339   HGB 9.2* 01/28/2014 1618   HCT 27.0* 01/28/2014 1618   PLT 80* 01/28/2014 0339   MCV 86.0 01/28/2014 0339   MCH 30.0 01/28/2014 0339   MCHC 34.9 01/28/2014 0339   RDW 15.3 01/28/2014 0339   LYMPHSABS 2.7 01/26/2014 1030   MONOABS 1.0 01/26/2014 1030   EOSABS 0.0 01/26/2014 1030   BASOSABS 0.0 01/26/2014 1030    Plan to continue current support tonight.

## 2014-01-29 ENCOUNTER — Inpatient Hospital Stay (HOSPITAL_COMMUNITY): Payer: Medicare Other

## 2014-01-29 LAB — TYPE AND SCREEN
ABO/RH(D): A POS
ANTIBODY SCREEN: NEGATIVE
Unit division: 0
Unit division: 0
Unit division: 0
Unit division: 0
Unit division: 0
Unit division: 0
Unit division: 0
Unit division: 0

## 2014-01-29 LAB — BLOOD GAS, ARTERIAL
Acid-base deficit: 0.2 mmol/L (ref 0.0–2.0)
Bicarbonate: 23.1 mEq/L (ref 20.0–24.0)
Drawn by: 41875
FIO2: 40 %
MECHVT: 600 mL
O2 Saturation: 99.7 %
PEEP: 5 cmH2O
Patient temperature: 98.1
RATE: 15 resp/min
TCO2: 24 mmol/L (ref 0–100)
pCO2 arterial: 31.8 mmHg — ABNORMAL LOW (ref 35.0–45.0)
pH, Arterial: 7.472 — ABNORMAL HIGH (ref 7.350–7.450)
pO2, Arterial: 183 mmHg — ABNORMAL HIGH (ref 80.0–100.0)

## 2014-01-29 LAB — GLUCOSE, CAPILLARY
Glucose-Capillary: 100 mg/dL — ABNORMAL HIGH (ref 70–99)
Glucose-Capillary: 101 mg/dL — ABNORMAL HIGH (ref 70–99)
Glucose-Capillary: 101 mg/dL — ABNORMAL HIGH (ref 70–99)
Glucose-Capillary: 105 mg/dL — ABNORMAL HIGH (ref 70–99)
Glucose-Capillary: 109 mg/dL — ABNORMAL HIGH (ref 70–99)
Glucose-Capillary: 114 mg/dL — ABNORMAL HIGH (ref 70–99)
Glucose-Capillary: 121 mg/dL — ABNORMAL HIGH (ref 70–99)
Glucose-Capillary: 124 mg/dL — ABNORMAL HIGH (ref 70–99)
Glucose-Capillary: 124 mg/dL — ABNORMAL HIGH (ref 70–99)
Glucose-Capillary: 129 mg/dL — ABNORMAL HIGH (ref 70–99)
Glucose-Capillary: 134 mg/dL — ABNORMAL HIGH (ref 70–99)
Glucose-Capillary: 86 mg/dL (ref 70–99)
Glucose-Capillary: 87 mg/dL (ref 70–99)
Glucose-Capillary: 91 mg/dL (ref 70–99)
Glucose-Capillary: 91 mg/dL (ref 70–99)
Glucose-Capillary: 92 mg/dL (ref 70–99)
Glucose-Capillary: 92 mg/dL (ref 70–99)
Glucose-Capillary: 92 mg/dL (ref 70–99)
Glucose-Capillary: 95 mg/dL (ref 70–99)
Glucose-Capillary: 95 mg/dL (ref 70–99)
Glucose-Capillary: 99 mg/dL (ref 70–99)

## 2014-01-29 LAB — CBC
HCT: 26.4 % — ABNORMAL LOW (ref 36.0–46.0)
Hemoglobin: 9.1 g/dL — ABNORMAL LOW (ref 12.0–15.0)
MCH: 30.4 pg (ref 26.0–34.0)
MCHC: 34.5 g/dL (ref 30.0–36.0)
MCV: 88.3 fL (ref 78.0–100.0)
Platelets: 73 10*3/uL — ABNORMAL LOW (ref 150–400)
RBC: 2.99 MIL/uL — ABNORMAL LOW (ref 3.87–5.11)
RDW: 15.9 % — ABNORMAL HIGH (ref 11.5–15.5)
WBC: 16 10*3/uL — ABNORMAL HIGH (ref 4.0–10.5)

## 2014-01-29 LAB — COMPREHENSIVE METABOLIC PANEL
ALT: 199 U/L — ABNORMAL HIGH (ref 0–35)
AST: 228 U/L — ABNORMAL HIGH (ref 0–37)
Albumin: 2.2 g/dL — ABNORMAL LOW (ref 3.5–5.2)
Alkaline Phosphatase: 83 U/L (ref 39–117)
Anion gap: 16 — ABNORMAL HIGH (ref 5–15)
BUN: 19 mg/dL (ref 6–23)
CO2: 23 mEq/L (ref 19–32)
Calcium: 7.3 mg/dL — ABNORMAL LOW (ref 8.4–10.5)
Chloride: 100 mEq/L (ref 96–112)
Creatinine, Ser: 1.33 mg/dL — ABNORMAL HIGH (ref 0.50–1.10)
GFR calc Af Amer: 44 mL/min — ABNORMAL LOW (ref >90)
GFR calc non Af Amer: 38 mL/min — ABNORMAL LOW (ref >90)
Glucose, Bld: 77 mg/dL (ref 70–99)
Potassium: 3.5 mEq/L — ABNORMAL LOW (ref 3.7–5.3)
Sodium: 139 mEq/L (ref 137–147)
Total Bilirubin: 0.6 mg/dL (ref 0.3–1.2)
Total Protein: 4.8 g/dL — ABNORMAL LOW (ref 6.0–8.3)

## 2014-01-29 LAB — POCT ACTIVATED CLOTTING TIME: Activated Clotting Time: 163 seconds

## 2014-01-29 LAB — CULTURE, RESPIRATORY W GRAM STAIN
Culture: NO GROWTH
Special Requests: NORMAL

## 2014-01-29 LAB — CARBOXYHEMOGLOBIN
Carboxyhemoglobin: 0.8 % (ref 0.5–1.5)
Methemoglobin: 1 % (ref 0.0–1.5)
O2 Saturation: 45 %
Total hemoglobin: 10.7 g/dL — ABNORMAL LOW (ref 12.0–16.0)

## 2014-01-29 LAB — APTT: aPTT: 61 seconds — ABNORMAL HIGH (ref 24–37)

## 2014-01-29 MED ORDER — ALBUMIN HUMAN 25 % IV SOLN
12.5000 g | Freq: Four times a day (QID) | INTRAVENOUS | Status: AC
  Administered 2014-01-29 (×3): 12.5 g via INTRAVENOUS
  Filled 2014-01-29 (×7): qty 50

## 2014-01-29 MED ORDER — POTASSIUM CHLORIDE 10 MEQ/50ML IV SOLN
10.0000 meq | INTRAVENOUS | Status: AC
  Administered 2014-01-29 (×3): 10 meq via INTRAVENOUS
  Filled 2014-01-29: qty 50

## 2014-01-29 MED ORDER — DOCUSATE SODIUM 50 MG/5ML PO LIQD
200.0000 mg | Freq: Every day | ORAL | Status: DC
  Administered 2014-01-29 – 2014-02-07 (×4): 200 mg via ORAL
  Filled 2014-01-29 (×11): qty 20

## 2014-01-29 MED ORDER — EPINEPHRINE HCL 1 MG/ML IJ SOLN
1.0000 ug/min | INTRAVENOUS | Status: DC
  Administered 2014-01-29 – 2014-02-01 (×6): 1.5 ug/min via INTRAVENOUS
  Administered 2014-02-02: 1 ug/min via INTRAVENOUS
  Administered 2014-02-02 – 2014-02-03 (×3): 1.5 ug/min via INTRAVENOUS
  Filled 2014-01-29 (×13): qty 1

## 2014-01-29 NOTE — Progress Notes (Signed)
Patient ID: [name], female   DOB: 29-Nov-1940, 73 y.o.   MRN: [id number] EVENING ROUNDS NOTE :     [address]       [place] [id number]             [phone]                 4 Days Post-Op Procedure(s) (LRB): CORONARY ARTERY BYPASS GRAFTING TIMES FOUR USING LEFT INTERNAL MAMMARY ARTERY TO LAD, SAPHENOUS VEIN GRAFTS TO OM1, OM2, AND PDA (N/A) MITRAL VALVE (MV) REPLACEMENT (N/A)  Total Length of Stay:  LOS: 4 days  BP 100/51  Pulse 41  Temp(Src) 98.1 F (36.7 C) (Core (Comment))  Resp 16  Ht 5\' 9"  (1.753 m)  Wt 248 lb 3.8 oz (112.6 kg)  BMI 36.64 kg/m2  SpO2 100%  .Intake/Output     07/16 0701 - 07/17 0700   I.V. (mL/kg) 1879.1 (16.7)   NG/GT 320   IV Piggyback 250   Total Intake(mL/kg) 2449.1 (21.8)   Urine (mL/kg/hr) 1175 (0.7)   Emesis/NG output 450 (0.3)   Chest Tube 190 (0.1)   Total Output 1815   Net +634.1         . sodium chloride 20 mL/hr at 01/29/14 1245  . sodium chloride 20 mL/hr (01/25/14 1847)  . sodium chloride 20 mL/hr at 01/29/14 1322  . sodium chloride    . amiodarone 30 mg/hr (01/29/14 1600)  . bivalirudin (ANGIOMAX) infusion 0.5 mg/mL (Non-ACS indications) 0.04 mg/kg/hr (01/29/14 1700)  . dexmedetomidine Stopped (01/27/14 1500)  . DOPamine 5 mcg/kg/min (01/29/14 1600)  . epinephrine 1.5 mcg/min (01/29/14 2106)  . furosemide (LASIX) infusion 8 mg/hr (01/29/14 1200)  . insulin (NOVOLIN-R) infusion 2.6 Units/hr (01/29/14 2200)  . lactated ringers 10 mL/hr at 01/29/14 1322  . milrinone 0.3 mcg/kg/min (01/29/14 1600)  . norepinephrine (LEVOPHED) Adult infusion Stopped (01/29/14 2000)     Lab Results  Component Value Date   WBC 16.0* 01/29/2014   HGB 9.1* 01/29/2014   HCT 26.4* 01/29/2014   PLT 73* 01/29/2014   GLUCOSE 77 01/29/2014   CHOL 82 01/26/2014   TRIG 115 01/26/2014   HDL 20* 01/26/2014   LDLCALC 39 01/26/2014   ALT 199* 01/29/2014   AST 228* 01/29/2014   NA 139 01/29/2014   K 3.5* 01/29/2014   CL 100 01/29/2014   CREATININE 1.33* 01/29/2014   BUN 19 01/29/2014   CO2 23 01/29/2014   INR 1.73* 01/26/2014   HGBA1C 8.4* 01/26/2014   CI 1.7, less drips , IAB now on 1:2  Delight Ovens MD  Beeper [phone] Office [phone] 01/29/2014 10:15 PM

## 2014-01-29 NOTE — Progress Notes (Signed)
Chaplain was called by pt nurse for food voucher for pt's daughter.  Chaplain provided the food voucher and asked pt's daughter if she would like a consult with csw for further assistance, which she agreed.  Pt's daughter asked chaplain to come back at a later time and prayer with her mother, which chaplain did; however, it wasn't visiting hours and pt's daughter was not present.  Chaplain will pass on to on call chaplain.

## 2014-01-29 NOTE — Progress Notes (Addendum)
TCTS DAILY ICU PROGRESS NOTE                   [address]            [place] [address]          [phone]   4 Days Post-Op Procedure(s) (LRB): CORONARY ARTERY BYPASS GRAFTING TIMES FOUR USING LEFT INTERNAL MAMMARY ARTERY TO LAD, SAPHENOUS VEIN GRAFTS TO OM1, OM2, AND PDA (N/A) MITRAL VALVE (MV) REPLACEMENT (N/A)  Total Length of Stay:  LOS: 4 days   Subjective: Opens eyes on vent but not following commands.  No sedation in past 24 hrs.   Objective: Vital signs in last 24 hours: Temp:  [97.9 F (36.6 C)-99.3 F (37.4 C)] 97.9 F (36.6 C) (07/16 0715) Pulse Rate:  [28-89] 29 (07/16 0715) Cardiac Rhythm:  [-] Junctional rhythm (07/16 0400) Resp:  [13-36] 15 (07/16 0715) BP: (104-158)/(47-79) 109/57 mmHg (07/16 0700) SpO2:  [95 %-100 %] 100 % (07/16 0715) Arterial Line BP: (89-149)/(45-89) 89/45 mmHg (07/16 0715) FiO2 (%):  [40 %] 40 % (07/16 0402) Weight:  [248 lb 3.8 oz (112.6 kg)] 248 lb 3.8 oz (112.6 kg) (07/16 0500)  Filed Weights   01/27/14 0500 01/28/14 0500 01/29/14 0500  Weight: 237 lb 3.4 oz (107.6 kg) 248 lb 3.8 oz (112.6 kg) 248 lb 3.8 oz (112.6 kg)    Weight change: 0 lb (0 kg)   Hemodynamic parameters for last 24 hours: PAP: (35-51)/(18-26) 40/19 mmHg CVP:  [4 mmHg-15 mmHg] 6 mmHg CO:  [2.8 L/min-3.6 L/min] 3.3 L/min CI:  [1.4 L/min/m2-1.8 L/min/m2] 1.7 L/min/m2  Intake/Output from previous day: 07/15 0701 - 07/16 0700 In: 3108.6 [I.V.:2598.6; NG/GT:110; IV Piggyback:400] Out: 2585 [Urine:1935; Emesis/NG output:150; Chest Tube:500]  Drips: Dopamine, Epi, Milrinone, Amio, Lasix, Norepi, Bivalirudin, Insulin     Current Meds: Scheduled Meds: . acetaminophen  1,000 mg Oral 4 times per day   Or  . acetaminophen (TYLENOL) oral liquid 160 mg/5 mL  1,000 mg Per Tube 4 times per day  . ALPRAZolam  1 mg Oral QID  . antiseptic oral rinse  15 mL Mouth Rinse QID  . aspirin EC  325 mg Oral Daily   Or  . aspirin  324 mg Per Tube Daily    . atorvastatin  80 mg Oral q1800  . bisacodyl  10 mg Oral Daily   Or  . bisacodyl  10 mg Rectal Daily  . cefTAZidime (FORTAZ)  IV  1 g Intravenous 3 times per day  . chlorhexidine  15 mL Mouth Rinse BID  . docusate sodium  200 mg Oral Daily  . insulin glargine  10 Units Subcutaneous BID  . insulin regular  0-10 Units Intravenous TID WC  . levothyroxine  75 mcg Oral QAC breakfast  . metoprolol tartrate  12.5 mg Oral BID   Or  . metoprolol tartrate  12.5 mg Per Tube BID  . milrinone  50 mcg/kg Intravenous Once  . pantoprazole sodium  40 mg Per Tube Daily  . potassium chloride  10 mEq Intravenous Q1 Hr x 3  . sodium chloride  10-40 mL Intracatheter Q12H  . sodium chloride  3 mL Intravenous Q12H   Continuous Infusions: . sodium chloride 20 mL/hr at 01/28/14 0606  . sodium chloride 20 mL/hr (01/25/14 1847)  . sodium chloride 20 mL (01/26/14 1056)  . sodium chloride    . amiodarone 30 mg/hr (01/29/14 0025)  . bivalirudin (ANGIOMAX) infusion 0.5 mg/mL (Non-ACS indications) 0.04  mg/kg/hr (01/28/14 1900)  . dexmedetomidine Stopped (01/27/14 1500)  . DOPamine 5 mcg/kg/min (01/28/14 1950)  . epinephrine 1 mcg/min (01/28/14 2200)  . furosemide (LASIX) infusion 8 mg/hr (01/28/14 2355)  . insulin (NOVOLIN-R) infusion 6.9 Units/hr (01/29/14 0700)  . lactated ringers 10 mL/hr at 01/28/14 0600  . milrinone 0.3 mcg/kg/min (01/29/14 0257)  . norepinephrine (LEVOPHED) Adult infusion 4 mcg/min (01/28/14 2300)   PRN Meds:.HYDROcodone-acetaminophen, metoprolol, midazolam, morphine injection, ondansetron (ZOFRAN) IV, sodium chloride, sodium chloride   Physical Exam: General appearance: On vent, opens eyes, not following commands or moving extremities Heart: regular rate and rhythm Lungs: Few scattered rhonchi Abdomen: soft, non-tender; bowel sounds normal; no masses,  no organomegaly Extremities: Edematous Wound: Dressed and dry    Lab Results: CBC: Recent Labs  01/28/14 0339  01/28/14 1618 01/29/14 0420  WBC 19.4*  --  16.0*  HGB 9.0* 9.2* 9.1*  HCT 25.8* 27.0* 26.4*  PLT 80*  --  73*   BMET:  Recent Labs  01/28/14 0339 01/28/14 1618 01/29/14 0420  NA 138 136* 139  K 3.7 4.4 3.5*  CL 99 102 100  CO2 21  --  23  GLUCOSE 112* 88 77  BUN 14 21 19   CREATININE 1.34* 1.50* 1.33*  CALCIUM 7.7*  --  7.3*    PT/INR:  Recent Labs  01/26/14 1030  LABPROT 20.3*  INR 1.73*    Radiology: Dg Chest Port 1 View  01/28/2014   CLINICAL DATA:  73 year old female status post CABG, mitral valve replacement. Initial encounter.  EXAM: PORTABLE CHEST - 1 VIEW  COMPARISON:  01/27/2014 and earlier.  FINDINGS: Portable AP semi upright view at 0613 hrs. Stable lines and tubes, including endotracheal tube tip at the level the clavicles, right IJ approach Swan-Ganz catheter tip at the level of the right main pulmonary artery, and left chest tube. No pneumothorax. Stable cardiac size and mediastinal contours.  Stable veiling bibasilar opacity and confluent retrocardiac opacity. Decreased vascular congestion. No areas of worsening ventilation.  IMPRESSION: 1.  Stable lines and tubes. 2. No pneumothorax.  Decreased vascular congestion. 3. Small bilateral pleural effusions and lower lobe collapse or consolidation.   Electronically Signed   By: Augusto Gamble M.D.   On: 01/28/2014 07:58   Dg Chest Port 1 View  01/27/2014   CLINICAL DATA:  PICC line placement.  EXAM: PORTABLE CHEST - 1 VIEW  COMPARISON:  Chest radiograph January 27, 2014  FINDINGS: Interval placement of right peripherally inserted central catheter, distal tip projecting at cavoatrial junction. No pneumothorax.  Endotracheal tube tip projects 5.2 cm above the carina. Swan-Ganz catheter via right internal jugular venous approach with distal tip projecting in right main pulmonary artery. Aortic balloon pump in place with marker medially below the aortic arch which is mildly calcified. The left apical chest tube. Nasogastric tube past  the GE junction, with side port projecting within the proximal stomach, distal tip not imaged.  The cardiac silhouette is mildly enlarged, status post median sternotomy for cardiac valve replacement, with possible CABG. Similar interstitial prominence, bibasilar airspace opacities and small pleural effusions.  Multiple EKG lines overlie the patient and may obscure subtle underlying pathology. Calcifications in left side may be vascular.  IMPRESSION: Interval placement of right PICC, distal tip projecting at cavoatrial junction. No pneumothorax. No apparent change remaining life support lines.  Stable mild cardiomegaly, interstitial prominence likely reflects pulmonary edema with bibasilar airspace opacities most consistent with confluent edema, small pleural effusions.  These results will be called  to the ordering clinician or representative by the Radiologist Assistant, and communication documented in the PACS or zVision Dashboard.   Electronically Signed   By: Awilda Metroourtnay  Bloomer   On: 01/27/2014 12:43     Assessment/Plan: S/P Procedure(s) (LRB): CORONARY ARTERY BYPASS GRAFTING TIMES FOUR USING LEFT INTERNAL MAMMARY ARTERY TO LAD, SAPHENOUS VEIN GRAFTS TO OM1, OM2, AND PDA (N/A) MITRAL VALVE (MV) REPLACEMENT (N/A)  CV- Remains on balloon pump at 1:3, multiple drips.  Vital signs stable, sinus rhythm. Back on epi overnight after CI decreased, now stable around 1.6-1.7.    Vol overload- on Lasix gtt, UOP  75/hr overnight.  Remains quite edematous. May need to increase diuresis.  DM- sugars stable on insulin gtt.  Postoperative thrombocytopenia- plts decreased further, 80K->73.  Bilvalirudin started for MVR.  Watch plts.  ID- Leukocytosis, no fever over past 24 hours.  WBC trending down.  Continue Elita QuickFortaz D#2 for presumed RLL pneumonia. Sputum cx negative so far.  Neuro- opens eyes, but not moving extremities or following commands. No sedation over past 24 hrs. Continue to monitor.  Renal - Cr  stable at 1.3. Will follow.  Hypokalemia- replace K+.  Expected postop blood loss anemia- H/H stable.  Pulm- Continue vent support.  Nutrition- continues NPO, consider starting TNA soon.   COLLINS,GINA H 01/29/2014 7:38 AM  Patient seen and examined, agree with above Index down to 1.5 this AM- will go back to 1:2 on IABP and increase epi from 1 to 1.5 mcg/min Will place panda in anticipation of tube feeds

## 2014-01-30 ENCOUNTER — Inpatient Hospital Stay (HOSPITAL_COMMUNITY): Payer: Medicare Other

## 2014-01-30 LAB — CARBOXYHEMOGLOBIN
Carboxyhemoglobin: 0.8 % (ref 0.5–1.5)
Methemoglobin: 0.8 % (ref 0.0–1.5)
O2 Saturation: 40.1 %
Total hemoglobin: 9.7 g/dL — ABNORMAL LOW (ref 12.0–16.0)

## 2014-01-30 LAB — BLOOD GAS, ARTERIAL
Acid-base deficit: 0.1 mmol/L (ref 0.0–2.0)
Bicarbonate: 23.1 mEq/L (ref 20.0–24.0)
Drawn by: 41881
FIO2: 40 %
MECHVT: 600 mL
O2 Saturation: 99.1 %
PEEP: 5 cmH2O
Patient temperature: 98.6
RATE: 15 resp/min
TCO2: 24.1 mmol/L (ref 0–100)
pCO2 arterial: 32 mmHg — ABNORMAL LOW (ref 35.0–45.0)
pH, Arterial: 7.472 — ABNORMAL HIGH (ref 7.350–7.450)
pO2, Arterial: 140 mmHg — ABNORMAL HIGH (ref 80.0–100.0)

## 2014-01-30 LAB — CBC
HCT: 23.4 % — ABNORMAL LOW (ref 36.0–46.0)
Hemoglobin: 8.1 g/dL — ABNORMAL LOW (ref 12.0–15.0)
MCH: 29.9 pg (ref 26.0–34.0)
MCHC: 34.6 g/dL (ref 30.0–36.0)
MCV: 86.3 fL (ref 78.0–100.0)
Platelets: 81 10*3/uL — ABNORMAL LOW (ref 150–400)
RBC: 2.71 MIL/uL — ABNORMAL LOW (ref 3.87–5.11)
RDW: 15.8 % — ABNORMAL HIGH (ref 11.5–15.5)
WBC: 12.7 10*3/uL — ABNORMAL HIGH (ref 4.0–10.5)

## 2014-01-30 LAB — GLUCOSE, CAPILLARY
Glucose-Capillary: 101 mg/dL — ABNORMAL HIGH (ref 70–99)
Glucose-Capillary: 101 mg/dL — ABNORMAL HIGH (ref 70–99)
Glucose-Capillary: 102 mg/dL — ABNORMAL HIGH (ref 70–99)
Glucose-Capillary: 103 mg/dL — ABNORMAL HIGH (ref 70–99)
Glucose-Capillary: 105 mg/dL — ABNORMAL HIGH (ref 70–99)
Glucose-Capillary: 105 mg/dL — ABNORMAL HIGH (ref 70–99)
Glucose-Capillary: 107 mg/dL — ABNORMAL HIGH (ref 70–99)
Glucose-Capillary: 108 mg/dL — ABNORMAL HIGH (ref 70–99)
Glucose-Capillary: 108 mg/dL — ABNORMAL HIGH (ref 70–99)
Glucose-Capillary: 109 mg/dL — ABNORMAL HIGH (ref 70–99)
Glucose-Capillary: 109 mg/dL — ABNORMAL HIGH (ref 70–99)
Glucose-Capillary: 109 mg/dL — ABNORMAL HIGH (ref 70–99)
Glucose-Capillary: 110 mg/dL — ABNORMAL HIGH (ref 70–99)
Glucose-Capillary: 110 mg/dL — ABNORMAL HIGH (ref 70–99)
Glucose-Capillary: 112 mg/dL — ABNORMAL HIGH (ref 70–99)
Glucose-Capillary: 113 mg/dL — ABNORMAL HIGH (ref 70–99)
Glucose-Capillary: 115 mg/dL — ABNORMAL HIGH (ref 70–99)
Glucose-Capillary: 115 mg/dL — ABNORMAL HIGH (ref 70–99)
Glucose-Capillary: 116 mg/dL — ABNORMAL HIGH (ref 70–99)
Glucose-Capillary: 117 mg/dL — ABNORMAL HIGH (ref 70–99)
Glucose-Capillary: 117 mg/dL — ABNORMAL HIGH (ref 70–99)
Glucose-Capillary: 118 mg/dL — ABNORMAL HIGH (ref 70–99)
Glucose-Capillary: 120 mg/dL — ABNORMAL HIGH (ref 70–99)
Glucose-Capillary: 122 mg/dL — ABNORMAL HIGH (ref 70–99)
Glucose-Capillary: 123 mg/dL — ABNORMAL HIGH (ref 70–99)
Glucose-Capillary: 124 mg/dL — ABNORMAL HIGH (ref 70–99)
Glucose-Capillary: 128 mg/dL — ABNORMAL HIGH (ref 70–99)
Glucose-Capillary: 95 mg/dL (ref 70–99)
Glucose-Capillary: 97 mg/dL (ref 70–99)

## 2014-01-30 LAB — COMPREHENSIVE METABOLIC PANEL
ALT: 142 U/L — ABNORMAL HIGH (ref 0–35)
AST: 115 U/L — ABNORMAL HIGH (ref 0–37)
Albumin: 2.6 g/dL — ABNORMAL LOW (ref 3.5–5.2)
Alkaline Phosphatase: 99 U/L (ref 39–117)
Anion gap: 17 — ABNORMAL HIGH (ref 5–15)
BUN: 25 mg/dL — ABNORMAL HIGH (ref 6–23)
CO2: 22 mEq/L (ref 19–32)
Calcium: 7.4 mg/dL — ABNORMAL LOW (ref 8.4–10.5)
Chloride: 97 mEq/L (ref 96–112)
Creatinine, Ser: 1.47 mg/dL — ABNORMAL HIGH (ref 0.50–1.10)
GFR calc Af Amer: 39 mL/min — ABNORMAL LOW (ref >90)
GFR calc non Af Amer: 34 mL/min — ABNORMAL LOW (ref >90)
Glucose, Bld: 105 mg/dL — ABNORMAL HIGH (ref 70–99)
Potassium: 3.3 mEq/L — ABNORMAL LOW (ref 3.7–5.3)
Sodium: 136 mEq/L — ABNORMAL LOW (ref 137–147)
Total Bilirubin: 0.9 mg/dL (ref 0.3–1.2)
Total Protein: 5.1 g/dL — ABNORMAL LOW (ref 6.0–8.3)

## 2014-01-30 LAB — APTT
aPTT: 62 seconds — ABNORMAL HIGH (ref 24–37)
aPTT: 64 seconds — ABNORMAL HIGH (ref 24–37)

## 2014-01-30 LAB — HEPARIN INDUCED THROMBOCYTOPENIA PNL
Heparin Induced Plt Ab: NEGATIVE
Patient O.D.: 0.19
UFH High Dose UFH H: 0 % Release
UFH Low Dose 0.1 IU/mL: 0 % Release
UFH Low Dose 0.5 IU/mL: 0 % Release
UFH SRA Result: NEGATIVE

## 2014-01-30 MED ORDER — POTASSIUM CHLORIDE 10 MEQ/50ML IV SOLN
10.0000 meq | INTRAVENOUS | Status: AC
  Administered 2014-01-30 (×3): 10 meq via INTRAVENOUS

## 2014-01-30 MED ORDER — SODIUM CHLORIDE 0.9 % IV SOLN
0.0350 mg/kg/h | INTRAVENOUS | Status: DC
  Administered 2014-01-30 – 2014-02-02 (×3): 0.04 mg/kg/h via INTRAVENOUS
  Administered 2014-02-03 – 2014-02-06 (×4): 0.048 mg/kg/h via INTRAVENOUS
  Filled 2014-01-30 (×8): qty 250

## 2014-01-30 MED ORDER — POTASSIUM CHLORIDE 10 MEQ/50ML IV SOLN
10.0000 meq | INTRAVENOUS | Status: AC
  Administered 2014-01-30 (×3): 10 meq via INTRAVENOUS
  Filled 2014-01-30 (×2): qty 50

## 2014-01-30 MED ORDER — POTASSIUM CHLORIDE 10 MEQ/50ML IV SOLN
10.0000 meq | INTRAVENOUS | Status: AC
  Administered 2014-01-30 (×2): 10 meq via INTRAVENOUS
  Filled 2014-01-30 (×2): qty 50

## 2014-01-30 NOTE — Progress Notes (Signed)
      [address]       [place] [address]             [phone]     BRIEF PROCEDURE NOTE    Right femoral IABP removed intact  Manual pressure used to maintain hemostasis   +DP/PT doppler signals post procedure  Patient tolerated well  Femoral weight placed for 4 hours   Wilda Wetherell E, PA-C

## 2014-01-30 NOTE — Progress Notes (Signed)
Patient ID: [name], female   DOB: 21-Apr-1941, 73 y.o.   MRN: [id number]  SICU Evening Rounds  Hemodynamically stable on dop 5, epi 1.5, and milrinone 0.3  CI 1.6   IABP removed Sinus rhythm on amio  Back on bivalirudin.  Urine output good on lasix drip 8.   More alert on vent.  Continue present support tonight.

## 2014-01-30 NOTE — Progress Notes (Signed)
5 Days Post-Op Procedure(s) (LRB): CORONARY ARTERY BYPASS GRAFTING TIMES FOUR USING LEFT INTERNAL MAMMARY ARTERY TO LAD, SAPHENOUS VEIN GRAFTS TO OM1, OM2, AND PDA (N/A) MITRAL VALVE (MV) REPLACEMENT (N/A) Subjective: Intubated Has followed commands for RN Was given morphine at 0400  Objective: Vital signs in last 24 hours: Temp:  [97.5 F (36.4 C)-98.4 F (36.9 C)] 97.5 F (36.4 C) (07/17 0700) Pulse Rate:  [39-86] 80 (07/17 0726) Cardiac Rhythm:  [-] Junctional rhythm (07/17 0400) Resp:  [11-35] 15 (07/17 0726) BP: (88-144)/(46-68) 114/60 mmHg (07/17 0726) SpO2:  [96 %-100 %] 100 % (07/17 0726) Arterial Line BP: (74-134)/(31-76) 117/57 mmHg (07/17 0700) FiO2 (%):  [40 %] 40 % (07/17 0726) Weight:  [248 lb 0.3 oz (112.5 kg)] 248 lb 0.3 oz (112.5 kg) (07/17 0500)  Hemodynamic parameters for last 24 hours: PAP: (32-65)/(16-31) 41/20 mmHg CVP:  [11 mmHg-39 mmHg] 12 mmHg CO:  [3.2 L/min-4.3 L/min] 4.3 L/min CI:  [1.6 L/min/m2-2.2 L/min/m2] 2.2 L/min/m2  Intake/Output from previous day: 07/16 0701 - 07/17 0700 In: 3498.3 [I.V.:2728.3; NG/GT:410; IV Piggyback:360] Out: 2680 [Urine:1620; Emesis/NG output:800; Chest Tube:260] Intake/Output this shift:    General appearance: intubated, sedated Heart: regular rate and rhythm Lungs: clear to auscultation bilaterally Abdomen: normal findings: soft, non-tender Extremities: well perfused, edematous  Lab Results:  Recent Labs  01/29/14 0420 01/30/14 0400  WBC 16.0* 12.7*  HGB 9.1* 8.1*  HCT 26.4* 23.4*  PLT 73* 81*   BMET:  Recent Labs  01/29/14 0420 01/30/14 0400  NA 139 136*  K 3.5* 3.3*  CL 100 97  CO2 23 22  GLUCOSE 77 105*  BUN 19 25*  CREATININE 1.33* 1.47*  CALCIUM 7.3* 7.4*    PT/INR: No results found for this basename: LABPROT, INR,  in the last 72 hours ABG    Component Value Date/Time   PHART 7.472* 01/30/2014 0339   HCO3 23.1 01/30/2014 0339   TCO2 24.1 01/30/2014 0339   ACIDBASEDEF 0.1 01/30/2014  0339   O2SAT 40.1 01/30/2014 0339   O2SAT 99.1 01/30/2014 0339   CBG (last 3)   Recent Labs  01/29/14 2159 01/29/14 2304 01/29/14 2356  GLUCAP 112* 113* 109*    Assessment/Plan: S/P Procedure(s) (LRB): CORONARY ARTERY BYPASS GRAFTING TIMES FOUR USING LEFT INTERNAL MAMMARY ARTERY TO LAD, SAPHENOUS VEIN GRAFTS TO OM1, OM2, AND PDA (N/A) MITRAL VALVE (MV) REPLACEMENT (N/A) - CV- hemodynamics improved, CI > 2  IABP to 1:3- will remove today if she tolerates  Continue inotropes  Hold bivalrudin prior to IABP removal  RESP_ VDRF- stable on ventilator, not ready to wean yet  RENAL- creatinine up slightly this AM  Hypokalemia- supplement  Continue diuresis  ENDO- CBG well controlled  Thrombocytopenia- platelet count up a little this AM- will give platelets prior to anticipated IABP removal  Anemia- hgb down slightly this AM, follow  NEURO- was noted to be more alert and following some commands this AM. Overall c/w toxic-metabolic encephalopathy   LOS: 5 days    Fathima Bartl C 01/30/2014

## 2014-01-30 NOTE — Progress Notes (Signed)
ANTICOAGULATION CONSULT NOTE - Initial Consult  Pharmacy Consult for angiomax Indication: MVR and thrombocytopenia  No Known Allergies  Patient Measurements: Height: 5\' 9"  (175.3 cm) Weight: 248 lb 0.3 oz (112.5 kg) IBW/kg (Calculated) : 66.2 Heparin Dosing Weight:   Vital Signs: Temp: 97.2 F (36.2 C) (07/17 1200) Temp src: Core (Comment) (07/17 1000) BP: 120/57 mmHg (07/17 1215) Pulse Rate: 79 (07/17 1215)  Labs:  Recent Labs  01/28/14 0339 01/28/14 1618 01/29/14 0420 01/30/14 0400  HGB 9.0* 9.2* 9.1* 8.1*  HCT 25.8* 27.0* 26.4* 23.4*  PLT 80*  --  73* 81*  APTT 63*  --  61* 62*  CREATININE 1.34* 1.50* 1.33* 1.47*    Estimated Creatinine Clearance: 44.9 ml/min (by C-G formula based on Cr of 1.47).   Medical History: Past Medical History  Diagnosis Date  . History of MI (myocardial infarction)     [facility] (cannot get cath report on Care Everywhere(  . Essential hypertension   . TIA (transient ischemic attack)   . Thyroid disease   . Anxiety   . Diabetes mellitus type 2 with peripheral artery disease     On insulin  . PAD (peripheral artery disease)   . Hyperlipidemia associated with type 2 diabetes mellitus     Medications:  Scheduled:  . acetaminophen  1,000 mg Oral 4 times per day   Or  . acetaminophen (TYLENOL) oral liquid 160 mg/5 mL  1,000 mg Per Tube 4 times per day  . antiseptic oral rinse  15 mL Mouth Rinse QID  . aspirin EC  325 mg Oral Daily   Or  . aspirin  324 mg Per Tube Daily  . atorvastatin  80 mg Oral q1800  . bisacodyl  10 mg Oral Daily   Or  . bisacodyl  10 mg Rectal Daily  . cefTAZidime (FORTAZ)  IV  1 g Intravenous 3 times per day  . chlorhexidine  15 mL Mouth Rinse BID  . docusate  200 mg Oral Daily  . insulin glargine  10 Units Subcutaneous BID  . insulin regular  0-10 Units Intravenous TID WC  . levothyroxine  75 mcg Oral QAC breakfast  . metoprolol tartrate  12.5 mg Oral BID   Or  . metoprolol tartrate   12.5 mg Per Tube BID  . milrinone  50 mcg/kg Intravenous Once  . pantoprazole sodium  40 mg Per Tube Daily  . sodium chloride  10-40 mL Intracatheter Q12H  . sodium chloride  3 mL Intravenous Q12H   Infusions:  . sodium chloride 10 mL/hr at 01/29/14 2000  . sodium chloride 20 mL/hr (01/25/14 1847)  . sodium chloride 20 mL/hr at 01/29/14 1322  . sodium chloride    . amiodarone 30 mg/hr (01/30/14 1200)  . dexmedetomidine Stopped (01/27/14 1500)  . DOPamine 5 mcg/kg/min (01/30/14 1200)  . epinephrine 1.5 mcg/min (01/30/14 1200)  . furosemide (LASIX) infusion 8 mg/hr (01/30/14 1200)  . insulin (NOVOLIN-R) infusion 2.7 Units/hr (01/30/14 1200)  . lactated ringers 10 mL/hr at 01/29/14 1322  . milrinone 0.3 mcg/kg/min (01/30/14 1200)  . norepinephrine (LEVOPHED) Adult infusion Stopped (01/30/14 1000)    Assessment: 73 yo who is s/p CABG and MVR. She has been on angiomax for anticoagulation because she had an IABP pump. The pump was removed today. She also has thrombocytopenia. Angiomax has been reordered for anticoagulation. She has been therapeutic in the last couple of days.   Goal of Therapy:  aPTT 50-90 seconds Monitor platelets  by anticoagulation protocol: Yes   Plan:   Restart angiomax at 0.04mg /kg/hr Recheck level in 4 hr Daily level

## 2014-01-31 ENCOUNTER — Inpatient Hospital Stay (HOSPITAL_COMMUNITY): Payer: Medicare Other

## 2014-01-31 LAB — CBC
HCT: 24.3 % — ABNORMAL LOW (ref 36.0–46.0)
Hemoglobin: 8.4 g/dL — ABNORMAL LOW (ref 12.0–15.0)
MCH: 30.7 pg (ref 26.0–34.0)
MCHC: 34.6 g/dL (ref 30.0–36.0)
MCV: 88.7 fL (ref 78.0–100.0)
Platelets: 137 10*3/uL — ABNORMAL LOW (ref 150–400)
RBC: 2.74 MIL/uL — ABNORMAL LOW (ref 3.87–5.11)
RDW: 16 % — ABNORMAL HIGH (ref 11.5–15.5)
WBC: 10.7 10*3/uL — ABNORMAL HIGH (ref 4.0–10.5)

## 2014-01-31 LAB — POCT I-STAT, CHEM 8
BUN: 30 mg/dL — ABNORMAL HIGH (ref 6–23)
BUN: 25 mg/dL — ABNORMAL HIGH (ref 6–23)
Calcium, Ion: 0.99 mmol/L — ABNORMAL LOW (ref 1.13–1.30)
Calcium, Ion: 1.04 mmol/L — ABNORMAL LOW (ref 1.13–1.30)
Chloride: 102 mEq/L (ref 96–112)
Chloride: 98 mEq/L (ref 96–112)
Creatinine, Ser: 1.5 mg/dL — ABNORMAL HIGH (ref 0.50–1.10)
Creatinine, Ser: 1.5 mg/dL — ABNORMAL HIGH (ref 0.50–1.10)
Glucose, Bld: 103 mg/dL — ABNORMAL HIGH (ref 70–99)
Glucose, Bld: 178 mg/dL — ABNORMAL HIGH (ref 70–99)
HCT: 26 % — ABNORMAL LOW (ref 36.0–46.0)
HCT: 27 % — ABNORMAL LOW (ref 36.0–46.0)
Hemoglobin: 8.8 g/dL — ABNORMAL LOW (ref 12.0–15.0)
Hemoglobin: 9.2 g/dL — ABNORMAL LOW (ref 12.0–15.0)
Potassium: 3.3 mEq/L — ABNORMAL LOW (ref 3.7–5.3)
Potassium: 3.7 mEq/L (ref 3.7–5.3)
Sodium: 136 mEq/L — ABNORMAL LOW (ref 137–147)
Sodium: 134 mEq/L — ABNORMAL LOW (ref 137–147)
TCO2: 24 mmol/L (ref 0–100)
TCO2: 23 mmol/L (ref 0–100)

## 2014-01-31 LAB — GLUCOSE, CAPILLARY
Glucose-Capillary: 107 mg/dL — ABNORMAL HIGH (ref 70–99)
Glucose-Capillary: 109 mg/dL — ABNORMAL HIGH (ref 70–99)
Glucose-Capillary: 147 mg/dL — ABNORMAL HIGH (ref 70–99)
Glucose-Capillary: 148 mg/dL — ABNORMAL HIGH (ref 70–99)
Glucose-Capillary: 151 mg/dL — ABNORMAL HIGH (ref 70–99)
Glucose-Capillary: 157 mg/dL — ABNORMAL HIGH (ref 70–99)
Glucose-Capillary: 90 mg/dL (ref 70–99)

## 2014-01-31 LAB — CARBOXYHEMOGLOBIN
Carboxyhemoglobin: 1.2 % (ref 0.5–1.5)
Methemoglobin: 0.9 % (ref 0.0–1.5)
O2 Saturation: 50.2 %
Total hemoglobin: 8.1 g/dL — ABNORMAL LOW (ref 12.0–16.0)

## 2014-01-31 LAB — PREPARE PLATELET PHERESIS: UNIT DIVISION: 0

## 2014-01-31 LAB — POCT I-STAT 3, ART BLOOD GAS (G3+)
Acid-Base Excess: 4 mmol/L — ABNORMAL HIGH (ref 0.0–2.0)
Bicarbonate: 26.9 mEq/L — ABNORMAL HIGH (ref 20.0–24.0)
O2 Saturation: 96 %
Patient temperature: 36.3
TCO2: 28 mmol/L (ref 0–100)
pCO2 arterial: 32.8 mmHg — ABNORMAL LOW (ref 35.0–45.0)
pH, Arterial: 7.518 — ABNORMAL HIGH (ref 7.350–7.450)
pO2, Arterial: 72 mmHg — ABNORMAL LOW (ref 80.0–100.0)

## 2014-01-31 LAB — COMPREHENSIVE METABOLIC PANEL
ALT: 98 U/L — ABNORMAL HIGH (ref 0–35)
AST: 64 U/L — ABNORMAL HIGH (ref 0–37)
Albumin: 2.4 g/dL — ABNORMAL LOW (ref 3.5–5.2)
Alkaline Phosphatase: 147 U/L — ABNORMAL HIGH (ref 39–117)
Anion gap: 15 (ref 5–15)
BUN: 25 mg/dL — ABNORMAL HIGH (ref 6–23)
CO2: 23 mEq/L (ref 19–32)
Calcium: 7.4 mg/dL — ABNORMAL LOW (ref 8.4–10.5)
Chloride: 96 mEq/L (ref 96–112)
Creatinine, Ser: 1.31 mg/dL — ABNORMAL HIGH (ref 0.50–1.10)
GFR calc Af Amer: 45 mL/min — ABNORMAL LOW (ref >90)
GFR calc non Af Amer: 39 mL/min — ABNORMAL LOW (ref >90)
Glucose, Bld: 119 mg/dL — ABNORMAL HIGH (ref 70–99)
Potassium: 3.3 mEq/L — ABNORMAL LOW (ref 3.7–5.3)
Sodium: 134 mEq/L — ABNORMAL LOW (ref 137–147)
Total Bilirubin: 1.1 mg/dL (ref 0.3–1.2)
Total Protein: 5.3 g/dL — ABNORMAL LOW (ref 6.0–8.3)

## 2014-01-31 LAB — APTT: aPTT: 64 seconds — ABNORMAL HIGH (ref 24–37)

## 2014-01-31 MED ORDER — PRO-STAT SUGAR FREE PO LIQD
30.0000 mL | Freq: Two times a day (BID) | ORAL | Status: DC
  Administered 2014-01-31 – 2014-02-02 (×6): 30 mL via ORAL
  Administered 2014-02-03: 11:00:00 via ORAL
  Administered 2014-02-03 – 2014-02-05 (×4): 30 mL via ORAL
  Filled 2014-01-31 (×13): qty 30

## 2014-01-31 MED ORDER — POTASSIUM CHLORIDE 10 MEQ/50ML IV SOLN
10.0000 meq | INTRAVENOUS | Status: AC
  Administered 2014-01-31 (×3): 10 meq via INTRAVENOUS

## 2014-01-31 MED ORDER — POTASSIUM CHLORIDE 10 MEQ/50ML IV SOLN
10.0000 meq | INTRAVENOUS | Status: AC
  Administered 2014-01-31 (×2): 10 meq via INTRAVENOUS

## 2014-01-31 MED ORDER — VITAL HIGH PROTEIN PO LIQD
1000.0000 mL | ORAL | Status: DC
  Administered 2014-01-31 – 2014-02-03 (×2): 1000 mL
  Filled 2014-01-31 (×5): qty 1000

## 2014-01-31 MED ORDER — INSULIN ASPART 100 UNIT/ML ~~LOC~~ SOLN
0.0000 [IU] | SUBCUTANEOUS | Status: DC
  Administered 2014-01-31 (×4): 2 [IU] via SUBCUTANEOUS
  Administered 2014-02-01: 8 [IU] via SUBCUTANEOUS
  Administered 2014-02-01: 09:00:00 via SUBCUTANEOUS
  Administered 2014-02-01: 4 [IU] via SUBCUTANEOUS
  Administered 2014-02-01: 2 [IU] via SUBCUTANEOUS
  Administered 2014-02-01: 8 [IU] via SUBCUTANEOUS
  Administered 2014-02-01 – 2014-02-02 (×3): 4 [IU] via SUBCUTANEOUS
  Administered 2014-02-02: 2 [IU] via SUBCUTANEOUS
  Administered 2014-02-02: 4 [IU] via SUBCUTANEOUS
  Administered 2014-02-02: 2 [IU] via SUBCUTANEOUS
  Administered 2014-02-02: 4 [IU] via SUBCUTANEOUS
  Administered 2014-02-03: 2 [IU] via SUBCUTANEOUS
  Administered 2014-02-03 (×2): 4 [IU] via SUBCUTANEOUS
  Administered 2014-02-03: 2 [IU] via SUBCUTANEOUS
  Administered 2014-02-03: 4 [IU] via SUBCUTANEOUS
  Administered 2014-02-03: 2 [IU] via SUBCUTANEOUS
  Administered 2014-02-03: 4 [IU] via SUBCUTANEOUS
  Administered 2014-02-04: 2 [IU] via SUBCUTANEOUS
  Administered 2014-02-04 (×2): 4 [IU] via SUBCUTANEOUS
  Administered 2014-02-04: 2 [IU] via SUBCUTANEOUS
  Administered 2014-02-05: 4 [IU] via SUBCUTANEOUS
  Administered 2014-02-05: 2 [IU] via SUBCUTANEOUS
  Administered 2014-02-05: 4 [IU] via SUBCUTANEOUS
  Administered 2014-02-05 (×2): 2 [IU] via SUBCUTANEOUS
  Administered 2014-02-05: 4 [IU] via SUBCUTANEOUS
  Administered 2014-02-06 (×3): 2 [IU] via SUBCUTANEOUS
  Administered 2014-02-06: 4 [IU] via SUBCUTANEOUS
  Administered 2014-02-06: 2 [IU] via SUBCUTANEOUS
  Administered 2014-02-07: 8 [IU] via SUBCUTANEOUS
  Administered 2014-02-07 (×3): 2 [IU] via SUBCUTANEOUS
  Administered 2014-02-07 – 2014-02-09 (×2): 4 [IU] via SUBCUTANEOUS
  Administered 2014-02-09 (×3): 2 [IU] via SUBCUTANEOUS
  Administered 2014-02-10: 8 [IU] via SUBCUTANEOUS
  Administered 2014-02-10: 4 [IU] via SUBCUTANEOUS
  Administered 2014-02-10: 12 [IU] via SUBCUTANEOUS
  Administered 2014-02-10: 12:00:00 via SUBCUTANEOUS
  Administered 2014-02-10 – 2014-02-11 (×3): 4 [IU] via SUBCUTANEOUS
  Administered 2014-02-11: 2 [IU] via SUBCUTANEOUS
  Administered 2014-02-11 (×3): 4 [IU] via SUBCUTANEOUS
  Administered 2014-02-11: 8 [IU] via SUBCUTANEOUS
  Administered 2014-02-12: 2 [IU] via SUBCUTANEOUS
  Administered 2014-02-12 (×3): 4 [IU] via SUBCUTANEOUS
  Administered 2014-02-12: 2 [IU] via SUBCUTANEOUS
  Administered 2014-02-12: 4 [IU] via SUBCUTANEOUS
  Administered 2014-02-13 (×2): 2 [IU] via SUBCUTANEOUS
  Administered 2014-02-13 (×2): 4 [IU] via SUBCUTANEOUS
  Administered 2014-02-13 – 2014-02-14 (×3): 2 [IU] via SUBCUTANEOUS
  Administered 2014-02-14: 4 [IU] via SUBCUTANEOUS
  Administered 2014-02-14: 2 [IU] via SUBCUTANEOUS
  Administered 2014-02-14: via SUBCUTANEOUS
  Administered 2014-02-15: 2 [IU] via SUBCUTANEOUS
  Administered 2014-02-15: 4 [IU] via SUBCUTANEOUS
  Administered 2014-02-16 (×4): 2 [IU] via SUBCUTANEOUS
  Administered 2014-02-17: 4 [IU] via SUBCUTANEOUS
  Administered 2014-02-17: 8 [IU] via SUBCUTANEOUS
  Administered 2014-02-17: 2 [IU] via SUBCUTANEOUS
  Administered 2014-02-17 – 2014-02-18 (×3): 4 [IU] via SUBCUTANEOUS
  Administered 2014-02-18: 8 [IU] via SUBCUTANEOUS
  Administered 2014-02-18: 2 [IU] via SUBCUTANEOUS
  Administered 2014-02-18 – 2014-02-20 (×11): 4 [IU] via SUBCUTANEOUS
  Administered 2014-02-20: 2 [IU] via SUBCUTANEOUS
  Administered 2014-02-20: 8 [IU] via SUBCUTANEOUS
  Administered 2014-02-20: 4 [IU] via SUBCUTANEOUS
  Administered 2014-02-21: 2 [IU] via SUBCUTANEOUS
  Administered 2014-02-21: 12 [IU] via SUBCUTANEOUS
  Administered 2014-02-21: 8 [IU] via SUBCUTANEOUS
  Administered 2014-02-21 (×2): 4 [IU] via SUBCUTANEOUS
  Administered 2014-02-22: 2 [IU] via SUBCUTANEOUS
  Administered 2014-02-22 (×2): 4 [IU] via SUBCUTANEOUS
  Administered 2014-02-22 (×3): 8 [IU] via SUBCUTANEOUS
  Administered 2014-02-23: 4 [IU] via SUBCUTANEOUS
  Administered 2014-02-23: 8 [IU] via SUBCUTANEOUS
  Administered 2014-02-23 (×2): 4 [IU] via SUBCUTANEOUS
  Administered 2014-02-23: 8 [IU] via SUBCUTANEOUS
  Administered 2014-02-23: 4 [IU] via SUBCUTANEOUS
  Administered 2014-02-24: 2 [IU] via SUBCUTANEOUS
  Administered 2014-02-24: 4 [IU] via SUBCUTANEOUS
  Administered 2014-02-24: 8 [IU] via SUBCUTANEOUS
  Administered 2014-02-24: 4 [IU] via SUBCUTANEOUS
  Administered 2014-02-24: 8 [IU] via SUBCUTANEOUS
  Administered 2014-02-25 (×3): 4 [IU] via SUBCUTANEOUS
  Administered 2014-02-25: 8 [IU] via SUBCUTANEOUS
  Administered 2014-02-25: 2 [IU] via SUBCUTANEOUS
  Administered 2014-02-25: 8 [IU] via SUBCUTANEOUS
  Administered 2014-02-26 (×3): 2 [IU] via SUBCUTANEOUS
  Administered 2014-02-26: 4 [IU] via SUBCUTANEOUS
  Administered 2014-02-27: 8 [IU] via SUBCUTANEOUS
  Administered 2014-02-27: 12 [IU] via SUBCUTANEOUS
  Administered 2014-02-27: 8 [IU] via SUBCUTANEOUS
  Administered 2014-02-27 – 2014-02-28 (×3): 4 [IU] via SUBCUTANEOUS
  Administered 2014-02-28 (×2): 12 [IU] via SUBCUTANEOUS
  Administered 2014-02-28: 2 [IU] via SUBCUTANEOUS
  Administered 2014-02-28 (×2): 8 [IU] via SUBCUTANEOUS
  Administered 2014-03-01: 12 [IU] via SUBCUTANEOUS
  Administered 2014-03-01: 4 [IU] via SUBCUTANEOUS
  Administered 2014-03-01 (×3): 8 [IU] via SUBCUTANEOUS
  Administered 2014-03-01 – 2014-03-02 (×2): 4 [IU] via SUBCUTANEOUS
  Administered 2014-03-02: 8 [IU] via SUBCUTANEOUS
  Administered 2014-03-02: 2 [IU] via SUBCUTANEOUS
  Administered 2014-03-02: 8 [IU] via SUBCUTANEOUS
  Administered 2014-03-02 – 2014-03-03 (×2): 4 [IU] via SUBCUTANEOUS
  Administered 2014-03-03 (×3): 2 [IU] via SUBCUTANEOUS
  Administered 2014-03-03 – 2014-03-04 (×2): 4 [IU] via SUBCUTANEOUS
  Administered 2014-03-04: 2 [IU] via SUBCUTANEOUS
  Administered 2014-03-04 (×4): 4 [IU] via SUBCUTANEOUS
  Administered 2014-03-05 (×2): 2 [IU] via SUBCUTANEOUS
  Administered 2014-03-05 – 2014-03-06 (×6): 4 [IU] via SUBCUTANEOUS

## 2014-01-31 NOTE — Progress Notes (Signed)
ANTICOAGULATION CONSULT NOTE - Follow Up Consult  Pharmacy Consult for Angiomax Indication: MVR w/ thrombocytopenia  Labs:  Recent Labs  01/29/14 0420 01/30/14 0400 01/30/14 2035 01/30/14 2200 01/31/14 0427  HGB 9.1* 8.1* 8.8*  --  8.4*  HCT 26.4* 23.4* 26.0*  --  24.3*  PLT 73* 81*  --   --  137*  APTT 61* 62*  --  64* 64*  CREATININE 1.33* 1.47* 1.50*  --  1.31*    Assessment/Plan:  73yo female remains therapeutic on Angiomax after resumed s/p balloon removal. H/H low but stable. Plt trended up. Will continue gtt at current rate and continue to monitor AM aPTT.   Vinnie Level, PharmD.  Clinical Pharmacist Pager [phone]

## 2014-01-31 NOTE — Progress Notes (Signed)
ANTICOAGULATION CONSULT NOTE - Follow Up Consult  Pharmacy Consult for Angiomax Indication: MVR w/ thrombocytopenia  Labs:  Recent Labs  01/28/14 0339 01/28/14 1618 01/29/14 0420 01/30/14 0400 01/30/14 2200  HGB 9.0* 9.2* 9.1* 8.1*  --   HCT 25.8* 27.0* 26.4* 23.4*  --   PLT 80*  --  73* 81*  --   APTT 63*  --  61* 62* 64*  CREATININE 1.34* 1.50* 1.33* 1.47*  --     Assessment/Plan:  73yo female therapeutic on Angiomax after resumed s/p balloon removal.  Will continue gtt at current rate and confirm stable with am labs.  Vernard Gambles, PharmD, BCPS  01/31/2014,12:55 AM

## 2014-01-31 NOTE — Progress Notes (Signed)
Patient ID: [name], female   DOB: 1941-04-14, 73 y.o.   MRN: [id number]  SICU Evening rounds:  Hemodynamically stable on current inotropes  Urine output 150/hr on lasix 8/hr.  Had a BM today Tube feeds are going.  BMET    Component Value Date/Time   NA 134* 01/31/2014 1735   K 3.7 01/31/2014 1735   CL 98 01/31/2014 1735   CO2 23 01/31/2014 0427   GLUCOSE 178* 01/31/2014 1735   BUN 25* 01/31/2014 1735   CREATININE 1.50* 01/31/2014 1735   CALCIUM 7.4* 01/31/2014 0427   GFRNONAA 39* 01/31/2014 0427   GFRAA 45* 01/31/2014 0427     Continue present course tonight.

## 2014-01-31 NOTE — Progress Notes (Signed)
6 Days Post-Op Procedure(s) (LRB): CORONARY ARTERY BYPASS GRAFTING TIMES FOUR USING LEFT INTERNAL MAMMARY ARTERY TO LAD, SAPHENOUS VEIN GRAFTS TO OM1, OM2, AND PDA (N/A) MITRAL VALVE (MV) REPLACEMENT (N/A) Subjective: Intubated but responds  Objective: Vital signs in last 24 hours: Temp:  [97 F (36.1 C)-97.4 F (36.3 C)] 97 F (36.1 C) (07/18 1000) Pulse Rate:  [65-81] 76 (07/18 1000) Cardiac Rhythm:  [-] Normal sinus rhythm (07/18 1000) Resp:  [7-27] 17 (07/18 1000) BP: (109-139)/(50-72) 111/58 mmHg (07/18 1000) SpO2:  [97 %-100 %] 98 % (07/18 1000) Arterial Line BP: (101-154)/(53-70) 127/58 mmHg (07/18 1000) FiO2 (%):  [30 %-40 %] 30 % (07/18 1000) Weight:  [115.2 kg (253 lb 15.5 oz)] 115.2 kg (253 lb 15.5 oz) (07/18 0600)  Hemodynamic parameters for last 24 hours: PAP: (29-53)/(4-36) 44/24 mmHg CVP:  [5 mmHg-18 mmHg] 6 mmHg CO:  [3.1 L/min-3.9 L/min] 3.4 L/min CI:  [1.6 L/min/m2-2 L/min/m2] 1.7 L/min/m2  Intake/Output from previous day: 07/17 0701 - 07/18 0700 In: 3172.6 [I.V.:2445.6; Blood:227; NG/GT:150; IV Piggyback:350] Out: 2935 [Urine:2525; Emesis/NG output:350; Chest Tube:60] Intake/Output this shift: Total I/O In: 442.4 [I.V.:282.4; NG/GT:60; IV Piggyback:100] Out: 425 [Urine:425]  Neurologic: opens eyes and responds, follow some commands. Moving arms around but will not move legs for me. Heart: regular rate and rhythm, S1, S2 normal, no murmur, click, rub or gallop Lungs: clear to auscultation bilaterally Abdomen: soft, non-tender; bowel sounds normal; no masses,  no organomegaly Extremities: edema moderate Wound: incisions ok  Lab Results:  Recent Labs  01/30/14 0400 01/30/14 2035 01/31/14 0427  WBC 12.7*  --  10.7*  HGB 8.1* 8.8* 8.4*  HCT 23.4* 26.0* 24.3*  PLT 81*  --  137*   BMET:  Recent Labs  01/30/14 0400 01/30/14 2035 01/31/14 0427  NA 136* 136* 134*  K 3.3* 3.3* 3.3*  CL 97 102 96  CO2 22  --  23  GLUCOSE 105* 103* 119*  BUN 25*  30* 25*  CREATININE 1.47* 1.50* 1.31*  CALCIUM 7.4*  --  7.4*    PT/INR: No results found for this basename: LABPROT, INR,  in the last 72 hours ABG    Component Value Date/Time   PHART 7.518* 01/31/2014 0437   HCO3 26.9* 01/31/2014 0437   TCO2 28 01/31/2014 0437   ACIDBASEDEF 0.1 01/30/2014 0339   O2SAT 50.2 01/31/2014 0500   CBG (last 3)   Recent Labs  01/31/14 0009 01/31/14 0406 01/31/14 0751  GLUCAP 107* 109* 148*    Assessment/Plan: S/P Procedure(s) (LRB): CORONARY ARTERY BYPASS GRAFTING TIMES FOUR USING LEFT INTERNAL MAMMARY ARTERY TO LAD, SAPHENOUS VEIN GRAFTS TO OM1, OM2, AND PDA (N/A) MITRAL VALVE (MV) REPLACEMENT (N/A)  She is hemodynamically stable on current inotropes with good bp, CI 1.7. Co-ox is only 50%. She dropped CI overnight when epi weaned. I would continue her current inotropes for now. Will need to reassess LV by echo Monday.   She is diuresing on lasix drip and creatinine is falling.   VDRF postop. CXR looks ok. I don't think she is ready to wean yet with inotrope requirement, marginal CI, altered mental status. She may be ready in a couple days though. Ideally would like to get off more volume and get epi off.  Start TF for nutrition. She had a BM yesterday and has reasonable bowel sounds.  Continue amio for A-fib. She is maintaining sinus.  Continue bivalirudin for now. Her HIT panel was negative. Platelets increasing. 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 Eventually  will need coumadin.     LOS: 6 days    BARTLE,BRYAN K 01/31/2014

## 2014-02-01 ENCOUNTER — Inpatient Hospital Stay (HOSPITAL_COMMUNITY): Payer: Medicare Other

## 2014-02-01 LAB — APTT: aPTT: 62 seconds — ABNORMAL HIGH (ref 24–37)

## 2014-02-01 LAB — CARBOXYHEMOGLOBIN
Carboxyhemoglobin: 1.4 % (ref 0.5–1.5)
Methemoglobin: 1 % (ref 0.0–1.5)
O2 Saturation: 49.8 %
Total hemoglobin: 8.2 g/dL — ABNORMAL LOW (ref 12.0–16.0)

## 2014-02-01 LAB — CBC
HCT: 24.2 % — ABNORMAL LOW (ref 36.0–46.0)
Hemoglobin: 8.5 g/dL — ABNORMAL LOW (ref 12.0–15.0)
MCH: 30.9 pg (ref 26.0–34.0)
MCHC: 35.1 g/dL (ref 30.0–36.0)
MCV: 88 fL (ref 78.0–100.0)
Platelets: 185 10*3/uL (ref 150–400)
RBC: 2.75 MIL/uL — ABNORMAL LOW (ref 3.87–5.11)
RDW: 16.6 % — ABNORMAL HIGH (ref 11.5–15.5)
WBC: 10.4 10*3/uL (ref 4.0–10.5)

## 2014-02-01 LAB — POCT I-STAT 3, ART BLOOD GAS (G3+)
Acid-Base Excess: 1 mmol/L (ref 0.0–2.0)
Bicarbonate: 24.5 mEq/L — ABNORMAL HIGH (ref 20.0–24.0)
O2 Saturation: 94 %
Patient temperature: 36.5
TCO2: 26 mmol/L (ref 0–100)
pCO2 arterial: 34.6 mmHg — ABNORMAL LOW (ref 35.0–45.0)
pH, Arterial: 7.457 — ABNORMAL HIGH (ref 7.350–7.450)
pO2, Arterial: 63 mmHg — ABNORMAL LOW (ref 80.0–100.0)

## 2014-02-01 LAB — GLUCOSE, CAPILLARY
Glucose-Capillary: 128 mg/dL — ABNORMAL HIGH (ref 70–99)
Glucose-Capillary: 177 mg/dL — ABNORMAL HIGH (ref 70–99)
Glucose-Capillary: 188 mg/dL — ABNORMAL HIGH (ref 70–99)
Glucose-Capillary: 196 mg/dL — ABNORMAL HIGH (ref 70–99)
Glucose-Capillary: 220 mg/dL — ABNORMAL HIGH (ref 70–99)
Glucose-Capillary: 232 mg/dL — ABNORMAL HIGH (ref 70–99)

## 2014-02-01 LAB — COMPREHENSIVE METABOLIC PANEL
ALT: 75 U/L — ABNORMAL HIGH (ref 0–35)
AST: 44 U/L — ABNORMAL HIGH (ref 0–37)
Albumin: 2.4 g/dL — ABNORMAL LOW (ref 3.5–5.2)
Alkaline Phosphatase: 203 U/L — ABNORMAL HIGH (ref 39–117)
Anion gap: 16 — ABNORMAL HIGH (ref 5–15)
BUN: 25 mg/dL — ABNORMAL HIGH (ref 6–23)
CO2: 23 mEq/L (ref 19–32)
Calcium: 7.6 mg/dL — ABNORMAL LOW (ref 8.4–10.5)
Chloride: 95 mEq/L — ABNORMAL LOW (ref 96–112)
Creatinine, Ser: 1.17 mg/dL — ABNORMAL HIGH (ref 0.50–1.10)
GFR calc Af Amer: 52 mL/min — ABNORMAL LOW (ref >90)
GFR calc non Af Amer: 45 mL/min — ABNORMAL LOW (ref >90)
Glucose, Bld: 189 mg/dL — ABNORMAL HIGH (ref 70–99)
Potassium: 3.4 mEq/L — ABNORMAL LOW (ref 3.7–5.3)
Sodium: 134 mEq/L — ABNORMAL LOW (ref 137–147)
Total Bilirubin: 1.2 mg/dL (ref 0.3–1.2)
Total Protein: 5.7 g/dL — ABNORMAL LOW (ref 6.0–8.3)

## 2014-02-01 LAB — POCT I-STAT 4, (NA,K, GLUC, HGB,HCT)
Glucose, Bld: 244 mg/dL — ABNORMAL HIGH (ref 70–99)
HCT: 27 % — ABNORMAL LOW (ref 36.0–46.0)
Hemoglobin: 9.2 g/dL — ABNORMAL LOW (ref 12.0–15.0)
Potassium: 3.4 mEq/L — ABNORMAL LOW (ref 3.7–5.3)
Sodium: 131 mEq/L — ABNORMAL LOW (ref 137–147)

## 2014-02-01 MED ORDER — POTASSIUM CHLORIDE 20 MEQ/15ML (10%) PO LIQD
40.0000 meq | Freq: Every day | ORAL | Status: DC
Start: 2014-02-01 — End: 2014-02-02
  Administered 2014-02-01: 40 meq
  Filled 2014-02-01 (×2): qty 30

## 2014-02-01 MED ORDER — METOLAZONE 10 MG PO TABS
10.0000 mg | ORAL_TABLET | Freq: Every day | ORAL | Status: AC
  Administered 2014-02-01 – 2014-02-02 (×2): 10 mg
  Filled 2014-02-01 (×2): qty 1

## 2014-02-01 MED ORDER — POTASSIUM CHLORIDE 10 MEQ/50ML IV SOLN
INTRAVENOUS | Status: AC
  Administered 2014-02-01: 10 meq
  Filled 2014-02-01: qty 150

## 2014-02-01 MED ORDER — INSULIN GLARGINE 100 UNIT/ML ~~LOC~~ SOLN
20.0000 [IU] | Freq: Two times a day (BID) | SUBCUTANEOUS | Status: DC
  Administered 2014-02-01 – 2014-02-07 (×12): 20 [IU] via SUBCUTANEOUS
  Filled 2014-02-01 (×14): qty 0.2

## 2014-02-01 MED ORDER — POTASSIUM CHLORIDE 10 MEQ/50ML IV SOLN
10.0000 meq | INTRAVENOUS | Status: AC | PRN
  Administered 2014-02-01 (×3): 10 meq via INTRAVENOUS
  Filled 2014-02-01: qty 50

## 2014-02-01 NOTE — Progress Notes (Signed)
7 Days Post-Op Procedure(s) (LRB): CORONARY ARTERY BYPASS GRAFTING TIMES FOUR USING LEFT INTERNAL MAMMARY ARTERY TO LAD, SAPHENOUS VEIN GRAFTS TO OM1, OM2, AND PDA (N/A) MITRAL VALVE (MV) REPLACEMENT (N/A) Subjective:  Intubated. Opens eyes and responds  Objective: Vital signs in last 24 hours: Temp:  [97 F (36.1 C)-97.9 F (36.6 C)] 97.5 F (36.4 C) (07/19 1130) Pulse Rate:  [73-80] 73 (07/19 1130) Cardiac Rhythm:  [-] Normal sinus rhythm (07/19 0800) Resp:  [11-35] 35 (07/19 1130) BP: (86-142)/(51-70) 106/56 mmHg (07/19 1100) SpO2:  [92 %-100 %] 99 % (07/19 1130) Arterial Line BP: (96-147)/(51-82) 129/58 mmHg (07/19 1130) FiO2 (%):  [30 %] 30 % (07/19 1144) Weight:  [113.5 kg (250 lb 3.6 oz)] 113.5 kg (250 lb 3.6 oz) (07/19 0400)  Hemodynamic parameters for last 24 hours: PAP: (28-51)/(15-35) 42/25 mmHg CVP:  [5 mmHg-50 mmHg] 22 mmHg CO:  [3.5 L/min-4.1 L/min] 3.5 L/min CI:  [1.8 L/min/m2-2.1 L/min/m2] 1.8 L/min/m2  Intake/Output from previous day: 07/18 0701 - 07/19 0700 In: 3090.4 [I.V.:2268.1; NG/GT:372.3; IV Piggyback:450] Out: 2835 [Urine:2635; Emesis/NG output:200] Intake/Output this shift: Total I/O In: 700.5 [I.V.:420.5; NG/GT:180; IV Piggyback:100] Out: 750 [Urine:750]  General appearance: slowed mentation Heart: regular rate and rhythm, S1, S2 normal, no murmur, click, rub or gallop Lungs: clear to auscultation bilaterally Abdomen: soft, non-tender; bowel sounds normal; no masses,  no organomegaly Extremities: edema moderate generalized edema Wound: incision ok  Lab Results:  Recent Labs  01/31/14 0427 01/31/14 1735 02/01/14 0430  WBC 10.7*  --  10.4  HGB 8.4* 9.2* 8.5*  HCT 24.3* 27.0* 24.2*  PLT 137*  --  185   BMET:  Recent Labs  01/31/14 0427 01/31/14 1735 02/01/14 0430  NA 134* 134* 134*  K 3.3* 3.7 3.4*  CL 96 98 95*  CO2 23  --  23  GLUCOSE 119* 178* 189*  BUN 25* 25* 25*  CREATININE 1.31* 1.50* 1.17*  CALCIUM 7.4*  --  7.6*     PT/INR: No results found for this basename: LABPROT, INR,  in the last 72 hours ABG    Component Value Date/Time   PHART 7.457* 02/01/2014 0421   HCO3 24.5* 02/01/2014 0421   TCO2 26 02/01/2014 0421   ACIDBASEDEF 0.1 01/30/2014 0339   O2SAT 49.8 02/01/2014 0450   CBG (last 3)   Recent Labs  02/01/14 0418 02/01/14 0752 02/01/14 1147  GLUCAP 177* 188* 196*    Assessment/Plan: S/P Procedure(s) (LRB): CORONARY ARTERY BYPASS GRAFTING TIMES FOUR USING LEFT INTERNAL MAMMARY ARTERY TO LAD, SAPHENOUS VEIN GRAFTS TO OM1, OM2, AND PDA (N/A) MITRAL VALVE (MV) REPLACEMENT (N/A)  She is hemodynamically stable on dop 5, milrinone 0.3 and epi 1.5. CI is 1.8 and Co-ox is 50%. Will try to wean epi. She needs a repeat echo tomorrow to assess LV function.  VDRF: Her CXR looks fairly good but have not weaned yet due to inotrope requirement and volume excess.  Volume excess: 13 lbs over preop wt. She is making adequate urine on lasix 8 mg/hr. Will give her a dose of zaroxolyn to see if I can increase diuresis since she has good BP and creat ok.  Continue tube feeds. No residuals so far.   LOS: 7 days    [name] K 02/01/2014

## 2014-02-01 NOTE — Progress Notes (Signed)
ANTICOAGULATION CONSULT NOTE - Follow Up Consult  Pharmacy Consult for Angiomax Indication: MVR w/ thrombocytopenia  Labs:  Recent Labs  01/30/14 0400  01/30/14 2200 01/31/14 0427 01/31/14 1735 02/01/14 0430  HGB 8.1*  < >  --  8.4* 9.2* 8.5*  HCT 23.4*  < >  --  24.3* 27.0* 24.2*  PLT 81*  --   --  137*  --  185  APTT 62*  --  64* 64*  --  62*  CREATININE 1.47*  < >  --  1.31* 1.50* 1.17*  < > = values in this interval not displayed.  Assessment/Plan:  73yo female remains therapeutic on Angiomax after resumed s/p balloon removal. H/H trended down slightly. Plt trended up. Will continue gtt at current rate and continue to monitor AM aPTT.   Vinnie Level, PharmD.  Clinical Pharmacist Pager [phone]

## 2014-02-02 ENCOUNTER — Inpatient Hospital Stay (HOSPITAL_COMMUNITY): Payer: Medicare Other

## 2014-02-02 DIAGNOSIS — I059 Rheumatic mitral valve disease, unspecified: Secondary | ICD-10-CM

## 2014-02-02 LAB — GLUCOSE, CAPILLARY
Glucose-Capillary: 137 mg/dL — ABNORMAL HIGH (ref 70–99)
Glucose-Capillary: 154 mg/dL — ABNORMAL HIGH (ref 70–99)
Glucose-Capillary: 162 mg/dL — ABNORMAL HIGH (ref 70–99)
Glucose-Capillary: 163 mg/dL — ABNORMAL HIGH (ref 70–99)
Glucose-Capillary: 177 mg/dL — ABNORMAL HIGH (ref 70–99)
Glucose-Capillary: 184 mg/dL — ABNORMAL HIGH (ref 70–99)

## 2014-02-02 LAB — POCT I-STAT 3, ART BLOOD GAS (G3+)
Acid-Base Excess: 9 mmol/L — ABNORMAL HIGH (ref 0.0–2.0)
Bicarbonate: 31.4 mEq/L — ABNORMAL HIGH (ref 20.0–24.0)
O2 Saturation: 96 %
Patient temperature: 36.8
TCO2: 32 mmol/L (ref 0–100)
pCO2 arterial: 35.1 mmHg (ref 35.0–45.0)
pH, Arterial: 7.56 — ABNORMAL HIGH (ref 7.350–7.450)
pO2, Arterial: 68 mmHg — ABNORMAL LOW (ref 80.0–100.0)

## 2014-02-02 LAB — CARBOXYHEMOGLOBIN
Carboxyhemoglobin: 1.3 % (ref 0.5–1.5)
Methemoglobin: 0.9 % (ref 0.0–1.5)
O2 Saturation: 49.5 %
Total hemoglobin: 9.3 g/dL — ABNORMAL LOW (ref 12.0–16.0)

## 2014-02-02 LAB — COMPREHENSIVE METABOLIC PANEL
ALT: 58 U/L — ABNORMAL HIGH (ref 0–35)
AST: 35 U/L (ref 0–37)
Albumin: 2.3 g/dL — ABNORMAL LOW (ref 3.5–5.2)
Alkaline Phosphatase: 205 U/L — ABNORMAL HIGH (ref 39–117)
Anion gap: 15 (ref 5–15)
BUN: 29 mg/dL — ABNORMAL HIGH (ref 6–23)
CO2: 25 mEq/L (ref 19–32)
Calcium: 8.1 mg/dL — ABNORMAL LOW (ref 8.4–10.5)
Chloride: 93 mEq/L — ABNORMAL LOW (ref 96–112)
Creatinine, Ser: 1.13 mg/dL — ABNORMAL HIGH (ref 0.50–1.10)
GFR calc Af Amer: 54 mL/min — ABNORMAL LOW (ref >90)
GFR calc non Af Amer: 47 mL/min — ABNORMAL LOW (ref >90)
Glucose, Bld: 194 mg/dL — ABNORMAL HIGH (ref 70–99)
Potassium: 3.6 mEq/L — ABNORMAL LOW (ref 3.7–5.3)
Sodium: 133 mEq/L — ABNORMAL LOW (ref 137–147)
Total Bilirubin: 1 mg/dL (ref 0.3–1.2)
Total Protein: 5.9 g/dL — ABNORMAL LOW (ref 6.0–8.3)

## 2014-02-02 LAB — CBC
HCT: 25.3 % — ABNORMAL LOW (ref 36.0–46.0)
Hemoglobin: 8.6 g/dL — ABNORMAL LOW (ref 12.0–15.0)
MCH: 30.1 pg (ref 26.0–34.0)
MCHC: 34 g/dL (ref 30.0–36.0)
MCV: 88.5 fL (ref 78.0–100.0)
Platelets: 218 10*3/uL (ref 150–400)
RBC: 2.86 MIL/uL — ABNORMAL LOW (ref 3.87–5.11)
RDW: 16.8 % — ABNORMAL HIGH (ref 11.5–15.5)
WBC: 11.6 10*3/uL — ABNORMAL HIGH (ref 4.0–10.5)

## 2014-02-02 LAB — APTT: aPTT: 60 seconds — ABNORMAL HIGH (ref 24–37)

## 2014-02-02 MED ORDER — POTASSIUM CHLORIDE 20 MEQ/15ML (10%) PO LIQD
40.0000 meq | Freq: Two times a day (BID) | ORAL | Status: DC
  Administered 2014-02-02 – 2014-02-07 (×11): 40 meq
  Filled 2014-02-02 (×14): qty 30

## 2014-02-02 MED ORDER — AMIODARONE HCL 200 MG PO TABS
200.0000 mg | ORAL_TABLET | Freq: Two times a day (BID) | ORAL | Status: DC
  Administered 2014-02-02 – 2014-02-07 (×11): 200 mg via NASOGASTRIC
  Filled 2014-02-02 (×14): qty 1

## 2014-02-02 MED ORDER — POTASSIUM CHLORIDE 10 MEQ/50ML IV SOLN
10.0000 meq | INTRAVENOUS | Status: AC | PRN
  Administered 2014-02-02 (×3): 10 meq via INTRAVENOUS

## 2014-02-02 MED ORDER — METOLAZONE 10 MG PO TABS
10.0000 mg | ORAL_TABLET | Freq: Every day | ORAL | Status: DC
  Administered 2014-02-03 – 2014-02-06 (×4): 10 mg via ORAL
  Filled 2014-02-02 (×4): qty 1

## 2014-02-02 MED FILL — Medication: Qty: 1 | Status: AC

## 2014-02-02 NOTE — Progress Notes (Signed)
Patient ID: [name], female   DOB: Apr 03, 1941, 73 y.o.   MRN: [id number] EVENING ROUNDS NOTE :     [address]       [place] [address]             [phone]                 8 Days Post-Op Procedure(s) (LRB): CORONARY ARTERY BYPASS GRAFTING TIMES FOUR USING LEFT INTERNAL MAMMARY ARTERY TO LAD, SAPHENOUS VEIN GRAFTS TO OM1, OM2, AND PDA (N/A) MITRAL VALVE (MV) REPLACEMENT (N/A)  Total Length of Stay:  LOS: 8 days  BP 127/60  Pulse 73  Temp(Src) 98.2 F (36.8 C) (Core (Comment))  Resp 17  Ht 5\' 7"  (1.702 m)  Wt 248 lb 0.3 oz (112.5 kg)  BMI 38.84 kg/m2  SpO2 99%  .Intake/Output     07/19 0701 - 07/20 0700 07/20 0701 - 07/21 0700   I.V. (mL/kg) 2257.9 (20.1) 843.7 (7.5)   NG/GT 695 435   IV Piggyback 500 100   Total Intake(mL/kg) 3452.9 (30.7) 1378.7 (12.3)   Urine (mL/kg/hr) 4727 (1.8) 2425 (2)   Emesis/NG output     Total Output 4727 2425   Net -1274.1 -1046.3          . sodium chloride 20 mL/hr at 02/01/14 1300  . sodium chloride Stopped (02/01/14 0830)  . sodium chloride 20 mL/hr at 02/01/14 1000  . sodium chloride 250 mL (02/01/14 1000)  . bivalirudin (ANGIOMAX) infusion 0.5 mg/mL (Non-ACS indications) 0.04 mg/kg/hr (02/02/14 1600)  . DOPamine 5 mcg/kg/min (02/02/14 1600)  . epinephrine 1.5 mcg/min (02/02/14 1600)  . feeding supplement (VITAL HIGH PROTEIN) 1,000 mL (02/02/14 1600)  . furosemide (LASIX) infusion 8 mg/hr (02/02/14 1600)  . lactated ringers 10 mL/hr at 02/01/14 1300  . milrinone 0.3 mcg/kg/min (02/02/14 1600)     Lab Results  Component Value Date   WBC 11.6* 02/02/2014   HGB 8.6* 02/02/2014   HCT 25.3* 02/02/2014   PLT 218 02/02/2014   GLUCOSE 194* 02/02/2014   CHOL 82 01/26/2014   TRIG 115 01/26/2014   HDL 20* 01/26/2014   LDLCALC 39 01/26/2014   ALT 58* 02/02/2014   AST 35 02/02/2014   NA 133* 02/02/2014   K 3.6* 02/02/2014   CL 93* 02/02/2014   CREATININE 1.13* 02/02/2014   BUN 29* 02/02/2014   CO2 25 02/02/2014   INR  1.73* 01/26/2014   HGBA1C 8.4* 01/26/2014   weaned from vent for 6 hours today Remove s/g soon Slowly weaning  Delight Ovens MD  Beeper [phone] Office [phone] 02/02/2014 5:51 PM

## 2014-02-02 NOTE — Progress Notes (Signed)
NUTRITION FOLLOW UP  DOCUMENTATION CODES Per approved criteria  -Obesity Unspecified   INTERVENTION:  Recommend increasing Vital HP formula by 10 ml q 4 hours to goal rate of 45 ml/hr with Prostat liquid protein 30 ml BID via tube to provide 1280 kcals (67% of estimated kcal needs), 125 gm protein (100% of estimated protein needs), 903 ml of free water RD to follow for nutrition care plan  NUTRITION DIAGNOSIS: Inadequate oral intake related to inability to eat as evidenced by npo status, ongoing  Goal: Enteral nutrition to provide 60-70% of estimated calorie needs (22-25 kcals/kg ideal body weight) and 100% of estimated protein needs, based on ASPEN guidelines for permissive underfeeding in critically ill obese individuals, currently unmet  Monitor:  TF regimen & tolerance, respiratory status, weight, labs, I/O's  ASSESSMENT: PMHx significant for CAD, TIA, HTN, DM2, PAD. Admitted with n/v, diaphoresis and chest pain. Work-up reveals STEMI.  Underwent L heart catheterization with coronary angiogram on 7/12. Work-up reveals severe multivessel CAD. Pt then went to OR for emergent CABG x 4 on 7/13.  Patient is currently intubated on ventilator support MV: 10.5 L/min Temp (24hrs), Avg:98.2 F (36.8 C), Min:97.7 F (36.5 C), Max:98.6 F (37 C)   Vital HP formula initiated 7/18 -- currently infusing at 25 ml/hr via Panda tube (tip in distal duodenum near ligament of Treitz) providing 600 kcals, 53 gm protein, 502 ml of free water.  Patient diuresing well with Lasix; still very edematous with some pulmonary edema and pleural effusions.  Height: Ht Readings from Last 1 Encounters:  01/31/14 5\' 7"  (1.702 m)    Weight: Wt Readings from Last 1 Encounters:  02/02/14 248 lb 0.3 oz (112.5 kg)    BMI:  Body mass index is 38.84 kg/(m^2).   Estimated Nutritional Needs: Kcal: 1908 Protein: 125-135 gm Fluid: per MD  Skin: leg and chest incisions  Diet Order:  NPO   Intake/Output Summary (Last 24 hours) at 02/02/14 1403 Last data filed at 02/02/14 1300  Gross per 24 hour  Intake 3211.98 ml  Output   5597 ml  Net -2385.02 ml    Labs:   Recent Labs Lab 01/26/14 1615  01/27/14 0415 01/27/14 1530  01/31/14 0427 01/31/14 1735 02/01/14 0430 02/01/14 1933 02/02/14 0406  NA  --   < > 141  --   < > 134* 134* 134* 131* 133*  K  --   < > 3.5*  --   < > 3.3* 3.7 3.4* 3.4* 3.6*  CL  --   < > 102  --   < > 96 98 95*  --  93*  CO2  --   --  18*  --   < > 23  --  23  --  25  BUN  --   < > 13  --   < > 25* 25* 25*  --  29*  CREATININE 0.98  < > 1.20* 1.31*  < > 1.31* 1.50* 1.17*  --  1.13*  CALCIUM  --   --  7.9*  --   < > 7.4*  --  7.6*  --  8.1*  MG 2.3  --  2.0 1.8  --   --   --   --   --   --   GLUCOSE  --   < > 234*  --   < > 119* 178* 189* 244* 194*  < > = values in this interval not displayed.  CBG (last 3)  Recent Labs  02/02/14 0403 02/02/14 0743 02/02/14 1140  GLUCAP 177* 163* 162*    Scheduled Meds: . amiodarone  200 mg Per NG tube BID  . antiseptic oral rinse  15 mL Mouth Rinse QID  . aspirin EC  325 mg Oral Daily   Or  . aspirin  324 mg Per Tube Daily  . atorvastatin  80 mg Oral q1800  . bisacodyl  10 mg Oral Daily   Or  . bisacodyl  10 mg Rectal Daily  . cefTAZidime (FORTAZ)  IV  1 g Intravenous 3 times per day  . chlorhexidine  15 mL Mouth Rinse BID  . docusate  200 mg Oral Daily  . feeding supplement (PRO-STAT SUGAR FREE 64)  30 mL Oral BID  . insulin aspart  0-24 Units Subcutaneous 6 times per day  . insulin glargine  20 Units Subcutaneous BID  . levothyroxine  75 mcg Oral QAC breakfast  . [START ON 02/03/2014] metolazone  10 mg Oral Daily  . milrinone  50 mcg/kg Intravenous Once  . pantoprazole sodium  40 mg Per Tube Daily  . potassium chloride  40 mEq Per Tube BID  . sodium chloride  10-40 mL Intracatheter Q12H  . sodium chloride  3 mL Intravenous Q12H    Continuous Infusions: . sodium chloride 20  mL/hr at 02/01/14 1300  . sodium chloride Stopped (02/01/14 0830)  . sodium chloride 20 mL/hr at 02/01/14 1000  . sodium chloride 250 mL (02/01/14 1000)  . bivalirudin (ANGIOMAX) infusion 0.5 mg/mL (Non-ACS indications) 0.04 mg/kg/hr (02/02/14 1300)  . dexmedetomidine Stopped (01/27/14 1500)  . DOPamine 5 mcg/kg/min (02/02/14 1300)  . epinephrine 1.5 mcg/min (02/02/14 1300)  . feeding supplement (VITAL HIGH PROTEIN) 1,000 mL (02/02/14 1300)  . furosemide (LASIX) infusion 8 mg/hr (02/02/14 1300)  . lactated ringers 10 mL/hr at 02/01/14 1300  . milrinone 0.3 mcg/kg/min (02/02/14 1300)    Past Medical History  Diagnosis Date  . History of MI (myocardial infarction)     [facility] (cannot get cath report on Care Everywhere(  . Essential hypertension   . TIA (transient ischemic attack)   . Thyroid disease   . Anxiety   . Diabetes mellitus type 2 with peripheral artery disease     On insulin  . PAD (peripheral artery disease)   . Hyperlipidemia associated with type 2 diabetes mellitus     Past Surgical History  Procedure Laterality Date  . Cardiac catheterization      PCI - unknown vessel or stent; unknown date; @ [facility].  . Abdominal hysterectomy    . Cholecystectomy    . Coronary artery bypass graft N/A 01/25/2014    Procedure: CORONARY ARTERY BYPASS GRAFTING TIMES FOUR USING LEFT INTERNAL MAMMARY ARTERY TO LAD, SAPHENOUS VEIN GRAFTS TO OM1, OM2, AND PDA;  Surgeon: Kerin PernaPeter Van Trigt, MD;  [facility];  Service: Open Heart Surgery;  Laterality: N/A;  . Mitral valve replacement N/A 01/25/2014    Procedure: MITRAL VALVE (MV) REPLACEMENT;  Surgeon: Kerin PernaPeter Van Trigt, MD;  [facility];  Service: Open Heart Surgery;  Laterality: N/A;    Maureen ChattersKatie Maecyn Panning, RD, LDN Pager #: [phone] After-Hours Pager #: [phone]

## 2014-02-02 NOTE — Progress Notes (Signed)
UR Completed.  [name] [phone] 02/02/2014  

## 2014-02-02 NOTE — Progress Notes (Addendum)
[facility] DAILY ICU PROGRESS NOTE                   [address]            [place] [address]          [phone]   8 Days Post-Op Procedure(s) (LRB): CORONARY ARTERY BYPASS GRAFTING TIMES FOUR USING LEFT INTERNAL MAMMARY ARTERY TO LAD, SAPHENOUS VEIN GRAFTS TO OM1, OM2, AND PDA (N/A) MITRAL VALVE (MV) REPLACEMENT (N/A)  Total Length of Stay:  LOS: 8 days   Subjective: Opens eyes, follows commands. Nods appropriately to questions.  Stable overnight.   Objective: Vital signs in last 24 hours: Temp:  [97.5 F (36.4 C)-98.6 F (37 C)] 97.9 F (36.6 C) (07/20 0700) Pulse Rate:  [65-77] 73 (07/20 0700) Cardiac Rhythm:  [-] Normal sinus rhythm (07/20 0600) Resp:  [13-51] 20 (07/20 0700) BP: (86-144)/(51-65) 127/59 mmHg (07/20 0700) SpO2:  [95 %-100 %] 99 % (07/20 0700) Arterial Line BP: (72-155)/(53-115) 135/60 mmHg (07/20 0700) FiO2 (%):  [30 %] 30 % (07/20 0600) Weight:  [248 lb 0.3 oz (112.5 kg)] 248 lb 0.3 oz (112.5 kg) (07/20 0500)  Filed Weights   01/31/14 0600 02/01/14 0400 02/02/14 0500  Weight: 253 lb 15.5 oz (115.2 kg) 250 lb 3.6 oz (113.5 kg) 248 lb 0.3 oz (112.5 kg)    Weight change: -2 lb 3.3 oz (-1 kg)   Hemodynamic parameters for last 24 hours: PAP: (28-59)/(18-41) 38/24 mmHg CVP:  [6 mmHg-48 mmHg] 13 mmHg CO:  [3.5 L/min-4.3 L/min] 3.7 L/min CI:  [1.6 L/min/m2-2.2 L/min/m2] 1.9 L/min/m2  Intake/Output from previous day: 07/19 0701 - 07/20 0700 In: 3452.9 [I.V.:2257.9; NG/GT:695; IV Piggyback:500] Out: 4727 [Urine:4727]  CBGs [id number]    Current Meds: Scheduled Meds: . antiseptic oral rinse  15 mL Mouth Rinse QID  . aspirin EC  325 mg Oral Daily   Or  . aspirin  324 mg Per Tube Daily  . atorvastatin  80 mg Oral q1800  . bisacodyl  10 mg Oral Daily   Or  . bisacodyl  10 mg Rectal Daily  . cefTAZidime (FORTAZ)  IV  1 g Intravenous 3 times per day  . chlorhexidine  15 mL Mouth Rinse BID  . docusate  200 mg Oral Daily   . feeding supplement (PRO-STAT SUGAR FREE 64)  30 mL Oral BID  . insulin aspart  0-24 Units Subcutaneous 6 times per day  . insulin glargine  20 Units Subcutaneous BID  . insulin regular  0-10 Units Intravenous TID WC  . levothyroxine  75 mcg Oral QAC breakfast  . metolazone  10 mg Per Tube Daily  . milrinone  50 mcg/kg Intravenous Once  . pantoprazole sodium  40 mg Per Tube Daily  . potassium chloride  40 mEq Per Tube Daily  . sodium chloride  10-40 mL Intracatheter Q12H  . sodium chloride  3 mL Intravenous Q12H   Continuous Infusions: . sodium chloride 20 mL/hr at 02/01/14 1300  . sodium chloride Stopped (02/01/14 0830)  . sodium chloride 20 mL/hr at 02/01/14 1000  . sodium chloride 250 mL (02/01/14 1000)  . amiodarone 30 mg/hr (02/02/14 0700)  . bivalirudin (ANGIOMAX) infusion 0.5 mg/mL (Non-ACS indications) 0.04 mg/kg/hr (02/02/14 0700)  . dexmedetomidine Stopped (01/27/14 1500)  . DOPamine 5 mcg/kg/min (02/02/14 0700)  . epinephrine 1.5 mcg/min (02/02/14 0700)  . feeding supplement (VITAL HIGH PROTEIN) 1,000 mL (02/02/14 0700)  . furosemide (LASIX) infusion 8 mg/hr (02/02/14 0700)  .  lactated ringers 10 mL/hr at 02/01/14 1300  . milrinone 0.3 mcg/kg/min (02/02/14 0700)   PRN Meds:.HYDROcodone-acetaminophen, midazolam, morphine injection, ondansetron (ZOFRAN) IV, potassium chloride, sodium chloride, sodium chloride   Physical Exam: General appearance: On vent, opens eyes and follows commands, still appears sluggish Heart: regular rate and rhythm Lungs: diminished breath sounds bilaterally Abdomen: soft, non-tender; bowel sounds normal; no masses,  no organomegaly Extremities: +LE edema Wound: Clean and dry     Lab Results: CBC: Recent Labs  02/01/14 0430 02/01/14 1933 02/02/14 0406  WBC 10.4  --  11.6*  HGB 8.5* 9.2* 8.6*  HCT 24.2* 27.0* 25.3*  PLT 185  --  218   BMET:  Recent Labs  02/01/14 0430 02/01/14 1933 02/02/14 0406  NA 134* 131* 133*  K 3.4*  3.4* 3.6*  CL 95*  --  93*  CO2 23  --  25  GLUCOSE 189* 244* 194*  BUN 25*  --  29*  CREATININE 1.17*  --  1.13*  CALCIUM 7.6*  --  8.1*    PT/INR: No results found for this basename: LABPROT, INR,  in the last 72 hours Radiology: Dg Chest Port 1 View  02/01/2014   CLINICAL DATA:  Respiratory failure and status post CABG and mitral valve replacement.  EXAM: PORTABLE CHEST - 1 VIEW  COMPARISON:  01/31/2014  FINDINGS: Endotracheal tube remains with tip approximately 5 cm above the carina. Swan-Ganz catheter positioning is stable in the right pulmonary artery. Right PICC line extends into SVC. Lungs show slight improvement in left lower lobe atelectasis. There remains a small left pleural effusion. Mild interstitial edema is stable. The heart size and mediastinal contours are stable. No pneumothorax.  IMPRESSION: Slight diminishment in left lower lobe atelectasis. Stable mild interstitial edema and small left pleural effusion.   Electronically Signed   By: Irish Lack M.D.   On: 02/01/2014 08:23   Dg Abd Portable 1v  02/01/2014   CLINICAL DATA:  Feeding tube placement. Acute myocardial infarct. Postop from CABG and mitral valve replacement.  EXAM: PORTABLE ABDOMEN - 1 VIEW  COMPARISON:  01/30/2014  FINDINGS: A feeding tube is seen in place with the tip in the distal duodenum near the ligament of Treitz. A nasogastric tube is seen with tip overlying the proximal stomach. The bowel gas pattern is normal.  IMPRESSION: Feeding tube tip in distal duodenum near the ligament of Treitz. Nasogastric tube tip in proximal stomach.   Electronically Signed   By: Myles Rosenthal M.D.   On: 02/01/2014 08:19     Assessment/Plan: S/P Procedure(s) (LRB): CORONARY ARTERY BYPASS GRAFTING TIMES FOUR USING LEFT INTERNAL MAMMARY ARTERY TO LAD, SAPHENOUS VEIN GRAFTS TO OM1, OM2, AND PDA (N/A) MITRAL VALVE (MV) REPLACEMENT (N/A)  CV- Maintaining SR, BPs stable.  Remains on multiple drips (Dop, Epi, Milrinone, Amio,  Lasix).  Co-ox 1.3.  Continue to wean drips as able. For echo today to assess LV function.  VDRF- awake and alert on vent.  Following commands.  CXR stable.  Has not been weaned yet due to multiple gtts.  Hopefully can start to wean soon.  Vol overload- diuresing well on Lasix gtt, IV Zaroxolyn.   Nutrition- continue TFs for now.  Expected post op blood loss anemia- [name]/[name] down some this am.  Watch.  Thrombocytopenia- plts normalizing, HIT negative.  Continue bivalirudin for prosthetic MV.   [name],[name] 02/02/2014 7:39 AM   Chart reviewed, patient examined, agree with above. She is stable on current inotropes but Co-ox  sat on 49.5. CI 1.7. She is diuresing well with lasix and zaroxolyn with normal creat now. She is still very edematous and has some pulmonary edema and pleural effusions. It is hard to tell what her baseline preop wt was. It was recorded at 180 lbs but she is 248 now and does not look 68 lbs over baseline. Will continue current inotropes and check echo for LV function. Will start weaning vent to let her work. I think she needs more diuresis before extubation.

## 2014-02-02 NOTE — Progress Notes (Signed)
  Echocardiogram 2D Echocardiogram has been performed.  Leta Jungling M 02/02/2014, 11:39 AM

## 2014-02-02 NOTE — Progress Notes (Signed)
ANTICOAGULATION CONSULT NOTE - Follow Up Consult  Pharmacy Consult for Bivalirudin Indication: MVR  No Known Allergies  Patient Measurements: Height: 5\' 7"  (170.2 cm) Weight: 248 lb 0.3 oz (112.5 kg) IBW/kg (Calculated) : 61.6 Bivalirudin Dosing Weight: 107.4 kg  Vital Signs: Temp: 97.9 F (36.6 C) (07/20 0700) Temp src: Core (Comment) (07/20 0800) BP: 124/54 mmHg (07/20 0928) Pulse Rate: 74 (07/20 0928)  Labs:  Recent Labs  01/31/14 0427 01/31/14 1735 02/01/14 0430 02/01/14 1933 02/02/14 0406  HGB 8.4* 9.2* 8.5* 9.2* 8.6*  HCT 24.3* 27.0* 24.2* 27.0* 25.3*  PLT 137*  --  185  --  218  APTT 64*  --  62*  --  60*  CREATININE 1.31* 1.50* 1.17*  --  1.13*    Estimated Creatinine Clearance: 56.5 ml/min (by C-G formula based on Cr of 1.13).  Assessment:   PTT remains therapeutic (60 seconds) on Angiomax at 0.04 mg/kg/hr.  Platelet count has returned to normal range. HIT negative 01/28/14.  Goal of Therapy:  aPTT 50-90 seconds Monitor platelets by anticoagulation protocol: Yes   Plan:   Continue Angiomax at 0.04 mg/kg/hr.  Daily aPTT and CBC.  Dennie Fetters, RPh Pager: [phone] 02/02/2014,10:32 AM

## 2014-02-03 ENCOUNTER — Inpatient Hospital Stay (HOSPITAL_COMMUNITY): Payer: Medicare Other

## 2014-02-03 LAB — URINE CULTURE
COLONY COUNT: NO GROWTH
CULTURE: NO GROWTH
SPECIAL REQUESTS: NORMAL

## 2014-02-03 LAB — POCT I-STAT 3, ART BLOOD GAS (G3+)
Acid-Base Excess: 11 mmol/L — ABNORMAL HIGH (ref 0.0–2.0)
Bicarbonate: 33.8 mEq/L — ABNORMAL HIGH (ref 20.0–24.0)
O2 Saturation: 94 %
Patient temperature: 36.9
TCO2: 35 mmol/L (ref 0–100)
pCO2 arterial: 37.3 mmHg (ref 35.0–45.0)
pH, Arterial: 7.564 — ABNORMAL HIGH (ref 7.350–7.450)
pO2, Arterial: 60 mmHg — ABNORMAL LOW (ref 80.0–100.0)

## 2014-02-03 LAB — COMPREHENSIVE METABOLIC PANEL
ALT: 47 U/L — ABNORMAL HIGH (ref 0–35)
AST: 35 U/L (ref 0–37)
Albumin: 2.3 g/dL — ABNORMAL LOW (ref 3.5–5.2)
Alkaline Phosphatase: 194 U/L — ABNORMAL HIGH (ref 39–117)
Anion gap: 15 (ref 5–15)
BUN: 35 mg/dL — ABNORMAL HIGH (ref 6–23)
CO2: 29 mEq/L (ref 19–32)
Calcium: 8.2 mg/dL — ABNORMAL LOW (ref 8.4–10.5)
Chloride: 87 mEq/L — ABNORMAL LOW (ref 96–112)
Creatinine, Ser: 1.1 mg/dL (ref 0.50–1.10)
GFR calc Af Amer: 56 mL/min — ABNORMAL LOW (ref >90)
GFR calc non Af Amer: 48 mL/min — ABNORMAL LOW (ref >90)
Glucose, Bld: 196 mg/dL — ABNORMAL HIGH (ref 70–99)
Potassium: 3.5 mEq/L — ABNORMAL LOW (ref 3.7–5.3)
Sodium: 131 mEq/L — ABNORMAL LOW (ref 137–147)
Total Bilirubin: 1.2 mg/dL (ref 0.3–1.2)
Total Protein: 6.1 g/dL (ref 6.0–8.3)

## 2014-02-03 LAB — CARBOXYHEMOGLOBIN
Carboxyhemoglobin: 1.4 % (ref 0.5–1.5)
Methemoglobin: 0.7 % (ref 0.0–1.5)
O2 Saturation: 46.7 %
Total hemoglobin: 10.1 g/dL — ABNORMAL LOW (ref 12.0–16.0)

## 2014-02-03 LAB — GLUCOSE, CAPILLARY
Glucose-Capillary: 144 mg/dL — ABNORMAL HIGH (ref 70–99)
Glucose-Capillary: 148 mg/dL — ABNORMAL HIGH (ref 70–99)
Glucose-Capillary: 152 mg/dL — ABNORMAL HIGH (ref 70–99)
Glucose-Capillary: 161 mg/dL — ABNORMAL HIGH (ref 70–99)
Glucose-Capillary: 189 mg/dL — ABNORMAL HIGH (ref 70–99)
Glucose-Capillary: 191 mg/dL — ABNORMAL HIGH (ref 70–99)
Glucose-Capillary: 193 mg/dL — ABNORMAL HIGH (ref 70–99)

## 2014-02-03 LAB — APTT
aPTT: 46 seconds — ABNORMAL HIGH (ref 24–37)
aPTT: 53 seconds — ABNORMAL HIGH (ref 24–37)
aPTT: 62 seconds — ABNORMAL HIGH (ref 24–37)

## 2014-02-03 LAB — CBC
HCT: 25.6 % — ABNORMAL LOW (ref 36.0–46.0)
Hemoglobin: 8.6 g/dL — ABNORMAL LOW (ref 12.0–15.0)
MCH: 30 pg (ref 26.0–34.0)
MCHC: 33.6 g/dL (ref 30.0–36.0)
MCV: 89.2 fL (ref 78.0–100.0)
Platelets: 222 10*3/uL (ref 150–400)
RBC: 2.87 MIL/uL — ABNORMAL LOW (ref 3.87–5.11)
RDW: 17.7 % — ABNORMAL HIGH (ref 11.5–15.5)
WBC: 11.8 10*3/uL — ABNORMAL HIGH (ref 4.0–10.5)

## 2014-02-03 MED ORDER — VITAL HIGH PROTEIN PO LIQD
1000.0000 mL | ORAL | Status: DC
  Administered 2014-02-03 – 2014-02-06 (×4): 1000 mL
  Filled 2014-02-03 (×7): qty 1000

## 2014-02-03 MED ORDER — POTASSIUM CHLORIDE 10 MEQ/50ML IV SOLN
10.0000 meq | INTRAVENOUS | Status: AC | PRN
  Administered 2014-02-03 (×3): 10 meq via INTRAVENOUS
  Filled 2014-02-03: qty 50

## 2014-02-03 NOTE — Progress Notes (Signed)
Aline not reading nor pulling back blood. RT cleaned and try to re position it, however,  Aline is out. Removed and held pressure until stopped bleeding. RN aware and stated she would address it with the MD and the next rounding.

## 2014-02-03 NOTE — Progress Notes (Addendum)
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    Objective: Vital signs in last 24 hours: Temp:  [97.7 F (36.5 C)-99.1 F (37.3 C)] 98.2 F (36.8 C) (07/21 0715) Pulse Rate:  [58-79] 70 (07/21 0715) Cardiac Rhythm:  [-] Normal sinus rhythm (07/21 0600) Resp:  [17-34] 22 (07/21 0715) BP: (112-148)/(48-68) 128/67 mmHg (07/21 0700) SpO2:  [92 %-100 %] 99 % (07/21 0715) Arterial Line BP: (69-148)/(50-117) 139/66 mmHg (07/21 0715) FiO2 (%):  [30 %-40 %] 40 % (07/21 0600) Weight:  [243 lb 2.7 oz (110.3 kg)] 243 lb 2.7 oz (110.3 kg) (07/21 0400)  Filed Weights   02/01/14 0400 02/02/14 0500 02/03/14 0400  Weight: 250 lb 3.6 oz (113.5 kg) 248 lb 0.3 oz (112.5 kg) 243 lb 2.7 oz (110.3 kg)    Weight change: -4 lb 13.6 oz (-2.2 kg)   Hemodynamic parameters for last 24 hours: PAP: (36-55)/(18-37) 43/24 mmHg CVP:  [9 mmHg-25 mmHg] 24 mmHg CO:  [3.4 L/min-3.9 L/min] 3.9 L/min CI:  [1.8 L/min/m2-2 L/min/m2] 2 L/min/m2  Intake/Output from previous day: 07/20 0701 - 07/21 0700 In: 3116.1 [I.V.:1911.1; NG/GT:905; IV Piggyback:300] Out: 6485 [Urine:6485]  CBGs 137-154-152-148-196     Current Meds: Scheduled Meds: . amiodarone  200 mg Per NG tube BID  . antiseptic oral rinse  15 mL Mouth Rinse QID  . aspirin EC  325 mg Oral Daily   Or  . aspirin  324 mg Per Tube Daily  . atorvastatin  80 mg Oral q1800  . bisacodyl  10 mg Oral Daily   Or  . bisacodyl  10 mg Rectal Daily  . cefTAZidime (FORTAZ)  IV  1 g Intravenous 3 times per day  . chlorhexidine  15 mL Mouth Rinse BID  . docusate  200 mg  Oral Daily  . feeding supplement (PRO-STAT SUGAR FREE 64)  30 mL Oral BID  . insulin aspart  0-24 Units Subcutaneous 6 times per day  . insulin glargine  20 Units Subcutaneous BID  . levothyroxine  75 mcg Oral QAC breakfast  . metolazone  10 mg Oral Daily  . milrinone  50 mcg/kg Intravenous Once  . pantoprazole sodium  40 mg Per Tube Daily  . potassium chloride  40 mEq Per Tube BID  . sodium chloride  10-40 mL Intracatheter Q12H  . sodium chloride  3 mL Intravenous Q12H   Continuous Infusions: . sodium chloride 20 mL/hr at 02/01/14 1300  . sodium chloride Stopped (02/01/14 0830)  . sodium chloride 20 mL/hr at 02/01/14 1000  . sodium chloride 250 mL (02/01/14 1000)  . bivalirudin (ANGIOMAX) infusion 0.5 mg/mL (Non-ACS indications) 0.04 mg/kg/hr (02/03/14 0600)  . DOPamine 5 mcg/kg/min (02/03/14 0606)  . epinephrine 1.5 mcg/min (02/03/14 0600)  . feeding supplement (VITAL HIGH PROTEIN) 1,000 mL (02/03/14 0600)  . furosemide (LASIX) infusion 8 mg/hr (02/03/14 0600)  . lactated ringers 10 mL/hr at 02/01/14 1300  . milrinone 0.3 mcg/kg/min (  02/03/14 0600)   PRN Meds:.HYDROcodone-acetaminophen, midazolam, morphine injection, ondansetron (ZOFRAN) IV, potassium chloride, sodium chloride, sodium chloride   Physical Exam: General appearance: Opens eyes, follows commands, on vent Heart: regular rate and rhythm Lungs: diminished breath sounds bilaterally Abdomen: soft, non-tender; bowel sounds normal; no masses,  no organomegaly Extremities: +LE edema Wound: Clean and dry  Lab Results: CBC: Recent Labs  02/02/14 0406 02/03/14 0341  WBC 11.6* 11.8*  HGB 8.6* 8.6*  HCT 25.3* 25.6*  PLT 218 222   BMET:  Recent Labs  02/02/14 0406 02/03/14 0341  NA 133* 131*  K 3.6* 3.5*  CL 93* 87*  CO2 25 29  GLUCOSE 194* 196*  BUN 29* 35*  CREATININE 1.13* 1.10  CALCIUM 8.1* 8.2*    PT/INR: No results found for this basename: LABPROT, INR,  in the last 72 hours Radiology: Dg Chest  Port 1 View  02/02/2014   CLINICAL DATA:  Status post CABG and mitral valve repair.  EXAM: PORTABLE CHEST - 1 VIEW  COMPARISON:  Single view of the chest 02/01/2014 and 01/31/2014.  FINDINGS: Support tubes and lines are unchanged. Bilateral effusions and basilar airspace disease have worsened. Cardiomegaly and pulmonary edema are again seen.  IMPRESSION: Increased bilateral effusions and basilar airspace disease.  Pulmonary edema persists.   Electronically Signed   By: Drusilla Kanner M.D.   On: 02/02/2014 07:57   2D Echo (7/20): Study Conclusions  - Left ventricle: The cavity size was normal. Systolic function was normal. The estimated ejection fraction was in the range of 55% to 60%. Wall motion was normal; there were no regional wall motion abnormalities. There was a reduced contribution of atrial contraction to ventricular filling, due to increased ventricular diastolic pressure or atrial contractile dysfunction. Doppler parameters are consistent with a reversible restrictive pattern, indicative of decreased left ventricular diastolic compliance and/or increased left atrial pressure (grade 3 diastolic dysfunction). - Mitral valve: There appears to be heavy mitral annular calcification in the vicinity of MVR. 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 The findings are consistent with mild stenosis. - Pulmonic valve: There was trivial regurgitation. - Pulmonary arteries: PA peak pressure: 41 mm Hg (S).  Impressions:  - The right ventricular systolic pressure was increased consistent with mild pulmonary hypertension.  Transthoracic echocardiography. M-mode, complete 2D, spectral Doppler, and color Doppler. Birthdate: Patient birthdate: July 21, 1940. Age: Patient is 73 yr old. Sex: Gender: female. Height:  Height: 170.2 cm. Height: 67 in. Weight: Weight: 112.5 kg. Weight: 247.5 lb. Body mass index: BMI: 38.8 kg/m^2. Body surface area: BSA: 2.36 m^2. Blood pressure: 124/54 Patient status: Inpatient. Study date: Study date: 02/02/2014. Study time: 10:54 AM. Location: Bedside.     Assessment/Plan: S/P Procedure(s) (LRB): CORONARY ARTERY BYPASS GRAFTING TIMES FOUR USING LEFT INTERNAL MAMMARY ARTERY TO LAD, SAPHENOUS VEIN GRAFTS TO OM1, OM2, AND PDA (N/A) MITRAL VALVE (MV) REPLACEMENT (N/A) CV- Maintaining SR, BPs stable. Remains on multiple drips (Dop, Epi, Milrinone, Amio, Lasix). Co-ox 46% today. Continue current care. Echo results noted above- EF 55-60%.Marland Kitchen VDRF- awake and alert on vent. Following commands. Continue working on slow vent wean. Vol overload- UOP excellent, down 2 kg.  Remains edematous. Continue Lasix gtt, IV Zaroxolyn.  Nutrition- continue TFs for now. Per dietician's note, recommend titrating rate up to 45 ml/hr. Expected post op blood loss anemia- H/H stable. Thrombocytopenia- plts  normalizing, HIT negative. Continue bivalirudin for prosthetic MV.   COLLINS,GINA H 02/03/2014 7:38 AM  Patient seen and examined, agree with above Good LV function by echo(on multiple inotropes) - will try to start slowly weaning pressors  Decrease milrinone to 0.2, wean epi very slowly if tolerated Diuresing well- continue

## 2014-02-03 NOTE — Progress Notes (Addendum)
ANTICOAGULATION CONSULT NOTE - Follow Up Consult  Pharmacy Consult for Bivalirudin Indication: MVR  No Known Allergies  Patient Measurements: Height: 5\' 7"  (170.2 cm) Weight: 243 lb 2.7 oz (110.3 kg) IBW/kg (Calculated) : 61.6 Bivalirudin Dosing Weight: 107.4 kg  Vital Signs: Temp: 98.2 F (36.8 C) (07/21 0715) Temp src: Core (Comment) (07/21 0400) BP: 128/67 mmHg (07/21 0750) Pulse Rate: 69 (07/21 0750)  Labs:  Recent Labs  02/01/14 0430 02/01/14 1933 02/02/14 0406 02/03/14 0341  HGB 8.5* 9.2* 8.6* 8.6*  HCT 24.2* 27.0* 25.3* 25.6*  PLT 185  --  218 222  APTT 62*  --  60* 46*  CREATININE 1.17*  --  1.13* 1.10    Estimated Creatinine Clearance: 57.4 ml/min (by C-G formula based on Cr of 1.1).  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 APTT is now slightly subtherapeutic possibly related to improving renal fxn. H/H is low but has remained stable and plts have recovered and are normalized. HIT panel negative 7/15. No bleeding noted.  Goal of Therapy:  aPTT 50-90 seconds Monitor platelets by anticoagulation protocol: Yes   Plan:  1. Increase bivalirudin to 0.048mg /kg/hr 2. Check a 2 hour aPTT to assess new dose 3. Continue daily aPTT and CBC 4. F/u plans for oral anticoagulation  Lysle Pearl, PharmD, BCPS Pager # [phone] 02/03/2014 8:46 AM  Addendum: 2 hour aPTT is therapeutic at 53. No bleeding noted  Plan: 1. Check a 4 hour aPTT to confirm dosing  Lysle Pearl, PharmD, BCPS Pager # [phone] 02/03/2014 12:17 PM  Addendum:  4 hour aPTT is therapeutic  Plan: 1. Continue bival at 0.048mg /kg/hr 2. F/u AM aPTT and CBC  Lysle Pearl, PharmD, BCPS Pager # [phone] 02/03/2014 3:53 PM

## 2014-02-03 NOTE — Progress Notes (Signed)
T[facility] BRIEF SICU PROGRESS NOTE  9 Days Post-Op  S/P Procedure(s) (LRB): CORONARY ARTERY BYPASS GRAFTING TIMES FOUR USING LEFT INTERNAL MAMMARY ARTERY TO LAD, SAPHENOUS VEIN GRAFTS TO OM1, OM2, AND PDA (N/A) MITRAL VALVE (MV) REPLACEMENT (N/A)   Overall stable day Tolerated some vent weaning NSR w/ stable hemodynamics, milrinone down to 0.2 and slowly weaning Epi Diuresing well  Plan: Continue current plan  Purcell Nails 02/03/2014 7:37 PM

## 2014-02-04 ENCOUNTER — Inpatient Hospital Stay (HOSPITAL_COMMUNITY): Payer: Medicare Other

## 2014-02-04 DIAGNOSIS — J9600 Acute respiratory failure, unspecified whether with hypoxia or hypercapnia: Secondary | ICD-10-CM | POA: Diagnosis not present

## 2014-02-04 DIAGNOSIS — I798 Other disorders of arteries, arterioles and capillaries in diseases classified elsewhere: Secondary | ICD-10-CM

## 2014-02-04 DIAGNOSIS — J810 Acute pulmonary edema: Secondary | ICD-10-CM | POA: Diagnosis not present

## 2014-02-04 DIAGNOSIS — E1159 Type 2 diabetes mellitus with other circulatory complications: Secondary | ICD-10-CM

## 2014-02-04 LAB — CARBOXYHEMOGLOBIN
Carboxyhemoglobin: 1.4 % (ref 0.5–1.5)
Methemoglobin: 0.8 % (ref 0.0–1.5)
O2 Saturation: 52 %
Total hemoglobin: 9.5 g/dL — ABNORMAL LOW (ref 12.0–16.0)

## 2014-02-04 LAB — COMPREHENSIVE METABOLIC PANEL
ALT: 40 U/L — ABNORMAL HIGH (ref 0–35)
AST: 38 U/L — ABNORMAL HIGH (ref 0–37)
Albumin: 2.2 g/dL — ABNORMAL LOW (ref 3.5–5.2)
Alkaline Phosphatase: 169 U/L — ABNORMAL HIGH (ref 39–117)
Anion gap: 11 (ref 5–15)
BUN: 44 mg/dL — ABNORMAL HIGH (ref 6–23)
CO2: 32 mEq/L (ref 19–32)
Calcium: 8.2 mg/dL — ABNORMAL LOW (ref 8.4–10.5)
Chloride: 88 mEq/L — ABNORMAL LOW (ref 96–112)
Creatinine, Ser: 1.2 mg/dL — ABNORMAL HIGH (ref 0.50–1.10)
GFR calc Af Amer: 50 mL/min — ABNORMAL LOW (ref >90)
GFR calc non Af Amer: 43 mL/min — ABNORMAL LOW (ref >90)
Glucose, Bld: 137 mg/dL — ABNORMAL HIGH (ref 70–99)
Potassium: 3.3 mEq/L — ABNORMAL LOW (ref 3.7–5.3)
Sodium: 131 mEq/L — ABNORMAL LOW (ref 137–147)
Total Bilirubin: 1.2 mg/dL (ref 0.3–1.2)
Total Protein: 6.3 g/dL (ref 6.0–8.3)

## 2014-02-04 LAB — CBC
HCT: 26.5 % — ABNORMAL LOW (ref 36.0–46.0)
Hemoglobin: 8.8 g/dL — ABNORMAL LOW (ref 12.0–15.0)
MCH: 30.7 pg (ref 26.0–34.0)
MCHC: 33.2 g/dL (ref 30.0–36.0)
MCV: 92.3 fL (ref 78.0–100.0)
Platelets: 236 10*3/uL (ref 150–400)
RBC: 2.87 MIL/uL — ABNORMAL LOW (ref 3.87–5.11)
RDW: 17.5 % — ABNORMAL HIGH (ref 11.5–15.5)
WBC: 11 10*3/uL — ABNORMAL HIGH (ref 4.0–10.5)

## 2014-02-04 LAB — POCT I-STAT 3, ART BLOOD GAS (G3+)
Acid-Base Excess: 13 mmol/L — ABNORMAL HIGH (ref 0.0–2.0)
Bicarbonate: 36 mEq/L — ABNORMAL HIGH (ref 20.0–24.0)
O2 Saturation: 93 %
Patient temperature: 37.3
TCO2: 37 mmol/L (ref 0–100)
pCO2 arterial: 41 mmHg (ref 35.0–45.0)
pH, Arterial: 7.552 — ABNORMAL HIGH (ref 7.350–7.450)
pO2, Arterial: 58 mmHg — ABNORMAL LOW (ref 80.0–100.0)

## 2014-02-04 LAB — GLUCOSE, CAPILLARY
Glucose-Capillary: 106 mg/dL — ABNORMAL HIGH (ref 70–99)
Glucose-Capillary: 131 mg/dL — ABNORMAL HIGH (ref 70–99)
Glucose-Capillary: 134 mg/dL — ABNORMAL HIGH (ref 70–99)
Glucose-Capillary: 160 mg/dL — ABNORMAL HIGH (ref 70–99)
Glucose-Capillary: 167 mg/dL — ABNORMAL HIGH (ref 70–99)
Glucose-Capillary: 171 mg/dL — ABNORMAL HIGH (ref 70–99)

## 2014-02-04 LAB — MAGNESIUM: Magnesium: 1.5 mg/dL (ref 1.5–2.5)

## 2014-02-04 LAB — POCT I-STAT, CHEM 8
BUN: 39 mg/dL — ABNORMAL HIGH (ref 6–23)
Calcium, Ion: 1.1 mmol/L — ABNORMAL LOW (ref 1.13–1.30)
Chloride: 86 mEq/L — ABNORMAL LOW (ref 96–112)
Creatinine, Ser: 1.4 mg/dL — ABNORMAL HIGH (ref 0.50–1.10)
Glucose, Bld: 158 mg/dL — ABNORMAL HIGH (ref 70–99)
HCT: 30 % — ABNORMAL LOW (ref 36.0–46.0)
Hemoglobin: 10.2 g/dL — ABNORMAL LOW (ref 12.0–15.0)
Potassium: 3.8 mEq/L (ref 3.7–5.3)
Sodium: 130 mEq/L — ABNORMAL LOW (ref 137–147)
TCO2: 31 mmol/L (ref 0–100)

## 2014-02-04 LAB — APTT: aPTT: 60 seconds — ABNORMAL HIGH (ref 24–37)

## 2014-02-04 MED ORDER — POTASSIUM CHLORIDE 10 MEQ/50ML IV SOLN
INTRAVENOUS | Status: AC
  Administered 2014-02-04: 10 meq
  Filled 2014-02-04: qty 150

## 2014-02-04 MED ORDER — POTASSIUM CHLORIDE 10 MEQ/50ML IV SOLN
10.0000 meq | INTRAVENOUS | Status: AC
  Administered 2014-02-04 (×3): 10 meq via INTRAVENOUS

## 2014-02-04 MED ORDER — POTASSIUM CHLORIDE 10 MEQ/50ML IV SOLN
10.0000 meq | INTRAVENOUS | Status: AC | PRN
  Administered 2014-02-04 (×3): 10 meq via INTRAVENOUS

## 2014-02-04 NOTE — Progress Notes (Addendum)
[address]       [place] [address]             [phone]        CARDIOTHORACIC SURGERY PROGRESS NOTE   R10 Days Post-Op Procedure(s) (LRB): CORONARY ARTERY BYPASS GRAFTING TIMES FOUR USING LEFT INTERNAL MAMMARY ARTERY TO LAD, SAPHENOUS VEIN GRAFTS TO OM1, OM2, AND PDA (N/A) MITRAL VALVE (MV) REPLACEMENT (N/A)  Subjective: arousable on vent.  Looks comfortable.  Follows simple commands  Objective: Vital signs: BP Readings from Last 1 Encounters:  02/04/14 102/54   Pulse Readings from Last 1 Encounters:  02/04/14 60   Resp Readings from Last 1 Encounters:  02/04/14 18   Temp Readings from Last 1 Encounters:  02/04/14 98.8 F (37.1 C)     Hemodynamics: PAP: (35-61)/(16-45) 47/30 mmHg CVP:  [9 mmHg-34 mmHg] 15 mmHg CO:  [3.3 L/min-3.9 L/min] 3.3 L/min CI:  [1.7 L/min/m2-2 L/min/m2] 1.7 L/min/m2  Physical Exam:  Rhythm:   Junctional - AV pacing  Breath sounds: Fairly clear  Heart sounds:  RRR w/out murmur  Incisions:  Clean and dry  Abdomen:  Soft, tolerating feeds  Extremities:  Warm, well-perfused, + edema   Intake/Output from previous day: 07/21 0701 - 07/22 0700 In: 3416.8 [I.V.:1546.8; NG/GT:1570; IV Piggyback:300] Out: 5030 [Urine:4880; Emesis/NG output:150] Intake/Output this shift:    Lab Results:  CBC: Recent Labs  02/03/14 0341 02/04/14 0027 02/04/14 0355  WBC 11.8*  --  11.0*  HGB 8.6* 10.2* 8.8*  HCT 25.6* 30.0* 26.5*  PLT 222  --  236    BMET:  Recent Labs  02/03/14 0341 02/04/14 0027 02/04/14 0355  NA 131* 130* 131*  K 3.5* 3.8 3.3*  CL 87* 86* 88*  CO2 29  --  32  GLUCOSE 196* 158* 137*  BUN 35* 39* 44*  CREATININE 1.10 1.40* 1.20*  CALCIUM 8.2*  --  8.2*     CBG (last 3)   Recent Labs  02/03/14 2342 02/04/14 0328 02/04/14 0746  GLUCAP 144* 106* 167*    ABG    Component Value Date/Time   PHART 7.552* 02/04/2014 0358   PCO2ART 41.0 02/04/2014 0358   PO2ART 58.0* 02/04/2014 0358   HCO3  36.0* 02/04/2014 0358   TCO2 37 02/04/2014 0358   ACIDBASEDEF 0.1 01/30/2014 0339   O2SAT 52.0 02/04/2014 0500    CXR: PORTABLE CHEST - 1 VIEW  COMPARISON: Portable chest x-ray of February 03, 2014  FINDINGS:  Positioning is limited on today's study. The pulmonary interstitial  markings remain increased. The right hemidiaphragm remains obscured.  The left hemidiaphragm is excluded from the image. The cardiac  silhouette is mildly enlarged and the pulmonary vascularity remains  engorged.  The endotracheal tube tip lies 4.7 cm above the crotch of the  carina. The Swan-Ganz catheter tip is in the proximal right main  pulmonary artery. The intra-aortic balloon pump marker lies just  below the aortic knob. Faintly demonstrated is a mediastinal drain  on the left. The esophagogastric tube tip projects off the image.  IMPRESSION:  Stable appearance of the lungs consistent with interstitial edema,  bibasilar atelectasis, and bilateral pleural effusions. The support  tubes and lines are in appropriate position.  Electronically Signed  By: David [place]  On: 02/04/2014 07:56    Assessment/Plan: S/P Procedure(s) (LRB): CORONARY ARTERY BYPASS GRAFTING TIMES FOUR USING LEFT INTERNAL MAMMARY ARTERY TO LAD, SAPHENOUS VEIN GRAFTS TO OM1, OM2, AND PDA (N/A) MITRAL VALVE (MV) REPLACEMENT (  N/A)  Overall reasonably stable from hemodynamic standpoint now off Epi on milrinone @ 0.2 and dopamine @ 5 - coox up to 52% Junctional rhythm, AV pacing Expected post op volume excess, diuresing well on insulin drip Expected post op acute blood loss anemia, stable VDRF stable on vent w/ FiO2 40%, CXR stable w/ bibasilar opacity c/w atelectasis + effusions - possibly near a point where she might tolerate vent wean and trial extubation - plan per Pulm/CCM Thrombocytopenia has completely resolved and HIT studies all negative - 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

## 2014-02-04 NOTE — Progress Notes (Signed)
Patient ID: [name], female   DOB: [birth year], 73 y.o.   MRN: [id number]  SICU Evening Rounds:  Hemodynamically stable on 3 mcg dopamine, milrinone 0.2 mcg.  Weaning on vent  Has not required sedation in 2 days.  She continues to diurese.

## 2014-02-04 NOTE — Progress Notes (Signed)
ANTICOAGULATION CONSULT NOTE - Follow Up Consult  Pharmacy Consult for Bivalirudin Indication: MVR  No Known Allergies  Patient Measurements: Height: 5\' 7"  (170.2 cm) Weight: 238 lb 8.6 oz (108.2 kg) IBW/kg (Calculated) : 61.6 Bivalirudin Dosing Weight: 107.4 kg  Vital Signs: Temp: 98.8 F (37.1 C) (07/22 0700) Temp src: Core (Comment) (07/22 0400) BP: 102/54 mmHg (07/22 0700) Pulse Rate: 60 (07/22 0700)  Labs:  Recent Labs  02/02/14 0406 02/03/14 0341 02/03/14 1105 02/03/14 1455 02/04/14 0027 02/04/14 0355  HGB 8.6* 8.6*  --   --  10.2* 8.8*  HCT 25.3* 25.6*  --   --  30.0* 26.5*  PLT 218 222  --   --   --  236  APTT 60* 46* 53* 62*  --  60*  CREATININE 1.13* 1.10  --   --  1.40* 1.20*    Estimated Creatinine Clearance: 52.1 ml/min (by C-G formula based on Cr of 1.2).  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 APTT remains therapeutic at 60. CBC is stable and no bleeding noted. HIT panel negative 7/15.   Goal of Therapy:  aPTT 50-90 seconds Monitor platelets by anticoagulation protocol: Yes   Plan:  1. Continue bivalirudin at 0.048mg /kg/hr 2. Continue daily aPTT and CBC 3. F/u plans for oral anticoagulation  Lysle Pearl, PharmD, BCPS Pager # [phone] 02/04/2014 8:17 AM

## 2014-02-04 NOTE — Progress Notes (Signed)
Patient weaned for 10 hours on PS 5-10. Will repeat wean in the AM.

## 2014-02-04 NOTE — Progress Notes (Signed)
Respiratory therapy note-Patient passed SBT but very weak, minimal secretions.

## 2014-02-04 NOTE — Consult Note (Signed)
PULMONARY / CRITICAL CARE MEDICINE   Name: [name] MRN: [id number] DOB: [birth year]    ADMISSION DATE:  01/25/2014 CONSULTATION DATE:  02/04/2014  REFERRING MD :  Tyrone Sage  CHIEF COMPLAINT:  CVTS  INITIAL PRESENTATION: 73 y.o. F who was brought to ED on 7/12 as code STEMI.  Taken to cath lab but unable to perform PCI due to significant RCA disease.  Taken to OR emergently for CABG x 4.  Returned to SICU on vent.  Has been on pressors for on going shock (dopamine, Epi, Milrinone).  PCCM was consulted.  STUDIES:  Echo 7/20 >>>  EF 55%, no RMA, grade 3 diastolic dysfunction, PA pressure 41.  SIGNIFICANT EVENTS: Cath 7/12 >>> occlusion of RCA not approachable with PCI Balloon Pump 7/12 >>> 7/17 CABG x 4 and MVR 7/13   HISTORY OF PRESENT ILLNESS:  Pt is encephalopathic; therefore, this HPI is obtained from chart review. [name] is a 73 y.o. F with PMH as outlined below.  She presented to OSH on 7/12 with chest pain.  In ED, found to have inferior STEMI and subsequently transferred to [facility] cath lab for PCI.  During cath, it was determined that PCI would not be feasible due to significant RCA disease.  Decision was made to take pt to OR emergently for CABG. Following surgery, pt remained in cardiogenic shock requiring multiple pressors (Dopamine, Milrinone, Epi).  PCCM was asked to consult for assistance in vent management / weaning.  PAST MEDICAL HISTORY :  Past Medical History  Diagnosis Date  . History of MI (myocardial infarction)     PCI done @ [facility] (cannot get cath report on Care Everywhere(  . Essential hypertension   . TIA (transient ischemic attack)   . Thyroid disease   . Anxiety   . Diabetes mellitus type 2 with peripheral artery disease     On insulin  . PAD (peripheral artery disease)   . Hyperlipidemia associated with type 2 diabetes mellitus    Past Surgical History  Procedure Laterality Date  . Cardiac catheterization      PCI - unknown vessel or stent;  unknown date; @ [facility].  . Abdominal hysterectomy    . Cholecystectomy    . Coronary artery bypass graft N/A 01/25/2014    Procedure: CORONARY ARTERY BYPASS GRAFTING TIMES FOUR USING LEFT INTERNAL MAMMARY ARTERY TO LAD, SAPHENOUS VEIN GRAFTS TO OM1, OM2, AND PDA;  Surgeon: Kerin Perna, MD;  Location: [facility] OR;  Service: Open Heart Surgery;  Laterality: N/A;  . Mitral valve replacement N/A 01/25/2014    Procedure: MITRAL VALVE (MV) REPLACEMENT;  Surgeon: Kerin Perna, MD;  Location: [facility] OR;  Service: Open Heart Surgery;  Laterality: N/A;   Prior to Admission medications   Medication Sig Start Date End Date Taking? Authorizing Provider  ALPRAZolam Prudy Feeler) 1 MG tablet Take 1 mg by mouth 4 (four) times daily.   Yes Historical Provider, MD  amitriptyline (ELAVIL) 25 MG tablet Take 25 mg by mouth at bedtime.   Yes Historical Provider, MD  amLODipine (NORVASC) 5 MG tablet Take 5 mg by mouth daily.   Yes Historical Provider, MD  aspirin EC 81 MG tablet Take 81 mg by mouth daily.   Yes Historical Provider, MD  hydrochlorothiazide (MICROZIDE) 12.5 MG capsule Take 12.5 mg by mouth daily.   Yes Historical Provider, MD  HYDROcodone-acetaminophen (NORCO) 10-325 MG per tablet Take 1 tablet by mouth every 6 (six) hours as needed. Pain   Yes  Historical Provider, MD  insulin glargine (LANTUS) 100 UNIT/ML injection Inject 60 Units into the skin at bedtime.   Yes Historical Provider, MD  levothyroxine (SYNTHROID, LEVOTHROID) 75 MCG tablet Take 75 mcg by mouth daily before breakfast.   Yes Historical Provider, MD  lovastatin (MEVACOR) 40 MG tablet Take 40 mg by mouth at bedtime.   Yes Historical Provider, MD  metoprolol succinate (TOPROL-XL) 50 MG 24 hr tablet Take 50 mg by mouth daily. Take with or immediately following a meal.   Yes Historical Provider, MD  omeprazole (PRILOSEC) 20 MG capsule Take 20 mg by mouth daily.   Yes Historical Provider, MD  quinapril (ACCUPRIL) 40 MG tablet Take 40 mg by mouth  daily.   Yes Historical Provider, MD   No Known Allergies  FAMILY HISTORY:  Family History  Problem Relation Age of Onset  . Heart attack Mother   . Heart disease Mother   . Heart attack Father   . Heart disease Father    SOCIAL HISTORY:  reports that she has never smoked. She has never used smokeless tobacco. She reports that she does not drink alcohol or use illicit drugs.  REVIEW OF SYSTEMS:  Unable to complete as pt is intubated.  SUBJECTIVE:   VITAL SIGNS: Temp:  [98.1 F (36.7 C)-99.3 F (37.4 C)] 98.8 F (37.1 C) (07/22 0700) Pulse Rate:  [47-80] 60 (07/22 0700) Resp:  [16-33] 18 (07/22 0700) BP: (57-143)/(43-82) 102/54 mmHg (07/22 0700) SpO2:  [95 %-100 %] 99 % (07/22 0700) Arterial Line BP: (81-189)/(44-186) 189/186 mmHg (07/21 1730) FiO2 (%):  [40 %] 40 % (07/22 0813) Weight:  [108.2 kg (238 lb 8.6 oz)] 108.2 kg (238 lb 8.6 oz) (07/22 0515) HEMODYNAMICS: PAP: (35-61)/(16-45) 47/30 mmHg CVP:  [9 mmHg-34 mmHg] 15 mmHg CO:  [3.3 L/min-3.9 L/min] 3.3 L/min CI:  [1.7 L/min/m2-2 L/min/m2] 1.7 L/min/m2 VENTILATOR SETTINGS: Vent Mode:  [-] PSV;CPAP FiO2 (%):  [40 %] 40 % Set Rate:  [15 bmp] 15 bmp Vt Set:  [500 mL] 500 mL PEEP:  [5 cmH20] 5 cmH20 Pressure Support:  [5 cmH20-8 cmH20] 5 cmH20 Plateau Pressure:  [19 cmH20-20 cmH20] 20 cmH20 INTAKE / OUTPUT: Intake/Output     07/21 0701 - 07/22 0700 07/22 0701 - 07/23 0700   I.V. (mL/kg) 1546.8 (14.3)    NG/GT 1570    IV Piggyback 300    Total Intake(mL/kg) 3416.8 (31.6)    Urine (mL/kg/hr) 4880 (1.9)    Emesis/NG output 150 (0.1)    Total Output 5030     Net -1613.2          Stool Occurrence 1 x      PHYSICAL EXAMINATION: General: WDWN female, in NAD. Neuro: Nods head in response to questions, follows commands. HEENT: [place]/AT. PERRL, sclerae anicteric. Cardiovascular: RRR, SEM. Lungs: Respirations even and unlabored.  CTA bilaterally, No W/R/R.  On vent. Abdomen: BS x 4, soft, NT/ND.  Musculoskeletal: No  gross deformities, 3+ edema. Skin: Intact, warm, no rashes.  LABS:  CBC  Recent Labs Lab 02/02/14 0406 02/03/14 0341 02/04/14 0027 02/04/14 0355  WBC 11.6* 11.8*  --  11.0*  HGB 8.6* 8.6* 10.2* 8.8*  HCT 25.3* 25.6* 30.0* 26.5*  PLT 218 222  --  236   Coag's  Recent Labs Lab 02/03/14 1105 02/03/14 1455 02/04/14 0355  APTT 53* 62* 60*   BMET  Recent Labs Lab 02/02/14 0406 02/03/14 0341 02/04/14 0027 02/04/14 0355  NA 133* 131* 130* 131*  K 3.6* 3.5* 3.8 3.3*  CL 93* 87* 86* 88*  CO2 25 29  --  32  BUN 29* 35* 39* 44*  CREATININE 1.13* 1.10 1.40* 1.20*  GLUCOSE 194* 196* 158* 137*   Electrolytes  Recent Labs Lab 02/02/14 0406 02/03/14 0341 02/04/14 0355  CALCIUM 8.1* 8.2* 8.2*  MG  --   --  1.5   Sepsis Markers No results found for this basename: LATICACIDVEN, PROCALCITON, O2SATVEN,  in the last 168 hours ABG  Recent Labs Lab 02/02/14 0416 02/03/14 0451 02/04/14 0358  PHART 7.560* 7.564* 7.552*  PCO2ART 35.1 37.3 41.0  PO2ART 68.0* 60.0* 58.0*   Liver Enzymes  Recent Labs Lab 02/02/14 0406 02/03/14 0341 02/04/14 0355  AST 35 35 38*  ALT 58* 47* 40*  ALKPHOS 205* 194* 169*  BILITOT 1.0 1.2 1.2  ALBUMIN 2.3* 2.3* 2.2*   Cardiac Enzymes No results found for this basename: TROPONINI, PROBNP,  in the last 168 hours Glucose  Recent Labs Lab 02/03/14 1213 02/03/14 1722 02/03/14 1933 02/03/14 2342 02/04/14 0328 02/04/14 0746  GLUCAP 191* 189* 161* 144* 106* 167*    Imaging Dg Chest Port 1 View  02/04/2014   CLINICAL DATA:  Status post CABG  EXAM: PORTABLE CHEST - 1 VIEW  COMPARISON:  Portable chest x-ray of February 03, 2014  FINDINGS: Positioning is limited on today's study. The pulmonary interstitial markings remain increased. The right hemidiaphragm remains obscured. The left hemidiaphragm is excluded from the image. The cardiac silhouette is mildly enlarged and the pulmonary vascularity remains engorged.  The endotracheal tube tip  lies 4.7 cm above the crotch of the carina. The Swan-Ganz catheter tip is in the proximal right main pulmonary artery. The intra-aortic balloon pump marker lies just below the aortic knob. Faintly demonstrated is a mediastinal drain on the left. The esophagogastric tube tip projects off the image.  IMPRESSION: Stable appearance of the lungs consistent with interstitial edema, bibasilar atelectasis, and bilateral pleural effusions. The support tubes and lines are in appropriate position.   Electronically Signed   By: David  [place]   On: 02/04/2014 07:56   Dg Chest Port 1 View  02/03/2014   CLINICAL DATA:  Status post CABG and mitral valve replacement  EXAM: PORTABLE CHEST - 1 VIEW  COMPARISON:  Portable chest x-ray of February 02, 2014  FINDINGS: The cardiopericardial silhouette is mildly enlarged though stable. The mitral valve ring is visible. The pulmonary vascularity is more prominent today. The interstitial markings are slightly more prominent as in both lungs. The hemidiaphragms remain obscured.  The endotracheal tube tip lies approximately 4 cm above the crotch of the carina. The esophagogastric tube tip projects off the inferior margin of the image. The Swan-Ganz type catheter tip lies in the proximal to midportion of the right main pulmonary artery. The PICC line on the right has its tip in the midportion of the SVC.  IMPRESSION: There has been slight interval increase in the pulmonary interstitial edema. Small bilateral pleural effusions persist.   Electronically Signed   By: David  [place]   On: 02/03/2014 07:36     ASSESSMENT / PLAN:  PULMONARY OETT 7/13 A: VDRF - s/p CABG Pulmonary HTN - PA pressures 41 on echo 7/20 P:   - Begin PS trials. - Diureses as below. - Repeat SBT in AM in hopes of extubation in next day or two. - ABG and CXR in AM.  CARDIOVASCULAR CVL R IJ 7/12 A:  S/p CABG x 4 S/p MVR Cardiogenic Shock Volume overload P:  -  Post op care / pressor weaning / lasix drip per  CVTS / Cardiology. - D/Ced epi drip today. - Dopamine at 5, milrinone at 0.2 - CVP 15, will continue diureses  RENAL A:   Volume overload Hyponatremia Hypokalemia AKI P:   - Lasix drip increased to 10 mg/hr. - Zaroxolyn 10 mg PO. - Potassium repleted by primary. - BMP in AM.  GASTROINTESTINAL A:   Nutrition P:   - Continue TF's. - SUP:  Pantoprazole.  HEMATOLOGIC A:   VTE Prophylaxis Post op anemia Post op thrombocytopenia - HIT panel negative. P:  - SCD's / Bivalirudin. - CBC in AM.  INFECTIOUS A:   Leukocytosis Concern for PNA P:   - Abx: Fortaz, start date 7/15, day 8/x. - Will likely D/C in AM if WBC stabilizes.  ENDOCRINE A:   DM P:   - CBG's q4hr. - SSI.  NEUROLOGIC A:   Acute Encephalopathy P:   - RASS goal: 0. - Versed / Morphine PRN.  Rutherford Guys, [place] [facility] [place] [facility] Pgr: [phone]  or [phone] 02/04/2014, 10:32 AM  Above note edited in full, reflects my plans.  I have personally obtained a history, examined the patient, evaluated laboratory and imaging results, formulated the assessment and plan and placed orders.  CRITICAL CARE: The patient is critically ill with multiple organ systems failure and requires high complexity decision making for assessment and support, frequent evaluation and titration of therapies, application of advanced monitoring technologies and extensive interpretation of multiple databases. Critical Care Time devoted to patient care services described in this note is 45 minutes.   Alyson Reedy, M.D. [facility] Pulmonary/Critical Care Medicine. Pager: [phone]. After hours pager: [phone].

## 2014-02-04 NOTE — Progress Notes (Signed)
Patient improving but remains on vent, will ask ccm to assist with vent wean and critical care issuses.  Delight Ovens MD      [address] [place] [place] [address] Office [phone]   Beeper [phone]  02/04/2014 10:03 AM

## 2014-02-05 ENCOUNTER — Inpatient Hospital Stay (HOSPITAL_COMMUNITY): Payer: Medicare Other

## 2014-02-05 DIAGNOSIS — Z951 Presence of aortocoronary bypass graft: Secondary | ICD-10-CM

## 2014-02-05 LAB — GLUCOSE, CAPILLARY
Glucose-Capillary: 149 mg/dL — ABNORMAL HIGH (ref 70–99)
Glucose-Capillary: 149 mg/dL — ABNORMAL HIGH (ref 70–99)
Glucose-Capillary: 153 mg/dL — ABNORMAL HIGH (ref 70–99)
Glucose-Capillary: 164 mg/dL — ABNORMAL HIGH (ref 70–99)
Glucose-Capillary: 170 mg/dL — ABNORMAL HIGH (ref 70–99)
Glucose-Capillary: 179 mg/dL — ABNORMAL HIGH (ref 70–99)

## 2014-02-05 LAB — CBC
HCT: 24.3 % — ABNORMAL LOW (ref 36.0–46.0)
Hemoglobin: 8 g/dL — ABNORMAL LOW (ref 12.0–15.0)
MCH: 30.1 pg (ref 26.0–34.0)
MCHC: 32.9 g/dL (ref 30.0–36.0)
MCV: 91.4 fL (ref 78.0–100.0)
Platelets: 266 10*3/uL (ref 150–400)
RBC: 2.66 MIL/uL — ABNORMAL LOW (ref 3.87–5.11)
RDW: 17.9 % — ABNORMAL HIGH (ref 11.5–15.5)
WBC: 13.7 10*3/uL — ABNORMAL HIGH (ref 4.0–10.5)

## 2014-02-05 LAB — BLOOD GAS, ARTERIAL
ACID-BASE EXCESS: 11.3 mmol/L — AB (ref 0.0–2.0)
Bicarbonate: 35.1 mEq/L — ABNORMAL HIGH (ref 20.0–24.0)
DRAWN BY: 10006
FIO2: 0.4 %
LHR: 15 {breaths}/min
O2 SAT: 96.2 %
PCO2 ART: 41.6 mmHg (ref 35.0–45.0)
PEEP/CPAP: 5 cmH2O
Patient temperature: 97.5
TCO2: 36.4 mmol/L (ref 0–100)
VT: 500 mL
pH, Arterial: 7.532 — ABNORMAL HIGH (ref 7.350–7.450)
pO2, Arterial: 75.7 mmHg — ABNORMAL LOW (ref 80.0–100.0)

## 2014-02-05 LAB — COMPREHENSIVE METABOLIC PANEL
ALT: 32 U/L (ref 0–35)
AST: 32 U/L (ref 0–37)
Albumin: 2.1 g/dL — ABNORMAL LOW (ref 3.5–5.2)
Alkaline Phosphatase: 155 U/L — ABNORMAL HIGH (ref 39–117)
Anion gap: 11 (ref 5–15)
BUN: 56 mg/dL — ABNORMAL HIGH (ref 6–23)
CO2: 34 mEq/L — ABNORMAL HIGH (ref 19–32)
Calcium: 8 mg/dL — ABNORMAL LOW (ref 8.4–10.5)
Chloride: 87 mEq/L — ABNORMAL LOW (ref 96–112)
Creatinine, Ser: 1.26 mg/dL — ABNORMAL HIGH (ref 0.50–1.10)
GFR calc Af Amer: 47 mL/min — ABNORMAL LOW (ref >90)
GFR calc non Af Amer: 41 mL/min — ABNORMAL LOW (ref >90)
Glucose, Bld: 167 mg/dL — ABNORMAL HIGH (ref 70–99)
Potassium: 3.3 mEq/L — ABNORMAL LOW (ref 3.7–5.3)
Sodium: 132 mEq/L — ABNORMAL LOW (ref 137–147)
Total Bilirubin: 1.2 mg/dL (ref 0.3–1.2)
Total Protein: 6.3 g/dL (ref 6.0–8.3)

## 2014-02-05 LAB — CARBOXYHEMOGLOBIN
Carboxyhemoglobin: 1.6 % — ABNORMAL HIGH (ref 0.5–1.5)
Methemoglobin: 0.9 % (ref 0.0–1.5)
O2 Saturation: 52 %
Total hemoglobin: 8.1 g/dL — ABNORMAL LOW (ref 12.0–16.0)

## 2014-02-05 LAB — PHOSPHORUS: Phosphorus: 3.1 mg/dL (ref 2.3–4.6)

## 2014-02-05 LAB — MAGNESIUM: Magnesium: 1.6 mg/dL (ref 1.5–2.5)

## 2014-02-05 LAB — APTT: aPTT: 59 seconds — ABNORMAL HIGH (ref 24–37)

## 2014-02-05 MED ORDER — ALBUMIN HUMAN 25 % IV SOLN
25.0000 g | Freq: Four times a day (QID) | INTRAVENOUS | Status: AC
  Administered 2014-02-05 – 2014-02-06 (×4): 25 g via INTRAVENOUS
  Filled 2014-02-05 (×4): qty 100

## 2014-02-05 MED ORDER — ACETAZOLAMIDE SODIUM 500 MG IJ SOLR
250.0000 mg | Freq: Three times a day (TID) | INTRAMUSCULAR | Status: AC
  Administered 2014-02-05 (×2): 250 mg via INTRAVENOUS
  Filled 2014-02-05 (×2): qty 500

## 2014-02-05 MED ORDER — POTASSIUM CHLORIDE 10 MEQ/50ML IV SOLN
10.0000 meq | INTRAVENOUS | Status: AC
  Administered 2014-02-05 (×3): 10 meq via INTRAVENOUS
  Filled 2014-02-05: qty 50

## 2014-02-05 MED ORDER — ALBUMIN HUMAN 5 % IV SOLN
INTRAVENOUS | Status: AC
  Filled 2014-02-05: qty 250

## 2014-02-05 MED ORDER — DEXTROSE 5 % IV SOLN
10.0000 mg/h | INTRAVENOUS | Status: AC
  Administered 2014-02-06: 10 mg/h via INTRAVENOUS
  Filled 2014-02-05 (×2): qty 25

## 2014-02-05 MED ORDER — PRO-STAT SUGAR FREE PO LIQD
30.0000 mL | Freq: Two times a day (BID) | ORAL | Status: DC
  Administered 2014-02-05: 30 mL
  Administered 2014-02-06: 09:00:00
  Administered 2014-02-06 – 2014-02-07 (×2): 30 mL
  Filled 2014-02-05 (×5): qty 30

## 2014-02-05 NOTE — Progress Notes (Signed)
PULMONARY / CRITICAL CARE MEDICINE   Name: [name] MRN: [id number] DOB: 1940-12-06    ADMISSION DATE:  01/25/2014 CONSULTATION DATE:  02/05/2014  REFERRING MD :  Tyrone SageGerhardt  CHIEF COMPLAINT:  CVTS  INITIAL PRESENTATION: 73 y.o. F who was brought to ED on 7/12 as code STEMI.  Taken to cath lab but unable to perform PCI due to significant RCA disease.  Taken to OR emergently for CABG x 4.  Returned to SICU on vent.  Has been on pressors for on going shock (dopamine, Epi, Milrinone).  PCCM was consulted.  STUDIES:  Echo 7/20 >>>  EF 55%, no RMA, grade 3 diastolic dysfunction, PA pressure 41.  SIGNIFICANT EVENTS: Cath 7/12 >>> occlusion of RCA not approachable with PCI Balloon Pump 7/12 >>> 7/17 CABG x 4 and MVR 7/13   HISTORY OF PRESENT ILLNESS:  Pt is encephalopathic; therefore, this HPI is obtained from chart review. [name] is a 73 y.o. F with PMH as outlined below.  She presented to OSH on 7/12 with chest pain.  In ED, found to have inferior STEMI and [facility].  During cath, it was determined that PCI would not be feasible due to significant RCA disease.  Decision was made to take pt to OR emergently for CABG. Following surgery, pt remained in cardiogenic shock requiring multiple pressors (Dopamine, Milrinone, Epi).  PCCM was asked to consult for assistance in vent management / weaning.  PAST MEDICAL HISTORY :  Past Medical History  Diagnosis Date  . History of MI (myocardial infarction)     [facility] (cannot get cath report on Care Everywhere(  . Essential hypertension   . TIA (transient ischemic attack)   . Thyroid disease   . Anxiety   . Diabetes mellitus type 2 with peripheral artery disease     On insulin  . PAD (peripheral artery disease)   . Hyperlipidemia associated with type 2 diabetes mellitus    Past Surgical History  Procedure Laterality Date  . Cardiac catheterization      PCI - unknown vessel or stent;  unknown date; [facility].  . Abdominal hysterectomy    . Cholecystectomy    . Coronary artery bypass graft N/A 01/25/2014    Procedure: CORONARY ARTERY BYPASS GRAFTING TIMES FOUR USING LEFT INTERNAL MAMMARY ARTERY TO LAD, SAPHENOUS VEIN GRAFTS TO OM1, OM2, AND PDA;  Surgeon: Kerin PernaPeter Van Trigt, MD;  [facility];  Service: Open Heart Surgery;  Laterality: N/A;  . Mitral valve replacement N/A 01/25/2014    Procedure: MITRAL VALVE (MV) REPLACEMENT;  Surgeon: Kerin PernaPeter Van Trigt, MD;  [facility];  Service: Open Heart Surgery;  Laterality: N/A;   Prior to Admission medications   Medication Sig Start Date End Date Taking? Authorizing Provider  ALPRAZolam Prudy Feeler(XANAX) 1 MG tablet Take 1 mg by mouth 4 (four) times daily.   Yes Historical Provider, MD  amitriptyline (ELAVIL) 25 MG tablet Take 25 mg by mouth at bedtime.   Yes Historical Provider, MD  amLODipine (NORVASC) 5 MG tablet Take 5 mg by mouth daily.   Yes Historical Provider, MD  aspirin EC 81 MG tablet Take 81 mg by mouth daily.   Yes Historical Provider, MD  hydrochlorothiazide (MICROZIDE) 12.5 MG capsule Take 12.5 mg by mouth daily.   Yes Historical Provider, MD  HYDROcodone-acetaminophen (NORCO) 10-325 MG per tablet Take 1 tablet by mouth every 6 (six) hours as needed. Pain   Yes  Historical Provider, MD  insulin glargine (LANTUS) 100 UNIT/ML injection Inject 60 Units into the skin at bedtime.   Yes Historical Provider, MD  levothyroxine (SYNTHROID, LEVOTHROID) 75 MCG tablet Take 75 mcg by mouth daily before breakfast.   Yes Historical Provider, MD  lovastatin (MEVACOR) 40 MG tablet Take 40 mg by mouth at bedtime.   Yes Historical Provider, MD  metoprolol succinate (TOPROL-XL) 50 MG 24 hr tablet Take 50 mg by mouth daily. Take with or immediately following a meal.   Yes Historical Provider, MD  omeprazole (PRILOSEC) 20 MG capsule Take 20 mg by mouth daily.   Yes Historical Provider, MD  quinapril (ACCUPRIL) 40 MG tablet Take 40 mg by mouth  daily.   Yes Historical Provider, MD   No Known Allergies  FAMILY HISTORY:  Family History  Problem Relation Age of Onset  . Heart attack Mother   . Heart disease Mother   . Heart attack Father   . Heart disease Father    SOCIAL HISTORY:  reports that she has never smoked. She has never used smokeless tobacco. She reports that she does not drink alcohol or use illicit drugs.  REVIEW OF SYSTEMS:  Unable to complete as pt is intubated.  SUBJECTIVE:   VITAL SIGNS: Temp:  [97.5 F (36.4 C)-98 F (36.7 C)] 97.8 F (36.6 C) (07/23 0830) Pulse Rate:  [68-95] 80 (07/23 0900) Resp:  [13-42] 15 (07/23 0900) BP: (77-141)/(41-79) 97/51 mmHg (07/23 0900) SpO2:  [96 %-100 %] 100 % (07/23 0900) FiO2 (%):  [40 %] 40 % (07/23 0800) Weight:  [237 lb 11.2 oz (107.82 kg)] 237 lb 11.2 oz (107.82 kg) (07/23 0500)  HEMODYNAMICS: CVP:  [10 mmHg-18 mmHg] 10 mmHg  VENTILATOR SETTINGS: Vent Mode:  [-] PRVC FiO2 (%):  [40 %] 40 % Set Rate:  [12 bmp-15 bmp] 15 bmp Vt Set:  [500 mL] 500 mL PEEP:  [5 cmH20] 5 cmH20 Pressure Support:  [10 cmH20] 10 cmH20 Plateau Pressure:  [18 cmH20-20 cmH20] 19 cmH20  INTAKE / OUTPUT: Intake/Output     07/22 0701 - 07/23 0700 07/23 0701 - 07/24 0700   I.V. (mL/kg) 967.6 (9) 39.8 (0.4)   NG/GT 1695 110   IV Piggyback 200 100   Total Intake(mL/kg) 2862.6 (26.6) 249.8 (2.3)   Urine (mL/kg/hr) 4170 (1.6) 425 (1.8)   Emesis/NG output     Stool 3 (0)    Total Output 4173 425   Net -1310.4 -175.2         PHYSICAL EXAMINATION: General: WDWN female, in NAD. Neuro: Nods head in response to questions, follows commands. HEENT: [place]/AT. PERRL, sclerae anicteric. Cardiovascular: RRR, SEM. Lungs: Respirations even and unlabored.  CTA bilaterally, No W/R/R.  On vent. Abdomen: BS x 4, soft, NT/ND.  Musculoskeletal: No gross deformities, 3+ edema. Skin: Intact, warm, no rashes.  LABS:  CBC  Recent Labs Lab 02/03/14 0341 02/04/14 0027 02/04/14 0355  02/05/14 0430  WBC 11.8*  --  11.0* 13.7*  HGB 8.6* 10.2* 8.8* 8.0*  HCT 25.6* 30.0* 26.5* 24.3*  PLT 222  --  236 266   Coag's  Recent Labs Lab 02/03/14 1455 02/04/14 0355 02/05/14 0430  APTT 62* 60* 59*   BMET  Recent Labs Lab 02/03/14 0341 02/04/14 0027 02/04/14 0355 02/05/14 0430  NA 131* 130* 131* 132*  K 3.5* 3.8 3.3* 3.3*  CL 87* 86* 88* 87*  CO2 29  --  32 34*  BUN 35* 39* 44* 56*  CREATININE 1.10 1.40* 1.20*  1.26*  GLUCOSE 196* 158* 137* 167*   Electrolytes  Recent Labs Lab 02/03/14 0341 02/04/14 0355 02/05/14 0430  CALCIUM 8.2* 8.2* 8.0*  MG  --  1.5 1.6  PHOS  --   --  3.1   Sepsis Markers No results found for this basename: LATICACIDVEN, PROCALCITON, O2SATVEN,  in the last 168 hours ABG  Recent Labs Lab 02/03/14 0451 02/04/14 0358 02/05/14 0505  PHART 7.564* 7.552* 7.532*  PCO2ART 37.3 41.0 41.6  PO2ART 60.0* 58.0* 75.7*   Liver Enzymes  Recent Labs Lab 02/03/14 0341 02/04/14 0355 02/05/14 0430  AST 35 38* 32  ALT 47* 40* 32  ALKPHOS 194* 169* 155*  BILITOT 1.2 1.2 1.2  ALBUMIN 2.3* 2.2* 2.1*   Cardiac Enzymes No results found for this basename: TROPONINI, PROBNP,  in the last 168 hours Glucose  Recent Labs Lab 02/04/14 1214 02/04/14 1548 02/04/14 1928 02/04/14 2340 02/05/14 0350 02/05/14 0744  GLUCAP 134* 171* 131* 160* 164* 149*    Imaging Dg Chest Port 1 View  02/05/2014   CLINICAL DATA:  Status post coronary artery bypass graft.  EXAM: PORTABLE CHEST - 1 VIEW  COMPARISON:  February 04, 2014.  FINDINGS: Stable cardiomediastinal silhouette. Endotracheal tube is noted with distal tip 5 cm above the carina. Nasogastric tube is seen entering the stomach. Status post coronary artery bypass graft. Bilateral pulmonary edema appears improved. Stable bilateral pleural effusions are noted. Swan-Ganz catheter has been removed. No pneumothorax is noted.  IMPRESSION: Endotracheal tube in grossly good position. Improved bilateral  pulmonary edema with stable bilateral pleural effusions.   Electronically Signed   By: Roque Lias M.D.   On: 02/05/2014 08:01   Dg Chest Port 1 View  02/04/2014   CLINICAL DATA:  Status post CABG  EXAM: PORTABLE CHEST - 1 VIEW  COMPARISON:  Portable chest x-ray of February 03, 2014  FINDINGS: Positioning is limited on today's study. The pulmonary interstitial markings remain increased. The right hemidiaphragm remains obscured. The left hemidiaphragm is excluded from the image. The cardiac silhouette is mildly enlarged and the pulmonary vascularity remains engorged.  The endotracheal tube tip lies 4.7 cm above the crotch of the carina. The Swan-Ganz catheter tip is in the proximal right main pulmonary artery. The intra-aortic balloon pump marker lies just below the aortic knob. Faintly demonstrated is a mediastinal drain on the left. The esophagogastric tube tip projects off the image.  IMPRESSION: Stable appearance of the lungs consistent with interstitial edema, bibasilar atelectasis, and bilateral pleural effusions. The support tubes and lines are in appropriate position.   Electronically Signed   By: David  [place]   On: 02/04/2014 07:56     ASSESSMENT / PLAN:  PULMONARY OETT 7/13 A: VDRF - s/p CABG Pulmonary HTN - PA pressures 41 on echo 7/20 P:   - Continue PS trials. - Diureses as below. - SBT in AM, will likely give a chance at extubation prior to trach. - ABG and CXR in AM.  CARDIOVASCULAR CVL R IJ 7/12 A:  S/p CABG x 4 S/p MVR Cardiogenic Shock Volume overload P:  - Post op care / pressor weaning / lasix drip per CVTS / Cardiology. - D/Ced epi drip today. - Dopamine at 3, milrinone at 0.2. - CVP 10, will continue diureses.  RENAL A:   Volume overload Hyponatremia Hypokalemia AKI P:   - Lasix drip at 10 mg/hr. - Zaroxolyn 10 mg PO. - BMP in AM. - Replace electrolytes as indicated. - Trial of albumin  today then reassess in the morning. - Acetazolamide for diuretic  induced metabolic acidosis.  GASTROINTESTINAL A:   Nutrition P:   - Continue TF's. - SUP:  Pantoprazole.  HEMATOLOGIC A:   VTE Prophylaxis Post op anemia Post op thrombocytopenia - HIT panel negative. P:  - SCD's / Bivalirudin. - CBC in AM.  INFECTIOUS A:   Leukocytosis Concern for PNA P:   - Abx: Fortaz, start date 7/15, day 9/x. - Will likely D/C in AM if WBC stabilizes.  ENDOCRINE A:   DM P:   - CBG's q4hr. - SSI.  NEUROLOGIC A:   Acute Encephalopathy P:   - RASS goal: 0. - D/C all sedation.  I have personally obtained a history, examined the patient, evaluated laboratory and imaging results, formulated the assessment and plan and placed orders.  CRITICAL CARE: The patient is critically ill with multiple organ systems failure and requires high complexity decision making for assessment and support, frequent evaluation and titration of therapies, application of advanced monitoring technologies and extensive interpretation of multiple databases. Critical Care Time devoted to patient care services described in this note is 35 minutes.   Alyson Reedy, M.D. [facility] Pulmonary/Critical Care Medicine. Pager: [phone]. After hours pager: [phone].

## 2014-02-05 NOTE — Progress Notes (Signed)
ANTICOAGULATION CONSULT NOTE - Follow Up Consult  Pharmacy Consult for Bivalirudin Indication: MVR  No Known Allergies  Patient Measurements: Height: 5\' 7"  (170.2 cm) Weight: 237 lb 11.2 oz (107.82 kg) IBW/kg (Calculated) : 61.6 Bivalirudin Dosing Weight: 107.4 kg  Vital Signs: Temp: 97.8 F (36.6 C) (07/23 0830) Temp src: Axillary (07/23 0830) BP: 97/51 mmHg (07/23 0900) Pulse Rate: 80 (07/23 0900)  Labs:  Recent Labs  02/03/14 0341  02/03/14 1455 02/04/14 0027 02/04/14 0355 02/05/14 0430  HGB 8.6*  --   --  10.2* 8.8* 8.0*  HCT 25.6*  --   --  30.0* 26.5* 24.3*  PLT 222  --   --   --  236 266  APTT 46*  < > 62*  --  60* 59*  CREATININE 1.10  --   --  1.40* 1.20* 1.26*  < > = values in this interval not displayed.  Estimated Creatinine Clearance: 49.5 ml/min (by C-G formula based on Cr of 1.26).  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 APTT remains therapeutic at 59. CBC is stable and no bleeding noted. HIT panel negative 7/15.   Goal of Therapy:  aPTT 50-90 seconds Monitor platelets by anticoagulation protocol: Yes   Plan:  1. Continue bivalirudin at 0.048mg /kg/hr 2. Continue daily aPTT and CBC 3. F/u plans for oral anticoagulation 4. Consider switching back to heparin since HIT is negative and plts have recovered  Lysle Pearl, PharmD, BCPS Pager # [phone] 02/05/2014 9:43 AM

## 2014-02-05 NOTE — Progress Notes (Signed)
NUTRITION FOLLOW UP  DOCUMENTATION CODES Per approved criteria  -Obesity Unspecified   INTERVENTION: Continue current TF regimen RD to follow for nutrition care plan  NUTRITION DIAGNOSIS: Inadequate oral intake related to inability to eat as evidenced by NPO status, ongoing  Goal: EN to provide 60-70% of estimated calorie needs (22-25 kcals/kg ideal body weight) and 100% of estimated protein needs, based on ASPEN guidelines for permissive underfeeding in critically ill obese individuals, met  Monitor:  TF regimen & tolerance, respiratory status, weight, labs, I/O's  ASSESSMENT: PMHx significant for CAD, TIA, HTN, DM2, PAD. Admitted with n/v, diaphoresis and chest pain. Work-up reveals STEMI.  Underwent L heart catheterization with coronary angiogram on 7/12. Work-up reveals severe multivessel CAD. Pt then went to OR for emergent CABG x 4 on 7/13.  Patient is currently intubated on ventilator support MV: 11.3 L/min Temp (24hrs), Avg:97.7 F (36.5 C), Min:97.5 F (36.4 C), Max:98 F (36.7 C)   Viital HP formula currently infusing at goal rate of 45 ml/hr via Panda tube with Prostat liquid protein 30 ml BID via tube providing 1280 kcals (67% of re-estimated kcal needs), 125 gm protein (100% of estimated protein needs), 903 ml of free water.    Height: Ht Readings from Last 1 Encounters:  01/31/14 5' 7"  (1.702 m)    Weight: Wt Readings from Last 1 Encounters:  02/05/14 237 lb 11.2 oz (107.82 kg)    BMI:  Body mass index is 37.22 kg/(m^2).   Re-estimated Nutritional Needs: Kcal: 1898 Protein: 125-135 gm Fluid: per MD  Skin: leg and chest incisions  Diet Order: NPO   Intake/Output Summary (Last 24 hours) at 02/05/14 1222 Last data filed at 02/05/14 1200  Gross per 24 hour  Intake 2885.5 ml  Output   4573 ml  Net -1687.5 ml    Labs:   Recent Labs Lab 02/03/14 0341 02/04/14 0027 02/04/14 0355 02/05/14 0430  NA 131* 130* 131* 132*  K 3.5* 3.8 3.3*  3.3*  CL 87* 86* 88* 87*  CO2 29  --  32 34*  BUN 35* 39* 44* 56*  CREATININE 1.10 1.40* 1.20* 1.26*  CALCIUM 8.2*  --  8.2* 8.0*  MG  --   --  1.5 1.6  PHOS  --   --   --  3.1  GLUCOSE 196* 158* 137* 167*    CBG (last 3)   Recent Labs  02/04/14 2340 02/05/14 0350 02/05/14 0744  GLUCAP 160* 164* 149*    Scheduled Meds: . acetaZOLAMIDE  250 mg Intravenous Q8H  . albumin human  25 g Intravenous Q6H  . albumin human      . amiodarone  200 mg Per NG tube BID  . antiseptic oral rinse  15 mL Mouth Rinse QID  . aspirin EC  325 mg Oral Daily   Or  . aspirin  324 mg Per Tube Daily  . atorvastatin  80 mg Oral q1800  . bisacodyl  10 mg Oral Daily   Or  . bisacodyl  10 mg Rectal Daily  . cefTAZidime (FORTAZ)  IV  1 g Intravenous 3 times per day  . chlorhexidine  15 mL Mouth Rinse BID  . docusate  200 mg Oral Daily  . feeding supplement (PRO-STAT SUGAR FREE 64)  30 mL Oral BID  . insulin aspart  0-24 Units Subcutaneous 6 times per day  . insulin glargine  20 Units Subcutaneous BID  . levothyroxine  75 mcg Oral QAC breakfast  . metolazone  10 mg Oral Daily  . pantoprazole sodium  40 mg Per Tube Daily  . potassium chloride  10 mEq Intravenous Q1 Hr x 3  . potassium chloride  40 mEq Per Tube BID  . sodium chloride  10-40 mL Intracatheter Q12H  . sodium chloride  3 mL Intravenous Q12H    Continuous Infusions: . sodium chloride 10 mL/hr at 02/03/14 1200  . sodium chloride Stopped (02/01/14 0830)  . sodium chloride 20 mL/hr at 02/01/14 1000  . sodium chloride 250 mL (02/03/14 1800)  . bivalirudin (ANGIOMAX) infusion 0.5 mg/mL (Non-ACS indications) 0.048 mg/kg/hr (02/05/14 1000)  . DOPamine 3 mcg/kg/min (02/05/14 0700)  . feeding supplement (VITAL HIGH PROTEIN) 1,000 mL (02/05/14 2992)  . furosemide (LASIX) infusion    . lactated ringers 10 mL/hr at 02/01/14 1300  . milrinone 0.2 mcg/kg/min (02/05/14 0700)    Past Medical History  Diagnosis Date  . History of MI  (myocardial infarction)     PCI done @ [facility] (cannot get cath report on Care Everywhere(  . Essential hypertension   . TIA (transient ischemic attack)   . Thyroid disease   . Anxiety   . Diabetes mellitus type 2 with peripheral artery disease     On insulin  . PAD (peripheral artery disease)   . Hyperlipidemia associated with type 2 diabetes mellitus     Past Surgical History  Procedure Laterality Date  . Cardiac catheterization      PCI - unknown vessel or stent; unknown date; @ [facility].  . Abdominal hysterectomy    . Cholecystectomy    . Coronary artery bypass graft N/A 01/25/2014    Procedure: CORONARY ARTERY BYPASS GRAFTING TIMES FOUR USING LEFT INTERNAL MAMMARY ARTERY TO LAD, SAPHENOUS VEIN GRAFTS TO OM1, OM2, AND PDA;  Surgeon: Ivin Poot, MD;  Location: [facility];  Service: Open Heart Surgery;  Laterality: N/A;  . Mitral valve replacement N/A 01/25/2014    Procedure: MITRAL VALVE (MV) REPLACEMENT;  Surgeon: Ivin Poot, MD;  Location: [place];  Service: Open Heart Surgery;  Laterality: N/A;    Arthur Holms, RD, LDN Pager #: [phone] After-Hours Pager #: [phone]

## 2014-02-05 NOTE — Progress Notes (Addendum)
TCTS DAILY ICU PROGRESS NOTE                   [address]            [place] [id number]          [phone]   11 Days Post-Op Procedure(s) (LRB): CORONARY ARTERY BYPASS GRAFTING TIMES FOUR USING LEFT INTERNAL MAMMARY ARTERY TO LAD, SAPHENOUS VEIN GRAFTS TO OM1, OM2, AND PDA (N/A) MITRAL VALVE (MV) REPLACEMENT (N/A)  Total Length of Stay:  LOS: 11 days   Subjective: Not as arousable today. Opens eyes, but not following commands. Weaned vent for 10 hrs yesterday.     Objective: Vital signs in last 24 hours: Temp:  [97.5 F (36.4 C)-98.6 F (37 C)] 97.5 F (36.4 C) (07/23 0400) Pulse Rate:  [79-95] 81 (07/23 0730) Cardiac Rhythm:  [-] A-V Sequential paced (07/23 0700) Resp:  [13-42] 20 (07/23 0730) BP: (77-141)/(41-79) 96/54 mmHg (07/23 0730) SpO2:  [96 %-100 %] 100 % (07/23 0730) FiO2 (%):  [40 %] 40 % (07/23 0730) Weight:  [237 lb 11.2 oz (107.82 kg)] 237 lb 11.2 oz (107.82 kg) (07/23 0500)  Filed Weights   02/03/14 0400 02/04/14 0515 02/05/14 0500  Weight: 243 lb 2.7 oz (110.3 kg) 238 lb 8.6 oz (108.2 kg) 237 lb 11.2 oz (107.82 kg)    Weight change: -13.4 oz (-0.38 kg)   Hemodynamic parameters for last 24 hours: PAP: (44-46)/(29-30) 46/30 mmHg CVP:  [14 mmHg-18 mmHg] 15 mmHg  Intake/Output from previous day: 07/22 0701 - 07/23 0700 In: 2862.6 [I.V.:967.6; BJ/YN:8295G/GT:1695; IV Piggyback:200] Out: 4173 [Urine:4170; Stool:3]  Intake/Output this shift:    Current Meds: Scheduled Meds: . amiodarone  200 mg Per NG tube BID  . antiseptic oral rinse  15 mL Mouth Rinse QID  . aspirin EC  325 mg Oral Daily   Or  . aspirin  324 mg Per Tube Daily  . atorvastatin  80 mg Oral q1800  . bisacodyl  10 mg Oral Daily   Or  . bisacodyl  10 mg Rectal Daily  . cefTAZidime (FORTAZ)  IV  1 g Intravenous 3 times per day  . chlorhexidine  15 mL Mouth Rinse BID  . docusate  200 mg Oral Daily  . feeding supplement (PRO-STAT SUGAR FREE 64)  30 mL Oral BID  . insulin  aspart  0-24 Units Subcutaneous 6 times per day  . insulin glargine  20 Units Subcutaneous BID  . levothyroxine  75 mcg Oral QAC breakfast  . metolazone  10 mg Oral Daily  . pantoprazole sodium  40 mg Per Tube Daily  . potassium chloride  10 mEq Intravenous Q1 Hr x 3  . potassium chloride  40 mEq Per Tube BID  . sodium chloride  10-40 mL Intracatheter Q12H  . sodium chloride  3 mL Intravenous Q12H   Continuous Infusions: . sodium chloride 10 mL/hr at 02/03/14 1200  . sodium chloride Stopped (02/01/14 0830)  . sodium chloride 20 mL/hr at 02/01/14 1000  . sodium chloride 250 mL (02/03/14 1800)  . bivalirudin (ANGIOMAX) infusion 0.5 mg/mL (Non-ACS indications) 0.048 mg/kg/hr (02/04/14 0945)  . DOPamine 3 mcg/kg/min (02/05/14 0700)  . feeding supplement (VITAL HIGH PROTEIN) 1,000 mL (02/05/14 [id number])  . furosemide (LASIX) infusion 10 mg/hr (02/05/14 0745)  . lactated ringers 10 mL/hr at 02/01/14 1300  . milrinone 0.2 mcg/kg/min (02/05/14 0700)   PRN Meds:.HYDROcodone-acetaminophen, midazolam, morphine injection, ondansetron (ZOFRAN) IV, sodium chloride, sodium chloride  Physical Exam: General appearance: Opens eyes, but not as arousable Heart: Paced at 80, brady under pacer Lungs: Generally clear, slightly decreased in bases bilaterally Abdomen: soft, non-tender; bowel sounds normal; no masses,  no organomegaly Extremities: + Edema, ecchymosis bilateral LEs Wound: Intact without erythema, some serosanguinous drainage from right leg incisions    Lab Results: CBC: Recent Labs  02/04/14 0355 02/05/14 0430  WBC 11.0* 13.7*  HGB 8.8* 8.0*  HCT 26.5* 24.3*  PLT 236 266   BMET:  Recent Labs  02/04/14 0355 02/05/14 0430  NA 131* 132*  K 3.3* 3.3*  CL 88* 87*  CO2 32 34*  GLUCOSE 137* 167*  BUN 44* 56*  CREATININE 1.20* 1.26*  CALCIUM 8.2* 8.0*    PT/INR: No results found for this basename: LABPROT, INR,  in the last 72 hours  Radiology: Dg Chest Port 1 View 02/05/2014  FINDINGS:  Stable cardiomediastinal silhouette. Endotracheal tube is noted with  distal tip 5 cm above the carina. Nasogastric tube is seen entering  the stomach. Status post coronary artery bypass graft. Bilateral  pulmonary edema appears improved. Stable bilateral pleural effusions  are noted. Swan-Ganz catheter has been removed. No pneumothorax is  noted.  IMPRESSION:  Endotracheal tube in grossly good position. Improved bilateral  pulmonary edema with stable bilateral pleural effusions.    02/04/2014   CLINICAL DATA:  Status post CABG  EXAM: PORTABLE CHEST - 1 VIEW  COMPARISON:  Portable chest x-ray of February 03, 2014  FINDINGS: Positioning is limited on today's study. The pulmonary interstitial markings remain increased. The right hemidiaphragm remains obscured. The left hemidiaphragm is excluded from the image. The cardiac silhouette is mildly enlarged and the pulmonary vascularity remains engorged.  The endotracheal tube tip lies 4.7 cm above the crotch of the carina. The Swan-Ganz catheter tip is in the proximal right main pulmonary artery. The intra-aortic balloon pump marker lies just below the aortic knob. Faintly demonstrated is a mediastinal drain on the left. The esophagogastric tube tip projects off the image.  IMPRESSION: Stable appearance of the lungs consistent with interstitial edema, bibasilar atelectasis, and bilateral pleural effusions. The support tubes and lines are in appropriate position.   Electronically Signed   By: David  [place]   On: 02/04/2014 07:56     Assessment/Plan: S/P Procedure(s) (LRB): CORONARY ARTERY BYPASS GRAFTING TIMES FOUR USING LEFT INTERNAL MAMMARY ARTERY TO LAD, SAPHENOUS VEIN GRAFTS TO OM1, OM2, AND PDA (N/A) MITRAL VALVE (MV) REPLACEMENT (N/A)  CV- , Paced at 80, BPs low normal.  Co-ox 52. On Dopamine, Milrinone.  Continue current care.   VDRF- Not as arousable today, no sedation in several days. CCM following. Continue working on slow vent wean  as able.  Vol overload- Remains edematous. Continue Lasix gtt. Nutrition- continue TFs.  Anticoagulation- Continue bivalirudin for prosthetic MV.   COLLINS,GINA H 02/05/2014 7:52 AM   I have seen and examined the patient and agree with the assessment and plan as outlined.  CXR improving.  Possibly ready for trial extubation soon.  Karanveer Ramakrishnan H 02/05/2014 8:24 AM

## 2014-02-05 NOTE — Progress Notes (Signed)
On vent  Hemodynamics stable on dopamine and milrinone  BP 130/27  Pulse 80  Temp(Src) 97.9 F (36.6 C) (Oral)  Resp 31  Ht 5\' 7"  (1.702 m)  Wt 237 lb 11.2 oz (107.82 kg)  BMI 37.22 kg/m2  SpO2 100%   Intake/Output Summary (Last 24 hours) at 02/05/14 1712 Last data filed at 02/05/14 1600  Gross per 24 hour  Intake   3146 ml  Output   4348 ml  Net  -1202 ml    Continue present care

## 2014-02-06 ENCOUNTER — Inpatient Hospital Stay (HOSPITAL_COMMUNITY): Payer: Medicare Other

## 2014-02-06 DIAGNOSIS — J90 Pleural effusion, not elsewhere classified: Secondary | ICD-10-CM | POA: Diagnosis not present

## 2014-02-06 LAB — CARBOXYHEMOGLOBIN
Carboxyhemoglobin: 1.1 % (ref 0.5–1.5)
Methemoglobin: 0.8 % (ref 0.0–1.5)
O2 Saturation: 40.4 %
Total hemoglobin: 12.4 g/dL (ref 12.0–16.0)

## 2014-02-06 LAB — BLOOD GAS, ARTERIAL
Acid-Base Excess: 11.1 mmol/L — ABNORMAL HIGH (ref 0.0–2.0)
Bicarbonate: 35 mEq/L — ABNORMAL HIGH (ref 20.0–24.0)
Drawn by: 36496
FIO2: 0.4 %
MECHVT: 500 mL
O2 Saturation: 95.1 %
PEEP: 5 cmH2O
Patient temperature: 98.6
RATE: 15 resp/min
TCO2: 36.4 mmol/L (ref 0–100)
pCO2 arterial: 44.9 mmHg (ref 35.0–45.0)
pH, Arterial: 7.504 — ABNORMAL HIGH (ref 7.350–7.450)
pO2, Arterial: 74.6 mmHg — ABNORMAL LOW (ref 80.0–100.0)

## 2014-02-06 LAB — BODY FLUID CELL COUNT WITH DIFFERENTIAL
Eos, Fluid: 0 %
Lymphs, Fluid: 49 %
Monocyte-Macrophage-Serous Fluid: 35 % — ABNORMAL LOW (ref 50–90)
Neutrophil Count, Fluid: 16 % (ref 0–25)
Other Cells, Fluid: 0 %
WBC FLUID: 469 uL (ref 0–1000)

## 2014-02-06 LAB — COMPREHENSIVE METABOLIC PANEL
ALT: 28 U/L (ref 0–35)
AST: 34 U/L (ref 0–37)
Albumin: 3.2 g/dL — ABNORMAL LOW (ref 3.5–5.2)
Alkaline Phosphatase: 153 U/L — ABNORMAL HIGH (ref 39–117)
Anion gap: 14 (ref 5–15)
BUN: 67 mg/dL — ABNORMAL HIGH (ref 6–23)
CO2: 35 mEq/L — ABNORMAL HIGH (ref 19–32)
Calcium: 8.5 mg/dL (ref 8.4–10.5)
Chloride: 84 mEq/L — ABNORMAL LOW (ref 96–112)
Creatinine, Ser: 1.42 mg/dL — ABNORMAL HIGH (ref 0.50–1.10)
GFR calc Af Amer: 41 mL/min — ABNORMAL LOW (ref >90)
GFR calc non Af Amer: 35 mL/min — ABNORMAL LOW (ref >90)
Glucose, Bld: 154 mg/dL — ABNORMAL HIGH (ref 70–99)
Potassium: 3.3 mEq/L — ABNORMAL LOW (ref 3.7–5.3)
Sodium: 133 mEq/L — ABNORMAL LOW (ref 137–147)
Total Bilirubin: 1.7 mg/dL — ABNORMAL HIGH (ref 0.3–1.2)
Total Protein: 7 g/dL (ref 6.0–8.3)

## 2014-02-06 LAB — LACTATE DEHYDROGENASE: LDH: 429 U/L — ABNORMAL HIGH (ref 94–250)

## 2014-02-06 LAB — CBC
HCT: 23.5 % — ABNORMAL LOW (ref 36.0–46.0)
Hemoglobin: 7.5 g/dL — ABNORMAL LOW (ref 12.0–15.0)
MCH: 29.4 pg (ref 26.0–34.0)
MCHC: 31.9 g/dL (ref 30.0–36.0)
MCV: 92.2 fL (ref 78.0–100.0)
Platelets: 307 10*3/uL (ref 150–400)
RBC: 2.55 MIL/uL — ABNORMAL LOW (ref 3.87–5.11)
RDW: 18.1 % — ABNORMAL HIGH (ref 11.5–15.5)
WBC: 15.2 10*3/uL — ABNORMAL HIGH (ref 4.0–10.5)

## 2014-02-06 LAB — LACTATE DEHYDROGENASE, PLEURAL OR PERITONEAL FLUID: LD, Fluid: 257 U/L — ABNORMAL HIGH (ref 3–23)

## 2014-02-06 LAB — APTT: aPTT: 59 seconds — ABNORMAL HIGH (ref 24–37)

## 2014-02-06 LAB — GLUCOSE, CAPILLARY
Glucose-Capillary: 134 mg/dL — ABNORMAL HIGH (ref 70–99)
Glucose-Capillary: 166 mg/dL — ABNORMAL HIGH (ref 70–99)
Glucose-Capillary: 139 mg/dL — ABNORMAL HIGH (ref 70–99)
Glucose-Capillary: 104 mg/dL — ABNORMAL HIGH (ref 70–99)
Glucose-Capillary: 127 mg/dL — ABNORMAL HIGH (ref 70–99)

## 2014-02-06 LAB — MAGNESIUM: MAGNESIUM: 1.7 mg/dL (ref 1.5–2.5)

## 2014-02-06 LAB — PROTEIN, BODY FLUID: TOTAL PROTEIN, FLUID: 2.1 g/dL

## 2014-02-06 LAB — PROTEIN, TOTAL: TOTAL PROTEIN: 7 g/dL (ref 6.0–8.3)

## 2014-02-06 LAB — PHOSPHORUS: Phosphorus: 3.6 mg/dL (ref 2.3–4.6)

## 2014-02-06 LAB — CHOLESTEROL, TOTAL: CHOLESTEROL: 55 mg/dL (ref 0–200)

## 2014-02-06 LAB — CLOSTRIDIUM DIFFICILE BY PCR: Toxigenic C. Difficile by PCR: NEGATIVE

## 2014-02-06 MED ORDER — POTASSIUM CHLORIDE 10 MEQ/50ML IV SOLN
10.0000 meq | INTRAVENOUS | Status: AC
  Administered 2014-02-06 (×3): 10 meq via INTRAVENOUS
  Filled 2014-02-06 (×2): qty 50

## 2014-02-06 MED ORDER — DEXTROSE 5 % IV SOLN
1.0000 g | Freq: Two times a day (BID) | INTRAVENOUS | Status: DC
  Administered 2014-02-07 – 2014-02-09 (×5): 1 g via INTRAVENOUS
  Filled 2014-02-06 (×7): qty 1

## 2014-02-06 MED ORDER — METOLAZONE 5 MG PO TABS
5.0000 mg | ORAL_TABLET | Freq: Every day | ORAL | Status: DC
  Administered 2014-02-07: 5 mg via ORAL
  Filled 2014-02-06: qty 1

## 2014-02-06 MED ORDER — ACETAZOLAMIDE SODIUM 500 MG IJ SOLR
250.0000 mg | Freq: Three times a day (TID) | INTRAMUSCULAR | Status: AC
  Administered 2014-02-06 (×2): 250 mg via INTRAVENOUS
  Filled 2014-02-06 (×2): qty 500

## 2014-02-06 MED ORDER — MAGNESIUM SULFATE 40 MG/ML IJ SOLN
2.0000 g | Freq: Once | INTRAMUSCULAR | Status: AC
  Administered 2014-02-06: 2 g via INTRAVENOUS
  Filled 2014-02-06: qty 50

## 2014-02-06 MED ORDER — DEXTROSE 5 % IV SOLN
8.0000 mg/h | INTRAVENOUS | Status: AC
  Filled 2014-02-06: qty 25

## 2014-02-06 NOTE — Procedures (Signed)
Thoracentesis Procedure Note  Pre-operative Diagnosis: R sided pleural effusion  Post-operative Diagnosis: same  Indications: R sided pleural effusion  Procedure Details  Consent: Informed consent was obtained. Risks of the procedure were discussed including: infection, bleeding, pain, pneumothorax.  Under sterile conditions the patient was positioned. Betadine solution and sterile drapes were utilized.  1% plain lidocaine was used to anesthetize the 5 rib space. Fluid was obtained without any difficulties and minimal blood loss.  A dressing was applied to the wound and wound care instructions were provided.   Findings 1200 ml of clear pleural fluid was obtained. A sample was sent to Pathology for cytogenetics, flow, and cell counts, as well as for infection analysis.  Complications:  None; patient tolerated the procedure well.          Condition: stable  Plan A follow up chest x-ray was ordered. Bed Rest for 0 hours. Tylenol 650 mg. for pain.  U/S used in performing the procedure.  Attending Attestation: I performed the procedure.  Alyson Reedy, M.D. [facility] Pulmonary/Critical Care Medicine. Pager: [phone]. After hours pager: [phone].

## 2014-02-06 NOTE — Progress Notes (Signed)
Placed pt back on full support due to increased WOB and agitation

## 2014-02-06 NOTE — Progress Notes (Addendum)
TCTS DAILY ICU PROGRESS NOTE                   [address]            [place] [address]          [phone]   12 Days Post-Op Procedure(s) (LRB): CORONARY ARTERY BYPASS GRAFTING TIMES FOUR USING LEFT INTERNAL MAMMARY ARTERY TO LAD, SAPHENOUS VEIN GRAFTS TO OM1, OM2, AND PDA (N/A) MITRAL VALVE (MV) REPLACEMENT (N/A)  Total Length of Stay:  LOS: 12 days   Subjective: Opens eyes, follows commands, more alert this am.  On vent. Had several loose stools overnight.   Objective: Vital signs in last 24 hours: Temp:  [97.5 F (36.4 C)-98.5 F (36.9 C)] 98.5 F (36.9 C) (07/24 0400) Pulse Rate:  [68-90] 79 (07/24 0600) Cardiac Rhythm:  [-] A-V Sequential paced (07/24 0400) Resp:  [12-34] 12 (07/24 0600) BP: (78-130)/(15-82) 111/56 mmHg (07/24 0600) SpO2:  [96 %-100 %] 96 % (07/24 0600) FiO2 (%):  [40 %] 40 % (07/24 0400) Weight:  [236 lb 1.8 oz (107.1 kg)] 236 lb 1.8 oz (107.1 kg) (07/24 0500)  Filed Weights   02/04/14 0515 02/05/14 0500 02/06/14 0500  Weight: 238 lb 8.6 oz (108.2 kg) 237 lb 11.2 oz (107.82 kg) 236 lb 1.8 oz (107.1 kg)    Weight change: -1 lb 9.4 oz (-0.72 kg)   Hemodynamic parameters for last 24 hours: CVP:  [10 mmHg-20 mmHg] 19 mmHg  Intake/Output from previous day: 07/23 0701 - 07/24 0700 In: 3565.4 [I.V.:915.4; NG/GT:1650; IV Piggyback:1000] Out: 3950 [Urine:3950]  Intake/Output this shift:    Current Meds: Scheduled Meds: . amiodarone  200 mg Per NG tube BID  . antiseptic oral rinse  15 mL Mouth Rinse QID  . aspirin EC  325 mg Oral Daily   Or  . aspirin  324 mg Per Tube Daily  . atorvastatin  80 mg Oral q1800  . bisacodyl  10 mg Oral Daily   Or  . bisacodyl  10 mg Rectal Daily  . cefTAZidime (FORTAZ)  IV  1 g Intravenous 3 times per day  . chlorhexidine  15 mL Mouth Rinse BID  . docusate  200 mg Oral Daily  . feeding supplement (PRO-STAT SUGAR FREE 64)  30 mL Per Tube BID  . insulin aspart  0-24 Units Subcutaneous 6  times per day  . insulin glargine  20 Units Subcutaneous BID  . levothyroxine  75 mcg Oral QAC breakfast  . metolazone  10 mg Oral Daily  . pantoprazole sodium  40 mg Per Tube Daily  . potassium chloride  10 mEq Intravenous Q1 Hr x 3  . potassium chloride  40 mEq Per Tube BID  . sodium chloride  10-40 mL Intracatheter Q12H   Continuous Infusions: . sodium chloride Stopped (02/01/14 0830)  . sodium chloride 250 mL (02/05/14 2000)  . bivalirudin (ANGIOMAX) infusion 0.5 mg/mL (Non-ACS indications) 0.048 mg/kg/hr (02/05/14 1900)  . DOPamine 3 mcg/kg/min (02/05/14 1900)  . feeding supplement (VITAL HIGH PROTEIN) 1,000 mL (02/06/14 0419)  . furosemide (LASIX) infusion 10 mg/hr (02/05/14 1900)  . lactated ringers 10 mL/hr at 02/01/14 1300  . milrinone 0.2 mcg/kg/min (02/05/14 1900)   PRN Meds:.HYDROcodone-acetaminophen, ondansetron (ZOFRAN) IV, sodium chloride   Physical Exam: General appearance: Opens eyes, moves extremities, nods appropriately to questions Heart: Paced at 80, brady in 50s under pacer Lungs: diminished breath sounds bibasilar Abdomen: soft, non-tender; bowel sounds normal; no masses,  no  organomegaly Extremities: Edematous Wound: Clean and dry   Lab Results: CBC: Recent Labs  02/05/14 0430 02/06/14 0435  WBC 13.7* 15.2*  HGB 8.0* 7.5*  HCT 24.3* 23.5*  PLT 266 307   BMET:  Recent Labs  02/05/14 0430 02/06/14 0435  NA 132* 133*  K 3.3* 3.3*  CL 87* 84*  CO2 34* 35*  GLUCOSE 167* 154*  BUN 56* 67*  CREATININE 1.26* 1.42*  CALCIUM 8.0* 8.5    PT/INR: No results found for this basename: LABPROT, INR,  in the last 72 hours Radiology: Dg Chest Port 1 View  02/05/2014   CLINICAL DATA:  Status post coronary artery bypass graft.  EXAM: PORTABLE CHEST - 1 VIEW  COMPARISON:  February 04, 2014.  FINDINGS: Stable cardiomediastinal silhouette. Endotracheal tube is noted with distal tip 5 cm above the carina. Nasogastric tube is seen entering the stomach. Status post  coronary artery bypass graft. Bilateral pulmonary edema appears improved. Stable bilateral pleural effusions are noted. Swan-Ganz catheter has been removed. No pneumothorax is noted.  IMPRESSION: Endotracheal tube in grossly good position. Improved bilateral pulmonary edema with stable bilateral pleural effusions.   Electronically Signed   By: Roque LiasJames  Green M.D.   On: 02/05/2014 08:01     Assessment/Plan: S/P Procedure(s) (LRB): CORONARY ARTERY BYPASS GRAFTING TIMES FOUR USING LEFT INTERNAL MAMMARY ARTERY TO LAD, SAPHENOUS VEIN GRAFTS TO OM1, OM2, AND PDA (N/A) MITRAL VALVE (MV) REPLACEMENT (N/A) CV- , Paced at 80, BPs remain low normal. Co-ox down today at 40.4, CVP 7. Remains on Dopamine, Milrinone gtts.   VDRF- More alert today, so will continue vent wean as tolerated.  May ultimately need trach. CCM following.  Vol overload- Remains edematous, weight trending down. Excellent UOP on Diamox, Zaroxolyn, Lasix gtt.  Nutrition- continue TFs.  Hypokalemia- replace K+. Renal- Cr up slightly this am, will continue to monitor. GI- loose stools overnight, possibly from TFs.  C diff assay pending. Leukocytosis, no fever. On [place] D#9 for presumed pneumonia. Checking C diff. Postop anemia- [name]/[name] decreased today, will continue to watch.  DM- sugars generally stable on Lantus. Anticoagulation- Continue bivalirudin for prosthetic MV.   [name],[name] 02/06/2014 7:40 AM  Close to extubation, bilateral pleural effusions right > left discussed with pulmonary will hold angio max and thoracentesis later today I have seen and examined [name] and agree with the above assessment  and plan.  Delight OvensEdward B Janece Laidlaw MD [facility] [phone] Office [phone] 02/06/2014 9:47 AM

## 2014-02-06 NOTE — Progress Notes (Addendum)
ANTICOAGULATION CONSULT NOTE - Follow Up Consult  Pharmacy Consult for Bivalirudin Indication: MVR  No Known Allergies  Patient Measurements: Height: 5\' 7"  (170.2 cm) Weight: 236 lb 1.8 oz (107.1 kg) IBW/kg (Calculated) : 61.6 Heparin Dosing Weight: 107 kg  Vital Signs: Temp: 98.5 F (36.9 C) (07/24 0400) Temp src: Oral (07/24 0400) BP: 106/54 mmHg (07/24 0900) Pulse Rate: 82 (07/24 0900)  Labs:  Recent Labs  02/04/14 0355 02/05/14 0430 02/06/14 0435  HGB 8.8* 8.0* 7.5*  HCT 26.5* 24.3* 23.5*  PLT 236 266 307  APTT 60* 59* 59*  CREATININE 1.20* 1.26* 1.42*    Estimated Creatinine Clearance: 43.8 ml/min (by C-G formula based on Cr of 1.42).  Assessment: Patient is a 73 y.o F  currently on bilvalirudin s/p prosthetic tissue MVR.  Platelets stable, hgb down 7.5.  No bleeding documented.  Goal of Therapy:  aPTT 50-90 seconds Monitor platelets by anticoagulation protocol: Yes   Plan:  1. Continue bival at 0.048mg /kg/hr 2. Continue daily aPTT and CBC   Dredyn Gubbels P 02/06/2014,9:24 AM  Adden: scr 1.42 (crcl~44).  Cefepime changed to 1gm IV q12h for renal function.

## 2014-02-06 NOTE — Progress Notes (Signed)
TCTS BRIEF SICU PROGRESS NOTE  12 Days Post-Op  S/P Procedure(s) (LRB): CORONARY ARTERY BYPASS GRAFTING TIMES FOUR USING LEFT INTERNAL MAMMARY ARTERY TO LAD, SAPHENOUS VEIN GRAFTS TO OM1, OM2, AND PDA (N/A) MITRAL VALVE (MV) REPLACEMENT (N/A)   Overall reasonably stable day Tolerated vent weaning all day but some increased WOB this afternoon S/P thoracentesis  Plan: Continue present plan  OWEN,[name] 02/06/2014 5:41 PM

## 2014-02-06 NOTE — Progress Notes (Signed)
PULMONARY / CRITICAL CARE MEDICINE   Name: [name] MRN: [id number] DOB: 09-Jan-1941    ADMISSION DATE:  01/25/2014 CONSULTATION DATE:  02/06/2014  REFERRING MD :  Tyrone Sage  CHIEF COMPLAINT:  CVTS  INITIAL PRESENTATION: 73 y.o. F who was brought to ED on 7/12 as code STEMI.  Taken to cath lab but unable to perform PCI due to significant RCA disease.  Taken to OR emergently for CABG x 4.  Returned to SICU on vent.  Has been on pressors for on going shock (dopamine, Epi, Milrinone).  PCCM was consulted.  STUDIES:  Echo 7/20 >>>  EF 55%, no RMA, grade 3 diastolic dysfunction, PA pressure 41.  SIGNIFICANT EVENTS: Cath 7/12 >>> occlusion of RCA not approachable with PCI Balloon Pump 7/12 >>> 7/17 CABG x 4 and MVR 7/13  SUBJECTIVE: No events overnight, awake and interactive  VITAL SIGNS: Temp:  [97.5 F (36.4 C)-98.5 F (36.9 C)] 98.5 F (36.9 C) (07/24 0400) Pulse Rate:  [79-89] 82 (07/24 0900) Resp:  [12-34] 26 (07/24 0900) BP: (78-130)/(15-82) 106/54 mmHg (07/24 0900) SpO2:  [96 %-100 %] 98 % (07/24 0900) FiO2 (%):  [40 %] 40 % (07/24 0852) Weight:  [236 lb 1.8 oz (107.1 kg)] 236 lb 1.8 oz (107.1 kg) (07/24 0500)  HEMODYNAMICS: CVP:  [7 mmHg-20 mmHg] 7 mmHg  VENTILATOR SETTINGS: Vent Mode:  [-] PSV;CPAP FiO2 (%):  [40 %] 40 % Set Rate:  [4 bmp-16 bmp] 4 bmp Vt Set:  [500 mL] 500 mL PEEP:  [5 cmH20] 5 cmH20 Pressure Support:  [5 cmH20-10 cmH20] 10 cmH20 Plateau Pressure:  [23 cmH20-24 cmH20] 23 cmH20  INTAKE / OUTPUT: Intake/Output     07/23 0701 - 07/24 0700 07/24 0701 - 07/25 0700   I.V. (mL/kg) 915.4 (8.5) 69.6 (0.6)   NG/GT 1650 220   IV Piggyback 1000 100   Total Intake(mL/kg) 3565.4 (33.3) 389.6 (3.6)   Urine (mL/kg/hr) 3950 (1.5) 390 (1.4)   Stool     Total Output 3950 390   Net -384.6 -0.4        Stool Occurrence 3 x     PHYSICAL EXAMINATION: General: WDWN female, in NAD. Neuro: Nods head in response to questions, follows commands. HEENT: [place]/AT.  PERRL, sclerae anicteric. Cardiovascular: RRR, SEM. Lungs: Respirations even and unlabored.  CTA bilaterally, No W/R/R.  On vent. Abdomen: BS x 4, soft, NT/ND.  Musculoskeletal: No gross deformities, 3+ edema. Skin: Intact, warm, no rashes.  LABS:  CBC  Recent Labs Lab 02/04/14 0355 02/05/14 0430 02/06/14 0435  WBC 11.0* 13.7* 15.2*  HGB 8.8* 8.0* 7.5*  HCT 26.5* 24.3* 23.5*  PLT 236 266 307   Coag's  Recent Labs Lab 02/04/14 0355 02/05/14 0430 02/06/14 0435  APTT 60* 59* 59*   BMET  Recent Labs Lab 02/04/14 0355 02/05/14 0430 02/06/14 0435  NA 131* 132* 133*  K 3.3* 3.3* 3.3*  CL 88* 87* 84*  CO2 32 34* 35*  BUN 44* 56* 67*  CREATININE 1.20* 1.26* 1.42*  GLUCOSE 137* 167* 154*   Electrolytes  Recent Labs Lab 02/04/14 0355 02/05/14 0430 02/06/14 0435  CALCIUM 8.2* 8.0* 8.5  MG 1.5 1.6 1.7  PHOS  --  3.1 3.6   Sepsis Markers No results found for this basename: LATICACIDVEN, PROCALCITON, O2SATVEN,  in the last 168 hours ABG  Recent Labs Lab 02/04/14 0358 02/05/14 0505 02/06/14 0440  PHART 7.552* 7.532* 7.504*  PCO2ART 41.0 41.6 44.9  PO2ART 58.0* 75.7* 74.6*   Liver  Enzymes  Recent Labs Lab 02/04/14 0355 02/05/14 0430 02/06/14 0435  AST 38* 32 34  ALT 40* 32 28  ALKPHOS 169* 155* 153*  BILITOT 1.2 1.2 1.7*  ALBUMIN 2.2* 2.1* 3.2*   Cardiac Enzymes No results found for this basename: TROPONINI, PROBNP,  in the last 168 hours Glucose  Recent Labs Lab 02/05/14 1241 02/05/14 1617 02/05/14 1937 02/05/14 2342 02/06/14 0335 02/06/14 0733  GLUCAP 153* 149* 170* 179* 134* 166*   Imaging Dg Chest Port 1 View  02/06/2014   CLINICAL DATA:  Evaluate endotracheal tube.  EXAM: PORTABLE CHEST - 1 VIEW  COMPARISON:  02/05/2014  FINDINGS: Endotracheal tube is 4.2 cm above the carina. Nasogastric tube extends into the abdomen. Prominent vascular structures with diffuse lung densities. Persistent basilar densities which are incompletely  evaluated. Heart size is within normal limits. PICC line tip is in the lower SVC region. Feeding tube extends into the abdomen.  IMPRESSION: Diffuse lung densities are suggestive for pulmonary edema.  Persistent bibasilar densities probably represent a combination of atelectasis and pleural fluid.  Support apparatuses as described.   Electronically Signed   By: Richarda OverlieAdam  Henn M.D.   On: 02/06/2014 07:48   Dg Chest Port 1 View  02/05/2014   CLINICAL DATA:  Status post coronary artery bypass graft.  EXAM: PORTABLE CHEST - 1 VIEW  COMPARISON:  February 04, 2014.  FINDINGS: Stable cardiomediastinal silhouette. Endotracheal tube is noted with distal tip 5 cm above the carina. Nasogastric tube is seen entering the stomach. Status post coronary artery bypass graft. Bilateral pulmonary edema appears improved. Stable bilateral pleural effusions are noted. Swan-Ganz catheter has been removed. No pneumothorax is noted.  IMPRESSION: Endotracheal tube in grossly good position. Improved bilateral pulmonary edema with stable bilateral pleural effusions.   Electronically Signed   By: Roque LiasJames  Green M.D.   On: 02/05/2014 08:01     ASSESSMENT / PLAN:  PULMONARY OETT 7/13 A: VDRF - s/p CABG Pulmonary HTN - PA pressures 41 on echo 7/20 P:   - Continue PS trials. - Diureses as below. Ria Comment- Thora today then will consider extubation post thora. - ABG and CXR in AM.  CARDIOVASCULAR CVL R IJ 7/12 A:  S/p CABG x 4 S/p MVR Cardiogenic Shock Volume overload P:  - Post op care / pressor weaning / lasix drip per CVTS / Cardiology. - D/Ced epi drip today. - Dopamine at 3, milrinone at 0.2. - CVP 7, will continue diureses.  RENAL A:   Volume overload Hyponatremia Hypokalemia AKI P:   - Lasix drip decreased to 8 mg/hr. - Zaroxolyn 5 mg PO. - BMP in AM. - Replace electrolytes as indicated. - Hold further albumin. - Acetazolamide for diuretic induced metabolic acidosis.  GASTROINTESTINAL A:   Nutrition P:   -  Continue TF's. - SUP:  Pantoprazole.  HEMATOLOGIC A:   VTE Prophylaxis Post op anemia Post op thrombocytopenia - HIT panel negative. P:  - SCD's / Bivalirudin. - CBC in AM.  INFECTIOUS A:   Leukocytosis Concern for PNA P:   - Abx: Fortaz, start date 7/15, day 10/x, will likely continue for a total of 14 days then stop.  ENDOCRINE A:   DM P:   - CBG's q4hr. - SSI.  NEUROLOGIC A:   Acute Encephalopathy P:   - RASS goal: 0. - D/C all sedation and change to PRN.  I have personally obtained a history, examined the patient, evaluated laboratory and imaging results, formulated the assessment and plan and  placed orders.  CRITICAL CARE: The patient is critically ill with multiple organ systems failure and requires high complexity decision making for assessment and support, frequent evaluation and titration of therapies, application of advanced monitoring technologies and extensive interpretation of multiple databases. Critical Care Time devoted to patient care services described in this note is 35 minutes.   Alyson Reedy, M.D. [facility] Pulmonary/Critical Care Medicine. Pager: [phone]. After hours pager: [phone].

## 2014-02-07 ENCOUNTER — Inpatient Hospital Stay (HOSPITAL_COMMUNITY): Payer: Medicare Other

## 2014-02-07 LAB — BLOOD GAS, ARTERIAL
ACID-BASE EXCESS: 10.7 mmol/L — AB (ref 0.0–2.0)
Bicarbonate: 34.4 mEq/L — ABNORMAL HIGH (ref 20.0–24.0)
DRAWN BY: 36989
FIO2: 0.4 %
LHR: 15 {breaths}/min
O2 Saturation: 97.1 %
PEEP: 5 cmH2O
Patient temperature: 98.6
TCO2: 35.7 mmol/L (ref 0–100)
VT: 500 mL
pCO2 arterial: 42.7 mmHg (ref 35.0–45.0)
pH, Arterial: 7.516 — ABNORMAL HIGH (ref 7.350–7.450)
pO2, Arterial: 87.5 mmHg (ref 80.0–100.0)

## 2014-02-07 LAB — MAGNESIUM: Magnesium: 2.1 mg/dL (ref 1.5–2.5)

## 2014-02-07 LAB — COMPREHENSIVE METABOLIC PANEL
ALT: 24 U/L (ref 0–35)
AST: 35 U/L (ref 0–37)
Albumin: 2.9 g/dL — ABNORMAL LOW (ref 3.5–5.2)
Alkaline Phosphatase: 146 U/L — ABNORMAL HIGH (ref 39–117)
Anion gap: 13 (ref 5–15)
BUN: 78 mg/dL — ABNORMAL HIGH (ref 6–23)
CO2: 36 mEq/L — ABNORMAL HIGH (ref 19–32)
Calcium: 8.7 mg/dL (ref 8.4–10.5)
Chloride: 86 mEq/L — ABNORMAL LOW (ref 96–112)
Creatinine, Ser: 1.69 mg/dL — ABNORMAL HIGH (ref 0.50–1.10)
GFR calc Af Amer: 33 mL/min — ABNORMAL LOW (ref >90)
GFR calc non Af Amer: 29 mL/min — ABNORMAL LOW (ref >90)
Glucose, Bld: 142 mg/dL — ABNORMAL HIGH (ref 70–99)
Potassium: 3.7 mEq/L (ref 3.7–5.3)
Sodium: 135 mEq/L — ABNORMAL LOW (ref 137–147)
Total Bilirubin: 1.5 mg/dL — ABNORMAL HIGH (ref 0.3–1.2)
Total Protein: 7 g/dL (ref 6.0–8.3)

## 2014-02-07 LAB — GLUCOSE, CAPILLARY
Glucose-Capillary: 140 mg/dL — ABNORMAL HIGH (ref 70–99)
Glucose-Capillary: 174 mg/dL — ABNORMAL HIGH (ref 70–99)
Glucose-Capillary: 211 mg/dL — ABNORMAL HIGH (ref 70–99)
Glucose-Capillary: 158 mg/dL — ABNORMAL HIGH (ref 70–99)
Glucose-Capillary: 136 mg/dL — ABNORMAL HIGH (ref 70–99)

## 2014-02-07 LAB — CBC
HCT: 24.2 % — ABNORMAL LOW (ref 36.0–46.0)
Hemoglobin: 7.8 g/dL — ABNORMAL LOW (ref 12.0–15.0)
MCH: 30 pg (ref 26.0–34.0)
MCHC: 32.2 g/dL (ref 30.0–36.0)
MCV: 93.1 fL (ref 78.0–100.0)
Platelets: 397 10*3/uL (ref 150–400)
RBC: 2.6 MIL/uL — ABNORMAL LOW (ref 3.87–5.11)
RDW: 18.5 % — ABNORMAL HIGH (ref 11.5–15.5)
WBC: 16.4 10*3/uL — ABNORMAL HIGH (ref 4.0–10.5)

## 2014-02-07 LAB — PHOSPHORUS: Phosphorus: 4.2 mg/dL (ref 2.3–4.6)

## 2014-02-07 LAB — CARBOXYHEMOGLOBIN
Carboxyhemoglobin: 1.3 % (ref 0.5–1.5)
Methemoglobin: 1 % (ref 0.0–1.5)
O2 Saturation: 43.9 %
Total hemoglobin: 8.9 g/dL — ABNORMAL LOW (ref 12.0–16.0)

## 2014-02-07 LAB — PH, BODY FLUID: pH, Fluid: 8.5

## 2014-02-07 LAB — PREPARE RBC (CROSSMATCH)

## 2014-02-07 LAB — APTT
aPTT: 64 seconds — ABNORMAL HIGH (ref 24–37)
aPTT: 94 seconds — ABNORMAL HIGH (ref 24–37)

## 2014-02-07 MED ORDER — FENTANYL CITRATE 0.05 MG/ML IJ SOLN
12.5000 ug | INTRAMUSCULAR | Status: DC | PRN
  Administered 2014-02-07 – 2014-02-08 (×2): 12.5 ug via INTRAVENOUS
  Filled 2014-02-07 (×2): qty 2

## 2014-02-07 MED ORDER — POTASSIUM CHLORIDE 10 MEQ/50ML IV SOLN
10.0000 meq | INTRAVENOUS | Status: AC
  Administered 2014-02-07 (×3): 10 meq via INTRAVENOUS
  Filled 2014-02-07 (×2): qty 50

## 2014-02-07 MED ORDER — FUROSEMIDE 10 MG/ML IJ SOLN
40.0000 mg | Freq: Once | INTRAMUSCULAR | Status: AC
  Administered 2014-02-07: 40 mg via INTRAVENOUS

## 2014-02-07 MED ORDER — FUROSEMIDE 10 MG/ML IJ SOLN
INTRAMUSCULAR | Status: AC
Start: 2014-02-07 — End: 2014-02-07
  Filled 2014-02-07: qty 4

## 2014-02-07 MED ORDER — FUROSEMIDE 10 MG/ML IJ SOLN
40.0000 mg | Freq: Once | INTRAMUSCULAR | Status: AC
Start: 2014-02-07 — End: 2014-02-07
  Administered 2014-02-07: 40 mg via INTRAVENOUS
  Filled 2014-02-07: qty 4

## 2014-02-07 MED ORDER — PANTOPRAZOLE SODIUM 40 MG IV SOLR
40.0000 mg | INTRAVENOUS | Status: DC
  Administered 2014-02-08 – 2014-02-13 (×6): 40 mg via INTRAVENOUS
  Filled 2014-02-07 (×9): qty 40

## 2014-02-07 MED ORDER — INSULIN GLARGINE 100 UNIT/ML ~~LOC~~ SOLN
10.0000 [IU] | Freq: Two times a day (BID) | SUBCUTANEOUS | Status: DC
  Filled 2014-02-07: qty 0.1

## 2014-02-07 MED ORDER — DOPAMINE-DEXTROSE 3.2-5 MG/ML-% IV SOLN
3.0000 ug/kg/min | INTRAVENOUS | Status: DC
  Administered 2014-02-07: 2 ug/kg/min via INTRAVENOUS

## 2014-02-07 NOTE — Progress Notes (Signed)
      [address]       [place] [address]             [phone]        CARDIOTHORACIC SURGERY PROGRESS NOTE   R13 Days Post-Op Procedure(s) (LRB): CORONARY ARTERY BYPASS GRAFTING TIMES FOUR USING LEFT INTERNAL MAMMARY ARTERY TO LAD, SAPHENOUS VEIN GRAFTS TO OM1, OM2, AND PDA (N/A) MITRAL VALVE (MV) REPLACEMENT (N/A)  Subjective: Extubated 1 hour ago.  Nods her head appropriately and indicates that she's breathing okay but not speaking  Objective: Vital signs: BP Readings from Last 1 Encounters:  02/07/14 88/53   Pulse Readings from Last 1 Encounters:  02/07/14 114   Resp Readings from Last 1 Encounters:  02/07/14 22   Temp Readings from Last 1 Encounters:  02/07/14 96.4 F (35.8 C)     Hemodynamics: CVP:  [7 mmHg-18 mmHg] 12 mmHg  Physical Exam:  Rhythm:   sinus  Breath sounds: Few rhonchi  Heart sounds:  RRR  Incisions:  Clean and dry  Abdomen:  Soft, non-distended, non-tender  Extremities:  Warm, adequately perfused   Intake/Output from previous day: 07/24 0701 - 07/25 0700 In: 2390 [I.V.:850; NG/GT:1240; IV Piggyback:300] Out: 3571 [Urine:2350; Emesis/NG output:20; Stool:1] Intake/Output this shift: Total I/O In: 457.8 [I.V.:102.8; NG/GT:255; IV Piggyback:100] Out: 250 [Urine:250]  Lab Results:  CBC: Recent Labs  02/06/14 0435 02/07/14 0400  WBC 15.2* 16.4*  HGB 7.5* 7.8*  HCT 23.5* 24.2*  PLT 307 397    BMET:  Recent Labs  02/06/14 0435 02/07/14 0400  NA 133* 135*  K 3.3* 3.7  CL 84* 86*  CO2 35* 36*  GLUCOSE 154* 142*  BUN 67* 78*  CREATININE 1.42* 1.69*  CALCIUM 8.5 8.7     CBG (last 3)   Recent Labs  02/06/14 2021 02/06/14 2352 02/07/14 0810  GLUCAP 127* 140* 174*    ABG    Component Value Date/Time   PHART 7.516* 02/07/2014 0437   PCO2ART 42.7 02/07/2014 0437   PO2ART 87.5 02/07/2014 0437   HCO3 34.4* 02/07/2014 0437   TCO2 35.7 02/07/2014 0437   ACIDBASEDEF 0.1 01/30/2014 0339   O2SAT 97.1  02/07/2014 0437    CXR: PORTABLE CHEST - 1 VIEW  COMPARISON: 02/06/2014.  FINDINGS:  The endotracheal tube, NG tube and right PICC line are stable.  Persistent mild cardiac enlargement with pulmonary edema, pleural  effusions and areas of atelectasis.  IMPRESSION:  Stable support apparatus.  Persistent edema, atelectasis and effusions.  Electronically Signed  By: Loralie Champagne M.D.  On: 02/07/2014 08:37   Assessment/Plan: S/P Procedure(s) (LRB): CORONARY ARTERY BYPASS GRAFTING TIMES FOUR USING LEFT INTERNAL MAMMARY ARTERY TO LAD, SAPHENOUS VEIN GRAFTS TO OM1, OM2, AND PDA (N/A) MITRAL VALVE (MV) REPLACEMENT (N/A)  Overall stable from hemodynamic standpoint although mixed venous co-ox still <50% Maintaining NSR w/out further episodes of Afib or bradycardia Just extubated and stable so far, CXR improved this morning BUN/creatinine climbing - likely pre-renal Expected post op acute blood loss anemia, worse   Will stop lasix drip and start intermittent IV dosing to keep I/O's balanced to slightly negative  Transfuse 2 units PRBC's for improved O2 carrying capacity and watch anemia, co-ox  Continue milrinone for now  Hold on po's until clearly stable off vent and mental status improved   Jodeci Rini H 02/07/2014 11:04 AM

## 2014-02-07 NOTE — Progress Notes (Signed)
PULMONARY / CRITICAL CARE MEDICINE   Name: [name] MRN: [id number] DOB: 12-31-1940    ADMISSION DATE:  01/25/2014 CONSULTATION DATE:  02/07/2014  REFERRING MD :  Tyrone SageGerhardt  CHIEF COMPLAINT:  CVTS  INITIAL PRESENTATION: 73 y.o. F who was brought to ED on 7/12 as code STEMI.  Taken to cath lab but unable to perform PCI due to significant RCA disease.  Taken to OR emergently for CABG x 4.  Returned to SICU on vent.  Has been on pressors for on going shock (dopamine, Epi, Milrinone).  PCCM was consulted.  STUDIES:  Echo 7/20 >>>  EF 55%, no RMA, grade 3 diastolic dysfunction, PA pressure 41.  SIGNIFICANT EVENTS: Cath 7/12 >>> occlusion of RCA not approachable with PCI Balloon Pump 7/12 >>> 7/17 CABG x 4 and MVR 7/13 7/25 Passed SBT - extubated  SUBJECTIVE:  RASS -1. Not F/C (RN thinks out of stubbornness and reports will F/C intermittently)  VITAL SIGNS: Temp:  [97.6 F (36.4 C)-98.9 F (37.2 C)] 97.9 F (36.6 C) (07/25 0812) Pulse Rate:  [69-111] 108 (07/25 0920) Resp:  [7-33] 24 (07/25 0920) BP: (78-121)/(47-73) 88/53 mmHg (07/25 0920) SpO2:  [91 %-100 %] 100 % (07/25 0920) FiO2 (%):  [40 %] 40 % (07/25 0920) Weight:  [103.239 kg (227 lb 9.6 oz)] 103.239 kg (227 lb 9.6 oz) (07/25 0400)  HEMODYNAMICS: CVP:  [7 mmHg-18 mmHg] 12 mmHg  VENTILATOR SETTINGS: Vent Mode:  [-] PSV FiO2 (%):  [40 %] 40 % Set Rate:  [14 bmp-15 bmp] 15 bmp Vt Set:  [500 mL] 500 mL PEEP:  [5 cmH20] 5 cmH20 Pressure Support:  [5 cmH20-10 cmH20] 5 cmH20 Plateau Pressure:  [16 cmH20-24 cmH20] 16 cmH20  INTAKE / OUTPUT: Intake/Output     07/24 0701 - 07/25 0700 07/25 0701 - 07/26 0700   I.V. (mL/kg) 850 (8.2) 37.8 (0.4)   NG/GT 1240 75   IV Piggyback 300 50   Total Intake(mL/kg) 2390 (23.2) 162.8 (1.6)   Urine (mL/kg/hr) 2350 (0.9) 125 (0.4)   Emesis/NG output 20 (0)    Other 1200 (0.5)    Stool 1 (0)    Total Output 3571 125   Net -1181 +37.8        Urine Occurrence 1 x     PHYSICAL  EXAMINATION: General: NAD Neuro: No focal deficits HEENT: WNL Cardiovascular: RRR, distant HS, no M noted Lungs: clear anteriorly Abdomen: BS x 4, soft, NT/ND.  Ext: minimal symmetric edema.  LABS: I have reviewed all of today's lab results. Relevant abnormalities are discussed in the A/P section   ASSESSMENT / PLAN:  PULMONARY OETT 7/13 A: VDRF - s/p CABG Pulmonary HTN - PA pressures 41 on echo 7/20 Pulm edema Pleural effusion - s/p R thora 7/24: 1200 cc clear fluid. LDH 257, protein 2.1 P:   Extubate  Supp O2 to maintain SpO2 > 92% PRN BiPAP Airway hygiene  CARDIOVASCULAR CVL R IJ 7/12 >>  A:  S/p CABG x 4 S/p MVR Cardiogenic Shock P:  Vasopressors, inotropes per TCTS  RENAL A:   Volume overload, appears much improved Hyponatremia, resolved Hypokalemia, resolved AKI P:   Monitor BMET intermittently Monitor I/Os Correct electrolytes as indicated Cont furosemide, DA gtts per TCTS  GASTROINTESTINAL A:   Post extubation dysphagia P:   SUP: changed PPI to IV post extubation NPO post extubation  HEMATOLOGIC A:   Post op anemia Post op thrombocytopenia - HIT panel negative. P:  DVT px: full anticoagulation with  bivalirudin Monitor CBC intermittently Transfuse per usual ICU guidelines   INFECTIOUS A:   Concern for PNA P:   Micro and abx as above Decisions re: duration of caftaz per TCTS.  ENDOCRINE A:   DM P:   Decrease Lantus to 10 units daily while NPO Cont SSI  NEUROLOGIC A:   Acute Encephalopathy Post op and ICU associated discomfort P:   - RASS goal: 0. Low dose PRN fentanyl post extubation  I have personally obtained a history, examined the patient, evaluated laboratory and imaging results, formulated the assessment and plan and placed orders.  CRITICAL CARE: The patient is critically ill with multiple organ systems failure and requires high complexity decision making for assessment and support, frequent evaluation and  titration of therapies, application of advanced monitoring technologies and extensive interpretation of multiple databases. Critical Care Time devoted to patient care services described in this note is 40 minutes.   Billy Fischer, MD ; [facility] [phone].  After 5:30 PM or weekends, call [phone]

## 2014-02-07 NOTE — Progress Notes (Signed)
TCTS BRIEF SICU PROGRESS NOTE  13 Days Post-Op  S/P Procedure(s) (LRB): CORONARY ARTERY BYPASS GRAFTING TIMES FOUR USING LEFT INTERNAL MAMMARY ARTERY TO LAD, SAPHENOUS VEIN GRAFTS TO OM1, OM2, AND PDA (N/A) MITRAL VALVE (MV) REPLACEMENT (N/A)   Stable day Sinus rhythm BP stable Resp status stable w/ O2 sats 98% 2 L/min UOPadequate  Plan: Continue current plan  OWEN,[name] 02/07/2014 6:03 PM

## 2014-02-07 NOTE — Procedures (Signed)
Extubation Procedure Note  Patient Details:   Name: [name] DOB: December 29, 1940 MRN: [id number]   Pt extubated to 4L  per MD order. VS WNL. No stridor noted, RT will monitor,  Evaluation  O2 sats: stable throughout Complications: No apparent complications Patient did tolerate procedure well. Bilateral Breath Sounds: Rhonchi Suctioning: Oral;Airway No  Harley Hallmark 02/07/2014, 10:03 AM

## 2014-02-07 NOTE — Progress Notes (Signed)
ABG    Component Value Date/Time   PHART 7.516* 02/07/2014 0437   PCO2ART 42.7 02/07/2014 0437   PO2ART 87.5 02/07/2014 0437   HCO3 34.4* 02/07/2014 0437   TCO2 35.7 02/07/2014 0437   ACIDBASEDEF 0.1 01/30/2014 0339   O2SAT 97.1 02/07/2014 0437

## 2014-02-07 NOTE — Progress Notes (Signed)
ANTICOAGULATION CONSULT NOTE - Follow Up Consult  Pharmacy Consult for Bivalirudin Indication: MVR  No Known Allergies  Patient Measurements: Height: 5\' 7"  (170.2 cm) Weight: 227 lb 9.6 oz (103.239 kg) IBW/kg (Calculated) : 61.6   Vital Signs: Temp: 96.4 F (35.8 C) (07/25 1000) Temp src: Core (Comment) (07/25 0800) BP: 88/53 mmHg (07/25 1002) Pulse Rate: 114 (07/25 1002)  Labs:  Recent Labs  02/05/14 0430 02/06/14 0435 02/07/14 0400  HGB 8.0* 7.5* 7.8*  HCT 24.3* 23.5* 24.2*  PLT 266 307 397  APTT 59* 59* 64*  CREATININE 1.26* 1.42* 1.69*    Estimated Creatinine Clearance: 36.1 ml/min (by C-G formula based on Cr of 1.69).  Assessment: Patient is a 73 y.o F currently on bilvalirudin s/p prosthetic tissue MVR. HIT negative.  Platelets stable, hgb low but stable today at 7.8.  aPTT is therapeutic at 64. No bleeding documented. She's also on diuretics with Scr trending up to 1.69 today and UOP decreased to 0.30ml/kg/hr.  Goal of Therapy:  aPTT 50-90 seconds Monitor platelets by anticoagulation protocol: Yes    Plan:  1. Continue bival at 0.048mg /kg/hr 2. Continue daily aPTT and CBC 3.  With changing renal function, will recheck aPTT later tonight to r/o accumulation  Chrishauna Mee P 02/07/2014,10:15 AM

## 2014-02-08 LAB — TYPE AND SCREEN
ABO/RH(D): A POS
Antibody Screen: NEGATIVE
Unit division: 0
Unit division: 0

## 2014-02-08 LAB — POCT I-STAT 3, ART BLOOD GAS (G3+)
Acid-Base Excess: 11 mmol/L — ABNORMAL HIGH (ref 0.0–2.0)
Bicarbonate: 35.3 mEq/L — ABNORMAL HIGH (ref 20.0–24.0)
O2 Saturation: 97 %
Patient temperature: 98.4
TCO2: 37 mmol/L (ref 0–100)
pCO2 arterial: 45.3 mmHg — ABNORMAL HIGH (ref 35.0–45.0)
pH, Arterial: 7.499 — ABNORMAL HIGH (ref 7.350–7.450)
pO2, Arterial: 84 mmHg (ref 80.0–100.0)

## 2014-02-08 LAB — COMPREHENSIVE METABOLIC PANEL
ALT: 27 U/L (ref 0–35)
AST: 40 U/L — ABNORMAL HIGH (ref 0–37)
Albumin: 2.9 g/dL — ABNORMAL LOW (ref 3.5–5.2)
Alkaline Phosphatase: 165 U/L — ABNORMAL HIGH (ref 39–117)
Anion gap: 14 (ref 5–15)
BUN: 88 mg/dL — ABNORMAL HIGH (ref 6–23)
CO2: 34 mEq/L — ABNORMAL HIGH (ref 19–32)
Calcium: 8.7 mg/dL (ref 8.4–10.5)
Chloride: 88 mEq/L — ABNORMAL LOW (ref 96–112)
Creatinine, Ser: 1.92 mg/dL — ABNORMAL HIGH (ref 0.50–1.10)
GFR calc Af Amer: 29 mL/min — ABNORMAL LOW (ref >90)
GFR calc non Af Amer: 25 mL/min — ABNORMAL LOW (ref >90)
Glucose, Bld: 92 mg/dL (ref 70–99)
Potassium: 4.1 mEq/L (ref 3.7–5.3)
Sodium: 136 mEq/L — ABNORMAL LOW (ref 137–147)
Total Bilirubin: 1.8 mg/dL — ABNORMAL HIGH (ref 0.3–1.2)
Total Protein: 7.1 g/dL (ref 6.0–8.3)

## 2014-02-08 LAB — GLUCOSE, CAPILLARY
Glucose-Capillary: 100 mg/dL — ABNORMAL HIGH (ref 70–99)
Glucose-Capillary: 107 mg/dL — ABNORMAL HIGH (ref 70–99)
Glucose-Capillary: 109 mg/dL — ABNORMAL HIGH (ref 70–99)
Glucose-Capillary: 115 mg/dL — ABNORMAL HIGH (ref 70–99)
Glucose-Capillary: 86 mg/dL (ref 70–99)
Glucose-Capillary: 98 mg/dL (ref 70–99)

## 2014-02-08 LAB — APTT
aPTT: 115 seconds — ABNORMAL HIGH (ref 24–37)
aPTT: 88 seconds — ABNORMAL HIGH (ref 24–37)

## 2014-02-08 LAB — POCT I-STAT, CHEM 8
BUN: 89 mg/dL — ABNORMAL HIGH (ref 6–23)
Calcium, Ion: 1.07 mmol/L — ABNORMAL LOW (ref 1.13–1.30)
Chloride: 86 mEq/L — ABNORMAL LOW (ref 96–112)
Creatinine, Ser: 2.3 mg/dL — ABNORMAL HIGH (ref 0.50–1.10)
Glucose, Bld: 107 mg/dL — ABNORMAL HIGH (ref 70–99)
HCT: 35 % — ABNORMAL LOW (ref 36.0–46.0)
Hemoglobin: 11.9 g/dL — ABNORMAL LOW (ref 12.0–15.0)
Potassium: 3.9 mEq/L (ref 3.7–5.3)
Sodium: 131 mEq/L — ABNORMAL LOW (ref 137–147)
TCO2: 31 mmol/L (ref 0–100)

## 2014-02-08 LAB — CBC
HCT: 31.8 % — ABNORMAL LOW (ref 36.0–46.0)
Hemoglobin: 10.3 g/dL — ABNORMAL LOW (ref 12.0–15.0)
MCH: 30.6 pg (ref 26.0–34.0)
MCHC: 32.4 g/dL (ref 30.0–36.0)
MCV: 94.4 fL (ref 78.0–100.0)
Platelets: 374 10*3/uL (ref 150–400)
RBC: 3.37 MIL/uL — ABNORMAL LOW (ref 3.87–5.11)
RDW: 17.7 % — ABNORMAL HIGH (ref 11.5–15.5)
WBC: 16 10*3/uL — ABNORMAL HIGH (ref 4.0–10.5)

## 2014-02-08 LAB — CARBOXYHEMOGLOBIN
Carboxyhemoglobin: 1.5 % (ref 0.5–1.5)
Methemoglobin: 0.7 % (ref 0.0–1.5)
O2 Saturation: 54.3 %
Total hemoglobin: 9.8 g/dL — ABNORMAL LOW (ref 12.0–16.0)

## 2014-02-08 MED ORDER — METOPROLOL TARTRATE 1 MG/ML IV SOLN
INTRAVENOUS | Status: AC
  Filled 2014-02-08: qty 5

## 2014-02-08 MED ORDER — AMIODARONE HCL IN DEXTROSE 360-4.14 MG/200ML-% IV SOLN
INTRAVENOUS | Status: AC
  Filled 2014-02-08: qty 200

## 2014-02-08 MED ORDER — METOPROLOL TARTRATE 1 MG/ML IV SOLN
5.0000 mg | Freq: Once | INTRAVENOUS | Status: AC
  Administered 2014-02-08: 5 mg via INTRAVENOUS

## 2014-02-08 MED ORDER — SODIUM CHLORIDE 0.9 % IV SOLN
0.0180 mg/kg/h | INTRAVENOUS | Status: DC
  Filled 2014-02-08: qty 250

## 2014-02-08 MED ORDER — AMIODARONE LOAD VIA INFUSION
150.0000 mg | Freq: Once | INTRAVENOUS | Status: AC
  Administered 2014-02-08: 150 mg via INTRAVENOUS
  Filled 2014-02-08: qty 83.34

## 2014-02-08 MED ORDER — AMIODARONE HCL IN DEXTROSE 360-4.14 MG/200ML-% IV SOLN
20.0000 mg/h | INTRAVENOUS | Status: DC
  Administered 2014-02-08 – 2014-02-09 (×2): 30 mg/h via INTRAVENOUS
  Administered 2014-02-09 – 2014-02-10 (×3): 20 mg/h via INTRAVENOUS
  Filled 2014-02-08 (×10): qty 200

## 2014-02-08 MED ORDER — MILRINONE IN DEXTROSE 20 MG/100ML IV SOLN: 0.0000 ug/kg/min | INTRAVENOUS | Status: DC

## 2014-02-08 MED ORDER — LEVOTHYROXINE SODIUM 100 MCG IV SOLR
37.5000 ug | Freq: Every day | INTRAVENOUS | Status: DC
  Administered 2014-02-08 – 2014-02-14 (×7): 37.5 ug via INTRAVENOUS
  Filled 2014-02-08 (×9): qty 5

## 2014-02-08 MED ORDER — AMIODARONE HCL IN DEXTROSE 360-4.14 MG/200ML-% IV SOLN
60.0000 mg/h | INTRAVENOUS | Status: AC
  Administered 2014-02-08: 60 mg/h via INTRAVENOUS
  Filled 2014-02-08: qty 200

## 2014-02-08 NOTE — Progress Notes (Signed)
PULMONARY / CRITICAL CARE MEDICINE   Name: [name] MRN: [id number] DOB: May 16, 1941    ADMISSION DATE:  01/25/2014 CONSULTATION DATE:  02/08/2014  REFERRING MD :  Tyrone Sage  CHIEF COMPLAINT:  CVTS  INITIAL PRESENTATION: 73 y.o. F who was brought to ED on 7/12 as code STEMI.  Taken to cath lab but unable to perform PCI due to significant RCA disease.  Taken to OR emergently for CABG x 4.  Returned to SICU on vent.  Has been on pressors for on going shock (dopamine, Epi, Milrinone).  PCCM was consulted.  STUDIES:  Echo 7/20 >>>  EF 55%, no RMA, grade 3 diastolic dysfunction, PA pressure 41.  SIGNIFICANT EVENTS: Cath 7/12 >>> occlusion of RCA not approachable with PCI Balloon Pump 7/12 >>> 7/17 CABG x 4 and MVR 7/13 7/25 Passed SBT - extubated  SUBJECTIVE:  Has tolerated extubation. Mild increase in WOB. Denies pain. + F/C  VITAL SIGNS: Temp:  [94.7 F (34.8 C)-98.4 F (36.9 C)] 98.2 F (36.8 C) (07/26 1700) Pulse Rate:  [80-130] 126 (07/26 1700) Resp:  [23-36] 32 (07/26 1700) BP: (85-123)/(41-72) 85/65 mmHg (07/26 1700) SpO2:  [90 %-100 %] 99 % (07/26 1700) Weight:  [103.9 kg (229 lb 0.9 oz)] 103.9 kg (229 lb 0.9 oz) (07/26 0700)  HEMODYNAMICS: CVP:  [13 mmHg-18 mmHg] 15 mmHg  VENTILATOR SETTINGS:    INTAKE / OUTPUT: Intake/Output     07/25 0701 - 07/26 0700 07/26 0701 - 07/27 0700   I.V. (mL/kg) 756.1 (7.3) 429.5 (4.1)   Blood 625    NG/GT 255    IV Piggyback 200 50   Total Intake(mL/kg) 1836.1 (17.7) 479.5 (4.6)   Urine (mL/kg/hr) 1125 (0.5) 445 (0.4)   Emesis/NG output     Other     Stool     Total Output 1125 445   Net +711.1 +34.5        Stool Occurrence 1 x 1 x    PHYSICAL EXAMINATION: General: NAD Neuro: No focal deficits HEENT: WNL Cardiovascular: RRR, distant HS, no M noted Lungs: clear anteriorly Abdomen: BS x 4, soft, NT/ND.  Ext: minimal symmetric edema.  LABS: I have reviewed all of today's lab results. Relevant abnormalities are  discussed in the A/P section  CXR: NNF  ASSESSMENT / PLAN:  PULMONARY ETT 7/13 >> 7/25 A: VDRF - s/p CABG Pulmonary HTN - PA pressures 41 on echo 7/20 Pulm edema Pleural effusion - s/p R thora 7/24: 1200 cc clear fluid. LDH 257, protein 2.1 P:   Cont supp O2 to maintain SpO2 > 92% Cont PRN BiPAP  CARDIOVASCULAR CVL R IJ 7/12 >>  A:  S/p CABG x 4 S/p MVR Cardiogenic Shock P:  Vasopressors, inotropes per TCTS  RENAL A:   Volume overload, appears much improved Hyponatremia, resolved Hypokalemia, resolved AKI P:   Monitor BMET intermittently Monitor I/Os Correct electrolytes as indicated Further mgmt per TCTS  GASTROINTESTINAL A:   Post extubation dysphagia P:   SUP: changed PPI to IV post extubation NPO post extubation  HEMATOLOGIC A:   Post op anemia Post op thrombocytopenia, resolved P:  DVT px: full anticoagulation with bivalirudin Monitor CBC intermittently Transfuse per usual ICU guidelines  INFECTIOUS A:   Concern for PNA P:   Micro and abx as above Decisions re: duration of ceftaz per TCTS.  ENDOCRINE A:   DM P:   Cont Lantus at 10 units daily while NPO Cont SSI  NEUROLOGIC A:   Acute Encephalopathy, improving  Post op and ICU associated discomfort P:   RASS goal: 0. Cont low dose PRN fentanyl   PCCM will sign off. Please call if we can be of further assistance    Billy Fischeravid Simonds, MD ; [facility] Mobile [phone].  After 5:30 PM or weekends, call [phone]

## 2014-02-08 NOTE — Progress Notes (Signed)
TCTS BRIEF SICU PROGRESS NOTE  14 Days Post-Op  S/P Procedure(s) (LRB): CORONARY ARTERY BYPASS GRAFTING TIMES FOUR USING LEFT INTERNAL MAMMARY ARTERY TO LAD, SAPHENOUS VEIN GRAFTS TO OM1, OM2, AND PDA (N/A) MITRAL VALVE (MV) REPLACEMENT (N/A)   Stable day Converted back into sinus brady, now pacing for HR O2 sats stable on 3 L/min via [place] UOP adequate  Plan: Continue current plan  OWEN,[name] 02/08/2014  7:28 PM

## 2014-02-08 NOTE — Progress Notes (Signed)
ANTICOAGULATION CONSULT NOTE - Follow Up Consult  Pharmacy Consult for Bivalirudin Indication: MVR  No Known Allergies  Patient Measurements: Height: 5\' 7"  (170.2 cm) Weight: 229 lb 0.9 oz (103.9 kg) IBW/kg (Calculated) : 61.6  Vital Signs: Temp: 98.2 F (36.8 C) (07/26 1700) Temp src: Core (Comment) (07/26 1600) BP: 85/65 mmHg (07/26 1700) Pulse Rate: 126 (07/26 1700)  Labs:  Recent Labs  02/06/14 0435 02/07/14 0400 02/07/14 2200 02/08/14 0328 02/08/14 0330 02/08/14 0730 02/08/14 1600  HGB 7.5* 7.8*  --  11.9* 10.3*  --   --   HCT 23.5* 24.2*  --  35.0* 31.8*  --   --   PLT 307 397  --   --  374  --   --   APTT 59* 64* 94*  --   --  115* 88*  CREATININE 1.42* 1.69*  --  2.30* 1.92*  --   --     Estimated Creatinine Clearance: 31.9 ml/min (by C-G formula based on Cr of 1.92).   Assessment: PTT now therapeutic at 88 seconds   Goal of Therapy:  aPTT 50-90 seconds Monitor platelets by anticoagulation protocol: Yes   Plan:  -Continue bivalirudin to 0.035 mg/kg/hr -Daily CBC/aPTT -Monitor for bleeding  Thank you. Okey Regal, PharmD [phone]  Elwin Sleight 02/08/2014,6:05 PM

## 2014-02-08 NOTE — Progress Notes (Addendum)
      [address]       [place] [address]             [phone]        CARDIOTHORACIC SURGERY PROGRESS NOTE   R14 Days Post-Op Procedure(s) (LRB): CORONARY ARTERY BYPASS GRAFTING TIMES FOUR USING LEFT INTERNAL MAMMARY ARTERY TO LAD, SAPHENOUS VEIN GRAFTS TO OM1, OM2, AND PDA (N/A) MITRAL VALVE (MV) REPLACEMENT (N/A)  Subjective: Awake and alert.  More interactive today.  Denies pain, SOB.  Still somewhat lethargic  Objective: Vital signs: BP Readings from Last 1 Encounters:  02/08/14 90/62   Pulse Readings from Last 1 Encounters:  02/08/14 130   Resp Readings from Last 1 Encounters:  02/08/14 32   Temp Readings from Last 1 Encounters:  02/08/14 97.9 F (36.6 C)     Hemodynamics: CVP:  [13 mmHg-19 mmHg] 13 mmHg  Mixed venous co-ox 54%  Physical Exam:  Rhythm:   Afib w/ HR 100-110  Breath sounds: Remarkably clear  Heart sounds:  irreg  Incisions:  Clean and dry  Abdomen:  Soft, non-distended, non-tender  Extremities:  Warm, well-perfused    Intake/Output from previous day: 07/25 0701 - 07/26 0700 In: 1836.1 [I.V.:756.1; Blood:625; NG/GT:255; IV Piggyback:200] Out: 1125 [Urine:1125] Intake/Output this shift: Total I/O In: 160.5 [I.V.:110.5; IV Piggyback:50] Out: 175 [Urine:175]  Lab Results:  CBC: Recent Labs  02/07/14 0400 02/08/14 0328 02/08/14 0330  WBC 16.4*  --  16.0*  HGB 7.8* 11.9* 10.3*  HCT 24.2* 35.0* 31.8*  PLT 397  --  374    BMET:  Recent Labs  02/07/14 0400 02/08/14 0328 02/08/14 0330  NA 135* 131* 136*  K 3.7 3.9 4.1  CL 86* 86* 88*  CO2 36*  --  34*  GLUCOSE 142* 107* 92  BUN 78* 89* 88*  CREATININE 1.69* 2.30* 1.92*  CALCIUM 8.7  --  8.7     CBG (last 3)   Recent Labs  02/07/14 1943 02/08/14 0011 02/08/14 0853  GLUCAP 136* 109* 100*    ABG    Component Value Date/Time   PHART 7.499* 02/08/2014 0500   PCO2ART 45.3* 02/08/2014 0500   PO2ART 84.0 02/08/2014 0500   HCO3 35.3* 02/08/2014 0500    TCO2 37 02/08/2014 0500   ACIDBASEDEF 0.1 01/30/2014 0339   O2SAT 97.0 02/08/2014 0500    CXR: No film done today  Assessment/Plan: S/P Procedure(s) (LRB): CORONARY ARTERY BYPASS GRAFTING TIMES FOUR USING LEFT INTERNAL MAMMARY ARTERY TO LAD, SAPHENOUS VEIN GRAFTS TO OM1, OM2, AND PDA (N/A) MITRAL VALVE (MV) REPLACEMENT (N/A)  Overall stable from hemodynamic standpoint although back in Afib early this morning CVP low and co-ox increased considerably after transfusion Resp status stable since extubation Acute on chronic renal insufficiency, likely pre-renal azotemia Expected post op acute blood loss anemia, improved after transfusion yesterday Therapeutic on bivalirudin - probably not a good candidate for warfarin Leukocytosis w/out fever, still on IV Fortaz - no clinical signs of infection   Wean milrinone slowly  Restart IV amiodarone  Continue to hold on po's for now  Pulmonary toilet, avoid sedation  PT consult   OWEN,[name] 02/08/2014 9:50 AM

## 2014-02-08 NOTE — Progress Notes (Signed)
ANTICOAGULATION CONSULT NOTE - Follow Up Consult  Pharmacy Consult for Bivalirudin Indication: MVR  No Known Allergies  Patient Measurements: Height: 5\' 7"  (170.2 cm) Weight: 227 lb 9.6 oz (103.239 kg) IBW/kg (Calculated) : 61.6  Vital Signs: Temp: 98.1 F (36.7 C) (07/25 2300) Temp src: Core (Comment) (07/25 1900) BP: 112/66 mmHg (07/25 2300) Pulse Rate: 80 (07/25 2300)  Labs:  Recent Labs  02/05/14 0430 02/06/14 0435 02/07/14 0400 02/07/14 2200  HGB 8.0* 7.5* 7.8*  --   HCT 24.3* 23.5* 24.2*  --   PLT 266 307 397  --   APTT 59* 59* 64* 94*  CREATININE 1.26* 1.42* 1.69*  --     Estimated Creatinine Clearance: 36.1 ml/min (by C-G formula based on Cr of 1.69).   Medications:  Bivalirduin 0.048 mg/kg/hr  Assessment: Supra-therapeutic aPTT at 94 likely due to decreasing renal function.   Goal of Therapy:  aPTT 50-90 seconds Monitor platelets by anticoagulation protocol: Yes   Plan:  -Decrease bivalirudin to 0.035 mg/kg/hr -0400 aPTT -Daily CBC/aPTT -Monitor for bleeding  [name] 02/07/2014,11:53 PM

## 2014-02-09 ENCOUNTER — Inpatient Hospital Stay (HOSPITAL_COMMUNITY): Payer: Medicare Other

## 2014-02-09 ENCOUNTER — Encounter (HOSPITAL_COMMUNITY): Payer: Self-pay | Admitting: Radiology

## 2014-02-09 DIAGNOSIS — I4891 Unspecified atrial fibrillation: Secondary | ICD-10-CM

## 2014-02-09 DIAGNOSIS — N179 Acute kidney failure, unspecified: Secondary | ICD-10-CM | POA: Diagnosis present

## 2014-02-09 DIAGNOSIS — I219 Acute myocardial infarction, unspecified: Secondary | ICD-10-CM

## 2014-02-09 DIAGNOSIS — J9819 Other pulmonary collapse: Secondary | ICD-10-CM

## 2014-02-09 DIAGNOSIS — J9600 Acute respiratory failure, unspecified whether with hypoxia or hypercapnia: Secondary | ICD-10-CM

## 2014-02-09 LAB — CBC
HCT: 32 % — ABNORMAL LOW (ref 36.0–46.0)
Hemoglobin: 10.3 g/dL — ABNORMAL LOW (ref 12.0–15.0)
MCH: 29.9 pg (ref 26.0–34.0)
MCHC: 32.2 g/dL (ref 30.0–36.0)
MCV: 93 fL (ref 78.0–100.0)
Platelets: 429 10*3/uL — ABNORMAL HIGH (ref 150–400)
RBC: 3.44 MIL/uL — ABNORMAL LOW (ref 3.87–5.11)
RDW: 18.2 % — ABNORMAL HIGH (ref 11.5–15.5)
WBC: 17.1 10*3/uL — ABNORMAL HIGH (ref 4.0–10.5)

## 2014-02-09 LAB — CARBOXYHEMOGLOBIN
Carboxyhemoglobin: 1.1 % (ref 0.5–1.5)
Methemoglobin: 0.7 % (ref 0.0–1.5)
O2 Saturation: 43.4 %
Total hemoglobin: 10.2 g/dL — ABNORMAL LOW (ref 12.0–16.0)

## 2014-02-09 LAB — BASIC METABOLIC PANEL
Anion gap: 19 — ABNORMAL HIGH (ref 5–15)
BUN: 103 mg/dL — AB (ref 6–23)
CO2: 29 mEq/L (ref 19–32)
Calcium: 8.5 mg/dL (ref 8.4–10.5)
Chloride: 86 mEq/L — ABNORMAL LOW (ref 96–112)
Creatinine, Ser: 2.47 mg/dL — ABNORMAL HIGH (ref 0.50–1.10)
GFR calc Af Amer: 21 mL/min — ABNORMAL LOW (ref >90)
GFR, EST NON AFRICAN AMERICAN: 18 mL/min — AB (ref >90)
Glucose, Bld: 165 mg/dL — ABNORMAL HIGH (ref 70–99)
Potassium: 4.8 mEq/L (ref 3.7–5.3)
Sodium: 134 mEq/L — ABNORMAL LOW (ref 137–147)

## 2014-02-09 LAB — POCT I-STAT 3, ART BLOOD GAS (G3+)
Acid-Base Excess: 7 mmol/L — ABNORMAL HIGH (ref 0.0–2.0)
Bicarbonate: 30.3 mEq/L — ABNORMAL HIGH (ref 20.0–24.0)
O2 Saturation: 99 %
Patient temperature: 98.6
TCO2: 31 mmol/L (ref 0–100)
pCO2 arterial: 38.5 mmHg (ref 35.0–45.0)
pH, Arterial: 7.503 — ABNORMAL HIGH (ref 7.350–7.450)
pO2, Arterial: 114 mmHg — ABNORMAL HIGH (ref 80.0–100.0)

## 2014-02-09 LAB — GLUCOSE, CAPILLARY
Glucose-Capillary: 98 mg/dL (ref 70–99)
Glucose-Capillary: 127 mg/dL — ABNORMAL HIGH (ref 70–99)
Glucose-Capillary: 155 mg/dL — ABNORMAL HIGH (ref 70–99)
Glucose-Capillary: 146 mg/dL — ABNORMAL HIGH (ref 70–99)

## 2014-02-09 LAB — APTT: aPTT: 113 seconds — ABNORMAL HIGH (ref 24–37)

## 2014-02-09 MED ORDER — MILRINONE IN DEXTROSE 20 MG/100ML IV SOLN
0.2500 ug/kg/min | INTRAVENOUS | Status: DC
  Administered 2014-02-09 – 2014-02-18 (×19): 0.25 ug/kg/min via INTRAVENOUS
  Filled 2014-02-09 (×18): qty 100

## 2014-02-09 MED ORDER — VANCOMYCIN HCL 10 G IV SOLR
1500.0000 mg | INTRAVENOUS | Status: DC
  Administered 2014-02-09: 1500 mg via INTRAVENOUS
  Filled 2014-02-09 (×2): qty 1500

## 2014-02-09 MED ORDER — ACETYLCYSTEINE 20 % IN SOLN
4.0000 mL | RESPIRATORY_TRACT | Status: DC
  Administered 2014-02-09 – 2014-02-10 (×8): 4 mL via RESPIRATORY_TRACT
  Filled 2014-02-09 (×12): qty 4

## 2014-02-09 MED ORDER — MIDAZOLAM HCL 2 MG/2ML IJ SOLN
1.0000 mg | Freq: Once | INTRAMUSCULAR | Status: AC
  Administered 2014-02-09: 1 mg via INTRAVENOUS

## 2014-02-09 MED ORDER — BIOTENE DRY MOUTH MT LIQD
15.0000 mL | OROMUCOSAL | Status: DC
  Administered 2014-02-09 – 2014-02-11 (×18): 15 mL via OROMUCOSAL

## 2014-02-09 MED ORDER — ALBUMIN HUMAN 25 % IV SOLN
12.5000 g | Freq: Once | INTRAVENOUS | Status: AC
  Administered 2014-02-09: 12.5 g via INTRAVENOUS
  Filled 2014-02-09: qty 50

## 2014-02-09 MED ORDER — BIOTENE DRY MOUTH MT LIQD: 15.0000 mL | OROMUCOSAL | Status: DC

## 2014-02-09 MED ORDER — MIDAZOLAM HCL 2 MG/2ML IJ SOLN
INTRAMUSCULAR | Status: AC
  Administered 2014-02-09: 1 mg via INTRAVENOUS
  Filled 2014-02-09: qty 4

## 2014-02-09 MED ORDER — FENTANYL CITRATE 0.05 MG/ML IJ SOLN
100.0000 ug | Freq: Once | INTRAMUSCULAR | Status: AC
  Administered 2014-02-09: 100 ug via INTRAVENOUS

## 2014-02-09 MED ORDER — SODIUM CHLORIDE 0.9 % IV BOLUS (SEPSIS)
500.0000 mL | Freq: Once | INTRAVENOUS | Status: AC
  Administered 2014-02-09: 500 mL via INTRAVENOUS

## 2014-02-09 MED ORDER — FENTANYL CITRATE 0.05 MG/ML IJ SOLN
INTRAMUSCULAR | Status: AC
  Administered 2014-02-09: 100 ug via INTRAVENOUS
  Filled 2014-02-09: qty 4

## 2014-02-09 MED ORDER — ENOXAPARIN SODIUM 30 MG/0.3ML ~~LOC~~ SOLN
30.0000 mg | SUBCUTANEOUS | Status: DC
  Administered 2014-02-09: 30 mg via SUBCUTANEOUS
  Filled 2014-02-09 (×2): qty 0.3

## 2014-02-09 MED ORDER — NOREPINEPHRINE BITARTRATE 1 MG/ML IV SOLN
2.0000 ug/min | INTRAVENOUS | Status: DC
  Administered 2014-02-10: 8 ug/min via INTRAVENOUS
  Filled 2014-02-09 (×2): qty 4

## 2014-02-09 MED ORDER — SODIUM CHLORIDE 0.9 % IV SOLN
0.0100 mg/kg/h | INTRAVENOUS | Status: DC
  Filled 2014-02-09: qty 250

## 2014-02-09 MED ORDER — ETOMIDATE 2 MG/ML IV SOLN
20.0000 mg | Freq: Once | INTRAVENOUS | Status: AC
  Administered 2014-02-09: 20 mg via INTRAVENOUS

## 2014-02-09 MED ORDER — ACETYLCYSTEINE 20 % IN SOLN
4.0000 mL | Freq: Two times a day (BID) | RESPIRATORY_TRACT | Status: DC
  Administered 2014-02-09: 4 mL via RESPIRATORY_TRACT
  Filled 2014-02-09 (×3): qty 4

## 2014-02-09 MED ORDER — PHENYLEPHRINE HCL 10 MG/ML IJ SOLN
30.0000 ug/min | INTRAMUSCULAR | Status: DC
  Filled 2014-02-09: qty 1

## 2014-02-09 MED ORDER — LEVALBUTEROL HCL 1.25 MG/0.5ML IN NEBU
1.2500 mg | INHALATION_SOLUTION | Freq: Two times a day (BID) | RESPIRATORY_TRACT | Status: DC
  Administered 2014-02-09: 1.25 mg via RESPIRATORY_TRACT
  Filled 2014-02-09 (×3): qty 0.5

## 2014-02-09 MED ORDER — LEVALBUTEROL HCL 1.25 MG/0.5ML IN NEBU
1.2500 mg | INHALATION_SOLUTION | RESPIRATORY_TRACT | Status: DC
  Administered 2014-02-09 – 2014-02-14 (×30): 1.25 mg via RESPIRATORY_TRACT
  Filled 2014-02-09 (×36): qty 0.5

## 2014-02-09 MED ORDER — CEFTAZIDIME 1 G IJ SOLR
1.0000 g | INTRAMUSCULAR | Status: DC
  Filled 2014-02-09: qty 1

## 2014-02-09 MED ORDER — ALBUMIN HUMAN 5 % IV SOLN
12.5000 g | Freq: Once | INTRAVENOUS | Status: AC
  Administered 2014-02-09: 12.5 g via INTRAVENOUS
  Filled 2014-02-09: qty 250

## 2014-02-09 MED ORDER — NEPRO/CARBSTEADY PO LIQD
1000.0000 mL | ORAL | Status: DC
  Administered 2014-02-09: 1000 mL via ORAL
  Filled 2014-02-09 (×2): qty 1000

## 2014-02-09 MED ORDER — ROCURONIUM BROMIDE 50 MG/5ML IV SOLN
60.0000 mg | Freq: Once | INTRAVENOUS | Status: AC
  Administered 2014-02-09: 60 mg via INTRAVENOUS

## 2014-02-09 NOTE — Progress Notes (Signed)
Events of today noted  reintubated for acute on chronic respiratory failure  Had CT placed in IR- 1.3 L of fluid evacuated  BP 95/57  Pulse 88  Temp(Src) 97.5 F (36.4 C) (Core (Comment))  Resp 21  Ht 5\' 7"  (1.702 m)  Wt 231 lb 11.3 oz (105.1 kg)  BMI 36.28 kg/m2  SpO2 99%   Intake/Output Summary (Last 24 hours) at 02/09/14 1809 Last data filed at 02/09/14 1700  Gross per 24 hour  Intake 880.86 ml  Output    457 ml  Net 423.86 ml   Creatinine continues to rise despite restarting milrinone  Critically ill with multiple organ system dysfunction  Continue present care

## 2014-02-09 NOTE — Procedures (Signed)
Supervised and present through the entire procedure.  Greyson Peavy, MD Pulmonary and Critical Care Medicine [facility] Pager: [phone]  

## 2014-02-09 NOTE — Procedures (Signed)
Bronchoscopy Procedure Note [name] [id number] 28-Feb-1941  Procedure: Bronchoscopy Indications: Diagnostic evaluation of the airways  Procedure Details Consent: Risks of procedure as well as the alternatives and risks of each were explained to the (patient/caregiver).  Consent for procedure obtained. Time Out: Verified patient identification, verified procedure, site/side was marked, verified correct patient position, special equipment/implants available, medications/allergies/relevent history reviewed, required imaging and test results available.  Performed  In preparation for procedure, patient was given 100% FiO2. Sedation: Benzodiazepines, Muscle relaxants and Etomidate  Airway entered and the following bronchi were examined: RUL, RML, RLL, LUL, LLL and Bronchi.   Procedures performed: Inspection only. No significant mucous noted. Airways were clear w/ exception of what appeared to be suction trauma at the inlet of the RUL. The left side was extensively evaluated. The airways collapsed with inspiration. No mucous noted or significant secretions.  Bronchoscope removed.    Evaluation Hemodynamic Status: BP stable throughout; O2 sats: stable throughout Patient's Current Condition: stable Specimens:  None Complications: No apparent complications Patient did tolerate procedure well.   BABCOCK,PETE 02/09/2014

## 2014-02-09 NOTE — Progress Notes (Signed)
ANTICOAGULATION CONSULT NOTE - Follow Up Consult  Pharmacy Consult for Bivalirudin Indication: MVR  No Known Allergies  Patient Measurements: Height: 5\' 7"  (170.2 cm) Weight: 229 lb 0.9 oz (103.9 kg) IBW/kg (Calculated) : 61.6  Vital Signs: Temp: 98.6 F (37 C) (07/27 0400) Temp src: Oral (07/26 2000) BP: 89/56 mmHg (07/27 0400) Pulse Rate: 91 (07/27 0400)  Labs:  Recent Labs  02/07/14 0400  02/08/14 0328 02/08/14 0330 02/08/14 0730 02/08/14 1600 02/09/14 0330  HGB 7.8*  --  11.9* 10.3*  --   --  10.3*  HCT 24.2*  --  35.0* 31.8*  --   --  32.0*  PLT 397  --   --  374  --   --  429*  APTT 64*  < >  --   --  115* 88* 113*  CREATININE 1.69*  --  2.30* 1.92*  --   --  2.47*  < > = values in this interval not displayed.  Estimated Creatinine Clearance: 24.8 ml/min (by C-G formula based on Cr of 2.47).   Medications:  Bivalirduin 0.018 mg/kg/hr  Assessment: Supra-therapeutic aPTT at 113 likely due to accumulation from decreasing renal function  Goal of Therapy:  aPTT 50-90 seconds Monitor platelets by anticoagulation protocol: Yes   Plan:  -Hold x 1 hour -Re-start bivalirudin at 0.01 mg/kg/hr at 0615 -1000 aPTT -Daily CBC/aPTT -Monitor for bleeding  Abran Duke 02/09/2014,5:12 AM

## 2014-02-09 NOTE — Progress Notes (Addendum)
[facility] DAILY ICU PROGRESS NOTE                   [address]            [place] [id number]          [phone]   15 Days Post-Op Procedure(s) (LRB): CORONARY ARTERY BYPASS GRAFTING TIMES FOUR USING LEFT INTERNAL MAMMARY ARTERY TO LAD, SAPHENOUS VEIN GRAFTS TO OM1, OM2, AND PDA (N/A) MITRAL VALVE (MV) REPLACEMENT (N/A)  Total Length of Stay:  LOS: 15 days   Subjective: Awake and alert, not following commands.  Back on Bipap this am.  Objective: Vital signs in last 24 hours: Temp:  [97.3 F (36.3 C)-98.8 F (37.1 C)] 98.4 F (36.9 C) (07/27 0700) Pulse Rate:  [59-130] 88 (07/27 0700) Cardiac Rhythm:  [-] A-V Sequential paced (07/27 0200) Resp:  [26-38] 38 (07/27 0700) BP: (85-128)/(41-91) 96/60 mmHg (07/27 0748) SpO2:  [93 %-100 %] 100 % (07/27 0700) FiO2 (%):  [40 %] 40 % (07/27 0633) Weight:  [231 lb 11.3 oz (105.1 kg)] 231 lb 11.3 oz (105.1 kg) (07/27 0600)  Filed Weights   02/07/14 0400 02/08/14 0700 02/09/14 0600  Weight: 227 lb 9.6 oz (103.239 kg) 229 lb 0.9 oz (103.9 kg) 231 lb 11.3 oz (105.1 kg)    Weight change: 2 lb 10.3 oz (1.2 kg)   Hemodynamic parameters for last 24 hours: CVP:  [13 mmHg-15 mmHg] 15 mmHg  Intake/Output from previous day: 07/26 0701 - 07/27 0700 In: 954.5 [I.V.:854.5; IV Piggyback:100] Out: 765 [Urine:765]  CBGs 86-115-165-98     Current Meds: Scheduled Meds: . acetylcysteine  4 mL Nebulization BID  . antiseptic oral rinse  15 mL Mouth Rinse QID  . aspirin EC  325 mg Oral Daily  . bisacodyl  10 mg Oral Daily   Or  . bisacodyl  10 mg Rectal Daily  . cefTAZidime (FORTAZ)  IV  1 g Intravenous Q12H  . chlorhexidine  15 mL Mouth Rinse BID  . enoxaparin (LOVENOX) injection  30 mg Subcutaneous Q24H  . insulin aspart  0-24 Units Subcutaneous 6 times per day  . levalbuterol  1.25 mg Nebulization Q12H  . levothyroxine  37.5 mcg Intravenous QAC breakfast  . pantoprazole (PROTONIX) IV  40 mg Intravenous Q24H  . sodium  chloride  10-40 mL Intracatheter Q12H   Continuous Infusions: . sodium chloride Stopped (02/01/14 0830)  . sodium chloride 250 mL (02/05/14 2000)  . amiodarone 30 mg/hr (02/08/14 1923)  . milrinone     PRN Meds:.ondansetron (ZOFRAN) IV, sodium chloride  Physical Exam: General appearance: alert and no distress Heart: RRR, paced at 88 Lungs: Coarse bilateral rhonchi, R>L Abdomen: soft, non-tender; bowel sounds normal; no masses,  no organomegaly Extremities: Some LE edema, overall decreased Wound: Clean and dry except for small amount serosanguinous drainage from R lower leg wound, no erythema    Lab Results: CBC: Recent Labs  02/08/14 0330 02/09/14 0330  WBC 16.0* 17.1*  HGB 10.3* 10.3*  HCT 31.8* 32.0*  PLT 374 429*   BMET:  Recent Labs  02/08/14 0330 02/09/14 0330  NA 136* 134*  K 4.1 4.8  CL 88* 86*  CO2 34* 29  GLUCOSE 92 165*  BUN 88* 103*  CREATININE 1.92* 2.47*  CALCIUM 8.7 8.5    PT/INR: No results found for this basename: LABPROT, INR,  in the last 72 hours Radiology: Dg Chest Port 1 View  02/09/2014   CLINICAL DATA:  Respiratory  failure.  EXAM: PORTABLE CHEST - 1 VIEW  COMPARISON:  02/07/2014.  FINDINGS: Interim removal of endotracheal tube and NG tube. Right PICC line in stable position. Progressive opacification of the left hemi thorax is present. This could be from pulmonary infiltrate and/or effusion. Persistent right lower lobe infiltrate and right pleural effusion noted. Cardiomegaly. Prior median sternotomy. CABG. Cardiac valve replacement. No pneumothorax. No acute bony abnormality.  IMPRESSION: 1. PICC line in stable position. Interim removal of endotracheal tube and NGT. 2. Complete opacification of the left hemithorax. This could be from underlying pulmonary infiltrate and/or pleural effusion. 3. Persistent right pulmonary infiltrate and small right pleural effusion. 4. Cardiomegaly. Prior CABG and cardiac valve replacement. A component of congestive  heart failure may be present.   Electronically Signed   By: Maisie Fus  Register   On: 02/09/2014 07:48     Assessment/Plan: S/P Procedure(s) (LRB): CORONARY ARTERY BYPASS GRAFTING TIMES FOUR USING LEFT INTERNAL MAMMARY ARTERY TO LAD, SAPHENOUS VEIN GRAFTS TO OM1, OM2, AND PDA (N/A) MITRAL VALVE (MV) REPLACEMENT (N/A) CV- , Paced for bradycardia, BPs mostly stable. Co-ox 43.4, CVP 15. Off Milrinone for now, remains on Amio for recent AF. Pulm- Extubated over the weekend.  Presently on Bipap.  Continue pulm toilet, etc.  Appreciate CCM's assistance.  Nutrition- continue TFs, NPO for now.  Acute/chronic RI- Cr 1.92->2.47 today. BUN elevated, UOP decreased. Off Lasix gtt.  Monitor closely. Persistent leukocytosis, no fever. On Fortaz D#12 for presumed pneumonia.  Postop anemia- H/H stable after transfusion DM- sugars stable on Lantus.  Anticoagulation- Continue bivalirudin for prosthetic MV. Deconditioning- PT ordered.   COLLINS,GINA H 02/09/2014 7:55 AM  because am CXR shows prob obstructive atelectasis ol L lung I personally NTS'ed pt of large  Amount thick secretions and sent for culture. Patient cough too weak to clear secretions  A-V pacing- LV function normal by postop echo Following this procedure no imovement in breath sounds were noted and CCM called for bronchoscopy.  patient may need tracheostomy

## 2014-02-09 NOTE — Procedures (Signed)
Supervised and present through the entire procedure.  Jaielle Dlouhy, MD Pulmonary and Critical Care Medicine  [facility] Pager: [phone]  

## 2014-02-09 NOTE — Procedures (Signed)
Technically successful CT guided drainage catheter placement into left pleural space yielding 1.3 L of reddish brown pleural fluid.  Samples sent to laboratory.  No immediate post procedural complications.

## 2014-02-09 NOTE — Progress Notes (Signed)
PT Cancellation Note  Patient Details Name: [name] MRN: [id number] DOB: 1940-12-11   Cancelled Treatment:    Reason Eval/Treat Not Completed: Medical issues which prohibited therapy.  Nursing asked PT to hold therapy until Wednesday.  Pt had to be intubated for bronchoscopy today. Will check back Wednesday per nursing request.  Thanks.    INGOLD,Lillyanne Bradburn 02/09/2014, 12:37 PM [facility] [phone] (pager)

## 2014-02-09 NOTE — Consult Note (Signed)
HPI: [name] is an 73 y.o. female with recent CABG and MVR complicated by respiratory failure. Has been re-intubated. Large left pleural effusion noted on CXR. CT Chest has been ordered and IR is requested to place left chest tube as well. Chart, PMHx, current meds and labs reviewed. Imaging reviewed by Dr. Pascal Lux   Past Medical History:  Past Medical History  Diagnosis Date  . History of MI (myocardial infarction)     PCI done @ [facility] (cannot get cath report on Care Everywhere(  . Essential hypertension   . TIA (transient ischemic attack)   . Thyroid disease   . Anxiety   . Diabetes mellitus type 2 with peripheral artery disease     On insulin  . PAD (peripheral artery disease)   . Hyperlipidemia associated with type 2 diabetes mellitus     Surgical History:  Past Surgical History  Procedure Laterality Date  . Cardiac catheterization      PCI - unknown vessel or stent; unknown date; @ [facility]  . Abdominal hysterectomy    . Cholecystectomy    . Coronary artery bypass graft N/A 01/25/2014    Procedure: CORONARY ARTERY BYPASS GRAFTING TIMES FOUR USING LEFT INTERNAL MAMMARY ARTERY TO LAD, SAPHENOUS VEIN GRAFTS TO OM1, OM2, AND PDA;  Surgeon: Ivin Poot, MD;  Location: [place];  Service: Open Heart Surgery;  Laterality: N/A;  . Mitral valve replacement N/A 01/25/2014    Procedure: MITRAL VALVE (MV) REPLACEMENT;  Surgeon: Ivin Poot, MD;  Location: [place];  Service: Open Heart Surgery;  Laterality: N/A;    Family History:  Family History  Problem Relation Age of Onset  . Heart attack Mother   . Heart disease Mother   . Heart attack Father   . Heart disease Father     Social History:  reports that she has never smoked. She has never used smokeless tobacco. She reports that she does not drink alcohol or use illicit drugs.  Allergies: No Known Allergies  Medications: Current facility-administered medications:0.9 %  sodium chloride infusion, ,  Intravenous, Continuous, Janice Norrie, MD, 20 mL/hr at 01/25/14 1847;  0.9 %  sodium chloride infusion, 250 mL, Intravenous, Continuous, Erin Barrett, PA-C, Last Rate: 10 mL/hr at 02/05/14 2000, 250 mL at 02/05/14 2000;  acetylcysteine (MUCOMYST) 20 % nebulizer / oral solution 4 mL, 4 mL, Nebulization, 6 times per day, Doree Fudge, MD, 4 mL at 02/09/14 1128 amiodarone (NEXTERONE PREMIX) 360 MG/200ML (1.8 mg/mL) IV infusion, 30 mg/hr, Intravenous, Continuous, Rexene Alberts, MD, Last Rate: 16.7 mL/hr at 02/09/14 1000, 30 mg/hr at 02/09/14 1000;  antiseptic oral rinse (BIOTENE) solution 15 mL, 15 mL, Mouth Rinse, QID, Ivin Poot, MD, 15 mL at 02/09/14 0414;  aspirin EC tablet 325 mg, 325 mg, Oral, Daily, Erin Barrett, PA-C, 325 mg at 02/07/14 0946 bisacodyl (DULCOLAX) EC tablet 10 mg, 10 mg, Oral, Daily, Erin Barrett, PA-C, 10 mg at 02/07/14 0947;  bisacodyl (DULCOLAX) suppository 10 mg, 10 mg, Rectal, Daily, Erin Barrett, PA-C, 10 mg at 01/31/14 1019;  [START ON 02/10/2014] cefTAZidime (FORTAZ) 1 g in dextrose 5 % 50 mL IVPB, 1 g, Intravenous, Q24H, Rachel Lynn Rumbarger, RPH;  chlorhexidine (PERIDEX) 0.12 % solution 15 mL, 15 mL, Mouth Rinse, BID, Ivin Poot, MD, 15 mL at 02/09/14 0804 enoxaparin (LOVENOX) injection 30 mg, 30 mg, Subcutaneous, Q24H, Ivin Poot, MD;  insulin aspart (novoLOG) injection 0-24 Units, 0-24 Units, Subcutaneous, 6 times per day, Gaspar Bidding  Alveria Apley, MD, 4 Units at 02/09/14 0413;  levalbuterol [facility]) nebulizer solution 1.25 mg, 1.25 mg, Nebulization, 6 times per day, Doree Fudge, MD, 1.25 mg at 02/09/14 1128 levothyroxine (SYNTHROID, LEVOTHROID) injection 37.5 mcg, 37.5 mcg, Intravenous, QAC breakfast, Ivin Poot, MD, 37.5 mcg at 02/09/14 0803;  milrinone [facility]) infusion 200 mcg/mL (0.2 mg/ml), 0.25 mcg/kg/min, Intravenous, Continuous, Ivin Poot, MD, Last Rate: 7.9 mL/hr at 02/09/14 1000, 0.25 mcg/kg/min at 02/09/14  1000 norepinephrine (LEVOPHED) 4 mg in dextrose 5 % 250 mL infusion, 2-8 mcg/min, Intravenous, Titrated, Ivin Poot, MD;  ondansetron [facility]) injection 4 mg, 4 mg, Intravenous, Q6H PRN, Erin Barrett, PA-C;  pantoprazole (PROTONIX) injection 40 mg, 40 mg, Intravenous, Q24H, Wilhelmina Mcardle, MD, 40 mg at 02/09/14 1018;  sodium chloride 0.9 % bolus 500 mL, 500 mL, Intravenous, Once, Erick Colace, NP sodium chloride 0.9 % injection 10-40 mL, 10-40 mL, Intracatheter, Q12H, Ivin Poot, MD, 10 mL at 02/09/14 1014;  sodium chloride 0.9 % injection 10-40 mL, 10-40 mL, Intracatheter, PRN, Ivin Poot, MD  ROS: See HPI for pertinent findings, otherwise complete 10 system review negative.  Physical Exam: Blood pressure 86/60, pulse 88, temperature 97.7 F (36.5 C), temperature source Axillary, resp. rate 12, height _0  (1.702 m), weight 231 lb 11.3 oz (105.1 kg), SpO2 100.00%. Pt intubated and sedated. Coarse BS on (R), faint/dminished BS on left    Labs: CBC  Recent Labs  02/08/14 0330 02/09/14 0330  WBC 16.0* 17.1*  HGB 10.3* 10.3*  HCT 31.8* 32.0*  PLT 374 429*   MET  Recent Labs  02/08/14 0330 02/09/14 0330  NA 136* 134*  K 4.1 4.8  CL 88* 86*  CO2 34* 29  GLUCOSE 92 165*  BUN 88* 103*  CREATININE 1.92* 2.47*  CALCIUM 8.7 8.5    Recent Labs  02/08/14 0330  PROT 7.1  ALBUMIN 2.9*  AST 40*  ALT 27  ALKPHOS 165*  BILITOT 1.8*   PT/INR No results found for this basename: LABPROT, INR,  in the last 72 hours ABG  Recent Labs  02/08/14 0500 02/09/14 0453  PHART 7.499* 7.503*  HCO3 35.3* 30.3*      Dg Chest Port 1 View  02/09/2014   CLINICAL DATA:  Hypoxia  EXAM: PORTABLE CHEST - 1 VIEW  COMPARISON:  Study obtained earlier in the day  FINDINGS: Endotracheal tube tip is 3.5 cm above the carina. Central catheter tip is in the superior vena cava. No pneumothorax.  There is a sizable effusion on the left which appears slightly smaller compared to  earlier in the day. There is atelectatic change throughout much of the left lung is well. There may be superimposed airspace consolidation on the left. There is a much smaller effusion on the right, stable. Right lung is otherwise clear.  Heart is mildly enlarged. 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 No adenopathy is apparent. The pulmonary vascularity is within normal limits.  IMPRESSION: Tube and catheter positions as described without pneumothorax. Bilateral effusions, much larger on the left than on the right. Effusion on the left is slightly smaller compared to earlier in the day. There is extensive atelectatic change on the left. There may be some airspace consolidation superimposed with effusion on the left as well. No change in cardiac silhouette.   Electronically Signed   By: Lowella Grip M.D.   On: 02/09/2014 10:12   Dg Chest Port 1 View  02/09/2014  CLINICAL DATA:  Respiratory failure.  EXAM: PORTABLE CHEST - 1 VIEW  COMPARISON:  02/07/2014.  FINDINGS: Interim removal of endotracheal tube and NG tube. Right PICC line in stable position. Progressive opacification of the left hemi thorax is present. This could be from pulmonary infiltrate and/or effusion. Persistent right lower lobe infiltrate and right pleural effusion noted. Cardiomegaly. Prior median sternotomy. CABG. Cardiac valve replacement. No pneumothorax. No acute bony abnormality.  IMPRESSION: 1. PICC line in stable position. Interim removal of endotracheal tube and NGT. 2. Complete opacification of the left hemithorax. This could be from underlying pulmonary infiltrate and/or pleural effusion. 3. Persistent right pulmonary infiltrate and small right pleural effusion. 4. Cardiomegaly. Prior CABG and cardiac valve replacement. A component of congestive heart failure may be present.   Electronically Signed   By: Marcello Moores  Register   On: 02/09/2014 07:48    Assessment/Plan: Resp failure s/p  CABG/MVR Large left pleural effusion. For CT chest and image guided left chest tube placement. Explained procedure to daughter Hoyle Sauer' over telephone. Discussed risks, complications and expectations of tube. Consent obtained, in chart  Ascencion Dike PA-C 02/09/2014, 11:38 AM

## 2014-02-09 NOTE — Progress Notes (Signed)
ANTIBIOTIC CONSULT NOTE - INITIAL  Pharmacy Consult for vancomycin Indication: r/o sepsis  No Known Allergies  Patient Measurements: Height: 5\' 7"  (170.2 cm) Weight: 231 lb 11.3 oz (105.1 kg) IBW/kg (Calculated) : 61.6 Adjusted Body Weight:   Vital Signs: Temp: 97.8 F (36.6 C) (07/27 1900) Temp src: Other (Comment) (07/27 1900) BP: 93/60 mmHg (07/27 1900) Pulse Rate: 88 (07/27 1900) Intake/Output from previous day: 07/26 0701 - 07/27 0700 In: 954.5 [I.V.:854.5; IV Piggyback:100] Out: 765 [Urine:765] Intake/Output from this shift:    Labs:  Recent Labs  02/07/14 0400 02/08/14 0328 02/08/14 0330 02/09/14 0330  WBC 16.4*  --  16.0* 17.1*  HGB 7.8* 11.9* 10.3* 10.3*  PLT 397  --  374 429*  CREATININE 1.69* 2.30* 1.92* 2.47*   Estimated Creatinine Clearance: 24.9 ml/min (by C-G formula based on Cr of 2.47). No results found for this basename: VANCOTROUGH, Leodis BinetVANCOPEAK, VANCORANDOM, GENTTROUGH, GENTPEAK, GENTRANDOM, TOBRATROUGH, TOBRAPEAK, TOBRARND, AMIKACINPEAK, AMIKACINTROU, AMIKACIN,  in the last 72 hours   Microbiology: Recent Results (from the past 720 hour(s))  MRSA PCR SCREENING     Status: None   Collection Time    01/26/14 11:10 AM      Result Value Ref Range Status   MRSA by PCR NEGATIVE  NEGATIVE Final   Comment:            The GeneXpert MRSA Assay (FDA     approved for NASAL specimens     only), is one component of a     comprehensive MRSA colonization     surveillance program. It is not     intended to diagnose MRSA     infection nor to guide or     monitor treatment for     MRSA infections.  CULTURE, RESPIRATORY (NON-EXPECTORATED)     Status: None   Collection Time    01/27/14  9:53 AM      Result Value Ref Range Status   Specimen Description TRACHEAL ASPIRATE   Final   Special Requests Normal   Final   Gram Stain     Final   Value: FEW WBC PRESENT,BOTH PMN AND MONONUCLEAR     NO SQUAMOUS EPITHELIAL CELLS SEEN     NO ORGANISMS SEEN      [facility]   Culture     Final   Value: NO GROWTH 2 DAYS     [facility]formed at [facility]   Report Status 01/29/2014 FINAL   Final  URINE CULTURE     Status: None   Collection Time    02/02/14 12:19 PM      Result Value Ref Range Status   Specimen Description URINE, CATHETERIZED   Final   Special Requests Normal   Final   Culture  Setup Time     Final   Value: 02/02/2014 19:42     [facility]formed at [facility]   Colony Count     Final   Value: NO GROWTH     Performed at [facility]   Culture     Final   Value: NO GROWTH     Performed at [facility]   Report Status 02/03/2014 FINAL   Final  CLOSTRIDIUM DIFFICILE BY PCR     Status: None   Collection Time    02/05/14  5:18 PM      Result Value Ref Range Status   C difficile by pcr NEGATIVE  NEGATIVE Final  AFB CULTURE WITH SMEAR  Status: None   Collection Time    02/06/14  2:45 PM      Result Value Ref Range Status   Specimen Description FLUID PLEURAL   Final   Special Requests NONE   Final   Acid Fast Smear     Final   Value: NO ACID FAST BACILLI SEEN     Performed at [facility]   Culture     Final   Value: CULTURE WILL BE EXAMINED FOR 6 WEEKS BEFORE ISSUING A FINAL REPORT     Performed at [facility]   Report Status PENDING   Incomplete  BODY FLUID CULTURE     Status: None   Collection Time    02/06/14  2:45 PM      Result Value Ref Range Status   Specimen Description PLEURAL FLUID   Final   Special Requests NONE   Final   Gram Stain     Final   Value: NO WBC SEEN     NO ORGANISMS SEEN     Performed at [facility]   Culture     Final   Value: NO GROWTH 3 DAYS     Performed at [facility]   Report Status PENDING   Incomplete  FUNGUS CULTURE W SMEAR     Status: None   Collection Time    02/06/14  2:46 PM      Result Value Ref Range Status   Specimen Description FLUID PLEURAL   Final   Special Requests NONE   Final   Fungal  Smear     Final   Value: NO YEAST OR FUNGAL ELEMENTS SEEN     Performed at [facility]   Culture     Final   Value: CULTURE IN PROGRESS FOR FOUR WEEKS     Performed at [facility]   Report Status PENDING   Incomplete    Medical History: Past Medical History  Diagnosis Date  . History of MI (myocardial infarction)     PCI done @ [facility] (cannot get cath report on Care Everywhere(  . Essential hypertension   . TIA (transient ischemic attack)   . Thyroid disease   . Anxiety   . Diabetes mellitus type 2 with peripheral artery disease     On insulin  . PAD (peripheral artery disease)   . Hyperlipidemia associated with type 2 diabetes mellitus   . A-fib   . ARF (acute renal failure)     Medications:  Scheduled:  . acetylcysteine  4 mL Nebulization 6 times per day  . albumin human  12.5 g Intravenous Once  . antiseptic oral rinse  15 mL Mouth Rinse Q2H  . aspirin EC  325 mg Oral Daily  . bisacodyl  10 mg Oral Daily   Or  . bisacodyl  10 mg Rectal Daily  . [START ON 02/10/2014] cefTAZidime (FORTAZ)  IV  1 g Intravenous Q24H  . chlorhexidine  15 mL Mouth Rinse BID  . enoxaparin (LOVENOX) injection  30 mg Subcutaneous Q24H  . insulin aspart  0-24 Units Subcutaneous 6 times per day  . levalbuterol  1.25 mg Nebulization 6 times per day  . levothyroxine  37.5 mcg Intravenous QAC breakfast  . pantoprazole (PROTONIX) IV  40 mg Intravenous Q24H  . sodium chloride  10-40 mL Intracatheter Q12H   Infusions:  . sodium chloride Stopped (02/01/14 0830)  . sodium chloride 250 mL (02/05/14 2000)  . amiodarone 20 mg/hr (  02/09/14 1900)  . feeding supplement (NEPRO CARB STEADY)    . milrinone 0.25 mcg/kg/min (02/09/14 1900)  . norepinephrine (LEVOPHED) Adult infusion Stopped (02/09/14 1154)   Assessment: 73 yo female with r/o sepsis will be started on vancomycin therapy.  SCr is rising; now at 2.47 (CrCl <30)  Goal of Therapy:  Vancomycin trough level 15-20  mcg/ml  Plan:  1) vancomycin 1500mg  iv q48h. 2) Monitor renal function closely 3) check vancomycin trough when it's appropriate.  Austyn Perriello, Tsz-Yin 02/09/2014,7:11 PM

## 2014-02-09 NOTE — Progress Notes (Signed)
SUBJECTIVE:  No further AFib since yesterday.  Reintubated this AM.  OBJECTIVE:   Vitals:   Filed Vitals:   02/09/14 1015 02/09/14 1030 02/09/14 1129 02/09/14 1130  BP: 92/61 86/60    Pulse: 88 88    Temp: 98.1 F (36.7 C) 98 F (36.7 C)  97.7 F (36.5 C)  TempSrc: Other (Comment) Other (Comment)  Axillary  Resp: 17 12    Height:      Weight:      SpO2: 100% 100% 100%    I&O's:   Intake/Output Summary (Last 24 hours) at 02/09/14 1154 Last data filed at 02/09/14 1100  Gross per 24 hour  Intake 856.67 ml  Output    562 ml  Net 294.67 ml   TELEMETRY: Reviewed telemetry pt in paced  rhythm:     PHYSICAL EXAM General: Intubated sedated Head:   Normal cephalic and atramatic  Lungs:   Vent Heart: HRRR S1 S2  No JVD.   Abdomen: abdomen soft and non-tender Msk:  Back normal,  Normal strength and tone for age. Extremities:  Bilateral edema.   Neuro:  Intubated sedated Psych:   Intubated sedated   LABS: Basic Metabolic Panel:  Recent Labs  84/13/2407/25/15 0400  02/08/14 0330 02/09/14 0330  NA 135*  < > 136* 134*  K 3.7  < > 4.1 4.8  CL 86*  < > 88* 86*  CO2 36*  --  34* 29  GLUCOSE 142*  < > 92 165*  BUN 78*  < > 88* 103*  CREATININE 1.69*  < > 1.92* 2.47*  CALCIUM 8.7  --  8.7 8.5  MG 2.1  --   --   --   PHOS 4.2  --   --   --   < > = values in this interval not displayed. Liver Function Tests:  Recent Labs  02/07/14 0400 02/08/14 0330  AST 35 40*  ALT 24 27  ALKPHOS 146* 165*  BILITOT 1.5* 1.8*  PROT 7.0 7.1  ALBUMIN 2.9* 2.9*   No results found for this basename: LIPASE, AMYLASE,  in the last 72 hours CBC:  Recent Labs  02/08/14 0330 02/09/14 0330  WBC 16.0* 17.1*  HGB 10.3* 10.3*  HCT 31.8* 32.0*  MCV 94.4 93.0  PLT 374 429*   Cardiac Enzymes: No results found for this basename: CKTOTAL, CKMB, CKMBINDEX, TROPONINI,  in the last 72 hours BNP: No components found with this basename: POCBNP,  D-Dimer: No results found for this basename:  DDIMER,  in the last 72 hours Hemoglobin A1C: No results found for this basename: HGBA1C,  in the last 72 hours Fasting Lipid Panel:  Recent Labs  02/06/14 1500  CHOL 55   Thyroid Function Tests: No results found for this basename: TSH, T4TOTAL, FREET3, T3FREE, THYROIDAB,  in the last 72 hours Anemia Panel: No results found for this basename: VITAMINB12, FOLATE, FERRITIN, TIBC, IRON, RETICCTPCT,  in the last 72 hours Coag Panel:   Lab Results  Component Value Date   INR 1.73* 01/26/2014   INR 1.15 01/25/2014   INR 1.05 01/25/2014    RADIOLOGY: Dg Chest Port 1 View  02/09/2014   CLINICAL DATA:  Hypoxia  EXAM: PORTABLE CHEST - 1 VIEW  COMPARISON:  Study obtained earlier in the day  FINDINGS: Endotracheal tube tip is 3.5 cm above the carina. Central catheter tip is in the superior vena cava. No pneumothorax.  There is a sizable effusion on the left which appears slightly  smaller compared to earlier in the day. There is atelectatic change throughout much of the left lung is well. There may be superimposed airspace consolidation on the left. There is a much smaller effusion on the right, stable. Right lung is otherwise clear.  Heart is mildly enlarged. 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 No adenopathy is apparent. The pulmonary vascularity is within normal limits.  IMPRESSION: Tube and catheter positions as described without pneumothorax. Bilateral effusions, much larger on the left than on the right. Effusion on the left is slightly smaller compared to earlier in the day. There is extensive atelectatic change on the left. There may be some airspace consolidation superimposed with effusion on the left as well. No change in cardiac silhouette.   Electronically Signed   By: Bretta Bang M.D.   On: 02/09/2014 10:12   Dg Chest Port 1 View  02/09/2014   CLINICAL DATA:  Respiratory failure.  EXAM: PORTABLE CHEST - 1 VIEW  COMPARISON:  02/07/2014.   FINDINGS: Interim removal of endotracheal tube and NG tube. Right PICC line in stable position. Progressive opacification of the left hemi thorax is present. This could be from pulmonary infiltrate and/or effusion. Persistent right lower lobe infiltrate and right pleural effusion noted. Cardiomegaly. Prior median sternotomy. CABG. Cardiac valve replacement. No pneumothorax. No acute bony abnormality.  IMPRESSION: 1. PICC line in stable position. Interim removal of endotracheal tube and NGT. 2. Complete opacification of the left hemithorax. This could be from underlying pulmonary infiltrate and/or pleural effusion. 3. Persistent right pulmonary infiltrate and small right pleural effusion. 4. Cardiomegaly. Prior CABG and cardiac valve replacement. A component of congestive heart failure may be present.   Electronically Signed   By: Maisie Fus  Register   On: 02/09/2014 07:48   Dg Chest Port 1 View  02/07/2014   CLINICAL DATA:  Pneumonia.  EXAM: PORTABLE CHEST - 1 VIEW  COMPARISON:  02/06/2014.  FINDINGS: The endotracheal tube, NG tube and right PICC line are stable. Persistent mild cardiac enlargement with pulmonary edema, pleural effusions and areas of atelectasis.  IMPRESSION: Stable support apparatus.  Persistent edema, atelectasis and effusions.   Electronically Signed   By: Loralie Champagne M.D.   On: 02/07/2014 08:37   Dg Chest Port 1 View  02/06/2014   CLINICAL DATA:  Status post right thoracentesis  EXAM: PORTABLE CHEST - 1 VIEW  COMPARISON:  02/06/2014  FINDINGS: Resolution of the right effusion following thoracentesis. No pneumothorax. Posterior layering moderate left effusion remains. Stable support apparatus. Endotracheal tube 6 cm above the carina. Prior coronary bypass and aortic valve replacement. Atherosclerosis of the aorta.  IMPRESSION: Resolved right effusion following thoracentesis.  No pneumothorax.  Persistent left effusion.   Electronically Signed   By: Ruel Favors M.D.   On: 02/06/2014 15:49    Dg Chest Port 1 View  02/06/2014   CLINICAL DATA:  Evaluate endotracheal tube.  EXAM: PORTABLE CHEST - 1 VIEW  COMPARISON:  02/05/2014  FINDINGS: Endotracheal tube is 4.2 cm above the carina. Nasogastric tube extends into the abdomen. Prominent vascular structures with diffuse lung densities. Persistent basilar densities which are incompletely evaluated. Heart size is within normal limits. PICC line tip is in the lower SVC region. Feeding tube extends into the abdomen.  IMPRESSION: Diffuse lung densities are suggestive for pulmonary edema.  Persistent bibasilar densities probably represent a combination of atelectasis and pleural fluid.  Support apparatuses as described.   Electronically Signed   By: Madelaine Bhat  Lowella Dandy M.D.   On: 02/06/2014 07:48   Dg Chest Port 1 View  02/05/2014   CLINICAL DATA:  Status post coronary artery bypass graft.  EXAM: PORTABLE CHEST - 1 VIEW  COMPARISON:  February 04, 2014.  FINDINGS: Stable cardiomediastinal silhouette. Endotracheal tube is noted with distal tip 5 cm above the carina. Nasogastric tube is seen entering the stomach. Status post coronary artery bypass graft. Bilateral pulmonary edema appears improved. Stable bilateral pleural effusions are noted. Swan-Ganz catheter has been removed. No pneumothorax is noted.  IMPRESSION: Endotracheal tube in grossly good position. Improved bilateral pulmonary edema with stable bilateral pleural effusions.   Electronically Signed   By: Roque Lias M.D.   On: 02/05/2014 08:01   Dg Chest Port 1 View  02/04/2014   CLINICAL DATA:  Status post CABG  EXAM: PORTABLE CHEST - 1 VIEW  COMPARISON:  Portable chest x-ray of February 03, 2014  FINDINGS: Positioning is limited on today's study. The pulmonary interstitial markings remain increased. The right hemidiaphragm remains obscured. The left hemidiaphragm is excluded from the image. The cardiac silhouette is mildly enlarged and the pulmonary vascularity remains engorged.  The endotracheal tube tip lies  4.7 cm above the crotch of the carina. The Swan-Ganz catheter tip is in the proximal right main pulmonary artery. The intra-aortic balloon pump marker lies just below the aortic knob. Faintly demonstrated is a mediastinal drain on the left. The esophagogastric tube tip projects off the image.  IMPRESSION: Stable appearance of the lungs consistent with interstitial edema, bibasilar atelectasis, and bilateral pleural effusions. The support tubes and lines are in appropriate position.   Electronically Signed   By: David  [place]   On: 02/04/2014 07:56   Dg Chest Port 1 View  02/03/2014   CLINICAL DATA:  Status post CABG and mitral valve replacement  EXAM: PORTABLE CHEST - 1 VIEW  COMPARISON:  Portable chest x-ray of February 02, 2014  FINDINGS: The cardiopericardial silhouette is mildly enlarged though stable. The mitral valve ring is visible. The pulmonary vascularity is more prominent today. The interstitial markings are slightly more prominent as in both lungs. The hemidiaphragms remain obscured.  The endotracheal tube tip lies approximately 4 cm above the crotch of the carina. The esophagogastric tube tip projects off the inferior margin of the image. The Swan-Ganz type catheter tip lies in the proximal to midportion of the right main pulmonary artery. The PICC line on the right has its tip in the midportion of the SVC.  IMPRESSION: There has been slight interval increase in the pulmonary interstitial edema. Small bilateral pleural effusions persist.   Electronically Signed   By: David  [place]   On: 02/03/2014 07:36   Dg Chest Port 1 View  02/02/2014   CLINICAL DATA:  Status post CABG and mitral valve repair.  EXAM: PORTABLE CHEST - 1 VIEW  COMPARISON:  Single view of the chest 02/01/2014 and 01/31/2014.  FINDINGS: Support tubes and lines are unchanged. Bilateral effusions and basilar airspace disease have worsened. Cardiomegaly and pulmonary edema are again seen.  IMPRESSION: Increased bilateral effusions and  basilar airspace disease.  Pulmonary edema persists.   Electronically Signed   By: Drusilla Kanner M.D.   On: 02/02/2014 07:57   Dg Chest Port 1 View  02/01/2014   CLINICAL DATA:  Respiratory failure and status post CABG and mitral valve replacement.  EXAM: PORTABLE CHEST - 1 VIEW  COMPARISON:  01/31/2014  FINDINGS: Endotracheal tube remains with tip approximately 5 cm above the carina.  Swan-Ganz catheter positioning is stable in the right pulmonary artery. Right PICC line extends into SVC. Lungs show slight improvement in left lower lobe atelectasis. There remains a small left pleural effusion. Mild interstitial edema is stable. The heart size and mediastinal contours are stable. No pneumothorax.  IMPRESSION: Slight diminishment in left lower lobe atelectasis. Stable mild interstitial edema and small left pleural effusion.   Electronically Signed   By: Irish Lack M.D.   On: 02/01/2014 08:23   Dg Chest Port 1 View  01/31/2014   CLINICAL DATA:  Status post cardiac surgery  EXAM: PORTABLE CHEST - 1 VIEW  COMPARISON:  01/30/2014  FINDINGS: Hazy lung base opacity consistent with a combination of effusions and atelectasis, stable. Mild central vascular prominence is also stable. No overt pulmonary edema. No gross pneumothorax on this semi-erect study.  Cardiac silhouette is mildly enlarged.  No mediastinal widening.  Endotracheal tube, right internal jugular Swan-Ganz catheter, nasogastric tube and right PICC are stable in well positioned. Left chest tube has been removed. New enteric tube passes below the diaphragm into the stomach and below the included field of view.  IMPRESSION: 1. Persistent small pleural effusions with lung base atelectasis. No overt edema. No mediastinal widening. No gross pneumothorax. 2. Status post removal of the left chest tube. New enteric tube passes at least into the distal stomach. 3. No other change.   Electronically Signed   By: Amie Portland M.D.   On: 01/31/2014 07:59    Dg Chest Port 1 View  01/30/2014   CLINICAL DATA:  Status post emergency CABG  EXAM: PORTABLE CHEST - 1 VIEW  COMPARISON:  Portable chest x-ray of January 29, 2014  FINDINGS: The lungs are reasonably well inflated. The left hemidiaphragm remains obscured. The cardiac silhouette is normal in size. A prosthetic mitral valve ring. The pulmonary vascularity is mildly prominent centrally but has decreased in conspicuity since yesterday's study.  The endotracheal tube tip lies 5.6 cm above the crotch of the carina. The esophagogastric tube tip projects off the inferior margin of the image. The Swan-Ganz catheter tip projecting in the region of the proximal right pulmonary artery. The intra-aortic balloon pump marker lies cm below the aortic arch. The left-sided chest tube is stable with the tip projecting between the fourth and fifth posterior ribs.  IMPRESSION: There has been slight interval improvement in the appearance of the pulmonary interstitium since yesterday's study. The support tubes and lines are in appropriate position.   Electronically Signed   By: David  [place]   On: 01/30/2014 08:20   Dg Chest Port 1 View  01/29/2014   CLINICAL DATA:  Status post coronary artery bypass graft.  EXAM: PORTABLE CHEST - 1 VIEW  COMPARISON:  January 28, 2014.  FINDINGS: Stable cardiomegaly. Status post coronary artery bypass graft. Left-sided chest tube is noted without pneumothorax. Endotracheal and nasogastric tubes are in grossly good position. Right internal jugular Swan-Ganz catheter is noted with tip directed toward right pulmonary artery. Minimal bilateral pleural effusions are noted. Minimal bilateral basilar subsegmental atelectasis is noted as well. Status post cardiac valve repair.  IMPRESSION: No pneumothorax seen status post coronary artery bypass graft. Stable support apparatus.   Electronically Signed   By: Roque Lias M.D.   On: 01/29/2014 07:43   Dg Chest Port 1 View  01/28/2014   CLINICAL DATA:   73 year old female status post CABG, mitral valve replacement. Initial encounter.  EXAM: PORTABLE CHEST - 1 VIEW  COMPARISON:  01/27/2014 and earlier.  FINDINGS: Portable AP semi upright view at 0613 hrs. Stable lines and tubes, including endotracheal tube tip at the level the clavicles, right IJ approach Swan-Ganz catheter tip at the level of the right main pulmonary artery, and left chest tube. No pneumothorax. Stable cardiac size and mediastinal contours.  Stable veiling bibasilar opacity and confluent retrocardiac opacity. Decreased vascular congestion. No areas of worsening ventilation.  IMPRESSION: 1.  Stable lines and tubes. 2. No pneumothorax.  Decreased vascular congestion. 3. Small bilateral pleural effusions and lower lobe collapse or consolidation.   Electronically Signed   By: Augusto Gamble M.D.   On: 01/28/2014 07:58   Dg Chest Port 1 View  01/27/2014   CLINICAL DATA:  PICC line placement.  EXAM: PORTABLE CHEST - 1 VIEW  COMPARISON:  Chest radiograph January 27, 2014  FINDINGS: Interval placement of right peripherally inserted central catheter, distal tip projecting at cavoatrial junction. No pneumothorax.  Endotracheal tube tip projects 5.2 cm above the carina. Swan-Ganz catheter via right internal jugular venous approach with distal tip projecting in right main pulmonary artery. Aortic balloon pump in place with marker medially below the aortic arch which is mildly calcified. The left apical chest tube. Nasogastric tube past the GE junction, with side port projecting within the proximal stomach, distal tip not imaged.  The cardiac silhouette is mildly enlarged, status post median sternotomy for cardiac valve replacement, with possible CABG. Similar interstitial prominence, bibasilar airspace opacities and small pleural effusions.  Multiple EKG lines overlie the patient and may obscure subtle underlying pathology. Calcifications in left side may be vascular.  IMPRESSION: Interval placement of right PICC,  distal tip projecting at cavoatrial junction. No pneumothorax. No apparent change remaining life support lines.  Stable mild cardiomegaly, interstitial prominence likely reflects pulmonary edema with bibasilar airspace opacities most consistent with confluent edema, small pleural effusions.  These results will be called to the ordering clinician or representative by the Radiologist Assistant, and communication documented in the PACS or zVision Dashboard.   Electronically Signed   By: Awilda Metro   On: 01/27/2014 12:43   Dg Chest Portable 1 View In Am  01/27/2014   CLINICAL DATA:  Status post CABG.  EXAM: PORTABLE CHEST - 1 VIEW  COMPARISON:  Single view of the chest 01/26/2014.  FINDINGS: Support tubes and lines are unchanged. There is no pneumothorax with a mediastinal drain and left chest tube in place. Heart size is upper normal. There now small bilateral pleural effusions and mild basilar atelectasis.  IMPRESSION: New small bilateral pleural effusions and subsegmental atelectasis in the bases.  Mild interstitial edema.   Electronically Signed   By: Drusilla Kanner M.D.   On: 01/27/2014 08:13   Dg Chest Portable 1 View  01/26/2014   CLINICAL DATA:  Assess support tube positioning  EXAM: PORTABLE CHEST - 1 VIEW  COMPARISON:  Portable chest x-ray of January 26, 2014  FINDINGS: The esophagogastric tube tip is approximately 4.4 cm above the crotch of the carina.  The left-sided chest tube tip overlies the posterior aspect of the left fourth rib and is unchanged.  A left lower medial mediastinal drain is present and unchanged.  The intraaortic balloon pump marker has been advanced to the proximal descending thoracic aorta.  The Swan-Ganz catheter tip lies in the distal right main pulmonary artery.  The esophagogastric tube tip projects off the inferior margin of the film.  The cardiac silhouette is normal in size. A prosthetic aortic valve ring is visible. The patient  has undergone CABG. The upper lobe  pulmonary vascularity is engorged and the interstitial markings are increased though stable. There is a small left pleural effusion. No pneumothorax.  IMPRESSION: 1. Advancement of the intra-aortic balloon pump has been performed and a radiodense marker lies at the junction of the distal aortic arch with the proximal descending thoracic aorta. 2. The other support tubes and lines are in appropriate position.   Electronically Signed   By: David  [place]   On: 01/26/2014 10:56   Dg Chest Portable 1 View  01/26/2014   CLINICAL DATA:  Post valve replacement.  EXAM: PORTABLE CHEST - 1 VIEW  COMPARISON:  01/25/2014.  FINDINGS: Endotracheal tube tip 5.8 cm above the carina.  Right-sided Swan-Ganz catheter tip right distal main pulmonary artery level.  Post mediastinum Re/CABG/valve replacement with top-normal size heart.  Aortic balloon pump tip projecting at the proximal to mid descending thoracic aorta are. Aorta is calcified.  Left-sided chest tube and mediastinal drain are in place. No gross pneumothorax.  Pulmonary edema greatest in the upper lung zones has progressed minimally since the preoperative exam.  Left base subsegmental atelectasis.  Nasogastric tube courses below the diaphragm. Tip is not included on the present exam.  IMPRESSION: Endotracheal tube tip 5.8 cm above the carina.  Right-sided Swan-Ganz catheter tip right distal main pulmonary artery level.  Post mediastinum Re/CABG/valve replacement with top-normal size heart.  Aortic balloon pump tip projecting at the proximal to mid descending thoracic aorta are. Aorta is calcified.  Left-sided chest tube and mediastinal drain are in place. No gross pneumothorax.  Pulmonary edema greatest in the upper lung zones has progressed minimally since the preoperative exam.  Left base subsegmental atelectasis.   Electronically Signed   By: Bridgett Larsson M.D.   On: 01/26/2014 09:38   Dg Chest Port 1 View  01/25/2014   CLINICAL DATA:  Preoperative evaluation,  balloon pump.  EXAM: PORTABLE CHEST - 1 VIEW  COMPARISON:  None.  FINDINGS: Cardiac silhouette appears at least mildly enlarged, mediastinal silhouette is nonsuspicious, calcified aortic knob. Central pulmonary vascular congestion/ interstitial prominence, confluent in the lung apices. No pleural effusions. No pneumothorax.  Multiple EKG lines overlie the patient and may obscure subtle underlying pathology. Aortic balloon pump in place, radiopaque marker projecting below the aortic arch.  IMPRESSION: At least mild cardiomegaly, interstitial prominence suggests pulmonary edema, confluent in the lung apices. No pleural effusions.  Aortic balloon pump in place.   Electronically Signed   By: Awilda Metro   On: 01/25/2014 22:25   Dg Abd Portable 1v  02/01/2014   CLINICAL DATA:  Feeding tube placement. Acute myocardial infarct. Postop from CABG and mitral valve replacement.  EXAM: PORTABLE ABDOMEN - 1 VIEW  COMPARISON:  01/30/2014  FINDINGS: A feeding tube is seen in place with the tip in the distal duodenum near the ligament of Treitz. A nasogastric tube is seen with tip overlying the proximal stomach. The bowel gas pattern is normal.  IMPRESSION: Feeding tube tip in distal duodenum near the ligament of Treitz. Nasogastric tube tip in proximal stomach.   Electronically Signed   By: Myles Rosenthal M.D.   On: 02/01/2014 08:19   Dg Abd Portable 1v  01/30/2014   CLINICAL DATA:  Feeding tube placement.  EXAM: PORTABLE ABDOMEN - 1 VIEW  COMPARISON:  None.  FINDINGS: Feeding tube is in place in looped in the distal stomach with the tip projecting retrograde.  IMPRESSION: As above.   Electronically Signed  By: Drusilla Kanner M.D.   On: 01/30/2014 11:17      ASSESSMENT: Roosvelt Harps:  PAF: COntinue IV AMio.  No oral intake at the moment. COnsdier switch to PO when she is has functioning OG tube.  Acute renal failure.  Corky Crafts., MD  02/09/2014  11:54 AM

## 2014-02-09 NOTE — Progress Notes (Addendum)
PULMONARY / CRITICAL CARE MEDICINE  Name: [name] MRN: [id number] DOB: June 08, 1941    ADMISSION DATE:  01/25/2014 CONSULTATION DATE:  02/09/2014  REFERRING MD :  Tyrone SageGerhardt  CHIEF COMPLAINT:  Hypoxemia  INITIAL PRESENTATION: 73 yo admitted 7/12 with STEMI. Taken to cath lab but unable to perform PCI due to significant RCA disease. Taken to OR emergently for CABG x 4.  Returned to SICU mechanically ventilated.   STUDIES / EVENTS:  7/12  Left heart cath >>> occlusion of RCA not amendable to PCI 7/13  OR >>> CABG x 4 + MVR 7/20  TTE >>>  EF 55%, no RWMA, grade 3 DD, PAP 41 torr 7/24  R thoracentesis 1,200 mL 7/25  Extubated 7/27  Left hemithorax opassification  INTERVAL HISTORY:  L hemithorax opacification.  Placed on BiPAP.  VITAL SIGNS: Temp:  [97.3 F (36.3 C)-98.8 F (37.1 C)] 98 F (36.7 C) (07/27 0801) Pulse Rate:  [59-130] 88 (07/27 0700) Resp:  [26-38] 38 (07/27 0700) BP: (85-128)/(41-91) 96/60 mmHg (07/27 0748) SpO2:  [93 %-100 %] 100 % (07/27 0700) FiO2 (%):  [40 %] 40 % (07/27 40980633) Weight:  [105.1 kg (231 lb 11.3 oz)] 105.1 kg (231 lb 11.3 oz) (07/27 0600)  HEMODYNAMICS: CVP:  [14 mmHg-15 mmHg] 15 mmHg  VENTILATOR SETTINGS: Vent Mode:  [-]  FiO2 (%):  [40 %] 40 %  INTAKE / OUTPUT: Intake/Output     07/26 0701 - 07/27 0700 07/27 0701 - 07/28 0700   I.V. (mL/kg) 854.5 (8.1)    Blood     NG/GT     IV Piggyback 100    Total Intake(mL/kg) 954.5 (9.1)    Urine (mL/kg/hr) 765 (0.3)    Total Output 765     Net +189.5          Stool Occurrence 1 x     PHYSICAL EXAMINATION: General: No distress Neuro: Awake, alert HEENT: BiPAP mask on Cardiovascular: Regular, no murmurs Lungs: Diminished air entry on the left Abdomen: Soft, non tender, bowel sounds present Ext: Bilateral symmetric  pitting edema  LABS: CBC  Recent Labs Lab 02/07/14 0400 02/08/14 0328 02/08/14 0330 02/09/14 0330  WBC 16.4*  --  16.0* 17.1*  HGB 7.8* 11.9* 10.3* 10.3*  HCT  24.2* 35.0* 31.8* 32.0*  PLT 397  --  374 429*   Coag's  Recent Labs Lab 02/08/14 0730 02/08/14 1600 02/09/14 0330  APTT 115* 88* 113*   BMET  Recent Labs Lab 02/07/14 0400 02/08/14 0328 02/08/14 0330 02/09/14 0330  NA 135* 131* 136* 134*  K 3.7 3.9 4.1 4.8  CL 86* 86* 88* 86*  CO2 36*  --  34* 29  BUN 78* 89* 88* 103*  CREATININE 1.69* 2.30* 1.92* 2.47*  GLUCOSE 142* 107* 92 165*   Electrolytes  Recent Labs Lab 02/05/14 0430 02/06/14 0435 02/07/14 0400 02/08/14 0330 02/09/14 0330  CALCIUM 8.0* 8.5 8.7 8.7 8.5  MG 1.6 1.7 2.1  --   --   PHOS 3.1 3.6 4.2  --   --    Sepsis Markers No results found for this basename: LATICACIDVEN, PROCALCITON, O2SATVEN,  in the last 168 hours  ABG  Recent Labs Lab 02/07/14 0437 02/08/14 0500 02/09/14 0453  PHART 7.516* 7.499* 7.503*  PCO2ART 42.7 45.3* 38.5  PO2ART 87.5 84.0 114.0*   Liver Enzymes  Recent Labs Lab 02/06/14 0435 02/07/14 0400 02/08/14 0330  AST 34 35 40*  ALT 28 24 27   ALKPHOS 153* 146* 165*  BILITOT  1.7* 1.5* 1.8*  ALBUMIN 3.2* 2.9* 2.9*   Cardiac Enzymes No results found for this basename: TROPONINI, PROBNP,  in the last 168 hours  Glucose  Recent Labs Lab 02/08/14 0853 02/08/14 1212 02/08/14 1540 02/08/14 1938 02/08/14 2321 02/09/14 0727  GLUCAP 100* 98 107* 86 115* 98   IMAGING:  ASSESSMENT / PLAN:  Acute hypoxemic respiratory failure.   Intubate electively for therapeutic bronchoscopy  Goal pH>7.30, SpO2>92  Continuous mechanical support x 24 hours then reevaluate  VAP bundle  SBT in AM  Trend ABG/CXR    Left hemithorax opacification with bedside sonography demonstrating minimal fluid.  That and the fact that the change occurred overnight makes mucous plugging more likely than significant effusion.    Do not see immediate benefit form chest CT  Therapeutic bronchoscopy  Xopenex / Mucomyst q4h  Defer chest physical therapy per TCTS request  Bilateral pleural  effusions. S/p R thoracentesis for 1,200 mL on 7/24.   Consider further diuresis per TCTS  I have personally obtained history, examined patient, evaluated and interpreted laboratory and imaging results, reviewed medical records, formulated assessment / plan and placed orders.  CRITICAL CARE:  The patient is critically ill with multiple organ systems failure and requires high complexity decision making for assessment and support, frequent evaluation and titration of therapies, application of advanced monitoring technologies and extensive interpretation of multiple databases. Critical Care Time devoted to patient care services described in this note is 35 minutes.   Lonia Farber, MD Pulmonary and Critical Care Medicine [facility] Pager: [phone]  02/09/2014, 8:54 AM

## 2014-02-09 NOTE — Procedures (Signed)
Intubation Procedure Note [name] [id number] 04-27-1941  Procedure: Intubation Indications: Respiratory insufficiency  Procedure Details Consent: Risks of procedure as well as the alternatives and risks of each were explained to the (patient/caregiver).  Consent for procedure obtained. Time Out: Verified patient identification, verified procedure, site/side was marked, verified correct patient position, special equipment/implants available, medications/allergies/relevent history reviewed, required imaging and test results available.  Performed  Maximum sterile technique was used including cap, gloves, hand hygiene and mask.  MAC and 3    Evaluation Hemodynamic Status: Transient hypotension treated with fluid; O2 sats: stable throughout Patient's Current Condition: stable Complications: No apparent complications Patient did tolerate procedure well. Chest X-ray ordered to verify placement.  CXR: pending.   [name],PETE 02/09/2014

## 2014-02-09 NOTE — Progress Notes (Signed)
eLink Physician-Brief Progress Note Patient Name: [name] DOB: 06-10-1941 MRN: [id number]  Date of Service  02/09/2014   HPI/Events of Note   Left collapse, mild distress  eICU Interventions  abg reviewed,  Add chest pt, mucopmysts, nimv   Intervention Category Intermediate Interventions: Other:  Nelda Bucks. 02/09/2014, 6:02 AM

## 2014-02-10 ENCOUNTER — Inpatient Hospital Stay (HOSPITAL_COMMUNITY): Payer: Medicare Other

## 2014-02-10 LAB — COMPREHENSIVE METABOLIC PANEL
ALT: 28 U/L (ref 0–35)
AST: 43 U/L — ABNORMAL HIGH (ref 0–37)
Albumin: 2.7 g/dL — ABNORMAL LOW (ref 3.5–5.2)
Alkaline Phosphatase: 152 U/L — ABNORMAL HIGH (ref 39–117)
Anion gap: 21 — ABNORMAL HIGH (ref 5–15)
BUN: 111 mg/dL — ABNORMAL HIGH (ref 6–23)
CO2: 26 mEq/L (ref 19–32)
Calcium: 8.4 mg/dL (ref 8.4–10.5)
Chloride: 86 mEq/L — ABNORMAL LOW (ref 96–112)
Creatinine, Ser: 3.05 mg/dL — ABNORMAL HIGH (ref 0.50–1.10)
GFR calc Af Amer: 16 mL/min — ABNORMAL LOW (ref >90)
GFR calc non Af Amer: 14 mL/min — ABNORMAL LOW (ref >90)
Glucose, Bld: 198 mg/dL — ABNORMAL HIGH (ref 70–99)
Potassium: 3.6 mEq/L — ABNORMAL LOW (ref 3.7–5.3)
Sodium: 133 mEq/L — ABNORMAL LOW (ref 137–147)
Total Bilirubin: 1.4 mg/dL — ABNORMAL HIGH (ref 0.3–1.2)
Total Protein: 6.6 g/dL (ref 6.0–8.3)

## 2014-02-10 LAB — CBC
HCT: 29 % — ABNORMAL LOW (ref 36.0–46.0)
HCT: 28 % — ABNORMAL LOW (ref 36.0–46.0)
Hemoglobin: 9.4 g/dL — ABNORMAL LOW (ref 12.0–15.0)
Hemoglobin: 9.1 g/dL — ABNORMAL LOW (ref 12.0–15.0)
MCH: 30 pg (ref 26.0–34.0)
MCH: 29.8 pg (ref 26.0–34.0)
MCHC: 32.4 g/dL (ref 30.0–36.0)
MCHC: 32.5 g/dL (ref 30.0–36.0)
MCV: 92.7 fL (ref 78.0–100.0)
MCV: 91.8 fL (ref 78.0–100.0)
Platelets: 373 10*3/uL (ref 150–400)
Platelets: 410 10*3/uL — ABNORMAL HIGH (ref 150–400)
RBC: 3.13 MIL/uL — ABNORMAL LOW (ref 3.87–5.11)
RBC: 3.05 MIL/uL — ABNORMAL LOW (ref 3.87–5.11)
RDW: 18 % — ABNORMAL HIGH (ref 11.5–15.5)
RDW: 18.2 % — ABNORMAL HIGH (ref 11.5–15.5)
WBC: 16.7 10*3/uL — ABNORMAL HIGH (ref 4.0–10.5)
WBC: 23.7 10*3/uL — ABNORMAL HIGH (ref 4.0–10.5)

## 2014-02-10 LAB — POCT I-STAT, CHEM 8
BUN: 109 mg/dL — ABNORMAL HIGH (ref 6–23)
Calcium, Ion: 0.98 mmol/L — ABNORMAL LOW (ref 1.13–1.30)
Chloride: 88 mEq/L — ABNORMAL LOW (ref 96–112)
Creatinine, Ser: 3.4 mg/dL — ABNORMAL HIGH (ref 0.50–1.10)
Glucose, Bld: 164 mg/dL — ABNORMAL HIGH (ref 70–99)
HCT: 34 % — ABNORMAL LOW (ref 36.0–46.0)
Hemoglobin: 11.6 g/dL — ABNORMAL LOW (ref 12.0–15.0)
Potassium: 3.9 mEq/L (ref 3.7–5.3)
Sodium: 131 mEq/L — ABNORMAL LOW (ref 137–147)
TCO2: 26 mmol/L (ref 0–100)

## 2014-02-10 LAB — GLUCOSE, CAPILLARY
Glucose-Capillary: 179 mg/dL — ABNORMAL HIGH (ref 70–99)
Glucose-Capillary: 184 mg/dL — ABNORMAL HIGH (ref 70–99)
Glucose-Capillary: 189 mg/dL — ABNORMAL HIGH (ref 70–99)
Glucose-Capillary: 194 mg/dL — ABNORMAL HIGH (ref 70–99)
Glucose-Capillary: 208 mg/dL — ABNORMAL HIGH (ref 70–99)
Glucose-Capillary: 231 mg/dL — ABNORMAL HIGH (ref 70–99)
Glucose-Capillary: 249 mg/dL — ABNORMAL HIGH (ref 70–99)
Glucose-Capillary: 251 mg/dL — ABNORMAL HIGH (ref 70–99)

## 2014-02-10 LAB — CARBOXYHEMOGLOBIN
Carboxyhemoglobin: 1.4 % (ref 0.5–1.5)
Methemoglobin: 1 % (ref 0.0–1.5)
O2 Saturation: 61.2 %
Total hemoglobin: 9.7 g/dL — ABNORMAL LOW (ref 12.0–16.0)

## 2014-02-10 LAB — BODY FLUID CULTURE
Culture: NO GROWTH
GRAM STAIN: NONE SEEN

## 2014-02-10 MED ORDER — ASPIRIN 81 MG PO CHEW
324.0000 mg | CHEWABLE_TABLET | Freq: Every day | ORAL | Status: DC
  Administered 2014-02-10 – 2014-03-06 (×24): 324 mg
  Filled 2014-02-10 (×26): qty 4

## 2014-02-10 MED ORDER — ALBUMIN HUMAN 25 % IV SOLN
12.5000 g | Freq: Once | INTRAVENOUS | Status: AC
  Administered 2014-02-10: 12.5 g via INTRAVENOUS
  Filled 2014-02-10: qty 50

## 2014-02-10 MED ORDER — NEPRO/CARBSTEADY PO LIQD
1000.0000 mL | ORAL | Status: DC
  Administered 2014-02-10: 1000 mL via ORAL
  Filled 2014-02-10 (×3): qty 1000

## 2014-02-10 MED ORDER — PIPERACILLIN-TAZOBACTAM 3.375 G IVPB: 3.3750 g | Freq: Two times a day (BID) | INTRAVENOUS | Status: DC

## 2014-02-10 MED ORDER — PRO-STAT SUGAR FREE PO LIQD
30.0000 mL | Freq: Every day | ORAL | Status: DC
  Administered 2014-02-10 – 2014-02-16 (×28): 30 mL
  Administered 2014-02-16: 09:00:00
  Administered 2014-02-16 – 2014-02-17 (×5): 30 mL
  Administered 2014-02-17: 09:00:00
  Filled 2014-02-10 (×38): qty 30

## 2014-02-10 MED ORDER — INSULIN GLARGINE 100 UNIT/ML ~~LOC~~ SOLN
25.0000 [IU] | Freq: Two times a day (BID) | SUBCUTANEOUS | Status: DC
  Administered 2014-02-10 – 2014-02-25 (×30): 25 [IU] via SUBCUTANEOUS
  Filled 2014-02-10 (×31): qty 0.25

## 2014-02-10 MED ORDER — PIPERACILLIN-TAZOBACTAM IN DEX 2-0.25 GM/50ML IV SOLN
2.2500 g | Freq: Four times a day (QID) | INTRAVENOUS | Status: DC
  Administered 2014-02-10 – 2014-02-17 (×29): 2.25 g via INTRAVENOUS
  Filled 2014-02-10 (×31): qty 50

## 2014-02-10 MED ORDER — HEPARIN SODIUM (PORCINE) 1000 UNIT/ML DIALYSIS
1000.0000 [IU] | INTRAMUSCULAR | Status: DC | PRN
Start: 2014-02-10 — End: 2014-02-27
  Administered 2014-02-10 – 2014-02-26 (×2): 2800 [IU] via INTRAVENOUS_CENTRAL
  Filled 2014-02-10 (×3): qty 6
  Filled 2014-02-10: qty 3

## 2014-02-10 MED ORDER — NOREPINEPHRINE BITARTRATE 1 MG/ML IV SOLN
2.0000 ug/min | INTRAVENOUS | Status: DC
  Administered 2014-02-10: 12 ug/min via INTRAVENOUS
  Administered 2014-02-11: 14 ug/min via INTRAVENOUS
  Administered 2014-02-13: 5 ug/min via INTRAVENOUS
  Administered 2014-02-14: 7.04 ug/min via INTRAVENOUS
  Administered 2014-02-16 – 2014-02-17 (×2): 7 ug/min via INTRAVENOUS
  Administered 2014-02-20: 18 ug/min via INTRAVENOUS
  Administered 2014-02-21 – 2014-02-23 (×4): 20 ug/min via INTRAVENOUS
  Administered 2014-02-24: 12 ug/min via INTRAVENOUS
  Administered 2014-02-24: 11 ug/min via INTRAVENOUS
  Administered 2014-02-24: 13 ug/min via INTRAVENOUS
  Administered 2014-02-24: 10 ug/min via INTRAVENOUS
  Administered 2014-02-24 – 2014-02-25 (×2): 9 ug/min via INTRAVENOUS
  Administered 2014-02-26: 10 ug/min via INTRAVENOUS
  Administered 2014-02-27: 12 ug/min via INTRAVENOUS
  Administered 2014-02-28: 16 ug/min via INTRAVENOUS
  Administered 2014-02-28: 15 ug/min via INTRAVENOUS
  Administered 2014-03-01: 12 ug/min via INTRAVENOUS
  Filled 2014-02-10 (×21): qty 16

## 2014-02-10 MED ORDER — FLUCONAZOLE 100MG IVPB
100.0000 mg | INTRAVENOUS | Status: DC
  Administered 2014-02-10: 100 mg via INTRAVENOUS
  Filled 2014-02-10 (×2): qty 50

## 2014-02-10 MED ORDER — INSULIN GLARGINE 100 UNIT/ML ~~LOC~~ SOLN
20.0000 [IU] | Freq: Two times a day (BID) | SUBCUTANEOUS | Status: DC
  Administered 2014-02-10: 20 [IU] via SUBCUTANEOUS
  Filled 2014-02-10 (×2): qty 0.2

## 2014-02-10 NOTE — Procedures (Signed)
Hemodialysis Catheter Insertion Procedure Note [name] [id number] 06-22-1941  Procedure: Insertion of Hemodialysis Catheter Indications: Hemodialysis  Procedure Details Consent: Risks of procedure as well as the alternatives and risks of each were explained to the (patient/caregiver).  Consent for procedure obtained. Time Out: Verified patient identification, verified procedure, site/side was marked, verified correct patient position, special equipment/implants available, medications/allergies/relevent history reviewed, required imaging and test results available.  Performed  Maximum sterile technique was used including antiseptics, cap, gloves, gown, hand hygiene, mask and sheet. Skin prep: Chlorhexidine; local anesthetic administered A antimicrobial bonded/coated triple lumen catheter was placed in the left internal jugular vein using the Seldinger technique. Ultrasound guidance used.Yes.   Catheter placed to 20 cm. Blood aspirated via all 3 ports and then flushed x 3. Line sutured x 2 and dressing applied.  Evaluation Blood flow good Complications: No apparent complications Patient did tolerate procedure well. Chest X-ray ordered to verify placement.  CXR: pending.  Procedure performed by: Anders Simmonds ACNP  Joneen Roach, ACNP [facility] Pager [phone] or [phone]   I was present for and supervised the entire procedure  Billy Fischer, MD ; [facility] service Mobile [phone].  After 5:30 PM or weekends, call [phone]

## 2014-02-10 NOTE — Progress Notes (Signed)
CT surgery PM Rounds  Remains oliguric- appreciate Renal consult and plans to start CVVH in am Will not wean vent until volume can be removed Check Hb, lytes this pm

## 2014-02-10 NOTE — Progress Notes (Signed)
ANTIBIOTIC CONSULT NOTE - INITIAL  Pharmacy Consult for zosyn Indication: pneumonia  No Known Allergies  Patient Measurements: Height: 5\' 7"  (170.2 cm) Weight: 224 lb 13.9 oz (102 kg) IBW/kg (Calculated) : 61.6  Vital Signs: Temp: 99.3 F (37.4 C) (07/28 0800) Temp src: Axillary (07/28 0724) BP: 104/51 mmHg (07/28 0800) Pulse Rate: 87 (07/28 0800) Intake/Output from previous day: 07/27 0701 - 07/28 0700 In: 1736.5 [I.V.:713.5; NG/GT:173; IV Piggyback:850] Out: 617 [Urine:427; Chest Tube:190] Intake/Output from this shift: Total I/O In: 167.9 [I.V.:17.9; NG/GT:50; IV Piggyback:100] Out: 135 [Urine:25; Chest Tube:110]  Labs:  Recent Labs  02/08/14 0330 02/09/14 0330 02/10/14 0455  WBC 16.0* 17.1* 16.7*  HGB 10.3* 10.3* 9.4*  PLT 374 429* 373  CREATININE 1.92* 2.47* 3.05*   Estimated Creatinine Clearance: 19.9 ml/min (by C-G formula based on Cr of 3.05). No results found for this basename: VANCOTROUGH, Leodis Binet, VANCORANDOM, GENTTROUGH, GENTPEAK, GENTRANDOM, TOBRATROUGH, TOBRAPEAK, TOBRARND, AMIKACINPEAK, AMIKACINTROU, AMIKACIN,  in the last 72 hours   Microbiology: Recent Results (from the past 720 hour(s))  MRSA PCR SCREENING     Status: None   Collection Time    01/26/14 11:10 AM      Result Value Ref Range Status   MRSA by PCR NEGATIVE  NEGATIVE Final   Comment:            The GeneXpert MRSA Assay (FDA     approved for NASAL specimens     only), is one component of a     comprehensive MRSA colonization     surveillance program. It is not     intended to diagnose MRSA     infection nor to guide or     monitor treatment for     MRSA infections.  CULTURE, RESPIRATORY (NON-EXPECTORATED)     Status: None   Collection Time    01/27/14  9:53 AM      Result Value Ref Range Status   Specimen Description TRACHEAL ASPIRATE   Final   Special Requests Normal   Final   Gram Stain     Final   Value: FEW WBC PRESENT,BOTH PMN AND MONONUCLEAR     NO SQUAMOUS  EPITHELIAL CELLS SEEN     NO ORGANISMS SEEN     Performed at [facility]   Culture     Final   Value: NO GROWTH 2 DAYS     Performed at [facility]   Report Status 01/29/2014 FINAL   Final  URINE CULTURE     Status: None   Collection Time    02/02/14 12:19 PM      Result Value Ref Range Status   Specimen Description URINE, CATHETERIZED   Final   Special Requests Normal   Final   Culture  Setup Time     Final   Value: 02/02/2014 19:42     Performed at [facility] Count     Final   Value: NO GROWTH     Performed at [facility]   Culture     Final   Value: NO GROWTH     Performed at [facility]   Report Status 02/03/2014 FINAL   Final  CLOSTRIDIUM DIFFICILE BY PCR     Status: None   Collection Time    02/05/14  5:18 PM      Result Value Ref Range Status   C difficile by pcr NEGATIVE  NEGATIVE Final  AFB CULTURE WITH SMEAR  Status: None   Collection Time    02/06/14  2:45 PM      Result Value Ref Range Status   Specimen Description FLUID PLEURAL   Final   Special Requests NONE   Final   Acid Fast Smear     Final   Value: NO ACID FAST BACILLI SEEN     [facility]formed at [facility]   Culture     Final   Value: CULTURE WILL BE EXAMINED FOR 6 WEEKS BEFORE ISSUING A FINAL REPORT     Performed at [facility]   Report Status PENDING   Incomplete  BODY FLUID CULTURE     Status: None   Collection Time    02/06/14  2:45 PM      Result Value Ref Range Status   Specimen Description PLEURAL FLUID   Final   Special Requests NONE   Final   Gram Stain     Final   Value: NO WBC SEEN     NO ORGANISMS SEEN     [facility]formed at [facility]   Culture     Final   Value: NO GROWTH 3 DAYS     [facility]formed at [facility]   Report Status PENDING   Incomplete  FUNGUS CULTURE W SMEAR     Status: None   Collection Time    02/06/14  2:46 PM      Result Value Ref Range Status   Specimen Description FLUID PLEURAL    Final   Special Requests NONE   Final   Fungal Smear     Final   Value: NO YEAST OR FUNGAL ELEMENTS SEEN     Performed at [facility]   Culture     Final   Value: CULTURE IN PROGRESS FOR FOUR WEEKS     Performed at [facility]   Report Status PENDING   Incomplete  CULTURE, RESPIRATORY (NON-EXPECTORATED)     Status: None   Collection Time    02/09/14  8:27 AM      Result Value Ref Range Status   Specimen Description TRACHEAL ASPIRATE   Final   Special Requests NONE   Final   Gram Stain     Final   Value: FEW WBC PRESENT,BOTH PMN AND MONONUCLEAR     RARE SQUAMOUS EPITHELIAL CELLS PRESENT     MODERATE GRAM NEGATIVE RODS     FEW GRAM POSITIVE RODS     RARE GRAM POSITIVE COCCI IN PAIRS   Culture     Final   Value: Non-Pathogenic Oropharyngeal-type Flora Isolated.     Performed at [facility]   Report Status PENDING   Incomplete  CULTURE, ROUTINE-ABSCESS     Status: None   Collection Time    02/09/14  4:08 PM      Result Value Ref Range Status   Specimen Description ABSCESS JP DRAINAGE   Final   Special Requests NONE   Final   Gram Stain     Final   Value: FEW WBC PRESENT,BOTH PMN AND MONONUCLEAR     NO SQUAMOUS EPITHELIAL CELLS SEEN     NO ORGANISMS SEEN     Performed at [facility]   Culture     Final   Value: NO GROWTH     [facility]formed at [facility]   Report Status PENDING   Incomplete    Assessment: [age] yof admitted for emergent CABG x 4 /MVR. Being initiated on zosyn  for PNA. Afebrile, WBC 16.7. SCr trending up to 3.05 (CrCl~20).  Goal of Therapy:  Eradication of infection.  Plan:  -Zosyn 2.25g IV q6h -F/u renal function, cx  Babs Bertin, PharmD Clinical Pharmacist - Resident Pager [phone] 7/28/201510:15 AM

## 2014-02-10 NOTE — Progress Notes (Signed)
NUTRITION CONSULT/FOLLOW UP  INTERVENTION: Continue Nepro at 30 ml/hr Add Prostat liquid protein 30 ml 5 times daily via tube  Total TF regimen to provide 1796 total kcals, 133 gm protein, 501 ml of free water RD to follow for nutrition care plan  NUTRITION DIAGNOSIS: Inadequate oral intake related to inability to eat as evidenced by NPO status, ongoing  New Goal: Pt to meet >/= 90% of their estimated nutrition needs, progressing  Monitor:  TF regimen & tolerance, respiratory status, weight, labs, I/O's  ASSESSMENT: PMHx significant for CAD, TIA, HTN, DM2, PAD. Admitted with n/v, diaphoresis and chest pain. Work-up reveals STEMI.  Underwent L heart catheterization with coronary angiogram on 7/12. Work-up reveals severe multivessel CAD. Pt then went to OR for emergent CABG x 4 on 7/13.  Patient extubated 7/25.  Re-intubated 7/27.  OGT out.  Patient is currently on ventilator support MV: 10.3 L/min Temp (24hrs), Avg:98.7 F (37.1 C), Min:97.4 F (36.3 C), Max:100.5 F (38.1 C)   CT guided drainage catheter placed into left pleural space per IR 7/27.  S/p HD catheter insertion this afternoon.  To start on CVVHD tomorrow.    RD consulted for assessment of nutrition requirements/status.  Nepro formula currently infusing at 30 ml/hr via Panda tube providing 1296 kcals, 58 gm protein, 501 ml of free water.  Height: Ht Readings from Last 1 Encounters:  01/31/14 5\' 7"  (1.702 m)    Weight: Wt Readings from Last 1 Encounters:  02/10/14 224 lb 13.9 oz (102 kg)  01/25/14           180 lb (81.6 kg)  BMI:  Body mass index is 35.21 kg/(m^2).   Re-estimated Nutritional Needs: Kcal: 1750-1900 Protein: 125-135 gm Fluid: per MD  Skin: leg and chest incisions  Diet Order: NPO   Intake/Output Summary (Last 24 hours) at 02/10/14 1514 Last data filed at 02/10/14 1000  Gross per 24 hour  Intake 1579.54 ml  Output    615 ml  Net 964.54 ml    Labs:   Recent Labs Lab  02/05/14 0430 02/06/14 0435 02/07/14 0400  02/08/14 0330 02/09/14 0330 02/09/14 1743 02/10/14 0455  NA 132* 133* 135*  < > 136* 134* 131* 133*  K 3.3* 3.3* 3.7  < > 4.1 4.8 3.9 3.6*  CL 87* 84* 86*  < > 88* 86* 88* 86*  CO2 34* 35* 36*  --  34* 29  --  26  BUN 56* 67* 78*  < > 88* 103* 109* 111*  CREATININE 1.26* 1.42* 1.69*  < > 1.92* 2.47* 3.40* 3.05*  CALCIUM 8.0* 8.5 8.7  --  8.7 8.5  --  8.4  MG 1.6 1.7 2.1  --   --   --   --   --   PHOS 3.1 3.6 4.2  --   --   --   --   --   GLUCOSE 167* 154* 142*  < > 92 165* 164* 198*  < > = values in this interval not displayed.  CBG (last 3)   Recent Labs  02/10/14 0720 02/10/14 1225 02/10/14 1411  GLUCAP 179* 184* 231*    Scheduled Meds: . acetylcysteine  4 mL Nebulization 6 times per day  . antiseptic oral rinse  15 mL Mouth Rinse Q2H  . aspirin  324 mg Per Tube Daily  . bisacodyl  10 mg Oral Daily   Or  . bisacodyl  10 mg Rectal Daily  . chlorhexidine  15 mL Mouth  Rinse BID  . enoxaparin (LOVENOX) injection  30 mg Subcutaneous Q24H  . fluconazole (DIFLUCAN) IV  100 mg Intravenous Q24H  . insulin aspart  0-24 Units Subcutaneous 6 times per day  . insulin glargine  20 Units Subcutaneous BID  . levalbuterol  1.25 mg Nebulization 6 times per day  . levothyroxine  37.5 mcg Intravenous QAC breakfast  . pantoprazole (PROTONIX) IV  40 mg Intravenous Q24H  . piperacillin-tazobactam (ZOSYN)  IV  2.25 g Intravenous Q6H  . sodium chloride  10-40 mL Intracatheter Q12H  . vancomycin  1,500 mg Intravenous Q48H    Continuous Infusions: . sodium chloride Stopped (02/01/14 0830)  . sodium chloride 250 mL (02/10/14 0800)  . amiodarone 20 mg/hr (02/10/14 1454)  . feeding supplement (NEPRO CARB STEADY) 1,000 mL (02/10/14 1000)  . milrinone 0.25 mcg/kg/min (02/10/14 0800)  . norepinephrine (LEVOPHED) Adult infusion 6 mcg/min (02/10/14 1330)    Past Medical History  Diagnosis Date  . History of MI (myocardial infarction)     [facility] (cannot get cath report on Care Everywhere(  . Essential hypertension   . TIA (transient ischemic attack)   . Thyroid disease   . Anxiety   . Diabetes mellitus type 2 with peripheral artery disease     On insulin  . PAD (peripheral artery disease)   . Hyperlipidemia associated with type 2 diabetes mellitus   . A-fib   . ARF (acute renal failure)     Past Surgical History  Procedure Laterality Date  . Cardiac catheterization      PCI - unknown vessel or stent; unknown [facility]; [facility].  . Abdominal hysterectomy    . Cholecystectomy    . Coronary artery bypass graft N/A 01/25/2014    Procedure: CORONARY ARTERY BYPASS GRAFTING TIMES FOUR USING LEFT INTERNAL MAMMARY ARTERY TO LAD, SAPHENOUS VEIN GRAFTS TO OM1, OM2, AND PDA;  Surgeon: Kerin PernaPeter Van Trigt, MD;  [facility];  Service: Open Heart Surgery;  Laterality: N/A;  . Mitral valve replacement N/A 01/25/2014    Procedure: MITRAL VALVE (MV) REPLACEMENT;  Surgeon: Kerin PernaPeter Van Trigt, MD;  [facility];  Service: Open Heart Surgery;  Laterality: N/A;    Maureen ChattersKatie Overton Boggus, RD, LDN Pager #: [phone] After-Hours Pager #: [phone]

## 2014-02-10 NOTE — Consult Note (Signed)
Referring Provider: No ref. provider found Primary Care Physician:  Mickle Plumb, NP Primary Nephrologist:    Reason for Consultation:  Acute Kidney Injury with worsening creatinine   Antibiotics    Vancomycin    7/27    >>                        Zosyn              7/27    >>                        Diflucan          7/27    >>                        Fortaz             7/15 - 7/27   Creatinine     1.1        7/21                         1.2       7/22                         1.26     7/23                         1.42      7/24                         1.69      7/25                         1.92      7/26                         3.05      7/28  Fluid balance positive 5 l with diminished urine output  Blood pressure low below consistently  No nephrotoxins      HPI: 73 year old admitted 7/12 with code STEMI underwent cath and CABG x 4 V and MVR 7/13  requiring ballon pump. Complicated by pulmonary HTN and s/p R thoracostomy for pleural effusion. She also has cardiogenic shock that requires pressor treatment  Past Medical History  Diagnosis Date  . History of MI (myocardial infarction)     PCI done @ [facility] (cannot get cath report on Care Everywhere(  . Essential hypertension   . TIA (transient ischemic attack)   . Thyroid disease   . Anxiety   . Diabetes mellitus type 2 with peripheral artery disease     On insulin  . PAD (peripheral artery disease)   . Hyperlipidemia associated with type 2 diabetes mellitus   . A-fib   . ARF (acute renal failure)     Past Surgical History  Procedure Laterality Date  . Cardiac catheterization      PCI - unknown vessel or stent; unknown date; @ [facility].  . Abdominal hysterectomy    . Cholecystectomy    . Coronary artery bypass graft N/A 01/25/2014    Procedure: CORONARY ARTERY BYPASS GRAFTING TIMES FOUR USING LEFT INTERNAL MAMMARY ARTERY TO LAD, SAPHENOUS VEIN GRAFTS TO OM1, OM2, AND  PDA;  Surgeon: Kerin Perna, MD;   Location: [facility] OR;  Service: Open Heart Surgery;  Laterality: N/A;  . Mitral valve replacement N/A 01/25/2014    Procedure: MITRAL VALVE (MV) REPLACEMENT;  Surgeon: Kerin Perna, MD;  Location: [facility] OR;  Service: Open Heart Surgery;  Laterality: N/A;    Prior to Admission medications   Medication Sig Start Date End Date Taking? Authorizing Provider  ALPRAZolam Prudy Feeler) 1 MG tablet Take 1 mg by mouth 4 (four) times daily.   Yes Historical Provider, MD  amitriptyline (ELAVIL) 25 MG tablet Take 25 mg by mouth at bedtime.   Yes Historical Provider, MD  amLODipine (NORVASC) 5 MG tablet Take 5 mg by mouth daily.   Yes Historical Provider, MD  aspirin EC 81 MG tablet Take 81 mg by mouth daily.   Yes Historical Provider, MD  hydrochlorothiazide (MICROZIDE) 12.5 MG capsule Take 12.5 mg by mouth daily.   Yes Historical Provider, MD  HYDROcodone-acetaminophen (NORCO) 10-325 MG per tablet Take 1 tablet by mouth every 6 (six) hours as needed. Pain   Yes Historical Provider, MD  insulin glargine (LANTUS) 100 UNIT/ML injection Inject 60 Units into the skin at bedtime.   Yes Historical Provider, MD  levothyroxine (SYNTHROID, LEVOTHROID) 75 MCG tablet Take 75 mcg by mouth daily before breakfast.   Yes Historical Provider, MD  lovastatin (MEVACOR) 40 MG tablet Take 40 mg by mouth at bedtime.   Yes Historical Provider, MD  metoprolol succinate (TOPROL-XL) 50 MG 24 hr tablet Take 50 mg by mouth daily. Take with or immediately following a meal.   Yes Historical Provider, MD  omeprazole (PRILOSEC) 20 MG capsule Take 20 mg by mouth daily.   Yes Historical Provider, MD  quinapril (ACCUPRIL) 40 MG tablet Take 40 mg by mouth daily.   Yes Historical Provider, MD    Current Facility-Administered Medications  Medication Dose Route Frequency Provider Last Rate Last Dose  . 0.9 %  sodium chloride infusion   Intravenous Continuous Ward Givens, MD   20 mL/hr at 01/25/14 1847  . 0.9 %  sodium chloride infusion  250 mL  Intravenous Continuous Erin Barrett, PA-C 10 mL/hr at 02/10/14 0800 250 mL at 02/10/14 0800  . acetylcysteine (MUCOMYST) 20 % nebulizer / oral solution 4 mL  4 mL Nebulization 6 times per day Lonia Farber, MD   4 mL at 02/10/14 0736  . amiodarone (NEXTERONE PREMIX) 360 MG/200ML (1.8 mg/mL) IV infusion  20 mg/hr Intravenous Continuous Kerin Perna, MD 11.1 mL/hr at 02/10/14 0600 20 mg/hr at 02/10/14 0600  . antiseptic oral rinse (BIOTENE) solution 15 mL  15 mL Mouth Rinse Q2H Kerin Perna, MD   15 mL at 02/10/14 1000  . aspirin EC tablet 325 mg  325 mg Oral Daily Erin Barrett, PA-C   325 mg at 02/07/14 0946  . bisacodyl (DULCOLAX) EC tablet 10 mg  10 mg Oral Daily Erin Barrett, PA-C   10 mg at 02/07/14 9794   Or  . bisacodyl (DULCOLAX) suppository 10 mg  10 mg Rectal Daily Erin Barrett, PA-C   10 mg at 01/31/14 1019  . chlorhexidine (PERIDEX) 0.12 % solution 15 mL  15 mL Mouth Rinse BID Kerin Perna, MD   15 mL at 02/10/14 0855  . enoxaparin (LOVENOX) injection 30 mg  30 mg Subcutaneous Q24H Kerin Perna, MD   30 mg at 02/09/14 1738  . feeding supplement (NEPRO CARB STEADY) liquid 1,000 mL  1,000 mL Oral  Continuous Kerin Perna, MD 30 mL/hr at 02/10/14 1000 1,000 mL at 02/10/14 1000  . fluconazole (DIFLUCAN) IVPB 100 mg  100 mg Intravenous Q24H Kerin Perna, MD   100 mg at 02/10/14 0908  . insulin aspart (novoLOG) injection 0-24 Units  0-24 Units Subcutaneous 6 times per day Alleen Borne, MD   4 Units at 02/10/14 0855  . insulin glargine (LANTUS) injection 20 Units  20 Units Subcutaneous BID Kerin Perna, MD      . levalbuterol [facility]) nebulizer solution 1.25 mg  1.25 mg Nebulization 6 times per day Lonia Farber, MD   1.25 mg at 02/10/14 0736  . levothyroxine (SYNTHROID, LEVOTHROID) injection 37.5 mcg  37.5 mcg Intravenous QAC breakfast Kerin Perna, MD   37.5 mcg at 02/09/14 0803  . milrinone (PRIMACOR) infusion 200 mcg/mL (0.2 mg/ml)  0.25  mcg/kg/min Intravenous Continuous Kerin Perna, MD 7.9 mL/hr at 02/10/14 0800 0.25 mcg/kg/min at 02/10/14 0800  . norepinephrine (LEVOPHED) 4 mg in dextrose 5 % 250 mL infusion  2-8 mcg/min Intravenous Titrated Kerin Perna, MD      . ondansetron [facility] injection 4 mg  4 mg Intravenous Q6H PRN Erin Barrett, PA-C      . pantoprazole (PROTONIX) injection 40 mg  40 mg Intravenous Q24H Merwyn Katos, MD   40 mg at 02/10/14 0945  . piperacillin-tazobactam (ZOSYN) IVPB 2.25 g  2.25 g Intravenous Q6H Drake Leach Rumbarger, RPH   2.25 g at 02/10/14 9528  . sodium chloride 0.9 % injection 10-40 mL  10-40 mL Intracatheter Q12H Kerin Perna, MD   10 mL at 02/09/14 2131  . sodium chloride 0.9 % injection 10-40 mL  10-40 mL Intracatheter PRN Kerin Perna, MD      . vancomycin [facility]) 1,500 mg in sodium chloride 0.9 % 500 mL IVPB  1,500 mg Intravenous Q48H Kerin Perna, MD   1,500 mg at 02/09/14 1953    Allergies as of 01/25/2014  . (No Known Allergies)    Family History  Problem Relation Age of Onset  . Heart attack Mother   . Heart disease Mother   . Heart attack Father   . Heart disease Father     History   Social History  . Marital Status: Widowed    Spouse Name: N/A    Number of Children: N/A  . Years of Education: N/A   Occupational History  . Not on file.   Social History Main Topics  . Smoking status: Never Smoker   . Smokeless tobacco: Never Used  . Alcohol Use: No  . Drug Use: No  . Sexual Activity: Not on file   Other Topics Concern  . Not on file   Social History Narrative   She recently moved to [place] where she is living with her son or grandson.   She is widowed, and currently grieving over loss of her 25 year old son who suffered a massive MI with cardiac arrest. She is partially able to care for self but has a hard time keeping up with her medications.    Review of Systems: Gen: Denies any fever, chills, sweats, anorexia, fatigue,  weakness, malaise, weight loss, and sleep disorder HEENT: No visual complaints, No history of Retinopathy. Normal external appearance No Epistaxis or Sore throat. No sinusitis.   CV: Denies chest pain, angina, palpitations, syncope, orthopnea, PND, peripheral edema, and claudication. 4 V CABG  Resp: Extubated GI: Denies vomiting blood, jaundice, and fecal incontinence.  Denies dysphagia or odynophagia. GU : Foley placed  MS: No use of non steroidal antiinflammatory drugs. Derm: Denies rash, itching, dry skin, hives, moles, warts, or unhealing ulcers.  Heme: stable Neuro: No history of CVA.  No Seizures. No paresthesias.  No weakness. Endocrine  DM. Thyroid disease.  No Adrenal disease.  Physical Exam: Vital signs in last 24 hours: Temp:  [96.7 F (35.9 C)-99.3 F (37.4 C)] 99.3 F (37.4 C) (07/28 0800) Pulse Rate:  [86-90] 87 (07/28 0800) Resp:  [13-34] 34 (07/28 0800) BP: (87-124)/(49-78) 104/51 mmHg (07/28 0800) SpO2:  [93 %-100 %] 95 % (07/28 0800) FiO2 (%):  [30 %-100 %] 40 % (07/28 0745) Weight:  [102 kg (224 lb 13.9 oz)] 102 kg (224 lb 13.9 oz) (07/28 0500) Last BM Date: 02/09/14 General:   Ill Appearing Head:  Normocephalic and atraumatic. Eyes:  Sclera clear, no icterus.   Conjunctiva pink. Ears:  Normal auditory acuity. Nose:  No deformity, discharge,  or lesions. Mouth:  No deformity or lesions, dentition normal. Neck:  Supple; no masses or thyromegaly. JVP not elevated Lungs:  Clear anteriorly  Heart:  Regular rate and rhythm; no murmurs, clicks, rubs,  or gallops. Abdomen:  Soft, nontender and nondistended. No masses, hepatosplenomegaly or hernias noted. Normal bowel sounds, without guarding, and without rebound.   Msk:  Symmetrical without gross deformities. Normal posture. Pulses:  No carotid, renal, femoral bruits. DP and PT symmetrical and equal Extremities:  Without clubbing or edema. Neurologic:  Alert and  oriented x4;  grossly normal neurologically. Skin:   Intact without significant lesions or rashes.   Intake/Output from previous day: 07/27 0701 - 07/28 0700 In: 1736.5 [I.V.:713.5; NG/GT:173; IV Piggyback:850] Out: 617 [Urine:427; Chest Tube:190] Intake/Output this shift: Total I/O In: 167.9 [I.V.:17.9; NG/GT:50; IV Piggyback:100] Out: 135 [Urine:25; Chest Tube:110]  Lab Results:  Recent Labs  02/08/14 0330 02/09/14 0330 02/10/14 0455  WBC 16.0* 17.1* 16.7*  HGB 10.3* 10.3* 9.4*  HCT 31.8* 32.0* 29.0*  PLT 374 429* 373   BMET  Recent Labs  02/08/14 0330 02/09/14 0330 02/10/14 0455  NA 136* 134* 133*  K 4.1 4.8 3.6*  CL 88* 86* 86*  CO2 34* 29 26  GLUCOSE 92 165* 198*  BUN 88* 103* 111*  CREATININE 1.92* 2.47* 3.05*  CALCIUM 8.7 8.5 8.4   LFT  Recent Labs  02/10/14 0455  PROT 6.6  ALBUMIN 2.7*  AST 43*  ALT 28  ALKPHOS 152*  BILITOT 1.4*   PT/INR No results found for this basename: LABPROT, INR,  in the last 72 hours Hepatitis Panel No results found for this basename: HEPBSAG, HCVAB, HEPAIGM, HEPBIGM,  in the last 72 hours  Studies/Results: Ct Chest Wo Contrast  02/09/2014   CLINICAL DATA:  History of CABG and mitral valve replacement complicated by subsequent respiratory failure, now with large symptomatic left-sided pleural effusion. Please evaluate left-sided pleural fluid prior to potential CT-guided left-sided percutaneous drainage catheter placement.  EXAM: CT CHEST WITHOUT CONTRAST  TECHNIQUE: Multidetector CT imaging of the chest was performed following the standard protocol without IV contrast.  COMPARISON:  None.  FINDINGS: Evaluation of the pulmonary parenchyma is degraded secondary to patient respiratory artifact.  There is a small right and moderate-sized left-sided pleural effusions with associated atelectasis/collapse of the left lower lobe and lingula as well as partial atelectasis of the right lower lobe. Ill-defined calcifications within the collapsed left lower lobe may represent granulomas.  No discrete pulmonary nodules given the limitations of this examination.  Scattered mediastinal lymph nodes individually not enlarged by size criteria with index precarinal lymph node measuring 0.8 cm in greatest short axis diameter. No definite mediastinal, hilar or axillary lymphadenopathy on this noncontrast examination.  Endotracheal to tip terminates superior to the carina. No pneumothorax. Right upper extremity approach PICC line tip terminates within the superior cavoatrial junction.  Post median sternotomy and CABG with extensive calcifications within the native coronary arteries. Epicardial pacing wires persist. There is a very minimal amount of pericardial thickening, presumably postoperative in nature. Post mitral valve replacement with extensive calcifications within the mitral valve annulus. Scattered atherosclerotic plaque within a normal caliber thoracic aorta. The left vertebral artery is incidentally noted to arise directly from the aortic arch.  Enteric tube tip terminates within the mid body of the stomach. There is a radiopaque structure seen within the left upper abdomen, incompletely imaged. There is a small amount of intra-abdominal ascites. There is an ill-defined approximately 2.2 x 1.6 cm hypo attenuating lesion within the mid body of the spleen (image 74, series 2), which is incompletely characterized on this noncontrast examination though possibly representative of a splenic cyst and/or hemangioma. Exuberant calcifications within the splenic artery. Punctate calcifications within the dome of the right lobe of the liver, the sequela of prior granulomatous infection.  No acute or aggressive osseous abnormalities. The thyroid gland appears heterogeneous and possibly minimally enlarged post incompletely imaged on the present examination.  IMPRESSION: 1. Small right and moderate left-sided pleural effusions with associated bilateral atelectasis/collapse, left greater than right. 2. Post median  sternotomy, CABG and mitral valve replacement without evidence of complication on this noncontrast examination. 3. Appropriately positioned support apparatus.  No pneumothorax. 4. Small amount of ascites seen within the imaged upper abdomen.   Electronically Signed   By: Simonne Come M.D.   On: 02/09/2014 16:26   Dg Chest Port 1 View  02/10/2014   CLINICAL DATA:  Status post chest tube placement  EXAM: PORTABLE CHEST - 1 VIEW  COMPARISON:  02/09/2014  FINDINGS: An endotracheal tube is noted 3.7 cm above the carina. Feeding catheter extends into the stomach. The left-sided chest tube is now seen near complete reduction of the left pleural effusion. No pneumothorax is noted. Left basilar atelectasis is noted. A small right-sided pleural effusion seen. A PICC line is noted in the mid superior vena cava. Postsurgical changes are again noted.  IMPRESSION: Significant reduction in left-sided pleural effusion. Persistent left basilar atelectasis is noted. Small right-sided pleural effusion remains.  Tubes and lines as described.   Electronically Signed   By: Alcide Clever M.D.   On: 02/10/2014 07:41   Dg Chest Port 1 View  02/09/2014   CLINICAL DATA:  Hypoxia  EXAM: PORTABLE CHEST - 1 VIEW  COMPARISON:  Study obtained earlier in the day  FINDINGS: Endotracheal tube tip is 3.5 cm above the carina. Central catheter tip is in the superior vena cava. No pneumothorax.  There is a sizable effusion on the left which appears slightly smaller compared to earlier in the day. There is atelectatic change throughout much of the left lung is well. There may be superimposed airspace consolidation on the left. There is a much smaller effusion on the right, stable. Right lung is otherwise clear.  Heart is mildly enlarged. 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 No adenopathy is apparent. The pulmonary vascularity is within normal limits.  IMPRESSION: Tube and catheter positions as described  without pneumothorax. Bilateral effusions, much  larger on the left than on the right. Effusion on the left is slightly smaller compared to earlier in the day. There is extensive atelectatic change on the left. There may be some airspace consolidation superimposed with effusion on the left as well. No change in cardiac silhouette.   Electronically Signed   By: Bretta Bang M.D.   On: 02/09/2014 10:12   Dg Chest Port 1 View  02/09/2014   CLINICAL DATA:  Respiratory failure.  EXAM: PORTABLE CHEST - 1 VIEW  COMPARISON:  02/07/2014.  FINDINGS: Interim removal of endotracheal tube and NG tube. Right PICC line in stable position. Progressive opacification of the left hemi thorax is present. This could be from pulmonary infiltrate and/or effusion. Persistent right lower lobe infiltrate and right pleural effusion noted. Cardiomegaly. Prior median sternotomy. CABG. Cardiac valve replacement. No pneumothorax. No acute bony abnormality.  IMPRESSION: 1. PICC line in stable position. Interim removal of endotracheal tube and NGT. 2. Complete opacification of the left hemithorax. This could be from underlying pulmonary infiltrate and/or pleural effusion. 3. Persistent right pulmonary infiltrate and small right pleural effusion. 4. Cardiomegaly. Prior CABG and cardiac valve replacement. A component of congestive heart failure may be present.   Electronically Signed   By: Maisie Fus  Register   On: 02/09/2014 07:48   Dg Abd Portable 1v  02/10/2014   CLINICAL DATA:  Feeding tube placement  EXAM: PORTABLE ABDOMEN - 1 VIEW  COMPARISON:  Abdominal film of February 09, 2014  FINDINGS: The radiodense tip of the feeding tube lies now in the proximal jejunum. No abnormal bowel loops are demonstrated.  IMPRESSION: The feeding tube tip lies in the region of the proximal jejunum.   Electronically Signed   By: David  [place]   On: 02/10/2014 07:47   Dg Abd Portable 1v  02/09/2014   CLINICAL DATA:  Feeding tube placement  EXAM: PORTABLE  ABDOMEN - 1 VIEW  COMPARISON:  February 01, 2014  FINDINGS: Feeding tube tip is below the diaphragm. It is difficult to ascertain whether the tip is in the mid to distal stomach versus third duodenum. Bowel gas pattern is unremarkable. Temporary pacemaker wires extend to the right heart. There is left pleural effusion.  IMPRESSION: It is difficult to ascertain whether the feeding tube tip is in the mid to distal stomach versus mid duodenum. If this differentiation will affect patient management, a small contrast injection through the tube with image confirmation could distinguish whether tube tip is in stomach versus duodenum. Bowel gas pattern unremarkable.   Electronically Signed   By: Bretta Bang M.D.   On: 02/09/2014 12:51   Ct Perc Pleural Drain W/indwell Cath W/img Guide  02/09/2014   INDICATION: Post median sternotomy, CABG and mitral valve replacement, now with persistent symptomatic left-sided pleural effusion. Please perform CT-guided drainage left chest tube placement.  EXAM: CT PERC PLEURAL DRAIN W/INDWELL CATH W/IMG GUIDE  COMPARISON:  Chest CT- earlier same day  MEDICATIONS: The patient is currently admitted to the hospital and receiving intravenous antibiotics. The antibiotics were administered within an appropriate time frame prior to the initiation of the procedure.  ANESTHESIA/SEDATION: None  CONTRAST:  None  COMPLICATIONS: None immediate  PROCEDURE: Informed written consent was obtained from the patient's family after a discussion of the risks, benefits and alternatives to treatment. The patient was placed supine on the CT gantry and a pre procedural CT was performed re-demonstrating the known moderate to large sized left-sided pleural effusion. The procedure was planned. A timeout was  performed prior to the initiation of the procedure.  The skin overlying the anterior lateral aspect of the left inferior chest was prepped and draped in the usual sterile fashion. The overlying soft tissues  were anesthetized with 1% lidocaine with epinephrine. Appropriate trajectory was planned with the use of a 22 gauge spinal needle. An 18 gauge trocar needle was advanced into the left pleural space and a short Amplatz super stiff wire was coiled within the collection. Appropriate positioning was confirmed with a limited CT scan. The tract was serially dilated allowing placement of a 12 [place] all-purpose drainage catheter. Appropriate positioning was confirmed with a limited postprocedural CT scan.  Approximately 1.3 L Ml of brown, slightly red colored pleural fluid was aspirated. The tube was connected to a drainage bag and sutured in place. A dressing was placed. The patient tolerated the procedure well without immediate post procedural complication.  IMPRESSION: Successful CT guided placement of a 3712 French all purpose drain catheter into the left pleural space with aspiration of approximately 1.3 L of pleural fluid. Samples were sent to the laboratory as requested by the ordering clinical team.   Electronically Signed   By: Simonne ComeJohn  Watts M.D.   On: 02/09/2014 16:30    Assessment/Plan:  Acute kidney injury appears to have declined with hypotension and shock although obstruction and Acute interstitial nephritis cannot be excluded. Will order urinalysis and urine na . Check renal ultrasound to rule out hydronephrosis. I would be cautious with vancomycin dosing. There does not appear to be an urgent indications for dialysis at this point and I doubt that the patient will tolerate intermittent dialysis . In this case CRRT will need to be started  Electrolytes stable  K 3.6 and sodium 133  Acid / base  Bicarbonate stable  HTN/Volume  hypotension  Continue pressors    LOS: 16 Kmari Halter W @TODAY @10 :43 AM

## 2014-02-10 NOTE — Procedures (Signed)
Arterial Catheter Insertion Procedure Note [name] [id number] [birth year]  Procedure: Insertion of Arterial Catheter  Indications: Blood pressure monitoring and Frequent blood sampling  Procedure Details Consent: Risks of procedure as well as the alternatives and risks of each were explained to the (patient/caregiver).  Consent for procedure obtained. Time Out: Verified patient identification, verified procedure, site/side was marked, verified correct patient position, special equipment/implants available, medications/allergies/relevent history reviewed, required imaging and test results available.  Performed  Maximum sterile technique was used including antiseptics, cap, gloves, gown, hand hygiene, mask and sheet. Skin prep: Chlorhexidine; local anesthetic administered 20 gauge catheter was inserted into left radial artery using the Seldinger technique.  Evaluation Blood flow good; BP tracing good. Complications: No apparent complications.   Lysbeth [facility] 02/10/2014

## 2014-02-10 NOTE — Progress Notes (Signed)
Subjective: 73 y.o. female admitted with STEMI s/p emergent CABG and MVR complicated by respiratory failure. Patient has been re-intubated. Large left pleural effusion noted on CXR s/p left sided chest tube placed 7/27 with removal of 1.3 liters 7/27. Patient remains intubated today.   Objective: Physical Exam: BP 104/51  Pulse 87  Temp(Src) 99.3 F (37.4 C) (Axillary)  Resp 34  Ht 5\' 7"  (1.702 m)  Wt 224 lb 13.9 oz (102 kg)  BMI 35.21 kg/m2  SpO2 95%  General: Intubated. Lungs: Left chest tube intact, dressing C/D, 325 cc output, diffuse rhonchi.  Labs: CBC  Recent Labs  02/09/14 0330 02/09/14 1743 02/10/14 0455  WBC 17.1*  --  16.7*  HGB 10.3* 11.6* 9.4*  HCT 32.0* 34.0* 29.0*  PLT 429*  --  373   BMET  Recent Labs  02/09/14 0330 02/09/14 1743 02/10/14 0455  NA 134* 131* 133*  K 4.8 3.9 3.6*  CL 86* 88* 86*  CO2 29  --  26  GLUCOSE 165* 164* 198*  BUN 103* 109* 111*  CREATININE 2.47* 3.40* 3.05*  CALCIUM 8.5  --  8.4   LFT  Recent Labs  02/10/14 0455  PROT 6.6  ALBUMIN 2.7*  AST 43*  ALT 28  ALKPHOS 152*  BILITOT 1.4*   PT/INR No results found for this basename: LABPROT, INR,  in the last 72 hours   Studies/Results: Ct Chest Wo Contrast  02/09/2014   CLINICAL DATA:  History of CABG and mitral valve replacement complicated by subsequent respiratory failure, now with large symptomatic left-sided pleural effusion. Please evaluate left-sided pleural fluid prior to potential CT-guided left-sided percutaneous drainage catheter placement.  EXAM: CT CHEST WITHOUT CONTRAST  TECHNIQUE: Multidetector CT imaging of the chest was performed following the standard protocol without IV contrast.  COMPARISON:  None.  FINDINGS: Evaluation of the pulmonary parenchyma is degraded secondary to patient respiratory artifact.  There is a small right and moderate-sized left-sided pleural effusions with associated atelectasis/collapse of the left lower lobe and lingula as well  as partial atelectasis of the right lower lobe. Ill-defined calcifications within the collapsed left lower lobe may represent granulomas. No discrete pulmonary nodules given the limitations of this examination.  Scattered mediastinal lymph nodes individually not enlarged by size criteria with index precarinal lymph node measuring 0.8 cm in greatest short axis diameter. No definite mediastinal, hilar or axillary lymphadenopathy on this noncontrast examination.  Endotracheal to tip terminates superior to the carina. No pneumothorax. Right upper extremity approach PICC line tip terminates within the superior cavoatrial junction.  Post median sternotomy and CABG with extensive calcifications within the native coronary arteries. Epicardial pacing wires persist. There is a very minimal amount of pericardial thickening, presumably postoperative in nature. Post mitral valve replacement with extensive calcifications within the mitral valve annulus. Scattered atherosclerotic plaque within a normal caliber thoracic aorta. The left vertebral artery is incidentally noted to arise directly from the aortic arch.  Enteric tube tip terminates within the mid body of the stomach. There is a radiopaque structure seen within the left upper abdomen, incompletely imaged. There is a small amount of intra-abdominal ascites. There is an ill-defined approximately 2.2 x 1.6 cm hypo attenuating lesion within the mid body of the spleen (image 74, series 2), which is incompletely characterized on this noncontrast examination though possibly representative of a splenic cyst and/or hemangioma. Exuberant calcifications within the splenic artery. Punctate calcifications within the dome of the right lobe of the liver, the sequela of prior granulomatous  infection.  No acute or aggressive osseous abnormalities. The thyroid gland appears heterogeneous and possibly minimally enlarged post incompletely imaged on the present examination.  IMPRESSION: 1.  Small right and moderate left-sided pleural effusions with associated bilateral atelectasis/collapse, left greater than right. 2. Post median sternotomy, CABG and mitral valve replacement without evidence of complication on this noncontrast examination. 3. Appropriately positioned support apparatus.  No pneumothorax. 4. Small amount of ascites seen within the imaged upper abdomen.   Electronically Signed   By: Simonne Come M.D.   On: 02/09/2014 16:26   Dg Chest Port 1 View  02/10/2014   CLINICAL DATA:  Status post chest tube placement  EXAM: PORTABLE CHEST - 1 VIEW  COMPARISON:  02/09/2014  FINDINGS: An endotracheal tube is noted 3.7 cm above the carina. Feeding catheter extends into the stomach. The left-sided chest tube is now seen near complete reduction of the left pleural effusion. No pneumothorax is noted. Left basilar atelectasis is noted. A small right-sided pleural effusion seen. A PICC line is noted in the mid superior vena cava. Postsurgical changes are again noted.  IMPRESSION: Significant reduction in left-sided pleural effusion. Persistent left basilar atelectasis is noted. Small right-sided pleural effusion remains.  Tubes and lines as described.   Electronically Signed   By: Alcide Clever M.D.   On: 02/10/2014 07:41   Dg Chest Port 1 View  02/09/2014   CLINICAL DATA:  Hypoxia  EXAM: PORTABLE CHEST - 1 VIEW  COMPARISON:  Study obtained earlier in the day  FINDINGS: Endotracheal tube tip is 3.5 cm above the carina. Central catheter tip is in the superior vena cava. No pneumothorax.  There is a sizable effusion on the left which appears slightly smaller compared to earlier in the day. There is atelectatic change throughout much of the left lung is well. There may be superimposed airspace consolidation on the left. There is a much smaller effusion on the right, stable. Right lung is otherwise clear.  Heart is mildly enlarged. Patient is status post mitral valve replacement and coronary artery bypass  grafting. No adenopathy is apparent. The pulmonary vascularity is within normal limits.  IMPRESSION: Tube and catheter positions as described without pneumothorax. Bilateral effusions, much larger on the left than on the right. Effusion on the left is slightly smaller compared to earlier in the day. There is extensive atelectatic change on the left. There may be some airspace consolidation superimposed with effusion on the left as well. No change in cardiac silhouette.   Electronically Signed   By: Bretta Bang M.D.   On: 02/09/2014 10:12   Dg Chest Port 1 View  02/09/2014   CLINICAL DATA:  Respiratory failure.  EXAM: PORTABLE CHEST - 1 VIEW  COMPARISON:  02/07/2014.  FINDINGS: Interim removal of endotracheal tube and NG tube. Right PICC line in stable position. Progressive opacification of the left hemi thorax is present. This could be from pulmonary infiltrate and/or effusion. Persistent right lower lobe infiltrate and right pleural effusion noted. Cardiomegaly. Prior median sternotomy. CABG. Cardiac valve replacement. No pneumothorax. No acute bony abnormality.  IMPRESSION: 1. PICC line in stable position. Interim removal of endotracheal tube and NGT. 2. Complete opacification of the left hemithorax. This could be from underlying pulmonary infiltrate and/or pleural effusion. 3. Persistent right pulmonary infiltrate and small right pleural effusion. 4. Cardiomegaly. Prior CABG and cardiac valve replacement. A component of congestive heart failure may be present.   Electronically Signed   By: Maisie Fus  Register   On:  02/09/2014 07:48   Dg Abd Portable 1v  02/10/2014   CLINICAL DATA:  Feeding tube placement  EXAM: PORTABLE ABDOMEN - 1 VIEW  COMPARISON:  Abdominal film of February 09, 2014  FINDINGS: The radiodense tip of the feeding tube lies now in the proximal jejunum. No abnormal bowel loops are demonstrated.  IMPRESSION: The feeding tube tip lies in the region of the proximal jejunum.   Electronically  Signed   By: David  [place]   On: 02/10/2014 07:47   Dg Abd Portable 1v  02/09/2014   CLINICAL DATA:  Feeding tube placement  EXAM: PORTABLE ABDOMEN - 1 VIEW  COMPARISON:  February 01, 2014  FINDINGS: Feeding tube tip is below the diaphragm. It is difficult to ascertain whether the tip is in the mid to distal stomach versus third duodenum. Bowel gas pattern is unremarkable. Temporary pacemaker wires extend to the right heart. There is left pleural effusion.  IMPRESSION: It is difficult to ascertain whether the feeding tube tip is in the mid to distal stomach versus mid duodenum. If this differentiation will affect patient management, a small contrast injection through the tube with image confirmation could distinguish whether tube tip is in stomach versus duodenum. Bowel gas pattern unremarkable.   Electronically Signed   By: Bretta Bang M.D.   On: 02/09/2014 12:51   Ct Perc Pleural Drain W/indwell Cath W/img Guide  02/09/2014   INDICATION: Post median sternotomy, CABG and mitral valve replacement, now with persistent symptomatic left-sided pleural effusion. Please perform CT-guided drainage left chest tube placement.  EXAM: CT PERC PLEURAL DRAIN W/INDWELL CATH W/IMG GUIDE  COMPARISON:  Chest CT- earlier same day  MEDICATIONS: The patient is currently admitted to the hospital and receiving intravenous antibiotics. The antibiotics were administered within an appropriate time frame prior to the initiation of the procedure.  ANESTHESIA/SEDATION: None  CONTRAST:  None  COMPLICATIONS: None immediate  PROCEDURE: Informed written consent was obtained from the patient's family after a discussion of the risks, benefits and alternatives to treatment. The patient was placed supine on the CT gantry and a pre procedural CT was performed re-demonstrating the known moderate to large sized left-sided pleural effusion. The procedure was planned. A timeout was performed prior to the initiation of the procedure.  The skin  overlying the anterior lateral aspect of the left inferior chest was prepped and draped in the usual sterile fashion. The overlying soft tissues were anesthetized with 1% lidocaine with epinephrine. Appropriate trajectory was planned with the use of a 22 gauge spinal needle. An 18 gauge trocar needle was advanced into the left pleural space and a short Amplatz super stiff wire was coiled within the collection. Appropriate positioning was confirmed with a limited CT scan. The tract was serially dilated allowing placement of a 12 [place] all-purpose drainage catheter. Appropriate positioning was confirmed with a limited postprocedural CT scan.  Approximately 1.3 L Ml of brown, slightly red colored pleural fluid was aspirated. The tube was connected to a drainage bag and sutured in place. A dressing was placed. The patient tolerated the procedure well without immediate post procedural complication.  IMPRESSION: Successful CT guided placement of a 29 French all purpose drain catheter into the left pleural space with aspiration of approximately 1.3 L of pleural fluid. Samples were sent to the laboratory as requested by the ordering clinical team.   Electronically Signed   By: Simonne Come M.D.   On: 02/09/2014 16:30    Assessment/Plan: STEMI s/p emergent CABG and MVR  complicated by respiratory failure on ventilator Large left pleural effusion s/p left sided chest tube placed 7/27 with 1.3 liters removed and today 325 cc, Cx no growth/pending CXR reviewed from today with significant reduction in left-sided pleural effusion, follow.  IR will follow.     LOS: 16 days    Berneta Levins PA-C 02/10/2014 3:00 PM

## 2014-02-10 NOTE — Progress Notes (Signed)
PULMONARY / CRITICAL CARE MEDICINE  Name: [name] MRN: [id number] DOB: 17-May-1941    ADMISSION DATE:  01/25/2014 CONSULTATION DATE:  02/10/2014  REFERRING MD :  Tyrone SageGerhardt  CHIEF COMPLAINT:  Hypoxemia  INITIAL PRESENTATION: 73 yo admitted 7/12 with STEMI. Taken to cath lab but unable to perform PCI due to significant RCA disease. Taken to OR emergently for CABG x 4.  Returned to SICU mechanically ventilated.   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 reintubated 7/27 FOB: no airway obstruction noted 7/27 CT chest: large L effusion, moderate R effusion 7/27 CT guided pleural catheter placed by IR. 1300 cc brown, slightly red colored pleural fluid 7/28 Renal consult:    INTERVAL HISTORY:   RASS -1. Eyes open and regards examiner but not F/C  VITAL SIGNS: Temp:  [96.7 F (35.9 C)-99.3 F (37.4 C)] 99.3 F (37.4 C) (07/28 0800) Pulse Rate:  [86-90] 87 (07/28 0800) Resp:  [17-34] 34 (07/28 0800) BP: (87-124)/(49-78) 104/51 mmHg (07/28 0800) SpO2:  [93 %-100 %] 95 % (07/28 0800) FiO2 (%):  [30 %-100 %] 40 % (07/28 0745) Weight:  [102 kg (224 lb 13.9 oz)] 102 kg (224 lb 13.9 oz) (07/28 0500)  HEMODYNAMICS: CVP:  [12 mmHg-19 mmHg] 19 mmHg  VENTILATOR SETTINGS: Vent Mode:  [-] PRVC FiO2 (%):  [30 %-100 %] 40 % Set Rate:  [15 bmp] 15 bmp Vt Set:  [500 mL] 500 mL PEEP:  [5 cmH20] 5 cmH20 Plateau Pressure:  [21 cmH20-27 cmH20] 21 cmH20  INTAKE / OUTPUT: Intake/Output     07/27 0701 - 07/28 0700 07/28 0701 - 07/29 0700   I.V. (mL/kg) 713.5 (7) 17.9 (0.2)   NG/GT 173 50   IV Piggyback 850 100   Total Intake(mL/kg) 1736.5 (17) 167.9 (1.6)   Urine (mL/kg/hr) 427 (0.2) 25 (0.1)   Chest Tube 190 (0.1) 110 (0.2)   Total Output 617 135   Net +1119.5 +32.9        Stool  Occurrence 2 x     PHYSICAL EXAMINATION: General: RASS -1. Not F/C Neuro: MAEs HEENT: WNL Cardiovascular: paced, HS obscured  Lungs: no wheezes, diffuse rhonchi, tachypnea and high Ve noted Abdomen: Soft, non tender, bowel sounds present Ext: Bilateral symmetric  pitting edema  LABS: CBC  Recent Labs Lab 02/08/14 0330 02/09/14 0330 02/10/14 0455  WBC 16.0* 17.1* 16.7*  HGB 10.3* 10.3* 9.4*  HCT 31.8* 32.0* 29.0*  PLT 374 429* 373   Coag's  Recent Labs Lab 02/08/14 0730 02/08/14 1600 02/09/14 0330  APTT 115* 88* 113*   BMET  Recent Labs Lab 02/08/14 0330 02/09/14 0330 02/10/14 0455  NA 136* 134* 133*  K 4.1 4.8 3.6*  CL 88* 86* 86*  CO2 34* 29 26  BUN 88* 103* 111*  CREATININE 1.92* 2.47* 3.05*  GLUCOSE 92 165* 198*   Electrolytes  Recent Labs Lab 02/05/14 0430 02/06/14 0435 02/07/14 0400 02/08/14 0330 02/09/14 0330 02/10/14 0455  CALCIUM 8.0* 8.5 8.7 8.7 8.5 8.4  MG 1.6 1.7 2.1  --   --   --   PHOS 3.1 3.6 4.2  --   --   --    Sepsis Markers No results found for this basename: LATICACIDVEN, PROCALCITON, O2SATVEN,  in the last 168 hours  ABG  Recent Labs Lab 02/07/14 0437 02/08/14 0500 02/09/14 0453  PHART 7.516* 7.499* 7.503*  PCO2ART 42.7 45.3* 38.5  PO2ART 87.5 84.0 114.0*   Liver Enzymes  Recent Labs Lab 02/07/14 0400 02/08/14 0330 02/10/14 0455  AST 35 40* 43*  ALT 24 27 28   ALKPHOS 146* 165* 152*  BILITOT 1.5* 1.8* 1.4*  ALBUMIN 2.9* 2.9* 2.7*   Cardiac Enzymes No results found for this basename: TROPONINI, PROBNP,  in the last 168 hours  Glucose  Recent Labs Lab 02/09/14 1106 02/09/14 1647 02/09/14 1944 02/10/14 0010 02/10/14 0353 02/10/14 0720  GLUCAP 127* 155* 146* 194* 189* 179*   CXR: moderate R effusion, mild edema pattern, LLL atx vs inf  ASSESSMENT / PLAN:  Acute hypoxemic respiratory failure. Cont vent support - settings reviewed and/or adjusted Cont vent bundle Daily SBT if/when meets  criteria    Large L pleural effusion - s/p pleural cath drainage 7/27 Cont chest tube drainage  Pulm edema Cont diuresis as permitted by BP and renal function  AKI Renal consult 7/28 Anticipate will need CRRT   I have personally obtained history, examined patient, evaluated and interpreted laboratory and imaging results, reviewed medical records, formulated assessment / plan and placed orders.  CRITICAL CARE:  The patient is critically ill with multiple organ systems failure and requires high complexity decision making for assessment and support, frequent evaluation and titration of therapies, application of advanced monitoring technologies and extensive interpretation of multiple databases. Critical Care Time devoted to patient care services described in this note is 30 minutes.   Billy Fischer, MD Pulmonary and Critical Care Medicine [facility] Pager: [phone]  02/10/2014, 11:36 AM

## 2014-02-10 NOTE — Progress Notes (Addendum)
TCTS DAILY ICU PROGRESS NOTE                   [address]            [place] [id number]          [phone]   16 Days Post-Op Procedure(s) (LRB): CORONARY ARTERY BYPASS GRAFTING TIMES FOUR USING LEFT INTERNAL MAMMARY ARTERY TO LAD, SAPHENOUS VEIN GRAFTS TO OM1, OM2, AND PDA (N/A) MITRAL VALVE (MV) REPLACEMENT (N/A)  Total Length of Stay:  LOS: 16 days   Subjective: Intubated, opens eyes, moves extremities.   Objective: Vital signs in last 24 hours: Temp:  [96.7 F (35.9 C)-98.9 F (37.2 C)] 98.9 F (37.2 C) (07/28 0724) Pulse Rate:  [86-90] 88 (07/28 0700) Cardiac Rhythm:  [-] A-V Sequential paced (07/28 0400) Resp:  [12-32] 32 (07/28 0700) BP: (68-124)/(47-83) 101/55 mmHg (07/28 0700) SpO2:  [93 %-100 %] 93 % (07/28 0700) FiO2 (%):  [30 %-100 %] 40 % (07/28 0745) Weight:  [224 lb 13.9 oz (102 kg)] 224 lb 13.9 oz (102 kg) (07/28 0500)  Filed Weights   02/08/14 0700 02/09/14 0600 02/10/14 0500  Weight: 229 lb 0.9 oz (103.9 kg) 231 lb 11.3 oz (105.1 kg) 224 lb 13.9 oz (102 kg)    Weight change: -6 lb 13.4 oz (-3.1 kg)   Hemodynamic parameters for last 24 hours: CVP:  [12 mmHg-20 mmHg] 12 mmHg  Intake/Output from previous day: 07/27 0701 - 07/28 0700 In: 1736.5 [I.V.:713.5; NG/GT:173; IV Piggyback:850] Out: 617 [Urine:427; Chest Tube:190]  CBGs [phone]     Current Meds: Scheduled Meds: . acetylcysteine  4 mL Nebulization 6 times per day  . antiseptic oral rinse  15 mL Mouth Rinse Q2H  . aspirin EC  325 mg Oral Daily  . bisacodyl  10 mg Oral Daily   Or  . bisacodyl  10 mg Rectal Daily  . cefTAZidime (FORTAZ)  IV  1 g Intravenous Q24H  . chlorhexidine  15 mL Mouth Rinse BID  . enoxaparin (LOVENOX) injection  30 mg Subcutaneous Q24H  . insulin aspart  0-24 Units Subcutaneous 6 times per day  . levalbuterol  1.25 mg Nebulization 6 times per day  . levothyroxine  37.5 mcg Intravenous QAC breakfast  . pantoprazole (PROTONIX) IV   40 mg Intravenous Q24H  . sodium chloride  10-40 mL Intracatheter Q12H  . vancomycin  1,500 mg Intravenous Q48H   Continuous Infusions: . sodium chloride Stopped (02/01/14 0830)  . sodium chloride 250 mL (02/10/14 0600)  . amiodarone 20 mg/hr (02/10/14 0600)  . feeding supplement (NEPRO CARB STEADY) 1,000 mL (02/10/14 0600)  . milrinone 0.25 mcg/kg/min (02/10/14 0600)  . norepinephrine (LEVOPHED) Adult infusion Stopped (02/09/14 1154)   PRN Meds:.ondansetron (ZOFRAN) IV, sodium chloride   Physical Exam: General appearance: Intubated, opens eyes and moves extremities Heart: Paced at 88 Lungs: rhonchi bilaterally Abdomen: soft, non-tender; bowel sounds normal; no masses,  no organomegaly Extremities: Mild LE edema Wound: Clean and dry    Lab Results: CBC: Recent Labs  02/09/14 0330 02/10/14 0455  WBC 17.1* 16.7*  HGB 10.3* 9.4*  HCT 32.0* 29.0*  PLT 429* 373   BMET:  Recent Labs  02/09/14 0330 02/10/14 0455  NA 134* 133*  K 4.8 3.6*  CL 86* 86*  CO2 29 26  GLUCOSE 165* 198*  BUN 103* 111*  CREATININE 2.47* 3.05*  CALCIUM 8.5 8.4    PT/INR: No results found for this basename: LABPROT, INR,  in the last 72 hours Radiology: Ct Chest Wo Contrast  02/09/2014   CLINICAL DATA:  History of CABG and mitral valve replacement complicated by subsequent respiratory failure, now with large symptomatic left-sided pleural effusion. Please evaluate left-sided pleural fluid prior to potential CT-guided left-sided percutaneous drainage catheter placement.  EXAM: CT CHEST WITHOUT CONTRAST  TECHNIQUE: Multidetector CT imaging of the chest was performed following the standard protocol without IV contrast.  COMPARISON:  None.  FINDINGS: Evaluation of the pulmonary parenchyma is degraded secondary to patient respiratory artifact.  There is a small right and moderate-sized left-sided pleural effusions with associated atelectasis/collapse of the left lower lobe and lingula as well as partial  atelectasis of the right lower lobe. Ill-defined calcifications within the collapsed left lower lobe may represent granulomas. No discrete pulmonary nodules given the limitations of this examination.  Scattered mediastinal lymph nodes individually not enlarged by size criteria with index precarinal lymph node measuring 0.8 cm in greatest short axis diameter. No definite mediastinal, hilar or axillary lymphadenopathy on this noncontrast examination.  Endotracheal to tip terminates superior to the carina. No pneumothorax. Right upper extremity approach PICC line tip terminates within the superior cavoatrial junction.  Post median sternotomy and CABG with extensive calcifications within the native coronary arteries. Epicardial pacing wires persist. There is a very minimal amount of pericardial thickening, presumably postoperative in nature. Post mitral valve replacement with extensive calcifications within the mitral valve annulus. Scattered atherosclerotic plaque within a normal caliber thoracic aorta. The left vertebral artery is incidentally noted to arise directly from the aortic arch.  Enteric tube tip terminates within the mid body of the stomach. There is a radiopaque structure seen within the left upper abdomen, incompletely imaged. There is a small amount of intra-abdominal ascites. There is an ill-defined approximately 2.2 x 1.6 cm hypo attenuating lesion within the mid body of the spleen (image 74, series 2), which is incompletely characterized on this noncontrast examination though possibly representative of a splenic cyst and/or hemangioma. Exuberant calcifications within the splenic artery. Punctate calcifications within the dome of the right lobe of the liver, the sequela of prior granulomatous infection.  No acute or aggressive osseous abnormalities. The thyroid gland appears heterogeneous and possibly minimally enlarged post incompletely imaged on the present examination.  IMPRESSION: 1. Small right  and moderate left-sided pleural effusions with associated bilateral atelectasis/collapse, left greater than right. 2. Post median sternotomy, CABG and mitral valve replacement without evidence of complication on this noncontrast examination. 3. Appropriately positioned support apparatus.  No pneumothorax. 4. Small amount of ascites seen within the imaged upper abdomen.   Electronically Signed   By: Simonne Come M.D.   On: 02/09/2014 16:26   Dg Chest Port 1 View  02/10/2014   CLINICAL DATA:  Status post chest tube placement  EXAM: PORTABLE CHEST - 1 VIEW  COMPARISON:  02/09/2014  FINDINGS: An endotracheal tube is noted 3.7 cm above the carina. Feeding catheter extends into the stomach. The left-sided chest tube is now seen near complete reduction of the left pleural effusion. No pneumothorax is noted. Left basilar atelectasis is noted. A small right-sided pleural effusion seen. A PICC line is noted in the mid superior vena cava. Postsurgical changes are again noted.  IMPRESSION: Significant reduction in left-sided pleural effusion. Persistent left basilar atelectasis is noted. Small right-sided pleural effusion remains.  Tubes and lines as described.   Electronically Signed   By: Alcide Clever M.D.   On: 02/10/2014 07:41   Dg Chest [facility]  1 View  02/09/2014   CLINICAL DATA:  Hypoxia  EXAM: PORTABLE CHEST - 1 VIEW  COMPARISON:  Study obtained earlier in the day  FINDINGS: Endotracheal tube tip is 3.5 cm above the carina. Central catheter tip is in the superior vena cava. No pneumothorax.  There is a sizable effusion on the left which appears slightly smaller compared to earlier in the day. There is atelectatic change throughout much of the left lung is well. There may be superimposed airspace consolidation on the left. There is a much smaller effusion on the right, stable. Right lung is otherwise clear.  Heart is mildly enlarged. 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 No  adenopathy is apparent. The pulmonary vascularity is within normal limits.  IMPRESSION: Tube and catheter positions as described without pneumothorax. Bilateral effusions, much larger on the left than on the right. Effusion on the left is slightly smaller compared to earlier in the day. There is extensive atelectatic change on the left. There may be some airspace consolidation superimposed with effusion on the left as well. No change in cardiac silhouette.   Electronically Signed   By: Bretta Bang M.D.   On: 02/09/2014 10:12   Dg Chest Port 1 View  02/09/2014   CLINICAL DATA:  Respiratory failure.  EXAM: PORTABLE CHEST - 1 VIEW  COMPARISON:  02/07/2014.  FINDINGS: Interim removal of endotracheal tube and NG tube. Right PICC line in stable position. Progressive opacification of the left hemi thorax is present. This could be from pulmonary infiltrate and/or effusion. Persistent right lower lobe infiltrate and right pleural effusion noted. Cardiomegaly. Prior median sternotomy. CABG. Cardiac valve replacement. No pneumothorax. No acute bony abnormality.  IMPRESSION: 1. PICC line in stable position. Interim removal of endotracheal tube and NGT. 2. Complete opacification of the left hemithorax. This could be from underlying pulmonary infiltrate and/or pleural effusion. 3. Persistent right pulmonary infiltrate and small right pleural effusion. 4. Cardiomegaly. Prior CABG and cardiac valve replacement. A component of congestive heart failure may be present.   Electronically Signed   By: Maisie Fus  Register   On: 02/09/2014 07:48   Dg Abd Portable 1v  02/10/2014   CLINICAL DATA:  Feeding tube placement  EXAM: PORTABLE ABDOMEN - 1 VIEW  COMPARISON:  Abdominal film of February 09, 2014  FINDINGS: The radiodense tip of the feeding tube lies now in the proximal jejunum. No abnormal bowel loops are demonstrated.  IMPRESSION: The feeding tube tip lies in the region of the proximal jejunum.   Electronically Signed   By: David   [place]   On: 02/10/2014 07:47   Dg Abd Portable 1v  02/09/2014   CLINICAL DATA:  Feeding tube placement  EXAM: PORTABLE ABDOMEN - 1 VIEW  COMPARISON:  February 01, 2014  FINDINGS: Feeding tube tip is below the diaphragm. It is difficult to ascertain whether the tip is in the mid to distal stomach versus third duodenum. Bowel gas pattern is unremarkable. Temporary pacemaker wires extend to the right heart. There is left pleural effusion.  IMPRESSION: It is difficult to ascertain whether the feeding tube tip is in the mid to distal stomach versus mid duodenum. If this differentiation will affect patient management, a small contrast injection through the tube with image confirmation could distinguish whether tube tip is in stomach versus duodenum. Bowel gas pattern unremarkable.   Electronically Signed   By: Bretta Bang M.D.   On: 02/09/2014 12:51   Ct Perc Pleural Drain Eliezer Bottom  Cath W/img Guide  02/09/2014   INDICATION: Post median sternotomy, CABG and mitral valve replacement, now with persistent symptomatic left-sided pleural effusion. Please perform CT-guided drainage left chest tube placement.  EXAM: CT PERC PLEURAL DRAIN W/INDWELL CATH W/IMG GUIDE  COMPARISON:  Chest CT- earlier same day  MEDICATIONS: The patient is currently admitted to the hospital and receiving intravenous antibiotics. The antibiotics were administered within an appropriate time frame prior to the initiation of the procedure.  ANESTHESIA/SEDATION: None  CONTRAST:  None  COMPLICATIONS: None immediate  PROCEDURE: Informed written consent was obtained from the patient's family after a discussion of the risks, benefits and alternatives to treatment. The patient was placed supine on the CT gantry and a pre procedural CT was performed re-demonstrating the known moderate to large sized left-sided pleural effusion. The procedure was planned. A timeout was performed prior to the initiation of the procedure.  The skin overlying the anterior  lateral aspect of the left inferior chest was prepped and draped in the usual sterile fashion. The overlying soft tissues were anesthetized with 1% lidocaine with epinephrine. Appropriate trajectory was planned with the use of a 22 gauge spinal needle. An 18 gauge trocar needle was advanced into the left pleural space and a short Amplatz super stiff wire was coiled within the collection. Appropriate positioning was confirmed with a limited CT scan. The tract was serially dilated allowing placement of a 12 [place] all-purpose drainage catheter. Appropriate positioning was confirmed with a limited postprocedural CT scan.  Approximately 1.3 L Ml of brown, slightly red colored pleural fluid was aspirated. The tube was connected to a drainage bag and sutured in place. A dressing was placed. The patient tolerated the procedure well without immediate post procedural complication.  IMPRESSION: Successful CT guided placement of a 60 French all purpose drain catheter into the left pleural space with aspiration of approximately 1.3 L of pleural fluid. Samples were sent to the laboratory as requested by the ordering clinical team.   Electronically Signed   By: Simonne Come M.D.   On: 02/09/2014 16:30   Resp cx= MODERATE GRAM NEGATIVE RODS FEW GRAM POSITIVE RODS RARE GRAM POSITIVE COCCI IN PAIRS   Assessment/Plan: S/P Procedure(s) (LRB): CORONARY ARTERY BYPASS GRAFTING TIMES FOUR USING LEFT INTERNAL MAMMARY ARTERY TO LAD, SAPHENOUS VEIN GRAFTS TO OM1, OM2, AND PDA (N/A) MITRAL VALVE (MV) REPLACEMENT (N/A)  VDRF- Reintubated yesterday after bedside bronch. Large L effusion drained of 1.3L in IR and now with L CT in place . Appreciate CCM's assistance. Continue vent management. May ultimately need trach.  CV- Previous AF, now paced for bradycardia. BPs mostly stable. Co-ox 61.2, back on Milrinone, Amio.   Nutrition- post-pyloric tube placed, continue TFs.  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   Persistent  leukocytosis, no fever. On Fortaz D#12, Vanc D#2 for presumed pneumonia.Respiratory cx's with moderate gr - rods, few gr + rods, rare gr + cocci.  Follow up final Cx.  Postop anemia- H/H generally stable after transfusion   DM- sugars stable on Lantus.   COLLINS,GINA H 02/10/2014 7:53 AM   Patient examined and progress note reviewed as outlined above The patient is back on the ventilator following an episode of left hemithorax white out secondary to retained secretions as well as moderate to large pleural effusion-patient had bronchoscopy and the left chest drain placed yesterday She now has a low-grade fever 100.4, tracheal aspirate demonstrates both gram-positive and gram-negative organisms, antibiotic coverage has been broadend to vancomycin and Zosyn Feeding tube  hasbeen placed  post- pyloric and patient is now on tube feeds 30 cc per hour of Nepro Despite adding milrinone her creatinine continues to rise and she is oliguric--renal consult to be placed CO- Ox is 60%, last echo-postop- shows EF 55% without MR She appears to be in a junctional rhythm-continue IV amiodarone with epicardial wire wire at V.-backup Resume twice a day Lantus because tube feeds are resumed and glucose is rising   Plan Hopefully she will able to tolerate vent wean-if not she will need tracheostomy this week Advanced tube feeds to full support-nutrition consult requested Follow rising creatinine-continue milrinone and CVP monitoring

## 2014-02-11 ENCOUNTER — Inpatient Hospital Stay (HOSPITAL_COMMUNITY): Payer: Medicare Other

## 2014-02-11 DIAGNOSIS — E119 Type 2 diabetes mellitus without complications: Secondary | ICD-10-CM

## 2014-02-11 LAB — COMPREHENSIVE METABOLIC PANEL
ALT: 24 U/L (ref 0–35)
AST: 30 U/L (ref 0–37)
Albumin: 2.6 g/dL — ABNORMAL LOW (ref 3.5–5.2)
Alkaline Phosphatase: 128 U/L — ABNORMAL HIGH (ref 39–117)
Anion gap: 21 — ABNORMAL HIGH (ref 5–15)
BUN: 118 mg/dL — ABNORMAL HIGH (ref 6–23)
CO2: 27 mEq/L (ref 19–32)
Calcium: 8.5 mg/dL (ref 8.4–10.5)
Chloride: 87 mEq/L — ABNORMAL LOW (ref 96–112)
Creatinine, Ser: 3.53 mg/dL — ABNORMAL HIGH (ref 0.50–1.10)
GFR calc Af Amer: 14 mL/min — ABNORMAL LOW (ref >90)
GFR calc non Af Amer: 12 mL/min — ABNORMAL LOW (ref >90)
Glucose, Bld: 193 mg/dL — ABNORMAL HIGH (ref 70–99)
Potassium: 3.1 mEq/L — ABNORMAL LOW (ref 3.7–5.3)
Sodium: 135 mEq/L — ABNORMAL LOW (ref 137–147)
Total Bilirubin: 1.2 mg/dL (ref 0.3–1.2)
Total Protein: 6.5 g/dL (ref 6.0–8.3)

## 2014-02-11 LAB — URINALYSIS, ROUTINE W REFLEX MICROSCOPIC
BILIRUBIN URINE: NEGATIVE
Glucose, UA: NEGATIVE mg/dL
KETONES UR: NEGATIVE mg/dL
Nitrite: NEGATIVE
PH: 6 (ref 5.0–8.0)
Protein, ur: 100 mg/dL — AB
Specific Gravity, Urine: 1.02 (ref 1.005–1.030)
Urobilinogen, UA: 0.2 mg/dL (ref 0.0–1.0)

## 2014-02-11 LAB — BASIC METABOLIC PANEL
Anion gap: 19 — ABNORMAL HIGH (ref 5–15)
BUN: 96 mg/dL — ABNORMAL HIGH (ref 6–23)
CO2: 26 mEq/L (ref 19–32)
Calcium: 8.2 mg/dL — ABNORMAL LOW (ref 8.4–10.5)
Chloride: 88 mEq/L — ABNORMAL LOW (ref 96–112)
Creatinine, Ser: 2.82 mg/dL — ABNORMAL HIGH (ref 0.50–1.10)
GFR calc Af Amer: 18 mL/min — ABNORMAL LOW (ref >90)
GFR calc non Af Amer: 15 mL/min — ABNORMAL LOW (ref >90)
Glucose, Bld: 217 mg/dL — ABNORMAL HIGH (ref 70–99)
Potassium: 3.1 mEq/L — ABNORMAL LOW (ref 3.7–5.3)
Sodium: 133 mEq/L — ABNORMAL LOW (ref 137–147)

## 2014-02-11 LAB — URINE MICROSCOPIC-ADD ON

## 2014-02-11 LAB — CULTURE, RESPIRATORY W GRAM STAIN

## 2014-02-11 LAB — POCT I-STAT, CHEM 8
BUN: 108 mg/dL — ABNORMAL HIGH (ref 6–23)
Calcium, Ion: 1.04 mmol/L — ABNORMAL LOW (ref 1.13–1.30)
Chloride: 91 mEq/L — ABNORMAL LOW (ref 96–112)
Creatinine, Ser: 3.7 mg/dL — ABNORMAL HIGH (ref 0.50–1.10)
Glucose, Bld: 257 mg/dL — ABNORMAL HIGH (ref 70–99)
HCT: 42 % (ref 36.0–46.0)
Hemoglobin: 14.3 g/dL (ref 12.0–15.0)
Potassium: 3.1 mEq/L — ABNORMAL LOW (ref 3.7–5.3)
Sodium: 133 mEq/L — ABNORMAL LOW (ref 137–147)
TCO2: 22 mmol/L (ref 0–100)

## 2014-02-11 LAB — VANCOMYCIN, RANDOM: Vancomycin Rm: 16.3 ug/mL

## 2014-02-11 LAB — CBC
HCT: 28.3 % — ABNORMAL LOW (ref 36.0–46.0)
Hemoglobin: 9.3 g/dL — ABNORMAL LOW (ref 12.0–15.0)
MCH: 30.2 pg (ref 26.0–34.0)
MCHC: 32.9 g/dL (ref 30.0–36.0)
MCV: 91.9 fL (ref 78.0–100.0)
Platelets: 396 10*3/uL (ref 150–400)
RBC: 3.08 MIL/uL — ABNORMAL LOW (ref 3.87–5.11)
RDW: 18.3 % — ABNORMAL HIGH (ref 11.5–15.5)
WBC: 19.7 10*3/uL — ABNORMAL HIGH (ref 4.0–10.5)

## 2014-02-11 LAB — GLUCOSE, CAPILLARY
Glucose-Capillary: 199 mg/dL — ABNORMAL HIGH (ref 70–99)
Glucose-Capillary: 154 mg/dL — ABNORMAL HIGH (ref 70–99)
Glucose-Capillary: 180 mg/dL — ABNORMAL HIGH (ref 70–99)
Glucose-Capillary: 192 mg/dL — ABNORMAL HIGH (ref 70–99)
Glucose-Capillary: 190 mg/dL — ABNORMAL HIGH (ref 70–99)

## 2014-02-11 LAB — TSH: TSH: 6.63 u[IU]/mL — ABNORMAL HIGH (ref 0.350–4.500)

## 2014-02-11 LAB — CARBOXYHEMOGLOBIN
Carboxyhemoglobin: 1.8 % — ABNORMAL HIGH (ref 0.5–1.5)
Methemoglobin: 1.1 % (ref 0.0–1.5)
O2 Saturation: 63.2 %
Total hemoglobin: 9.6 g/dL — ABNORMAL LOW (ref 12.0–16.0)

## 2014-02-11 LAB — CALCIUM, IONIZED: Calcium, Ion: 1.06 mmol/L — ABNORMAL LOW (ref 1.13–1.30)

## 2014-02-11 LAB — SODIUM, URINE, RANDOM: Sodium, Ur: 29 mEq/L

## 2014-02-11 LAB — PREALBUMIN: Prealbumin: 9.4 mg/dL — ABNORMAL LOW (ref 17.0–34.0)

## 2014-02-11 MED ORDER — PRISMASOL BGK 4/2.5 32-4-2.5 MEQ/L IV SOLN
INTRAVENOUS | Status: DC
  Administered 2014-02-11 – 2014-02-26 (×119): via INTRAVENOUS_CENTRAL
  Filled 2014-02-11 (×166): qty 5000

## 2014-02-11 MED ORDER — PRISMASOL BGK 4/2.5 32-4-2.5 MEQ/L IV SOLN
INTRAVENOUS | Status: DC
  Administered 2014-02-11 – 2014-02-25 (×15): via INTRAVENOUS_CENTRAL
  Filled 2014-02-11 (×19): qty 5000

## 2014-02-11 MED ORDER — POTASSIUM CHLORIDE 10 MEQ/50ML IV SOLN
10.0000 meq | Freq: Once | INTRAVENOUS | Status: AC
  Administered 2014-02-11: 10 meq via INTRAVENOUS
  Filled 2014-02-11: qty 50

## 2014-02-11 MED ORDER — VANCOMYCIN HCL IN DEXTROSE 1-5 GM/200ML-% IV SOLN
1000.0000 mg | INTRAVENOUS | Status: DC
  Administered 2014-02-11: 1000 mg via INTRAVENOUS
  Filled 2014-02-11 (×2): qty 200

## 2014-02-11 MED ORDER — PRISMASOL BGK 4/2.5 32-4-2.5 MEQ/L IV SOLN
INTRAVENOUS | Status: DC
  Administered 2014-02-11 – 2014-02-26 (×23): via INTRAVENOUS_CENTRAL
  Filled 2014-02-11 (×28): qty 5000

## 2014-02-11 MED ORDER — CETYLPYRIDINIUM CHLORIDE 0.05 % MT LIQD
7.0000 mL | OROMUCOSAL | Status: DC
  Administered 2014-02-11 – 2014-02-14 (×25): 7 mL via OROMUCOSAL
  Filled 2014-02-11 (×18): qty 7

## 2014-02-11 MED ORDER — POTASSIUM CHLORIDE 10 MEQ/50ML IV SOLN
10.0000 meq | INTRAVENOUS | Status: AC
  Administered 2014-02-11 (×2): 10 meq via INTRAVENOUS
  Filled 2014-02-11 (×2): qty 50

## 2014-02-11 MED ORDER — POTASSIUM CHLORIDE 10 MEQ/50ML IV SOLN: 10.0000 meq | Freq: Once | INTRAVENOUS | Status: DC

## 2014-02-11 MED ORDER — NEPRO/CARBSTEADY PO LIQD
1000.0000 mL | ORAL | Status: DC
Start: 2014-02-11 — End: 2014-02-17
  Administered 2014-02-11 – 2014-02-17 (×8): 1000 mL via ORAL
  Filled 2014-02-11 (×10): qty 1000

## 2014-02-11 MED ORDER — CETYLPYRIDINIUM CHLORIDE 0.05 % MT LIQD
7.0000 mL | OROMUCOSAL | Status: DC
Start: 2014-02-11 — End: 2014-02-11
  Administered 2014-02-11 (×4): 7 mL via OROMUCOSAL
  Filled 2014-02-11 (×12): qty 7

## 2014-02-11 MED ORDER — SODIUM CHLORIDE 0.9 % FOR CRRT
INTRAVENOUS_CENTRAL | Status: DC | PRN
  Administered 2014-02-11: 10:00:00 via INTRAVENOUS_CENTRAL
  Filled 2014-02-11: qty 1000

## 2014-02-11 MED ORDER — HEPARIN SODIUM (PORCINE) 1000 UNIT/ML DIALYSIS: 1000.0000 [IU] | INTRAMUSCULAR | Status: DC | PRN

## 2014-02-11 MED ORDER — POTASSIUM CHLORIDE 10 MEQ/50ML IV SOLN: 10.0000 meq | INTRAVENOUS | Status: DC

## 2014-02-11 MED ORDER — CHLORHEXIDINE GLUCONATE 0.12 % MT SOLN
15.0000 mL | Freq: Two times a day (BID) | OROMUCOSAL | Status: DC
Start: 2014-02-11 — End: 2014-03-06
  Administered 2014-02-11 – 2014-03-06 (×41): 15 mL via OROMUCOSAL
  Filled 2014-02-11 (×40): qty 15

## 2014-02-11 NOTE — Progress Notes (Signed)
PT Cancellation Note  Patient Details Name: [name] MRN: [id number] DOB: 05-14-1941   Cancelled Treatment:    Reason Eval/Treat Not Completed: Medical issues which prohibited therapy, spoke with nursing re: pt's current condition. Pt will remain on PT hold for now. Will check back tomorrow.   Jvon Meroney, [place] 02/11/2014, 1:18 PM

## 2014-02-11 NOTE — Progress Notes (Signed)
PULMONARY / CRITICAL CARE MEDICINE  Name: [name] MRN: [id number] DOB: 02-03-1941    ADMISSION DATE:  01/25/2014 CONSULTATION DATE:  02/11/2014  REFERRING MD :  Tyrone SageGerhardt  CHIEF COMPLAINT:  Hypoxemia  INITIAL PRESENTATION: 73 yo admitted 7/12 with STEMI. Taken to cath lab but unable to perform PCI due to significant RCA disease. Taken to OR emergently for CABG x 4.  Returned to SICU mechanically ventilated.   STUDIES / EVENTS:  7/12 Left heart cath: occlusion of RCA not amendable to PCI 7/13 CABG x 4 + MVR [name]([place]) 7/20 TTE:  EF 55%, no RWMA, grade 3 DD, PAP 41 torr 7/24 R thoracentesis 1,200 mL 7/25 Extubated 7/27 Left hemithorax opacification. reintubated 7/27 FOB: no airway obstruction noted 7/27 CT chest: large L effusion, moderate R effusion 7/27 CT guided pleural catheter placed by IR. 1300 cc brown, slightly red colored pleural fluid 7/28 Renal consult: CRRT initiated 7/29 on low dose pressors. Remains on CRRT. RASS 0. + F/C   INTERVAL HISTORY:   RASS -0. + F/C  VITAL SIGNS: Temp:  [96.6 F (35.9 C)-99.3 F (37.4 C)] 96.6 F (35.9 C) (07/29 1230) Pulse Rate:  [72-91] 73 (07/29 1230) Resp:  [20-29] 23 (07/29 1230) BP: (91-136)/(44-85) 112/47 mmHg (07/29 1230) SpO2:  [96 %-100 %] 100 % (07/29 1230) Arterial Line BP: (72-132)/(34-107) 123/62 mmHg (07/29 1230) FiO2 (%):  [40 %] 40 % (07/29 1200) Weight:  [105.6 kg (232 lb 12.9 oz)] 105.6 kg (232 lb 12.9 oz) (07/29 0500)  HEMODYNAMICS: CVP:  [11 mmHg-32 mmHg] 19 mmHg  VENTILATOR SETTINGS: Vent Mode:  [-] PRVC FiO2 (%):  [40 %] 40 % Set Rate:  [15 bmp] 15 bmp Vt Set:  [500 mL] 500 mL PEEP:  [5 cmH20] 5 cmH20 Plateau Pressure:  [20 cmH20-24 cmH20] 24 cmH20  INTAKE / OUTPUT: Intake/Output     07/28 0701 - 07/29 0700 07/29 0701 - 07/30 0700   I.V. (mL/kg) 974.7 (9.2)    NG/GT 890 60   IV Piggyback 250    Total Intake(mL/kg) 2114.7 (20) 60 (0.6)   Urine (mL/kg/hr) 325 (0.1) 110 (0.2)   Other  20 (0)    Stool 200 (0.1)    Chest Tube 180 (0.1) 40 (0.1)   Total Output 705 170   Net +1409.7 -110        Stool Occurrence 3 x     PHYSICAL EXAMINATION: General: RASS 0. + F/C Neuro: MAEs, diffusely weak HEENT: WNL Cardiovascular: reg, no M  Lungs: scattered rhonchi, no wheezes Abdomen: Soft, non tender, bowel sounds present Ext: Bilateral symmetric pitting edema  LABS: CBC  Recent Labs Lab 02/10/14 0455 02/10/14 1810 02/11/14 0426  WBC 16.7* 23.7* 19.7*  HGB 9.4* 9.1*  14.3 9.3*  HCT 29.0* 28.0*  42.0 28.3*  PLT 373 410* 396   Coag's  Recent Labs Lab 02/08/14 0730 02/08/14 1600 02/09/14 0330  APTT 115* 88* 113*   BMET  Recent Labs Lab 02/09/14 0330  02/10/14 0455 02/10/14 1810 02/11/14 0426  NA 134*  < > 133* 133* 135*  K 4.8  < > 3.6* 3.1* 3.1*  CL 86*  < > 86* 91* 87*  CO2 29  --  26  --  27  BUN 103*  < > 111* 108* 118*  CREATININE 2.47*  < > 3.05* 3.70* 3.53*  GLUCOSE 165*  < > 198* 257* 193*  < > = values in this interval not displayed. Electrolytes  Recent Labs Lab 02/05/14 0430  02/06/14 0435 02/07/14 0400  02/09/14 0330 02/10/14 0455 02/11/14 0426  CALCIUM 8.0* 8.5 8.7  < > 8.5 8.4 8.5  MG 1.6 1.7 2.1  --   --   --   --   PHOS 3.1 3.6 4.2  --   --   --   --   < > = values in this interval not displayed. Sepsis Markers No results found for this basename: LATICACIDVEN, PROCALCITON, O2SATVEN,  in the last 168 hours  ABG  Recent Labs Lab 02/07/14 0437 02/08/14 0500 02/09/14 0453  PHART 7.516* 7.499* 7.503*  PCO2ART 42.7 45.3* 38.5  PO2ART 87.5 84.0 114.0*   Liver Enzymes  Recent Labs Lab 02/08/14 0330 02/10/14 0455 02/11/14 0426  AST 40* 43* 30  ALT 27 28 24   ALKPHOS 165* 152* 128*  BILITOT 1.8* 1.4* 1.2  ALBUMIN 2.9* 2.7* 2.6*   Cardiac Enzymes No results found for this basename: TROPONINI, PROBNP,  in the last 168 hours  Glucose  Recent Labs Lab 02/10/14 1648 02/10/14 1924 02/10/14 2352 02/11/14 0351  02/11/14 0812 02/11/14 1131  GLUCAP 251* 249* 208* 199* 154* 180*   CXR: hazy opacification over the left perihilar region and mid to lower lung which may be due to asymmetric edema versus infection with basilar effusions/atelectasis. Improved aeration right base.   ASSESSMENT / PLAN:  Acute hypoxemic respiratory failure. Cont vent support - settings reviewed and/or adjusted Cont vent bundle Daily SBT if/when meets criteria Might require trach tube  Large L pleural effusion - s/p pleural cath drainage 7/27 Cont chest tube drainage  Pulm edema Cont diuresis as permitted by BP and renal function  AKI Renal consult 7/28 CRRT per Renal Service   I have personally obtained history, examined patient, evaluated and interpreted laboratory and imaging results, reviewed medical records, formulated assessment / plan and placed orders.  CRITICAL CARE:  The patient is critically ill with multiple organ systems failure and requires high complexity decision making for assessment and support, frequent evaluation and titration of therapies, application of advanced monitoring technologies and extensive interpretation of multiple databases. Critical Care Time devoted to patient care services described in this note is 30 minutes.   Billy Fischer, MD Pulmonary and Critical Care Medicine [facility] Pager: [phone]  02/11/2014, 1:20 PM

## 2014-02-11 NOTE — Progress Notes (Signed)
17 Days Post-Op  Subjective: Pt awake, following commands; on vent Objective: Vital signs in last 24 hours: Temp:  [97.6 F (36.4 C)-100.5 F (38.1 C)] 98.3 F (36.8 C) (07/29 0700) Pulse Rate:  [71-91] 78 (07/29 0745) Resp:  [20-34] 26 (07/29 0745) BP: (77-130)/(34-65) 116/49 mmHg (07/29 0745) SpO2:  [93 %-100 %] 99 % (07/29 0745) Arterial Line BP: (72-135)/(34-68) 99/55 mmHg (07/29 0700) FiO2 (%):  [40 %] 40 % (07/29 0745) Weight:  [232 lb 12.9 oz (105.6 kg)] 232 lb 12.9 oz (105.6 kg) (07/29 0500) Last BM Date: 02/09/14  Intake/Output from previous day: 07/28 0701 - 07/29 0700 In: 2114.7 [I.V.:974.7; NG/GT:890; IV Piggyback:250] Out: 705 [Urine:325; Stool:200; Chest Tube:180] Intake/Output this shift: Total I/O In: -  Out: 30 [Urine:30]  Left chest drain intact, output 180 cc's serous fluid; small fibrin strand in tubing; pleural fl cx's neg; CXR as below  Lab Results:   Recent Labs  02/10/14 1810 02/11/14 0426  WBC 23.7* 19.7*  HGB 9.1* 9.3*  HCT 28.0* 28.3*  PLT 410* 396   BMET  Recent Labs  02/10/14 0455 02/11/14 0426  NA 133* 135*  K 3.6* 3.1*  CL 86* 87*  CO2 26 27  GLUCOSE 198* 193*  BUN 111* 118*  CREATININE 3.05* 3.53*  CALCIUM 8.4 8.5   PT/INR No results found for this basename: LABPROT, INR,  in the last 72 hours ABG  Recent Labs  02/09/14 0453  PHART 7.503*  HCO3 30.3*    Studies/Results: Ct Chest Wo Contrast  02/09/2014   CLINICAL DATA:  History of CABG and mitral valve replacement complicated by subsequent respiratory failure, now with large symptomatic left-sided pleural effusion. Please evaluate left-sided pleural fluid prior to potential CT-guided left-sided percutaneous drainage catheter placement.  EXAM: CT CHEST WITHOUT CONTRAST  TECHNIQUE: Multidetector CT imaging of the chest was performed following the standard protocol without IV contrast.  COMPARISON:  None.  FINDINGS: Evaluation of the pulmonary parenchyma is degraded  secondary to patient respiratory artifact.  There is a small right and moderate-sized left-sided pleural effusions with associated atelectasis/collapse of the left lower lobe and lingula as well as partial atelectasis of the right lower lobe. Ill-defined calcifications within the collapsed left lower lobe may represent granulomas. No discrete pulmonary nodules given the limitations of this examination.  Scattered mediastinal lymph nodes individually not enlarged by size criteria with index precarinal lymph node measuring 0.8 cm in greatest short axis diameter. No definite mediastinal, hilar or axillary lymphadenopathy on this noncontrast examination.  Endotracheal to tip terminates superior to the carina. No pneumothorax. Right upper extremity approach PICC line tip terminates within the superior cavoatrial junction.  Post median sternotomy and CABG with extensive calcifications within the native coronary arteries. Epicardial pacing wires persist. There is a very minimal amount of pericardial thickening, presumably postoperative in nature. Post mitral valve replacement with extensive calcifications within the mitral valve annulus. Scattered atherosclerotic plaque within a normal caliber thoracic aorta. The left vertebral artery is incidentally noted to arise directly from the aortic arch.  Enteric tube tip terminates within the mid body of the stomach. There is a radiopaque structure seen within the left upper abdomen, incompletely imaged. There is a small amount of intra-abdominal ascites. There is an ill-defined approximately 2.2 x 1.6 cm hypo attenuating lesion within the mid body of the spleen (image 74, series 2), which is incompletely characterized on this noncontrast examination though possibly representative of a splenic cyst and/or hemangioma. Exuberant calcifications within the splenic artery.  Punctate calcifications within the dome of the right lobe of the liver, the sequela of prior granulomatous  infection.  No acute or aggressive osseous abnormalities. The thyroid gland appears heterogeneous and possibly minimally enlarged post incompletely imaged on the present examination.  IMPRESSION: 1. Small right and moderate left-sided pleural effusions with associated bilateral atelectasis/collapse, left greater than right. 2. Post median sternotomy, CABG and mitral valve replacement without evidence of complication on this noncontrast examination. 3. Appropriately positioned support apparatus.  No pneumothorax. 4. Small amount of ascites seen within the imaged upper abdomen.   Electronically Signed   By: Simonne Come M.D.   On: 02/09/2014 16:26   Dg Chest Port 1 View  02/11/2014   CLINICAL DATA:  CABG.  EXAM: PORTABLE CHEST - 1 VIEW  COMPARISON:  02/10/2014 and 02/09/2014  FINDINGS: Patient is rotated to the right. Endotracheal tube has tip approximately 5 cm above the carina. Left IJ central venous catheter has tip over the SVC. Right-sided PICC line is unchanged. Enteric tube courses into the region of the stomach and off the inferior portion of the film. Left-sided chest tube unchanged.  Lungs are adequately inflated with mild improved aeration over the right base. Mild worsening hazy opacification in the left mid to lower lung and perihilar region. No definite pneumothorax. Stable cardiomegaly. Remainder of the exam is unchanged.  IMPRESSION: Worsening hazy opacification over the left perihilar region and mid to lower lung which may be due to asymmetric edema versus infection with basilar effusions/atelectasis. Improved aeration right base.  Stable cardiomegaly.  Tubes and lines as described.   Electronically Signed   By: Elberta Fortis M.D.   On: 02/11/2014 07:49   Dg Chest Port 1 View  02/10/2014   CLINICAL DATA:  Dialysis catheter placement.  EXAM: PORTABLE CHEST - 1 VIEW  COMPARISON:  Chest x-ray 02/07/2014.  FINDINGS: Endotracheal tube, NG tube, right PICC line, left IJ catheter in good anatomic  position. Left chest tube noted and good anatomic position. No pneumothorax. Cardiomegaly. Prior median sternotomy and CABG. Prior valve replacement. Pulmonary venous congestion with bilateral pulmonary alveolar infiltrates and pleural effusions consistent with congestive heart failure. Carotid atherosclerotic vascular calcification. No acute bony abnormality.  IMPRESSION: 1. Interim placement of left IJ dialysis catheter, its tip is in the region of the superior vena cava. 2. Left chest tube noted. No pneumothorax. Remainder of the lines and tubes are in stable position. 3. Congestive heart failure with pulmonary edema and bilateral pleural effusions. No significant improvement from prior study. 4. Carotid vascular disease.   Electronically Signed   By: Maisie Fus  Register   On: 02/10/2014 15:47   Dg Chest Port 1 View  02/10/2014   CLINICAL DATA:  Status post chest tube placement  EXAM: PORTABLE CHEST - 1 VIEW  COMPARISON:  02/09/2014  FINDINGS: An endotracheal tube is noted 3.7 cm above the carina. Feeding catheter extends into the stomach. The left-sided chest tube is now seen near complete reduction of the left pleural effusion. No pneumothorax is noted. Left basilar atelectasis is noted. A small right-sided pleural effusion seen. A PICC line is noted in the mid superior vena cava. Postsurgical changes are again noted.  IMPRESSION: Significant reduction in left-sided pleural effusion. Persistent left basilar atelectasis is noted. Small right-sided pleural effusion remains.  Tubes and lines as described.   Electronically Signed   By: Alcide Clever M.D.   On: 02/10/2014 07:41   Dg Chest Port 1 View  02/09/2014   CLINICAL DATA:  Hypoxia  EXAM: PORTABLE CHEST - 1 VIEW  COMPARISON:  Study obtained earlier in the day  FINDINGS: Endotracheal tube tip is 3.5 cm above the carina. Central catheter tip is in the superior vena cava. No pneumothorax.  There is a sizable effusion on the left which appears slightly smaller  compared to earlier in the day. There is atelectatic change throughout much of the left lung is well. There may be superimposed airspace consolidation on the left. There is a much smaller effusion on the right, stable. Right lung is otherwise clear.  Heart is mildly enlarged. 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 No adenopathy is apparent. The pulmonary vascularity is within normal limits.  IMPRESSION: Tube and catheter positions as described without pneumothorax. Bilateral effusions, much larger on the left than on the right. Effusion on the left is slightly smaller compared to earlier in the day. There is extensive atelectatic change on the left. There may be some airspace consolidation superimposed with effusion on the left as well. No change in cardiac silhouette.   Electronically Signed   By: Bretta Bang M.D.   On: 02/09/2014 10:12   Dg Abd Portable 1v  02/10/2014   CLINICAL DATA:  Feeding tube placement  EXAM: PORTABLE ABDOMEN - 1 VIEW  COMPARISON:  Abdominal film of February 09, 2014  FINDINGS: The radiodense tip of the feeding tube lies now in the proximal jejunum. No abnormal bowel loops are demonstrated.  IMPRESSION: The feeding tube tip lies in the region of the proximal jejunum.   Electronically Signed   By: David  [place]   On: 02/10/2014 07:47   Dg Abd Portable 1v  02/09/2014   CLINICAL DATA:  Feeding tube placement  EXAM: PORTABLE ABDOMEN - 1 VIEW  COMPARISON:  February 01, 2014  FINDINGS: Feeding tube tip is below the diaphragm. It is difficult to ascertain whether the tip is in the mid to distal stomach versus third duodenum. Bowel gas pattern is unremarkable. Temporary pacemaker wires extend to the right heart. There is left pleural effusion.  IMPRESSION: It is difficult to ascertain whether the feeding tube tip is in the mid to distal stomach versus mid duodenum. If this differentiation will affect patient management, a small contrast injection  through the tube with image confirmation could distinguish whether tube tip is in stomach versus duodenum. Bowel gas pattern unremarkable.   Electronically Signed   By: Bretta Bang M.D.   On: 02/09/2014 12:51   Ct Perc Pleural Drain W/indwell Cath W/img Guide  02/09/2014   INDICATION: Post median sternotomy, CABG and mitral valve replacement, now with persistent symptomatic left-sided pleural effusion. Please perform CT-guided drainage left chest tube placement.  EXAM: CT PERC PLEURAL DRAIN W/INDWELL CATH W/IMG GUIDE  COMPARISON:  Chest CT- earlier same day  MEDICATIONS: The patient is currently admitted to the hospital and receiving intravenous antibiotics. The antibiotics were administered within an appropriate time frame prior to the initiation of the procedure.  ANESTHESIA/SEDATION: None  CONTRAST:  None  COMPLICATIONS: None immediate  PROCEDURE: Informed written consent was obtained from the patient's family after a discussion of the risks, benefits and alternatives to treatment. The patient was placed supine on the CT gantry and a pre procedural CT was performed re-demonstrating the known moderate to large sized left-sided pleural effusion. The procedure was planned. A timeout was performed prior to the initiation of the procedure.  The skin overlying the anterior lateral aspect of the left inferior chest was  prepped and draped in the usual sterile fashion. The overlying soft tissues were anesthetized with 1% lidocaine with epinephrine. Appropriate trajectory was planned with the use of a 22 gauge spinal needle. An 18 gauge trocar needle was advanced into the left pleural space and a short Amplatz super stiff wire was coiled within the collection. Appropriate positioning was confirmed with a limited CT scan. The tract was serially dilated allowing placement of a 12 [place] all-purpose drainage catheter. Appropriate positioning was confirmed with a limited postprocedural CT scan.  Approximately 1.3 L Ml  of brown, slightly red colored pleural fluid was aspirated. The tube was connected to a drainage bag and sutured in place. A dressing was placed. The patient tolerated the procedure well without immediate post procedural complication.  IMPRESSION: Successful CT guided placement of a 1812 French all purpose drain catheter into the left pleural space with aspiration of approximately 1.3 L of pleural fluid. Samples were sent to the laboratory as requested by the ordering clinical team.   Electronically Signed   By: Simonne ComeJohn  Watts M.D.   On: 02/09/2014 16:30    Anti-infectives: Anti-infectives   Start     Dose/Rate Route Frequency Ordered Stop   02/10/14 1000  cefTAZidime (FORTAZ) 1 g in dextrose 5 % 50 mL IVPB  Status:  Discontinued     1 g 100 mL/hr over 30 Minutes Intravenous Every 24 hours 02/09/14 1119 02/10/14 0810   02/10/14 1000  piperacillin-tazobactam (ZOSYN) IVPB 3.375 g  Status:  Discontinued     3.375 g 12.5 mL/hr over 240 Minutes Intravenous Every 12 hours 02/10/14 0809 02/10/14 0830   02/10/14 0930  fluconazole (DIFLUCAN) IVPB 100 mg  Status:  Discontinued     100 mg 50 mL/hr over 60 Minutes Intravenous Every 24 hours 02/10/14 0809 02/11/14 0841   02/10/14 0930  piperacillin-tazobactam (ZOSYN) IVPB 2.25 g     2.25 g 100 mL/hr over 30 Minutes Intravenous Every 6 hours 02/10/14 0832     02/09/14 2030  vancomycin (VANCOCIN) 1,500 mg in sodium chloride 0.9 % 500 mL IVPB     1,500 mg 250 mL/hr over 120 Minutes Intravenous Every 48 hours 02/09/14 1914     02/07/14 0100  cefTAZidime (FORTAZ) 1 g in dextrose 5 % 50 mL IVPB  Status:  Discontinued     1 g 100 mL/hr over 30 Minutes Intravenous Every 12 hours 02/06/14 1502 02/09/14 1119   01/28/14 1400  cefTAZidime (FORTAZ) 1 g in dextrose 5 % 50 mL IVPB  Status:  Discontinued     1 g 100 mL/hr over 30 Minutes Intravenous 3 times per day 01/28/14 0950 02/06/14 1502   01/27/14 0045  vancomycin (VANCOCIN) IVPB 750 mg/150 ml premix     750 mg 150  mL/hr over 60 Minutes Intravenous Every 12 hours 01/26/14 1242 01/27/14 1356   01/26/14 1215  vancomycin (VANCOCIN) IVPB 1000 mg/200 mL premix     1,000 mg 200 mL/hr over 60 Minutes Intravenous  Once 01/26/14 1015 01/26/14 1442   01/26/14 1030  cefUROXime (ZINACEF) 1.5 g in dextrose 5 % 50 mL IVPB     1.5 g 100 mL/hr over 30 Minutes Intravenous Every 12 hours 01/26/14 1015 01/27/14 2323   01/25/14 2100  vancomycin (VANCOCIN) 1,250 mg in sodium chloride 0.9 % 250 mL IVPB  Status:  Discontinued     1,250 mg 166.7 mL/hr over 90 Minutes Intravenous To Surgery 01/25/14 2057 01/25/14 2142   01/25/14 2100  cefUROXime (ZINACEF) 1.5 g in  dextrose 5 % 50 mL IVPB  Status:  Discontinued     1.5 g 100 mL/hr over 30 Minutes Intravenous To Surgery 01/25/14 2057 01/25/14 2142   01/25/14 2100  cefUROXime (ZINACEF) 750 mg in dextrose 5 % 50 mL IVPB  Status:  Discontinued     750 mg 100 mL/hr over 30 Minutes Intravenous To Surgery 01/25/14 2057 01/25/14 2142      Assessment/Plan: s/p Procedure(s): CORONARY ARTERY BYPASS GRAFTING TIMES FOUR USING LEFT INTERNAL MAMMARY ARTERY TO LAD, SAPHENOUS VEIN GRAFTS TO OM1, OM2, AND PDA (N/A) MITRAL VALVE (MV) REPLACEMENT (N/A) S/p left chest drain 7/27 for persistent effusion; for CVVH today; other plans as per Dr. Donata Clay  LOS: 17 days    Shephanie Romas,D Caryn Bee 02/11/2014

## 2014-02-11 NOTE — Progress Notes (Signed)
T[facility] BRIEF SICU PROGRESS NOTE  17 Days Post-Op  S/P Procedure(s) (LRB): CORONARY ARTERY BYPASS GRAFTING TIMES FOUR USING LEFT INTERNAL MAMMARY ARTERY TO LAD, SAPHENOUS VEIN GRAFTS TO OM1, OM2, AND PDA (N/A) MITRAL VALVE (MV) REPLACEMENT (N/A)   More alert this afternoon.  Following some simple commands Aflutter w/ controlled rate 70-80 BP stable on levophed @ 12 mcg/min and milrinone @ 0.25 Remains oliguric, tolerating CVVHD I/O's essentially balanced so far today  Plan: Continue current plan  [name] H 02/11/2014 7:08 PM

## 2014-02-11 NOTE — Progress Notes (Signed)
[facility] DAILY ICU PROGRESS NOTE                   [address]            [place] [address]          [phone]   17 Days Post-Op Procedure(s) (LRB): CORONARY ARTERY BYPASS GRAFTING TIMES FOUR USING LEFT INTERNAL MAMMARY ARTERY TO LAD, SAPHENOUS VEIN GRAFTS TO OM1, OM2, AND PDA (N/A) MITRAL VALVE (MV) REPLACEMENT (N/A)  Total Length of Stay:  LOS: 17 days   Subjective: Currently, awake and alert on vent, following commands.  Having episodes of wide complex ventricular ectopy overnight.  Pacer and Amiodarone d/c'ed.    Objective: Vital signs in last 24 hours: Temp:  [97.6 F (36.4 C)-100.5 F (38.1 C)] 98.3 F (36.8 C) (07/29 0700) Pulse Rate:  [71-91] 73 (07/29 0700) Cardiac Rhythm:  [-] Normal sinus rhythm (07/29 0400) Resp:  [20-34] 25 (07/29 0700) BP: (77-130)/(34-65) 104/47 mmHg (07/29 0700) SpO2:  [93 %-100 %] 100 % (07/29 0742) Arterial Line BP: (72-135)/(34-68) 99/55 mmHg (07/29 0700) FiO2 (%):  [40 %] 40 % (07/29 0742) Weight:  [232 lb 12.9 oz (105.6 kg)] 232 lb 12.9 oz (105.6 kg) (07/29 0500)  Filed Weights   02/09/14 0600 02/10/14 0500 02/11/14 0500  Weight: 231 lb 11.3 oz (105.1 kg) 224 lb 13.9 oz (102 kg) 232 lb 12.9 oz (105.6 kg)    Weight change: 7 lb 15 oz (3.6 kg)   Hemodynamic parameters for last 24 hours: CVP:  [11 mmHg-32 mmHg] 14 mmHg  Intake/Output from previous day: 07/28 0701 - 07/29 0700 In: 2114.7 [I.V.:974.7; NG/GT:890; IV Piggyback:250] Out: 705 [Urine:325; Stool:200; Chest Tube:180]  CBGs  [phone]    Current Meds: Scheduled Meds: . antiseptic oral rinse  15 mL Mouth Rinse Q2H  . aspirin  324 mg Per Tube Daily  . bisacodyl  10 mg Oral Daily   Or  . bisacodyl  10 mg Rectal Daily  . chlorhexidine  15 mL Mouth Rinse BID  . feeding supplement (NEPRO CARB STEADY)  1,000 mL Oral Q24H  . feeding supplement (PRO-STAT SUGAR FREE 64)  30 mL Per Tube 5 X Daily  . fluconazole (DIFLUCAN) IV  100 mg Intravenous Q24H    . insulin aspart  0-24 Units Subcutaneous 6 times per day  . insulin glargine  25 Units Subcutaneous BID  . levalbuterol  1.25 mg Nebulization 6 times per day  . levothyroxine  37.5 mcg Intravenous QAC breakfast  . pantoprazole (PROTONIX) IV  40 mg Intravenous Q24H  . piperacillin-tazobactam (ZOSYN)  IV  2.25 g Intravenous Q6H  . sodium chloride  10-40 mL Intracatheter Q12H  . vancomycin  1,500 mg Intravenous Q48H   Continuous Infusions: . sodium chloride Stopped (02/01/14 0830)  . sodium chloride 250 mL (02/11/14 0600)  . milrinone 0.25 mcg/kg/min (02/11/14 0600)  . norepinephrine (LEVOPHED) Adult infusion 11 mcg/min (02/11/14 0600)   PRN Meds:.heparin, ondansetron (ZOFRAN) IV, sodium chloride   Physical Exam: General appearance: alert and following commands, remains on vent Heart: RRR, freq runs of wide complex rhythm Lungs: Mostly clear, somewhat diminished in bases Abdomen: soft, non-tender; bowel sounds normal; no masses,  no organomegaly Extremities: +LE edema Wound: Clean and dry, no erythema or drainage  Lab Results: CBC: Recent Labs  02/10/14 1810 02/11/14 0426  WBC 23.7* 19.7*  HGB 9.1* 9.3*  HCT 28.0* 28.3*  PLT 410* 396   BMET:  Recent Labs  02/10/14 0455  02/11/14 0426  NA 133* 135*  K 3.6* 3.1*  CL 86* 87*  CO2 26 27  GLUCOSE 198* 193*  BUN 111* 118*  CREATININE 3.05* 3.53*  CALCIUM 8.4 8.5    PT/INR: No results found for this basename: LABPROT, INR,  in the last 72 hours Radiology:  Dg Chest Port 1 View  02/11/2014 FINDINGS:  Patient is rotated to the right. Endotracheal tube has tip  approximately 5 cm above the carina. Left IJ central venous catheter  has tip over the SVC. Right-sided PICC line is unchanged. Enteric  tube courses into the region of the stomach and off the inferior  portion of the film. Left-sided chest tube unchanged.  Lungs are adequately inflated with mild improved aeration over the  right base. Mild worsening hazy  opacification in the left mid to  lower lung and perihilar region. No definite pneumothorax. Stable  cardiomegaly. Remainder of the exam is unchanged.  IMPRESSION:  Worsening hazy opacification over the left perihilar region and mid  to lower lung which may be due to asymmetric edema versus infection  with basilar effusions/atelectasis. Improved aeration right base.  Stable cardiomegaly.  Tubes and lines as described.   Dg Chest Port 1 View  02/10/2014   CLINICAL DATA:  Dialysis catheter placement.  EXAM: PORTABLE CHEST - 1 VIEW  COMPARISON:  Chest x-ray 02/07/2014.  FINDINGS: Endotracheal tube, NG tube, right PICC line, left IJ catheter in good anatomic position. Left chest tube noted and good anatomic position. No pneumothorax. Cardiomegaly. Prior median sternotomy and CABG. Prior valve replacement. Pulmonary venous congestion with bilateral pulmonary alveolar infiltrates and pleural effusions consistent with congestive heart failure. Carotid atherosclerotic vascular calcification. No acute bony abnormality.  IMPRESSION: 1. Interim placement of left IJ dialysis catheter, its tip is in the region of the superior vena cava. 2. Left chest tube noted. No pneumothorax. Remainder of the lines and tubes are in stable position. 3. Congestive heart failure with pulmonary edema and bilateral pleural effusions. No significant improvement from prior study. 4. Carotid vascular disease.   Electronically Signed   By: Maisie Fus  Register   On: 02/10/2014 15:47   Dg Chest Port 1 View  02/10/2014   CLINICAL DATA:  Status post chest tube placement  EXAM: PORTABLE CHEST - 1 VIEW  COMPARISON:  02/09/2014  FINDINGS: An endotracheal tube is noted 3.7 cm above the carina. Feeding catheter extends into the stomach. The left-sided chest tube is now seen near complete reduction of the left pleural effusion. No pneumothorax is noted. Left basilar atelectasis is noted. A small right-sided pleural effusion seen. A PICC line is noted  in the mid superior vena cava. Postsurgical changes are again noted.  IMPRESSION: Significant reduction in left-sided pleural effusion. Persistent left basilar atelectasis is noted. Small right-sided pleural effusion remains.  Tubes and lines as described.   Electronically Signed   By: Alcide Clever M.D.   On: 02/10/2014 07:41   Dg Chest Port 1 View  02/09/2014   CLINICAL DATA:  Hypoxia  EXAM: PORTABLE CHEST - 1 VIEW  COMPARISON:  Study obtained earlier in the day  FINDINGS: Endotracheal tube tip is 3.5 cm above the carina. Central catheter tip is in the superior vena cava. No pneumothorax.  There is a sizable effusion on the left which appears slightly smaller compared to earlier in the day. There is atelectatic change throughout much of the left lung is well. There may be superimposed airspace consolidation on the left. There is a much  smaller effusion on the right, stable. Right lung is otherwise clear.  Heart is mildly enlarged. 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 No adenopathy is apparent. The pulmonary vascularity is within normal limits.  IMPRESSION: Tube and catheter positions as described without pneumothorax. Bilateral effusions, much larger on the left than on the right. Effusion on the left is slightly smaller compared to earlier in the day. There is extensive atelectatic change on the left. There may be some airspace consolidation superimposed with effusion on the left as well. No change in cardiac silhouette.   Electronically Signed   By: Bretta BangWilliam  Woodruff M.D.   On: 02/09/2014 10:12   Dg Abd Portable 1v  02/10/2014   CLINICAL DATA:  Feeding tube placement  EXAM: PORTABLE ABDOMEN - 1 VIEW  COMPARISON:  Abdominal film of February 09, 2014  FINDINGS: The radiodense tip of the feeding tube lies now in the proximal jejunum. No abnormal bowel loops are demonstrated.  IMPRESSION: The feeding tube tip lies in the region of the proximal jejunum.    Electronically Signed   By: David  [place]az[place]   On: 02/10/2014 07:47   Dg Abd Portable 1v  02/09/2014   CLINICAL DATA:  Feeding tube placement  EXAM: PORTABLE ABDOMEN - 1 VIEW  COMPARISON:  February 01, 2014  FINDINGS: Feeding tube tip is below the diaphragm. It is difficult to ascertain whether the tip is in the mid to distal stomach versus third duodenum. Bowel gas pattern is unremarkable. Temporary pacemaker wires extend to the right heart. There is left pleural effusion.  IMPRESSION: It is difficult to ascertain whether the feeding tube tip is in the mid to distal stomach versus mid duodenum. If this differentiation will affect patient management, a small contrast injection through the tube with image confirmation could distinguish whether tube tip is in stomach versus duodenum. Bowel gas pattern unremarkable.   Electronically Signed   By: Bretta BangWilliam  Woodruff M.D.   On: 02/09/2014 12:51   Ct Perc Pleural Drain W/indwell Cath W/img Guide  02/09/2014   INDICATION: Post median sternotomy, CABG and mitral valve replacement, now with persistent symptomatic left-sided pleural effusion. Please perform CT-guided drainage left chest tube placement.  EXAM: CT PERC PLEURAL DRAIN W/INDWELL CATH W/IMG GUIDE  COMPARISON:  Chest CT- earlier same day  MEDICATIONS: The patient is currently admitted to the hospital and receiving intravenous antibiotics. The antibiotics were administered within an appropriate time frame prior to the initiation of the procedure.  ANESTHESIA/SEDATION: None  CONTRAST:  None  COMPLICATIONS: None immediate  PROCEDURE: Informed written consent was obtained from the patient's family after a discussion of the risks, benefits and alternatives to treatment. The patient was placed supine on the CT gantry and a pre procedural CT was performed re-demonstrating the known moderate to large sized left-sided pleural effusion. The procedure was planned. A timeout was performed prior to the initiation of the procedure.   The skin overlying the anterior lateral aspect of the left inferior chest was prepped and draped in the usual sterile fashion. The overlying soft tissues were anesthetized with 1% lidocaine with epinephrine. Appropriate trajectory was planned with the use of a 22 gauge spinal needle. An 18 gauge trocar needle was advanced into the left pleural space and a short Amplatz super stiff wire was coiled within the collection. Appropriate positioning was confirmed with a limited CT scan. The tract was serially dilated allowing placement of a 12 [place] all-purpose drainage catheter. Appropriate positioning was confirmed  with a limited postprocedural CT scan.  Approximately 1.3 L Ml of brown, slightly red colored pleural fluid was aspirated. The tube was connected to a drainage bag and sutured in place. A dressing was placed. The patient tolerated the procedure well without immediate post procedural complication.  IMPRESSION: Successful CT guided placement of a 12 French all purpose drain catheter into the left pleural space with aspiration of approximately 1.3 L of pleural fluid. Samples were sent to the laboratory as requested by the ordering clinical team.   Electronically Signed   By: Simonne Come M.D.   On: 02/09/2014 16:30     Assessment/Plan: S/P Procedure(s) (LRB): CORONARY ARTERY BYPASS GRAFTING TIMES FOUR USING LEFT INTERNAL MAMMARY ARTERY TO LAD, SAPHENOUS VEIN GRAFTS TO OM1, OM2, AND PDA (N/A) MITRAL VALVE (MV) REPLACEMENT (N/A)  VDRF- Continue vent management per CCM.   May ultimately need trach.   CV- Wide complex rhythm overnight, now off pacer and Amiodarone d/c'ed.  Back on Milrinone, Levophed. Co-ox 63.2. K+ 3.1.    Nutrition- post-pyloric TFs.   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 Discussed with Dr. Hyman Hopes and they plan CVVH today.  Leukocytosis, no further fever. On Vanc/Zosyn/Diflucan D#2 for presumed pneumonia. Respiratory cx's show normal oropharyngeal flora, pleural fluid cultures no  growth.  WBC down some today. Will continue to follow.  Postop anemia- H/H generally stable.  DM- sugars elevated, may need to resume Glucommander now that she is back on the vent.  Hypokalemia- supplement K+.    [name] H 02/11/2014 7:44 AM

## 2014-02-11 NOTE — Progress Notes (Addendum)
Pt having ventricular ectopy in between paced beats.  HR 90s-120s.  BP 90s.  Dr. Donata Clay notified.  Advised to turn pacer off.  EKG obtained with pacer off showing SR 70s with occ. PVCs, cannot rule out anterior infarct, inferior injury pattern, Acute MI.  Dr Donata Clay aware of EKG results.  Pacer turned to backup VVI at 60 and amiodarone turned off per Dr. Zenaida Niece Trigt's orders.  Will continue to monitor.

## 2014-02-11 NOTE — Progress Notes (Signed)
ANTIBIOTIC CONSULT NOTE - FOLLOW UP  Pharmacy Consult for vancomycin, zoosn Indication: PNA  No Known Allergies  Patient Measurements: Height: 5\' 7"  (170.2 cm) Weight: 232 lb 12.9 oz (105.6 kg) IBW/kg (Calculated) : 61.6  Vital Signs: Temp: 98.1 F (36.7 C) (07/29 0800) Temp src: Core (Comment) (07/29 0800) BP: 113/52 mmHg (07/29 0800) Pulse Rate: 74 (07/29 0800) Intake/Output from previous day: 07/28 0701 - 07/29 0700 In: 2114.7 [I.V.:974.7; NG/GT:890; IV Piggyback:250] Out: 705 [Urine:325; Stool:200; Chest Tube:180] Intake/Output from this shift: Total I/O In: -  Out: 95 [Urine:55; Chest Tube:40]  Labs:  Recent Labs  02/09/14 1743 02/10/14 0455 02/10/14 1810 02/11/14 0426  WBC  --  16.7* 23.7* 19.7*  HGB 11.6* 9.4* 9.1* 9.3*  PLT  --  373 410* 396  CREATININE 3.40* 3.05*  --  3.53*   Estimated Creatinine Clearance: 17.5 ml/min (by C-G formula based on Cr of 3.53).  Recent Labs  02/11/14 0426  VANCORANDOM 16.3     Microbiology: Recent Results (from the past 720 hour(s))  MRSA PCR SCREENING     Status: None   Collection Time    01/26/14 11:10 AM      Result Value Ref Range Status   MRSA by PCR NEGATIVE  NEGATIVE Final   Comment:            The GeneXpert MRSA Assay (FDA     approved for NASAL specimens     only), is one component of a     comprehensive MRSA colonization     surveillance program. It is not     intended to diagnose MRSA     infection nor to guide or     monitor treatment for     MRSA infections.  CULTURE, RESPIRATORY (NON-EXPECTORATED)     Status: None   Collection Time    01/27/14  9:53 AM      Result Value Ref Range Status   Specimen Description TRACHEAL ASPIRATE   Final   Special Requests Normal   Final   Gram Stain     Final   Value: FEW WBC PRESENT,BOTH PMN AND MONONUCLEAR     NO SQUAMOUS EPITHELIAL CELLS SEEN     NO ORGANISMS SEEN     Performed at [facility]   Culture     Final   Value: NO GROWTH 2 DAYS   Performed at [facility]   Report Status 01/29/2014 FINAL   Final  URINE CULTURE     Status: None   Collection Time    02/02/14 12:19 PM      Result Value Ref Range Status   Specimen Description URINE, CATHETERIZED   Final   Special Requests Normal   Final   Culture  Setup Time     Final   Value: 02/02/2014 19:42     Performed at [facility]   Colony Count     Final   Value: NO GROWTH     Performed at [facility]   Culture     Final   Value: NO GROWTH     Performed at [facility]   Report Status 02/03/2014 FINAL   Final  CLOSTRIDIUM DIFFICILE BY PCR     Status: None   Collection Time    02/05/14  5:18 PM      Result Value Ref Range Status   C difficile by pcr NEGATIVE  NEGATIVE Final  AFB CULTURE WITH SMEAR     Status: None  Collection Time    02/06/14  2:45 PM      Result Value Ref Range Status   Specimen Description FLUID PLEURAL   Final   Special Requests NONE   Final   Acid Fast Smear     Final   Value: NO ACID FAST BACILLI SEEN     Performed at [facility]   Culture     Final   Value: CULTURE WILL BE EXAMINED FOR 6 WEEKS BEFORE ISSUING A FINAL REPORT     Performed at [facility]   Report Status PENDING   Incomplete  BODY FLUID CULTURE     Status: None   Collection Time    02/06/14  2:45 PM      Result Value Ref Range Status   Specimen Description PLEURAL FLUID   Final   Special Requests NONE   Final   Gram Stain     Final   Value: NO WBC SEEN     NO ORGANISMS SEEN     Performed at [facility]   Culture     Final   Value: NO GROWTH 3 DAYS     Performed at [facility]   Report Status 02/10/2014 FINAL   Final  FUNGUS CULTURE W SMEAR     Status: None   Collection Time    02/06/14  2:46 PM      Result Value Ref Range Status   Specimen Description FLUID PLEURAL   Final   Special Requests NONE   Final   Fungal Smear     Final   Value: NO YEAST OR FUNGAL ELEMENTS SEEN     Performed at  [facility]   Culture     Final   Value: CULTURE IN PROGRESS FOR FOUR WEEKS     Performed at [facility]   Report Status PENDING   Incomplete  CULTURE, RESPIRATORY (NON-EXPECTORATED)     Status: None   Collection Time    02/09/14  8:27 AM      Result Value Ref Range Status   Specimen Description TRACHEAL ASPIRATE   Final   Special Requests NONE   Final   Gram Stain     Final   Value: FEW WBC PRESENT,BOTH PMN AND MONONUCLEAR     RARE SQUAMOUS EPITHELIAL CELLS PRESENT     MODERATE GRAM NEGATIVE RODS     FEW GRAM POSITIVE RODS     RARE GRAM POSITIVE COCCI IN PAIRS   Culture     Final   Value: Non-Pathogenic Oropharyngeal-type Flora Isolated.     Performed at [facility]   Report Status 02/11/2014 FINAL   Final  CULTURE, ROUTINE-ABSCESS     Status: None   Collection Time    02/09/14  4:08 PM      Result Value Ref Range Status   Specimen Description ABSCESS JP DRAINAGE   Final   Special Requests NONE   Final   Gram Stain     Final   Value: FEW WBC PRESENT,BOTH PMN AND MONONUCLEAR     NO SQUAMOUS EPITHELIAL CELLS SEEN     NO ORGANISMS SEEN     Performed at [facility]   Culture     Final   Value: MULTIPLE ORGANISMS PRESENT, NONE PREDOMINANT     Performed at [facility]   Report Status PENDING   Incomplete    Anti-infectives   Start     Dose/Rate Route Frequency Ordered Stop  02/10/14 1000  cefTAZidime (FORTAZ) 1 g in dextrose 5 % 50 mL IVPB  Status:  Discontinued     1 g 100 mL/hr over 30 Minutes Intravenous Every 24 hours 02/09/14 1119 02/10/14 0810   02/10/14 1000  piperacillin-tazobactam (ZOSYN) IVPB 3.375 g  Status:  Discontinued     3.375 g 12.5 mL/hr over 240 Minutes Intravenous Every 12 hours 02/10/14 0809 02/10/14 0830   02/10/14 0930  fluconazole (DIFLUCAN) IVPB 100 mg  Status:  Discontinued     100 mg 50 mL/hr over 60 Minutes Intravenous Every 24 hours 02/10/14 0809 02/11/14 0841   02/10/14 0930   piperacillin-tazobactam (ZOSYN) IVPB 2.25 g     2.25 g 100 mL/hr over 30 Minutes Intravenous Every 6 hours 02/10/14 0832     02/09/14 2030  vancomycin (VANCOCIN) 1,500 mg in sodium chloride 0.9 % 500 mL IVPB     1,500 mg 250 mL/hr over 120 Minutes Intravenous Every 48 hours 02/09/14 1914     02/07/14 0100  cefTAZidime (FORTAZ) 1 g in dextrose 5 % 50 mL IVPB  Status:  Discontinued     1 g 100 mL/hr over 30 Minutes Intravenous Every 12 hours 02/06/14 1502 02/09/14 1119   01/28/14 1400  cefTAZidime (FORTAZ) 1 g in dextrose 5 % 50 mL IVPB  Status:  Discontinued     1 g 100 mL/hr over 30 Minutes Intravenous 3 times per day 01/28/14 0950 02/06/14 1502   01/27/14 0045  vancomycin (VANCOCIN) IVPB 750 mg/150 ml premix     750 mg 150 mL/hr over 60 Minutes Intravenous Every 12 hours 01/26/14 1242 01/27/14 1356   01/26/14 1215  vancomycin (VANCOCIN) IVPB 1000 mg/200 mL premix     1,000 mg 200 mL/hr over 60 Minutes Intravenous  Once 01/26/14 1015 01/26/14 1442   01/26/14 1030  cefUROXime (ZINACEF) 1.5 g in dextrose 5 % 50 mL IVPB     1.5 g 100 mL/hr over 30 Minutes Intravenous Every 12 hours 01/26/14 1015 01/27/14 2323   01/25/14 2100  vancomycin (VANCOCIN) 1,250 mg in sodium chloride 0.9 % 250 mL IVPB  Status:  Discontinued     1,250 mg 166.7 mL/hr over 90 Minutes Intravenous To Surgery 01/25/14 2057 01/25/14 2142   01/25/14 2100  cefUROXime (ZINACEF) 1.5 g in dextrose 5 % 50 mL IVPB  Status:  Discontinued     1.5 g 100 mL/hr over 30 Minutes Intravenous To Surgery 01/25/14 2057 01/25/14 2142   01/25/14 2100  cefUROXime (ZINACEF) 750 mg in dextrose 5 % 50 mL IVPB  Status:  Discontinued     750 mg 100 mL/hr over 30 Minutes Intravenous To Surgery 01/25/14 2057 01/25/14 2142      Assessment: 73 yo female s/p CABG and MVR on vancomycin for possible PNA and noted with AKI and to start CRRT today. WBC= 19.7, tmax= 100.5, SCr= 3.53 (trend up), CrCl ~ 20.  Elita Quick 7/15>>7/27 Vanc 7/27>> Pip/Tazo  7/28>>  7/16 TA>>neg FINAL 7/20 Urine - NEG FINAL 7/13 MRSA - NEG 7/23 cdiff>> neg FINAL 7/24 pleural fluid>> ngtd 7/24 fungal>>ngtd 7/27 resp>> non-path flora 7/27 abscess>>ngtd  Goal of Therapy:  Vancomycin trough level 15-20 mcg/ml  Plan:  -Change vancomycin to 1000mg  IV q24hr -No zosyn changes needed -Will follow renal function, cultures and clinical progress -Will follow vancomycin levels as needed  Harland German, Pharm D 02/11/2014 10:11 AM

## 2014-02-11 NOTE — Progress Notes (Addendum)
17 Days Post-Op Procedure(s) (LRB): CORONARY ARTERY BYPASS GRAFTING TIMES FOUR USING LEFT INTERNAL MAMMARY ARTERY TO LAD, SAPHENOUS VEIN GRAFTS TO OM1, OM2, AND PDA (N/A) MITRAL VALVE (MV) REPLACEMENT (N/A) Subjective: Stable night- HR 80 off amio Some arrhythmia but K 3.1 CO-OX is 60% CVVH to start to remove fluid, uremia Objective: Vital signs in last 24 hours: Temp:  [97.6 F (36.4 C)-100.5 F (38.1 C)] 98.3 F (36.8 C) (07/29 0700) Pulse Rate:  [71-91] 78 (07/29 0745) Cardiac Rhythm:  [-] Normal sinus rhythm (07/29 0400) Resp:  [20-34] 26 (07/29 0745) BP: (77-130)/(34-65) 116/49 mmHg (07/29 0745) SpO2:  [93 %-100 %] 99 % (07/29 0745) Arterial Line BP: (72-135)/(34-68) 99/55 mmHg (07/29 0700) FiO2 (%):  [40 %] 40 % (07/29 0745) Weight:  [232 lb 12.9 oz (105.6 kg)] 232 lb 12.9 oz (105.6 kg) (07/29 0500)  Hemodynamic parameters for last 24 hours: CVP:  [11 mmHg-32 mmHg] 21 mmHg  Intake/Output from previous day: 07/28 0701 - 07/29 0700 In: 2114.7 [I.V.:974.7; NG/GT:890; IV Piggyback:250] Out: 705 [Urine:325; Stool:200; Chest Tube:180] Intake/Output this shift: Total I/O In: -  Out: 30 [Urine:30]    Lab Results:  Recent Labs  02/10/14 1810 02/11/14 0426  WBC 23.7* 19.7*  HGB 9.1* 9.3*  HCT 28.0* 28.3*  PLT 410* 396   BMET:  Recent Labs  02/10/14 0455 02/11/14 0426  NA 133* 135*  K 3.6* 3.1*  CL 86* 87*  CO2 26 27  GLUCOSE 198* 193*  BUN 111* 118*  CREATININE 3.05* 3.53*  CALCIUM 8.4 8.5    PT/INR: No results found for this basename: LABPROT, INR,  in the last 72 hours ABG    Component Value Date/Time   PHART 7.503* 02/09/2014 0453   HCO3 30.3* 02/09/2014 0453   TCO2 26 02/09/2014 1743   ACIDBASEDEF 0.1 01/30/2014 0339   O2SAT 63.2 02/11/2014 0426   CBG (last 3)   Recent Labs  02/10/14 2352 02/11/14 0351 02/11/14 0812  GLUCAP 208* 199* 154*    Assessment/Plan: S/P Procedure(s) (LRB): CORONARY ARTERY BYPASS GRAFTING TIMES FOUR USING LEFT  INTERNAL MAMMARY ARTERY TO LAD, SAPHENOUS VEIN GRAFTS TO OM1, OM2, AND PDA (N/A) MITRAL VALVE (MV) REPLACEMENT (N/A) Continue foley due to urinary output monitoring Tube feeds-blood sugars under better control with split doses of Lantus 25 units and sliding scale Antibiotics for prob pneumonia Will discuss trach with family  LOS: 17 days    VAN TRIGT III,[name] 02/11/2014

## 2014-02-11 NOTE — Progress Notes (Addendum)
[facility] ROUNDING NOTE   Subjective:   Interval History: more awake today. BP improved with pressors  Objective:  Vital signs in last 24 hours:  Temp:  [97.6 F (36.4 C)-100.5 F (38.1 C)] 98.1 F (36.7 C) (07/29 0800) Pulse Rate:  [71-91] 74 (07/29 0800) Resp:  [20-32] 24 (07/29 0800) BP: (77-130)/(34-65) 113/52 mmHg (07/29 0800) SpO2:  [93 %-100 %] 96 % (07/29 0800) Arterial Line BP: (72-135)/(34-68) 104/52 mmHg (07/29 0800) FiO2 (%):  [40 %] 40 % (07/29 0800) Weight:  [105.6 kg (232 lb 12.9 oz)] 105.6 kg (232 lb 12.9 oz) (07/29 0500)  Weight change: 3.6 kg (7 lb 15 oz) Filed Weights   02/09/14 0600 02/10/14 0500 02/11/14 0500  Weight: 105.1 kg (231 lb 11.3 oz) 102 kg (224 lb 13.9 oz) 105.6 kg (232 lb 12.9 oz)    Intake/Output: I/O last 3 completed shifts: In: 3366.7 [I.V.:1303.7; NG/GT:1063; IV Piggyback:1000] Out: 1010 [Urine:550; Stool:200; Chest Tube:260]   Intake/Output this shift:  Total I/O In: -  Out: 30 [Urine:30]   Gen alert and following commands, CVS: RRR, Lungs: Mostly clear, somewhat diminished in bases  Abdomen: soft, non-tender; bowel sounds normal;  Extremities: +LE edema  Wound: Clean and dry, no erythema or drainage    Basic Metabolic Panel:  Recent Labs Lab 02/05/14 0430 02/06/14 0435 02/07/14 0400  02/08/14 0330 02/09/14 0330 02/09/14 1743 02/10/14 0455 02/11/14 0426  NA 132* 133* 135*  < > 136* 134* 131* 133* 135*  K 3.3* 3.3* 3.7  < > 4.1 4.8 3.9 3.6* 3.1*  CL 87* 84* 86*  < > 88* 86* 88* 86* 87*  CO2 34* 35* 36*  --  34* 29  --  26 27  GLUCOSE 167* 154* 142*  < > 92 165* 164* 198* 193*  BUN 56* 67* 78*  < > 88* 103* 109* 111* 118*  CREATININE 1.26* 1.42* 1.69*  < > 1.92* 2.47* 3.40* 3.05* 3.53*  CALCIUM 8.0* 8.5 8.7  --  8.7 8.5  --  8.4 8.5  MG 1.6 1.7 2.1  --   --   --   --   --   --   PHOS 3.1 3.6 4.2  --   --   --   --   --   --   < > = values in this interval not displayed.  Liver Function  Tests:  Recent Labs Lab 02/06/14 0435 02/06/14 1500 02/07/14 0400 02/08/14 0330 02/10/14 0455 02/11/14 0426  AST 34  --  35 40* 43* 30  ALT 28  --  24 27 28 24   ALKPHOS 153*  --  146* 165* 152* 128*  BILITOT 1.7*  --  1.5* 1.8* 1.4* 1.2  PROT 7.0 7.0 7.0 7.1 6.6 6.5  ALBUMIN 3.2*  --  2.9* 2.9* 2.7* 2.6*   No results found for this basename: LIPASE, AMYLASE,  in the last 168 hours No results found for this basename: AMMONIA,  in the last 168 hours  CBC:  Recent Labs Lab 02/08/14 0330 02/09/14 0330 02/09/14 1743 02/10/14 0455 02/10/14 1810 02/11/14 0426  WBC 16.0* 17.1*  --  16.7* 23.7* 19.7*  HGB 10.3* 10.3* 11.6* 9.4* 9.1* 9.3*  HCT 31.8* 32.0* 34.0* 29.0* 28.0* 28.3*  MCV 94.4 93.0  --  92.7 91.8 91.9  PLT 374 429*  --  373 410* 396    Cardiac Enzymes: No results found for this basename: CKTOTAL, CKMB, CKMBINDEX, TROPONINI,  in the last 168 hours  BNP: No components found with this basename: POCBNP,   CBG:  Recent Labs Lab 02/10/14 1648 02/10/14 1924 02/10/14 2352 02/11/14 0351 02/11/14 0812  GLUCAP 251* 249* 208* 199* 154*    Microbiology: Results for orders placed during the hospital encounter of 01/25/14  MRSA PCR SCREENING     Status: None   Collection Time    01/26/14 11:10 AM      Result Value Ref Range Status   MRSA by PCR NEGATIVE  NEGATIVE Final   Comment:            The GeneXpert MRSA Assay (FDA     approved for NASAL specimens     only), is one component of a     comprehensive MRSA colonization     surveillance program. It is not     intended to diagnose MRSA     infection nor to guide or     monitor treatment for     MRSA infections.  CULTURE, RESPIRATORY (NON-EXPECTORATED)     Status: None   Collection Time    01/27/14  9:53 AM      Result Value Ref Range Status   Specimen Description TRACHEAL ASPIRATE   Final   Special Requests Normal   Final   Gram Stain     Final   Value: FEW WBC PRESENT,BOTH PMN AND MONONUCLEAR     NO  SQUAMOUS EPITHELIAL CELLS SEEN     NO ORGANISMS SEEN     Performed at [facility]   Culture     Final   Value: NO GROWTH 2 DAYS     Performed at [facility]   Report Status 01/29/2014 FINAL   Final  URINE CULTURE     Status: None   Collection Time    02/02/14 12:19 PM      Result Value Ref Range Status   Specimen Description URINE, CATHETERIZED   Final   Special Requests Normal   Final   Culture  Setup Time     Final   Value: 02/02/2014 19:42     Performed at [facility] Count     Final   Value: NO GROWTH     Performed at [facility]   Culture     Final   Value: NO GROWTH     Performed at [facility]   Report Status 02/03/2014 FINAL   Final  CLOSTRIDIUM DIFFICILE BY PCR     Status: None   Collection Time    02/05/14  5:18 PM      Result Value Ref Range Status   C difficile by pcr NEGATIVE  NEGATIVE Final  AFB CULTURE WITH SMEAR     Status: None   Collection Time    02/06/14  2:45 PM      Result Value Ref Range Status   Specimen Description FLUID PLEURAL   Final   Special Requests NONE   Final   Acid Fast Smear     Final   Value: NO ACID FAST BACILLI SEEN     Performed at [facility]   Culture     Final   Value: CULTURE WILL BE EXAMINED FOR 6 WEEKS BEFORE ISSUING A FINAL REPORT     Performed at [facility]   Report Status PENDING   Incomplete  BODY FLUID CULTURE     Status: None   Collection Time    02/06/14  2:45 PM  Result Value Ref Range Status   Specimen Description PLEURAL FLUID   Final   Special Requests NONE   Final   Gram Stain     Final   Value: NO WBC SEEN     NO ORGANISMS SEEN     Performed at [facility]   Culture     Final   Value: NO GROWTH 3 DAYS     Performed at [facility]   Report Status 02/10/2014 FINAL   Final  FUNGUS CULTURE W SMEAR     Status: None   Collection Time    02/06/14  2:46 PM      Result Value Ref Range Status   Specimen Description  FLUID PLEURAL   Final   Special Requests NONE   Final   Fungal Smear     Final   Value: NO YEAST OR FUNGAL ELEMENTS SEEN     Performed at [facility]   Culture     Final   Value: CULTURE IN PROGRESS FOR FOUR WEEKS     Performed at [facility]   Report Status PENDING   Incomplete  CULTURE, RESPIRATORY (NON-EXPECTORATED)     Status: None   Collection Time    02/09/14  8:27 AM      Result Value Ref Range Status   Specimen Description TRACHEAL ASPIRATE   Final   Special Requests NONE   Final   Gram Stain     Final   Value: FEW WBC PRESENT,BOTH PMN AND MONONUCLEAR     RARE SQUAMOUS EPITHELIAL CELLS PRESENT     MODERATE GRAM NEGATIVE RODS     FEW GRAM POSITIVE RODS     RARE GRAM POSITIVE COCCI IN PAIRS   Culture     Final   Value: Non-Pathogenic Oropharyngeal-type Flora Isolated.     Performed at [facility]   Report Status 02/11/2014 FINAL   Final  CULTURE, ROUTINE-ABSCESS     Status: None   Collection Time    02/09/14  4:08 PM      Result Value Ref Range Status   Specimen Description ABSCESS JP DRAINAGE   Final   Special Requests NONE   Final   Gram Stain     Final   Value: FEW WBC PRESENT,BOTH PMN AND MONONUCLEAR     NO SQUAMOUS EPITHELIAL CELLS SEEN     NO ORGANISMS SEEN     Performed at [facility]   Culture     Final   Value: MULTIPLE ORGANISMS PRESENT, NONE PREDOMINANT     Performed at [facility]   Report Status PENDING   Incomplete    Coagulation Studies: No results found for this basename: LABPROT, INR,  in the last 72 hours  Urinalysis: No results found for this basename: COLORURINE, APPERANCEUR, LABSPEC, PHURINE, GLUCOSEU, HGBUR, BILIRUBINUR, KETONESUR, PROTEINUR, UROBILINOGEN, NITRITE, LEUKOCYTESUR,  in the last 72 hours    Imaging: Ct Chest Wo Contrast  02/09/2014   CLINICAL DATA:  History of CABG and mitral valve replacement complicated by subsequent respiratory failure, now with large symptomatic left-sided  pleural effusion. Please evaluate left-sided pleural fluid prior to potential CT-guided left-sided percutaneous drainage catheter placement.  EXAM: CT CHEST WITHOUT CONTRAST  TECHNIQUE: Multidetector CT imaging of the chest was performed following the standard protocol without IV contrast.  COMPARISON:  None.  FINDINGS: Evaluation of the pulmonary parenchyma is degraded secondary to patient respiratory artifact.  There is a small right and moderate-sized left-sided pleural  effusions with associated atelectasis/collapse of the left lower lobe and lingula as well as partial atelectasis of the right lower lobe. Ill-defined calcifications within the collapsed left lower lobe may represent granulomas. No discrete pulmonary nodules given the limitations of this examination.  Scattered mediastinal lymph nodes individually not enlarged by size criteria with index precarinal lymph node measuring 0.8 cm in greatest short axis diameter. No definite mediastinal, hilar or axillary lymphadenopathy on this noncontrast examination.  Endotracheal to tip terminates superior to the carina. No pneumothorax. Right upper extremity approach PICC line tip terminates within the superior cavoatrial junction.  Post median sternotomy and CABG with extensive calcifications within the native coronary arteries. Epicardial pacing wires persist. There is a very minimal amount of pericardial thickening, presumably postoperative in nature. Post mitral valve replacement with extensive calcifications within the mitral valve annulus. Scattered atherosclerotic plaque within a normal caliber thoracic aorta. The left vertebral artery is incidentally noted to arise directly from the aortic arch.  Enteric tube tip terminates within the mid body of the stomach. There is a radiopaque structure seen within the left upper abdomen, incompletely imaged. There is a small amount of intra-abdominal ascites. There is an ill-defined approximately 2.2 x 1.6 cm hypo  attenuating lesion within the mid body of the spleen (image 74, series 2), which is incompletely characterized on this noncontrast examination though possibly representative of a splenic cyst and/or hemangioma. Exuberant calcifications within the splenic artery. Punctate calcifications within the dome of the right lobe of the liver, the sequela of prior granulomatous infection.  No acute or aggressive osseous abnormalities. The thyroid gland appears heterogeneous and possibly minimally enlarged post incompletely imaged on the present examination.  IMPRESSION: 1. Small right and moderate left-sided pleural effusions with associated bilateral atelectasis/collapse, left greater than right. 2. Post median sternotomy, CABG and mitral valve replacement without evidence of complication on this noncontrast examination. 3. Appropriately positioned support apparatus.  No pneumothorax. 4. Small amount of ascites seen within the imaged upper abdomen.   Electronically Signed   By: Simonne ComeJohn  Watts M.D.   On: 02/09/2014 16:26   Dg Chest Port 1 View  02/11/2014   CLINICAL DATA:  CABG.  EXAM: PORTABLE CHEST - 1 VIEW  COMPARISON:  02/10/2014 and 02/09/2014  FINDINGS: Patient is rotated to the right. Endotracheal tube has tip approximately 5 cm above the carina. Left IJ central venous catheter has tip over the SVC. Right-sided PICC line is unchanged. Enteric tube courses into the region of the stomach and off the inferior portion of the film. Left-sided chest tube unchanged.  Lungs are adequately inflated with mild improved aeration over the right base. Mild worsening hazy opacification in the left mid to lower lung and perihilar region. No definite pneumothorax. Stable cardiomegaly. Remainder of the exam is unchanged.  IMPRESSION: Worsening hazy opacification over the left perihilar region and mid to lower lung which may be due to asymmetric edema versus infection with basilar effusions/atelectasis. Improved aeration right base.   Stable cardiomegaly.  Tubes and lines as described.   Electronically Signed   By: Elberta Fortisaniel  Boyle M.D.   On: 02/11/2014 07:49   Dg Chest Port 1 View  02/10/2014   CLINICAL DATA:  Dialysis catheter placement.  EXAM: PORTABLE CHEST - 1 VIEW  COMPARISON:  Chest x-ray 02/07/2014.  FINDINGS: Endotracheal tube, NG tube, right PICC line, left IJ catheter in good anatomic position. Left chest tube noted and good anatomic position. No pneumothorax. Cardiomegaly. Prior median sternotomy and CABG. Prior valve replacement.  Pulmonary venous congestion with bilateral pulmonary alveolar infiltrates and pleural effusions consistent with congestive heart failure. Carotid atherosclerotic vascular calcification. No acute bony abnormality.  IMPRESSION: 1. Interim placement of left IJ dialysis catheter, its tip is in the region of the superior vena cava. 2. Left chest tube noted. No pneumothorax. Remainder of the lines and tubes are in stable position. 3. Congestive heart failure with pulmonary edema and bilateral pleural effusions. No significant improvement from prior study. 4. Carotid vascular disease.   Electronically Signed   By: Maisie Fus  Register   On: 02/10/2014 15:47   Dg Chest Port 1 View  02/10/2014   CLINICAL DATA:  Status post chest tube placement  EXAM: PORTABLE CHEST - 1 VIEW  COMPARISON:  02/09/2014  FINDINGS: An endotracheal tube is noted 3.7 cm above the carina. Feeding catheter extends into the stomach. The left-sided chest tube is now seen near complete reduction of the left pleural effusion. No pneumothorax is noted. Left basilar atelectasis is noted. A small right-sided pleural effusion seen. A PICC line is noted in the mid superior vena cava. Postsurgical changes are again noted.  IMPRESSION: Significant reduction in left-sided pleural effusion. Persistent left basilar atelectasis is noted. Small right-sided pleural effusion remains.  Tubes and lines as described.   Electronically Signed   By: Alcide Clever M.D.    On: 02/10/2014 07:41   Dg Chest Port 1 View  02/09/2014   CLINICAL DATA:  Hypoxia  EXAM: PORTABLE CHEST - 1 VIEW  COMPARISON:  Study obtained earlier in the day  FINDINGS: Endotracheal tube tip is 3.5 cm above the carina. Central catheter tip is in the superior vena cava. No pneumothorax.  There is a sizable effusion on the left which appears slightly smaller compared to earlier in the day. There is atelectatic change throughout much of the left lung is well. There may be superimposed airspace consolidation on the left. There is a much smaller effusion on the right, stable. Right lung is otherwise clear.  Heart is mildly enlarged. 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 No adenopathy is apparent. The pulmonary vascularity is within normal limits.  IMPRESSION: Tube and catheter positions as described without pneumothorax. Bilateral effusions, much larger on the left than on the right. Effusion on the left is slightly smaller compared to earlier in the day. There is extensive atelectatic change on the left. There may be some airspace consolidation superimposed with effusion on the left as well. No change in cardiac silhouette.   Electronically Signed   By: Bretta Bang M.D.   On: 02/09/2014 10:12   Dg Abd Portable 1v  02/10/2014   CLINICAL DATA:  Feeding tube placement  EXAM: PORTABLE ABDOMEN - 1 VIEW  COMPARISON:  Abdominal film of February 09, 2014  FINDINGS: The radiodense tip of the feeding tube lies now in the proximal jejunum. No abnormal bowel loops are demonstrated.  IMPRESSION: The feeding tube tip lies in the region of the proximal jejunum.   Electronically Signed   By: David  [place]   On: 02/10/2014 07:47   Dg Abd Portable 1v  02/09/2014   CLINICAL DATA:  Feeding tube placement  EXAM: PORTABLE ABDOMEN - 1 VIEW  COMPARISON:  February 01, 2014  FINDINGS: Feeding tube tip is below the diaphragm. It is difficult to ascertain whether the tip is in the mid to  distal stomach versus third duodenum. Bowel gas pattern is unremarkable. Temporary pacemaker wires extend to the right heart. There  is left pleural effusion.  IMPRESSION: It is difficult to ascertain whether the feeding tube tip is in the mid to distal stomach versus mid duodenum. If this differentiation will affect patient management, a small contrast injection through the tube with image confirmation could distinguish whether tube tip is in stomach versus duodenum. Bowel gas pattern unremarkable.   Electronically Signed   By: Bretta Bang M.D.   On: 02/09/2014 12:51   Ct Perc Pleural Drain W/indwell Cath W/img Guide  02/09/2014   INDICATION: Post median sternotomy, CABG and mitral valve replacement, now with persistent symptomatic left-sided pleural effusion. Please perform CT-guided drainage left chest tube placement.  EXAM: CT PERC PLEURAL DRAIN W/INDWELL CATH W/IMG GUIDE  COMPARISON:  Chest CT- earlier same day  MEDICATIONS: The patient is currently admitted to the hospital and receiving intravenous antibiotics. The antibiotics were administered within an appropriate time frame prior to the initiation of the procedure.  ANESTHESIA/SEDATION: None  CONTRAST:  None  COMPLICATIONS: None immediate  PROCEDURE: Informed written consent was obtained from the patient's family after a discussion of the risks, benefits and alternatives to treatment. The patient was placed supine on the CT gantry and a pre procedural CT was performed re-demonstrating the known moderate to large sized left-sided pleural effusion. The procedure was planned. A timeout was performed prior to the initiation of the procedure.  The skin overlying the anterior lateral aspect of the left inferior chest was prepped and draped in the usual sterile fashion. The overlying soft tissues were anesthetized with 1% lidocaine with epinephrine. Appropriate trajectory was planned with the use of a 22 gauge spinal needle. An 18 gauge trocar needle was  advanced into the left pleural space and a short Amplatz super stiff wire was coiled within the collection. Appropriate positioning was confirmed with a limited CT scan. The tract was serially dilated allowing placement of a 12 [place] all-purpose drainage catheter. Appropriate positioning was confirmed with a limited postprocedural CT scan.  Approximately 1.3 L Ml of brown, slightly red colored pleural fluid was aspirated. The tube was connected to a drainage bag and sutured in place. A dressing was placed. The patient tolerated the procedure well without immediate post procedural complication.  IMPRESSION: Successful CT guided placement of a 84 French all purpose drain catheter into the left pleural space with aspiration of approximately 1.3 L of pleural fluid. Samples were sent to the laboratory as requested by the ordering clinical team.   Electronically Signed   By: Simonne Come M.D.   On: 02/09/2014 16:30     Medications:   . sodium chloride Stopped (02/01/14 0830)  . sodium chloride 250 mL (02/11/14 0600)  . milrinone 0.25 mcg/kg/min (02/11/14 0600)  . norepinephrine (LEVOPHED) Adult infusion 11 mcg/min (02/11/14 0600)  . dialysis replacement fluid (prismasate)    . dialysis replacement fluid (prismasate)    . dialysate (PRISMASATE)     . antiseptic oral rinse  15 mL Mouth Rinse Q2H  . aspirin  324 mg Per Tube Daily  . bisacodyl  10 mg Oral Daily   Or  . bisacodyl  10 mg Rectal Daily  . chlorhexidine  15 mL Mouth Rinse BID  . feeding supplement (NEPRO CARB STEADY)  1,000 mL Oral Q24H  . feeding supplement (PRO-STAT SUGAR FREE 64)  30 mL Per Tube 5 X Daily  . insulin aspart  0-24 Units Subcutaneous 6 times per day  . insulin glargine  25 Units Subcutaneous BID  . levalbuterol  1.25 mg Nebulization  6 times per day  . levothyroxine  37.5 mcg Intravenous QAC breakfast  . pantoprazole (PROTONIX) IV  40 mg Intravenous Q24H  . piperacillin-tazobactam (ZOSYN)  IV  2.25 g Intravenous Q6H  .  potassium chloride  10 mEq Intravenous Once  . sodium chloride  10-40 mL Intracatheter Q12H  . vancomycin  1,500 mg Intravenous Q48H   heparin, ondansetron (ZOFRAN) IV, sodium chloride, sodium chloride  Assessment/ Plan:    Acute kidney injury appears to have declined with hypotension and shock although obstruction and Acute interstitial nephritis cannot be excluded. Will order urinalysis and urine na . Check renal ultrasound to rule out hydronephrosis. I would be cautious with vancomycin dosing Electrolytes stable K 3.6 and sodium 133  Acid / base Bicarbonate stable  HTN/Volume hypotension Continue pressors  Vanco trough 16 Start CRRT today  LOS: 17 Yevette Knust W @TODAY @9 :28 AM

## 2014-02-12 ENCOUNTER — Inpatient Hospital Stay (HOSPITAL_COMMUNITY): Payer: Medicare Other

## 2014-02-12 LAB — CORTISOL-AM, BLOOD: Cortisol - AM: 28.4 ug/dL — ABNORMAL HIGH (ref 4.3–22.4)

## 2014-02-12 LAB — CBC
HCT: 27.9 % — ABNORMAL LOW (ref 36.0–46.0)
Hemoglobin: 8.8 g/dL — ABNORMAL LOW (ref 12.0–15.0)
MCH: 29.5 pg (ref 26.0–34.0)
MCHC: 31.5 g/dL (ref 30.0–36.0)
MCV: 93.6 fL (ref 78.0–100.0)
Platelets: 320 10*3/uL (ref 150–400)
RBC: 2.98 MIL/uL — ABNORMAL LOW (ref 3.87–5.11)
RDW: 18.4 % — ABNORMAL HIGH (ref 11.5–15.5)
WBC: 16.5 10*3/uL — ABNORMAL HIGH (ref 4.0–10.5)

## 2014-02-12 LAB — RENAL FUNCTION PANEL
ALBUMIN: 2.3 g/dL — AB (ref 3.5–5.2)
Anion gap: 15 (ref 5–15)
BUN: 54 mg/dL — AB (ref 6–23)
CALCIUM: 8 mg/dL — AB (ref 8.4–10.5)
CO2: 25 mEq/L (ref 19–32)
Chloride: 95 mEq/L — ABNORMAL LOW (ref 96–112)
Creatinine, Ser: 1.78 mg/dL — ABNORMAL HIGH (ref 0.50–1.10)
GFR calc Af Amer: 31 mL/min — ABNORMAL LOW (ref >90)
GFR, EST NON AFRICAN AMERICAN: 27 mL/min — AB (ref >90)
Glucose, Bld: 162 mg/dL — ABNORMAL HIGH (ref 70–99)
Phosphorus: 3.1 mg/dL (ref 2.3–4.6)
Potassium: 4 mEq/L (ref 3.7–5.3)
Sodium: 135 mEq/L — ABNORMAL LOW (ref 137–147)

## 2014-02-12 LAB — COMPREHENSIVE METABOLIC PANEL
ALT: 19 U/L (ref 0–35)
AST: 24 U/L (ref 0–37)
Albumin: 2.5 g/dL — ABNORMAL LOW (ref 3.5–5.2)
Alkaline Phosphatase: 142 U/L — ABNORMAL HIGH (ref 39–117)
Anion gap: 14 (ref 5–15)
BUN: 67 mg/dL — ABNORMAL HIGH (ref 6–23)
CO2: 27 mEq/L (ref 19–32)
Calcium: 8.3 mg/dL — ABNORMAL LOW (ref 8.4–10.5)
Chloride: 95 mEq/L — ABNORMAL LOW (ref 96–112)
Creatinine, Ser: 2.07 mg/dL — ABNORMAL HIGH (ref 0.50–1.10)
GFR calc Af Amer: 26 mL/min — ABNORMAL LOW (ref >90)
GFR calc non Af Amer: 22 mL/min — ABNORMAL LOW (ref >90)
Glucose, Bld: 143 mg/dL — ABNORMAL HIGH (ref 70–99)
Potassium: 3.4 mEq/L — ABNORMAL LOW (ref 3.7–5.3)
Sodium: 136 mEq/L — ABNORMAL LOW (ref 137–147)
Total Bilirubin: 1 mg/dL (ref 0.3–1.2)
Total Protein: 6.8 g/dL (ref 6.0–8.3)

## 2014-02-12 LAB — GLUCOSE, CAPILLARY
Glucose-Capillary: 139 mg/dL — ABNORMAL HIGH (ref 70–99)
Glucose-Capillary: 145 mg/dL — ABNORMAL HIGH (ref 70–99)
Glucose-Capillary: 163 mg/dL — ABNORMAL HIGH (ref 70–99)
Glucose-Capillary: 178 mg/dL — ABNORMAL HIGH (ref 70–99)
Glucose-Capillary: 180 mg/dL — ABNORMAL HIGH (ref 70–99)
Glucose-Capillary: 185 mg/dL — ABNORMAL HIGH (ref 70–99)
Glucose-Capillary: 195 mg/dL — ABNORMAL HIGH (ref 70–99)

## 2014-02-12 LAB — CARBOXYHEMOGLOBIN
Carboxyhemoglobin: 1 % (ref 0.5–1.5)
Methemoglobin: 0.8 % (ref 0.0–1.5)
O2 Saturation: 54.6 %
Total hemoglobin: 13.7 g/dL (ref 12.0–16.0)

## 2014-02-12 LAB — PHOSPHORUS: Phosphorus: 3.5 mg/dL (ref 2.3–4.6)

## 2014-02-12 MED ORDER — MORPHINE SULFATE 2 MG/ML IJ SOLN
2.0000 mg | INTRAMUSCULAR | Status: DC | PRN
  Administered 2014-02-12: 2 mg via INTRAVENOUS
  Filled 2014-02-12: qty 1

## 2014-02-12 MED ORDER — POTASSIUM CHLORIDE 10 MEQ/50ML IV SOLN
10.0000 meq | Freq: Once | INTRAVENOUS | Status: AC
  Administered 2014-02-12: 10 meq via INTRAVENOUS
  Filled 2014-02-12: qty 50

## 2014-02-12 MED ORDER — ACETAMINOPHEN 160 MG/5ML PO SOLN
325.0000 mg | ORAL | Status: DC | PRN
  Administered 2014-02-12: 325 mg via ORAL
  Filled 2014-02-12: qty 20.3

## 2014-02-12 MED ORDER — OXYCODONE HCL 5 MG/5ML PO SOLN
5.0000 mg | ORAL | Status: DC | PRN
  Administered 2014-02-12 – 2014-02-16 (×10): 5 mg via ORAL
  Filled 2014-02-12 (×10): qty 5

## 2014-02-12 MED ORDER — HEPARIN SODIUM (PORCINE) 5000 UNIT/ML IJ SOLN
5000.0000 [IU] | Freq: Three times a day (TID) | INTRAMUSCULAR | Status: DC
  Administered 2014-02-12 – 2014-02-16 (×13): 5000 [IU] via SUBCUTANEOUS
  Filled 2014-02-12 (×14): qty 1

## 2014-02-12 MED ORDER — POTASSIUM CHLORIDE CRYS ER 20 MEQ PO TBCR: 20.0000 meq | EXTENDED_RELEASE_TABLET | Freq: Once | ORAL | Status: DC

## 2014-02-12 NOTE — Progress Notes (Signed)
PT Cancellation Note/ Discharge  Patient Details Name: [name] MRN: [id number] DOB: 1940-08-21   Cancelled Treatment:    Reason Eval/Treat Not Completed: Medical issues which prohibited therapy (pt on vent on CRRT and will sign off and await new order)   Delorse Lek 02/12/2014, 7:44 AM Delaney Meigs, PT [phone]

## 2014-02-12 NOTE — Progress Notes (Addendum)
PULMONARY / CRITICAL CARE MEDICINE  Name: [name] MRN: [id number] DOB: 06-16-1941    ADMISSION DATE:  01/25/2014 CONSULTATION DATE:  02/12/2014  REFERRING MD :  Servando Snare  CHIEF COMPLAINT:  Hypoxemia  INITIAL PRESENTATION: 73 yo admitted 7/12 with STEMI. Taken to cath lab but unable to perform PCI due to significant RCA disease. Taken to OR emergently for CABG x 4.  Returned to SICU mechanically ventilated.   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 Reintubated 7/27 FOB >>> no overt airway obstruction noted 7/27 CT chest >>> large L effusion, moderate R effusion 7/27 CT guided pleural catheter placed by IR. 1300 cc of serosanguinous fluid  7/28 Renal consult >>> CRRT initiated  INTERVAL HISTORY:  Minimal vasopressor / oxygen requirements.  No acute events.  VITAL SIGNS: Temp:  [96.1 F (35.6 C)-98.3 F (36.8 C)] 97 F (36.1 C) (07/30 0800) Pulse Rate:  [73-80] 78 (07/30 0834) Resp:  [0-33] 33 (07/30 0834) BP: (95-136)/(45-85) 123/57 mmHg (07/30 0834) SpO2:  [97 %-100 %] 97 % (07/30 0834) Arterial Line BP: (76-134)/(47-66) 107/50 mmHg (07/30 0800) FiO2 (%):  [40 %] 40 % (07/30 0834) Weight:  [106.6 kg (235 lb 0.2 oz)] 106.6 kg (235 lb 0.2 oz) (07/30 0500)  HEMODYNAMICS: CVP:  [13 mmHg-31 mmHg] 14 mmHg  VENTILATOR SETTINGS: Vent Mode:  [-] PRVC FiO2 (%):  [40 %] 40 % Set Rate:  [15 bmp] 15 bmp Vt Set:  [500 mL] 500 mL PEEP:  [5 cmH20] 5 cmH20 Plateau Pressure:  [12 VKP22-44 cmH20] 16 cmH20  INTAKE / OUTPUT: Intake/Output     07/29 0701 - 07/30 0700 07/30 0701 - 07/31 0700   I.V. (mL/kg) 656.6 (6.2) 27.3 (0.3)   NG/GT 1640 40   IV Piggyback 500    Total Intake(mL/kg) 2796.6 (26.2) 67.3 (0.6)   Urine (mL/kg/hr) 390 (0.2) 10 (0.1)   Other 1660 (0.6) 101 (0.5)    Stool 725 (0.3)    Chest Tube 40 (0)    Total Output 2815 111   Net -18.4 -43.7         PHYSICAL EXAMINATION: General: No distress, synchronous Neuro: Wakes up to voice, following commans HEENT: OETT, Panda tube Cardiovascular: Regular, no murmurs Lungs: Bilateral air entry, no added sounds Abdomen: Soft, non tender, bowel sounds present Ext: Bilateral symmetric pitting edema  LABS: CBC  Recent Labs Lab 02/10/14 1810 02/11/14 0426 02/12/14 0400  WBC 23.7* 19.7* 16.5*  HGB 9.1*  14.3 9.3* 8.8*  HCT 28.0*  42.0 28.3* 27.9*  PLT 410* 396 320   Coag's  Recent Labs Lab 02/08/14 0730 02/08/14 1600 02/09/14 0330  APTT 115* 88* 113*   BMET  Recent Labs Lab 02/11/14 0426 02/11/14 1500 02/12/14 0400  NA 135* 133* 136*  K 3.1* 3.1* 3.4*  CL 87* 88* 95*  CO2 27 26 27   BUN 118* 96* 67*  CREATININE 3.53* 2.82* 2.07*  GLUCOSE 193* 217* 143*   Electrolytes  Recent Labs Lab 02/06/14 0435 02/07/14 0400  02/11/14 0426 02/11/14 1500 02/12/14 0400  CALCIUM 8.5 8.7  < > 8.5 8.2* 8.3*  MG 1.7 2.1  --   --   --   --   PHOS 3.6 4.2  --   --   --  3.5  < > =  values in this interval not displayed.  Sepsis Markers No results found for this basename: LATICACIDVEN, PROCALCITON, O2SATVEN,  in the last 168 hours  ABG  Recent Labs Lab 02/07/14 0437 02/08/14 0500 02/09/14 0453  PHART 7.516* 7.499* 7.503*  PCO2ART 42.7 45.3* 38.5  PO2ART 87.5 84.0 114.0*   Liver Enzymes  Recent Labs Lab 02/10/14 0455 02/11/14 0426 02/12/14 0400  AST 43* 30 24  ALT 28 24 19   ALKPHOS 152* 128* 142*  BILITOT 1.4* 1.2 1.0  ALBUMIN 2.7* 2.6* 2.5*   Cardiac Enzymes No results found for this basename: TROPONINI, PROBNP,  in the last 168 hours  Glucose  Recent Labs Lab 02/11/14 1131 02/11/14 1607 02/11/14 1926 02/12/14 0008 02/12/14 0404 02/12/14 0743  GLUCAP 180* 192* 190* 195* 145* 163*   IMAGING:  US Renal Port  02/11/2014   CLINICAL DATA:  Acute renal  failure. History of diabetes and hypertension.  EXAM: RENAL/URINARY TRACT ULTRASOUND COMPLETE  COMPARISON:  Chest CT 02/09/2014  FINDINGS: Right Kidney:  Length: 10.7 cm. There is mild renal cortical thinning and increased echogenicity. The renal sinus fat is prominent. There is no hydronephrosis or focal cortical abnormality.  Left Kidney:  Length: 10.4 cm. Echogenicity within normal limits. No mass or hydronephrosis visualized.  Bladder:  Decompressed by Foley catheter and not visualized.  Ascites and a right pleural effusion are noted.  IMPRESSION: No evidence of hydronephrosis. The right kidney demonstrates mild cortical thinning and increased echogenicity. Bladder is not visualized, appearing decompressed by Foley catheter.   Electronically Signed   By: Camie Patience M.D.   On: 02/11/2014 16:25   Dg Chest Port 1 View  02/12/2014   CLINICAL DATA:  Status post mitral valve replacement  EXAM: PORTABLE CHEST - 1 VIEW  COMPARISON:  Portable chest x-ray of February 07, 2014  FINDINGS: The lungs are well-expanded. There has been interval improvement in the appearance of the left lung base. The pigtail catheter is unchanged in position. On the right there is density over the lower hemi thorax which may reflect pleural fluid layering posteriorly. This is more conspicuous today but could reflect the fact that today's exam was performed in the semi upright position versus yesterday's upright position.  The cardiac silhouette is normal in size. The mitral valve ring is visible as are coronary artery graft markers. The pulmonary vascularity is prominent centrally but is more distinct today. The endotracheal tube tip lies approximately 3.4 cm above the crotch of the carina. The feeding tube tip projects off the inferior margin of the study. The left internal jugular venous catheter tip lies in the midportion of the SVC.  IMPRESSION: There has been slight interval improvement in the appearance of the pulmonary interstitium on  the left. There remain findings of mild CHF. There is a right-sided pleural effusion.   Electronically Signed   By: David  [place]   On: 02/12/2014 07:50   Dg Chest Port 1 View  02/11/2014   CLINICAL DATA:  CABG.  EXAM: PORTABLE CHEST - 1 VIEW  COMPARISON:  02/10/2014 and 02/09/2014  FINDINGS: Patient is rotated to the right. Endotracheal tube has tip approximately 5 cm above the carina. Left IJ central venous catheter has tip over the SVC. Right-sided PICC line is unchanged. Enteric tube courses into the region of the stomach and off the inferior portion of the film. Left-sided chest tube unchanged.  Lungs are adequately inflated with mild improved aeration over the right base. Mild worsening hazy opacification in the left mid  to lower lung and perihilar region. No definite pneumothorax. Stable cardiomegaly. Remainder of the exam is unchanged.  IMPRESSION: Worsening hazy opacification over the left perihilar region and mid to lower lung which may be due to asymmetric edema versus infection with basilar effusions/atelectasis. Improved aeration right base.  Stable cardiomegaly.  Tubes and lines as described.   Electronically Signed   By: Marin Olp M.D.   On: 02/11/2014 07:49   Dg Chest Port 1 View  02/10/2014   CLINICAL DATA:  Dialysis catheter placement.  EXAM: PORTABLE CHEST - 1 VIEW  COMPARISON:  Chest x-ray 02/07/2014.  FINDINGS: Endotracheal tube, NG tube, right PICC line, left IJ catheter in good anatomic position. Left chest tube noted and good anatomic position. No pneumothorax. Cardiomegaly. Prior median sternotomy and CABG. Prior valve replacement. Pulmonary venous congestion with bilateral pulmonary alveolar infiltrates and pleural effusions consistent with congestive heart failure. Carotid atherosclerotic vascular calcification. No acute bony abnormality.  IMPRESSION: 1. Interim placement of left IJ dialysis catheter, its tip is in the region of the superior vena cava. 2. Left chest tube noted.  No pneumothorax. Remainder of the lines and tubes are in stable position. 3. Congestive heart failure with pulmonary edema and bilateral pleural effusions. No significant improvement from prior study. 4. Carotid vascular disease.   Electronically Signed   By: Marcello Moores  Register   On: 02/10/2014 15:47   ASSESSMENT / PLAN:  Acute hypoxemic respiratory failure Goal SpO2>92, pH>7.30 Mechanical vent support SBT, considering extubation if criteria are met May need more fluid off before extubated Would not trach yet as potentially reversible cause of failure and may improve with CRRT / fluid management   Large L pleural effusion - s/p pleural cath drainage 7/27 Chest tube management per TCTS  AKI Acute on chronic diastolic heart failure Pulmonary edema Stay balance + 7L Renal following CRRT Consider increase fluid removal to 150 mL/ h, even if it may require temporary increase in vasopressors Continue Levophed / Milrinone  Suspected HCAP Continue Zosyn D/c Vancomycin ( MRSA PCR / respiratory cx are negative )  Px Protonix Add Heparin   I have personally obtained history, examined patient, evaluated and interpreted laboratory and imaging results, reviewed medical records, formulated assessment / plan and placed orders.  CRITICAL CARE:  The patient is critically ill with multiple organ systems failure and requires high complexity decision making for assessment and support, frequent evaluation and titration of therapies, application of advanced monitoring technologies and extensive interpretation of multiple databases. Critical Care Time devoted to patient care services described in this note is 35 minutes.   Doree Fudge, MD Pulmonary and [place] Pager: [phone]  02/12/2014, 8:52 AM

## 2014-02-12 NOTE — Progress Notes (Signed)
CT surgery p.m. Rounds Patient had stable day CVVH removing 50-75 cc fluid per hour P.m. renal panel satisfactory Patient was more alert today and responsive to commands, currently mildly sedated but comfortable Patient requiring bear hugger warmer while on CVVH circuit

## 2014-02-12 NOTE — Progress Notes (Signed)
[facility] ROUNDING NOTE   Subjective:   Interval History: appears better still with edema    Objective:  Vital signs in last 24 hours:  Temp:  [96.1 F (35.6 C)-98.3 F (36.8 C)] 97.6 F (36.4 C) (07/30 0900) Pulse Rate:  [73-80] 79 (07/30 0900) Resp:  [0-33] 25 (07/30 0900) BP: (95-136)/(45-85) 120/52 mmHg (07/30 0900) SpO2:  [97 %-100 %] 99 % (07/30 0900) Arterial Line BP: (76-134)/(47-66) 113/49 mmHg (07/30 0900) FiO2 (%):  [40 %] 40 % (07/30 0834) Weight:  [106.6 kg (235 lb 0.2 oz)] 106.6 kg (235 lb 0.2 oz) (07/30 0500)  Weight change: 1 kg (2 lb 3.3 oz) Filed Weights   02/10/14 0500 02/11/14 0500 02/12/14 0500  Weight: 102 kg (224 lb 13.9 oz) 105.6 kg (232 lb 12.9 oz) 106.6 kg (235 lb 0.2 oz)    Intake/Output: I/O last 3 completed shifts: In: 3761.5 [I.V.:1191.5; NG/GT:1970; IV Piggyback:600] Out: 3285 [Urine:630; Other:1660; Stool:925; Chest Tube:70]   Intake/Output this shift:  Total I/O In: 134.6 [I.V.:54.6; NG/GT:80] Out: 206 [Urine:20; Other:186]  Gen alert and following commands,  CVS: RRR,  Lungs: Mostly clear, somewhat diminished in bases  Abdomen: soft, non-tender; bowel sounds normal;  Extremities: +LE edema  Wound: Clean and dry, no erythema or drainage    Basic Metabolic Panel:  Recent Labs Lab 02/06/14 0435 02/07/14 0400  02/09/14 0330  02/10/14 0455 02/10/14 1810 02/11/14 0426 02/11/14 1500 02/12/14 0400  NA 133* 135*  < > 134*  < > 133* 133* 135* 133* 136*  K 3.3* 3.7  < > 4.8  < > 3.6* 3.1* 3.1* 3.1* 3.4*  CL 84* 86*  < > 86*  < > 86* 91* 87* 88* 95*  CO2 35* 36*  < > 29  --  26  --  27 26 27   GLUCOSE 154* 142*  < > 165*  < > 198* 257* 193* 217* 143*  BUN 67* 78*  < > 103*  < > 111* 108* 118* 96* 67*  CREATININE 1.42* 1.69*  < > 2.47*  < > 3.05* 3.70* 3.53* 2.82* 2.07*  CALCIUM 8.5 8.7  < > 8.5  --  8.4  --  8.5 8.2* 8.3*  MG 1.7 2.1  --   --   --   --   --   --   --   --   PHOS 3.6 4.2  --   --   --   --   --    --   --  3.5  < > = values in this interval not displayed.  Liver Function Tests:  Recent Labs Lab 02/07/14 0400 02/08/14 0330 02/10/14 0455 02/11/14 0426 02/12/14 0400  AST 35 40* 43* 30 24  ALT 24 27 28 24 19   ALKPHOS 146* 165* 152* 128* 142*  BILITOT 1.5* 1.8* 1.4* 1.2 1.0  PROT 7.0 7.1 6.6 6.5 6.8  ALBUMIN 2.9* 2.9* 2.7* 2.6* 2.5*   No results found for this basename: LIPASE, AMYLASE,  in the last 168 hours No results found for this basename: AMMONIA,  in the last 168 hours  CBC:  Recent Labs Lab 02/09/14 0330 02/09/14 1743 02/10/14 0455 02/10/14 1810 02/11/14 0426 02/12/14 0400  WBC 17.1*  --  16.7* 23.7* 19.7* 16.5*  HGB 10.3* 11.6* 9.4* 9.1*  14.3 9.3* 8.8*  HCT 32.0* 34.0* 29.0* 28.0*  42.0 28.3* 27.9*  MCV 93.0  --  92.7 91.8 91.9 93.6  PLT 429*  --  373 410* 396  320    Cardiac Enzymes: No results found for this basename: CKTOTAL, CKMB, CKMBINDEX, TROPONINI,  in the last 168 hours  BNP: No components found with this basename: POCBNP,   CBG:  Recent Labs Lab 02/11/14 1607 02/11/14 1926 02/12/14 0008 02/12/14 0404 02/12/14 0743  GLUCAP 192* 190* 195* 145* 163*    Microbiology: Results for orders placed during the hospital encounter of 01/25/14  MRSA PCR SCREENING     Status: None   Collection Time    01/26/14 11:10 AM      Result Value Ref Range Status   MRSA by PCR NEGATIVE  NEGATIVE Final   Comment:            The GeneXpert MRSA Assay (FDA     approved for NASAL specimens     only), is one component of a     comprehensive MRSA colonization     surveillance program. It is not     intended to diagnose MRSA     infection nor to guide or     monitor treatment for     MRSA infections.  CULTURE, RESPIRATORY (NON-EXPECTORATED)     Status: None   Collection Time    01/27/14  9:53 AM      Result Value Ref Range Status   Specimen Description TRACHEAL ASPIRATE   Final   Special Requests Normal   Final   Gram Stain     Final   Value: FEW  WBC PRESENT,BOTH PMN AND MONONUCLEAR     NO SQUAMOUS EPITHELIAL CELLS SEEN     NO ORGANISMS SEEN     Performed at [facility]   Culture     Final   Value: NO GROWTH 2 DAYS     Performed at [facility]   Report Status 01/29/2014 FINAL   Final  URINE CULTURE     Status: None   Collection Time    02/02/14 12:19 PM      Result Value Ref Range Status   Specimen Description URINE, CATHETERIZED   Final   Special Requests Normal   Final   Culture  Setup Time     Final   Value: 02/02/2014 19:42     Performed at [facility]   Colony Count     Final   Value: NO GROWTH     Performed at [facility]   Culture     Final   Value: NO GROWTH     Performed at [facility]   Report Status 02/03/2014 FINAL   Final  CLOSTRIDIUM DIFFICILE BY PCR     Status: None   Collection Time    02/05/14  5:18 PM      Result Value Ref Range Status   C difficile by pcr NEGATIVE  NEGATIVE Final  AFB CULTURE WITH SMEAR     Status: None   Collection Time    02/06/14  2:45 PM      Result Value Ref Range Status   Specimen Description FLUID PLEURAL   Final   Special Requests NONE   Final   Acid Fast Smear     Final   Value: NO ACID FAST BACILLI SEEN     Performed at [facility]   Culture     Final   Value: CULTURE WILL BE EXAMINED FOR 6 WEEKS BEFORE ISSUING A FINAL REPORT     Performed at [facility]   Report Status PENDING   Incomplete  BODY FLUID CULTURE     Status: None   Collection Time    02/06/14  2:45 PM      Result Value Ref Range Status   Specimen Description PLEURAL FLUID   Final   Special Requests NONE   Final   Gram Stain     Final   Value: NO WBC SEEN     NO ORGANISMS SEEN     Performed at [facility]   Culture     Final   Value: NO GROWTH 3 DAYS     Performed at [facility]   Report Status 02/10/2014 FINAL   Final  FUNGUS CULTURE W SMEAR     Status: None   Collection Time    02/06/14  2:46 PM      Result  Value Ref Range Status   Specimen Description FLUID PLEURAL   Final   Special Requests NONE   Final   Fungal Smear     Final   Value: NO YEAST OR FUNGAL ELEMENTS SEEN     Performed at [facility]   Culture     Final   Value: CULTURE IN PROGRESS FOR FOUR WEEKS     Performed at [facility]   Report Status PENDING   Incomplete  CULTURE, RESPIRATORY (NON-EXPECTORATED)     Status: None   Collection Time    02/09/14  8:27 AM      Result Value Ref Range Status   Specimen Description TRACHEAL ASPIRATE   Final   Special Requests NONE   Final   Gram Stain     Final   Value: FEW WBC PRESENT,BOTH PMN AND MONONUCLEAR     RARE SQUAMOUS EPITHELIAL CELLS PRESENT     MODERATE GRAM NEGATIVE RODS     FEW GRAM POSITIVE RODS     RARE GRAM POSITIVE COCCI IN PAIRS   Culture     Final   Value: Non-Pathogenic Oropharyngeal-type Flora Isolated.     Performed at [facility]   Report Status 02/11/2014 FINAL   Final  CULTURE, ROUTINE-ABSCESS     Status: None   Collection Time    02/09/14  4:08 PM      Result Value Ref Range Status   Specimen Description ABSCESS JP DRAINAGE   Final   Special Requests NONE   Final   Gram Stain     Final   Value: FEW WBC PRESENT,BOTH PMN AND MONONUCLEAR     NO SQUAMOUS EPITHELIAL CELLS SEEN     NO ORGANISMS SEEN     Performed at [facility]   Culture     Final   Value: MULTIPLE ORGANISMS PRESENT, NONE PREDOMINANT     Performed at [facility]   Report Status PENDING   Incomplete    Coagulation Studies: No results found for this basename: LABPROT, INR,  in the last 72 hours  Urinalysis:  Recent Labs  02/11/14 1140  COLORURINE YELLOW  LABSPEC 1.020  PHURINE 6.0  GLUCOSEU NEGATIVE  HGBUR MODERATE*  BILIRUBINUR NEGATIVE  KETONESUR NEGATIVE  PROTEINUR 100*  UROBILINOGEN 0.2  NITRITE NEGATIVE  LEUKOCYTESUR MODERATE*      Imaging: US Renal Port  02/11/2014   CLINICAL DATA:  Acute renal failure. History of  diabetes and hypertension.  EXAM: RENAL/URINARY TRACT ULTRASOUND COMPLETE  COMPARISON:  Chest CT 02/09/2014  FINDINGS: Right Kidney:  Length: 10.7 cm. There is mild renal cortical thinning and increased echogenicity. The renal sinus fat is prominent.  There is no hydronephrosis or focal cortical abnormality.  Left Kidney:  Length: 10.4 cm. Echogenicity within normal limits. No mass or hydronephrosis visualized.  Bladder:  Decompressed by Foley catheter and not visualized.  Ascites and a right pleural effusion are noted.  IMPRESSION: No evidence of hydronephrosis. The right kidney demonstrates mild cortical thinning and increased echogenicity. Bladder is not visualized, appearing decompressed by Foley catheter.   Electronically Signed   By: Roxy Horseman M.D.   On: 02/11/2014 16:25   Dg Chest Port 1 View  02/12/2014   CLINICAL DATA:  Status post mitral valve replacement  EXAM: PORTABLE CHEST - 1 VIEW  COMPARISON:  Portable chest x-ray of February 07, 2014  FINDINGS: The lungs are well-expanded. There has been interval improvement in the appearance of the left lung base. The pigtail catheter is unchanged in position. On the right there is density over the lower hemi thorax which may reflect pleural fluid layering posteriorly. This is more conspicuous today but could reflect the fact that today's exam was performed in the semi upright position versus yesterday's upright position.  The cardiac silhouette is normal in size. The mitral valve ring is visible as are coronary artery graft markers. The pulmonary vascularity is prominent centrally but is more distinct today. The endotracheal tube tip lies approximately 3.4 cm above the crotch of the carina. The feeding tube tip projects off the inferior margin of the study. The left internal jugular venous catheter tip lies in the midportion of the SVC.  IMPRESSION: There has been slight interval improvement in the appearance of the pulmonary interstitium on the left. There  remain findings of mild CHF. There is a right-sided pleural effusion.   Electronically Signed   By: David  [place]   On: 02/12/2014 07:50   Dg Chest Port 1 View  02/11/2014   CLINICAL DATA:  CABG.  EXAM: PORTABLE CHEST - 1 VIEW  COMPARISON:  02/10/2014 and 02/09/2014  FINDINGS: Patient is rotated to the right. Endotracheal tube has tip approximately 5 cm above the carina. Left IJ central venous catheter has tip over the SVC. Right-sided PICC line is unchanged. Enteric tube courses into the region of the stomach and off the inferior portion of the film. Left-sided chest tube unchanged.  Lungs are adequately inflated with mild improved aeration over the right base. Mild worsening hazy opacification in the left mid to lower lung and perihilar region. No definite pneumothorax. Stable cardiomegaly. Remainder of the exam is unchanged.  IMPRESSION: Worsening hazy opacification over the left perihilar region and mid to lower lung which may be due to asymmetric edema versus infection with basilar effusions/atelectasis. Improved aeration right base.  Stable cardiomegaly.  Tubes and lines as described.   Electronically Signed   By: Elberta Fortis M.D.   On: 02/11/2014 07:49   Dg Chest Port 1 View  02/10/2014   CLINICAL DATA:  Dialysis catheter placement.  EXAM: PORTABLE CHEST - 1 VIEW  COMPARISON:  Chest x-ray 02/07/2014.  FINDINGS: Endotracheal tube, NG tube, right PICC line, left IJ catheter in good anatomic position. Left chest tube noted and good anatomic position. No pneumothorax. Cardiomegaly. Prior median sternotomy and CABG. Prior valve replacement. Pulmonary venous congestion with bilateral pulmonary alveolar infiltrates and pleural effusions consistent with congestive heart failure. Carotid atherosclerotic vascular calcification. No acute bony abnormality.  IMPRESSION: 1. Interim placement of left IJ dialysis catheter, its tip is in the region of the superior vena cava. 2. Left chest tube noted. No pneumothorax.  Remainder  of the lines and tubes are in stable position. 3. Congestive heart failure with pulmonary edema and bilateral pleural effusions. No significant improvement from prior study. 4. Carotid vascular disease.   Electronically Signed   By: Maisie Fus  Register   On: 02/10/2014 15:47     Medications:   . sodium chloride Stopped (02/01/14 0830)  . sodium chloride 250 mL (02/12/14 0900)  . milrinone 0.25 mcg/kg/min (02/12/14 0900)  . norepinephrine (LEVOPHED) Adult infusion 10.027 mcg/min (02/12/14 0900)  . dialysis replacement fluid (prismasate) 300 mL/hr at 02/12/14 0422  . dialysis replacement fluid (prismasate) 200 mL/hr at 02/12/14 0106  . dialysate (PRISMASATE) 2,000 mL/hr at 02/12/14 0846   . antiseptic oral rinse  7 mL Mouth Rinse Q2H  . aspirin  324 mg Per Tube Daily  . bisacodyl  10 mg Oral Daily   Or  . bisacodyl  10 mg Rectal Daily  . chlorhexidine  15 mL Mouth Rinse BID  . feeding supplement (NEPRO CARB STEADY)  1,000 mL Oral Q24H  . feeding supplement (PRO-STAT SUGAR FREE 64)  30 mL Per Tube 5 X Daily  . heparin subcutaneous  5,000 Units Subcutaneous 3 times per day  . insulin aspart  0-24 Units Subcutaneous 6 times per day  . insulin glargine  25 Units Subcutaneous BID  . levalbuterol  1.25 mg Nebulization 6 times per day  . levothyroxine  37.5 mcg Intravenous QAC breakfast  . pantoprazole (PROTONIX) IV  40 mg Intravenous Q24H  . piperacillin-tazobactam (ZOSYN)  IV  2.25 g Intravenous Q6H  . sodium chloride  10-40 mL Intracatheter Q12H   heparin, ondansetron (ZOFRAN) IV, sodium chloride, sodium chloride  Assessment/ Plan:   Acute kidney injury appears to have declined with hypotension and shock although obstruction and Acute interstitial nephritis cannot be excluded. Will order urinalysis and urine na . Check renal ultrasound to rule out hydronephrosis. I would be cautious with vancomycin dosing  Electrolytes stable K 3.4 and sodium 136 Acid / base Bicarbonate  27 HTN/Volume will try to remove fluid 100 / hr Vanco trough 16  Tolerating CRRT  LOS: 18 Kennetta Pavlovic W @TODAY @9 :52 AM

## 2014-02-12 NOTE — Progress Notes (Addendum)
[facility] DAILY ICU PROGRESS NOTE                   [address]            [place] [address]          [phone]   18 Days Post-Op Procedure(s) (LRB): CORONARY ARTERY BYPASS GRAFTING TIMES FOUR USING LEFT INTERNAL MAMMARY ARTERY TO LAD, SAPHENOUS VEIN GRAFTS TO OM1, OM2, AND PDA (N/A) MITRAL VALVE (MV) REPLACEMENT (N/A)  Total Length of Stay:  LOS: 18 days   Subjective: Per RN, pt has been awake all night and following commands.  Presently, resting comfortably on vent, drowsy but arousable.   Objective: Vital signs in last 24 hours: Temp:  [96.1 F (35.6 C)-98.3 F (36.8 C)] 96.8 F (36 C) (07/30 0700) Pulse Rate:  [73-80] 77 (07/30 0700) Cardiac Rhythm:  [-] Atrial flutter (07/30 0700) Resp:  [0-29] 25 (07/30 0700) BP: (95-136)/(45-85) 125/55 mmHg (07/30 0700) SpO2:  [96 %-100 %] 98 % (07/30 0725) Arterial Line BP: (76-134)/(47-66) 129/53 mmHg (07/30 0700) FiO2 (%):  [40 %] 40 % (07/30 0725) Weight:  [235 lb 0.2 oz (106.6 kg)] 235 lb 0.2 oz (106.6 kg) (07/30 0500)  Filed Weights   02/10/14 0500 02/11/14 0500 02/12/14 0500  Weight: 224 lb 13.9 oz (102 kg) 232 lb 12.9 oz (105.6 kg) 235 lb 0.2 oz (106.6 kg)    Weight change: 2 lb 3.3 oz (1 kg)   Hemodynamic parameters for last 24 hours: CVP:  [13 mmHg-31 mmHg] 18 mmHg  Intake/Output from previous day: 07/29 0701 - 07/30 0700 In: 2796.6 [I.V.:656.6; NG/GT:1640; IV Piggyback:500] Out: 2815 [Urine:390; Stool:725; Chest Tube:40]  CBGs [id number]     Current Meds: Scheduled Meds: . antiseptic oral rinse  7 mL Mouth Rinse Q2H  . aspirin  324 mg Per Tube Daily  . bisacodyl  10 mg Oral Daily   Or  . bisacodyl  10 mg Rectal Daily  . chlorhexidine  15 mL Mouth Rinse BID  . feeding supplement (NEPRO CARB STEADY)  1,000 mL Oral Q24H  . feeding supplement (PRO-STAT SUGAR FREE 64)  30 mL Per Tube 5 X Daily  . insulin aspart  0-24 Units Subcutaneous 6 times per day  . insulin glargine  25 Units  Subcutaneous BID  . levalbuterol  1.25 mg Nebulization 6 times per day  . levothyroxine  37.5 mcg Intravenous QAC breakfast  . pantoprazole (PROTONIX) IV  40 mg Intravenous Q24H  . piperacillin-tazobactam (ZOSYN)  IV  2.25 g Intravenous Q6H  . sodium chloride  10-40 mL Intracatheter Q12H  . vancomycin  1,000 mg Intravenous Q24H   Continuous Infusions: . sodium chloride Stopped (02/01/14 0830)  . sodium chloride 250 mL (02/11/14 0600)  . milrinone 0.25 mcg/kg/min (02/12/14 0708)  . norepinephrine (LEVOPHED) Adult infusion 10 mcg/min (02/12/14 0100)  . dialysis replacement fluid (prismasate) 300 mL/hr at 02/12/14 0422  . dialysis replacement fluid (prismasate) 200 mL/hr at 02/12/14 0106  . dialysate (PRISMASATE) 2,000 mL/hr at 02/12/14 0545   PRN Meds:.heparin, ondansetron (ZOFRAN) IV, sodium chloride, sodium chloride  Physical Exam: General appearance: Drowsy but arousable on vent Heart: regular rate and rhythm Lungs: Coarse BS bilaterally  Abdomen: soft, non-tender; bowel sounds normal; no masses,  no organomegaly Extremities: Decreased LE edema Wound: Clean and dry, no erythema Chest tube: No air leak    Lab Results: CBC: Recent Labs  02/11/14 0426 02/12/14 0400  WBC 19.7* 16.5*  HGB 9.3* 8.8*  HCT 28.3* 27.9*  PLT 396 320   BMET:  Recent Labs  02/11/14 1500 02/12/14 0400  NA 133* 136*  K 3.1* 3.4*  CL 88* 95*  CO2 26 27  GLUCOSE 217* 143*  BUN 96* 67*  CREATININE 2.82* 2.07*  CALCIUM 8.2* 8.3*    PT/INR: No results found for this basename: LABPROT, INR,  in the last 72 hours Radiology: 02/12/2014 PORTABLE CHEST - 1 VIEW  COMPARISON: Portable chest x-ray of February 07, 2014  FINDINGS:  The lungs are well-expanded. There has been interval improvement in  the appearance of the left lung base. The pigtail catheter is  unchanged in position. On the right there is density over the lower  hemi thorax which may reflect pleural fluid layering posteriorly.  This is  more conspicuous today but could reflect the fact that  today's exam was performed in the semi upright position versus  yesterday's upright position.  The cardiac silhouette is normal in size. The mitral valve ring is  visible as are coronary artery graft markers. The pulmonary  vascularity is prominent centrally but is more distinct today. The  endotracheal tube tip lies approximately 3.4 cm above the crotch of  the carina. The feeding tube tip projects off the inferior margin of  the study. The left internal jugular venous catheter tip lies in the  midportion of the SVC.  IMPRESSION:  There has been slight interval improvement in the appearance of the  pulmonary interstitium on the left. There remain findings of mild  CHF. There is a right-sided pleural effusion.    US Renal Port  02/11/2014   CLINICAL DATA:  Acute renal failure. History of diabetes and hypertension.  EXAM: RENAL/URINARY TRACT ULTRASOUND COMPLETE  COMPARISON:  Chest CT 02/09/2014  FINDINGS: Right Kidney:  Length: 10.7 cm. There is mild renal cortical thinning and increased echogenicity. The renal sinus fat is prominent. There is no hydronephrosis or focal cortical abnormality.  Left Kidney:  Length: 10.4 cm. Echogenicity within normal limits. No mass or hydronephrosis visualized.  Bladder:  Decompressed by Foley catheter and not visualized.  Ascites and a right pleural effusion are noted.  IMPRESSION: No evidence of hydronephrosis. The right kidney demonstrates mild cortical thinning and increased echogenicity. Bladder is not visualized, appearing decompressed by Foley catheter.   Electronically Signed   By: Roxy Horseman M.D.   On: 02/11/2014 16:25   Dg Chest Port 1 View  02/11/2014   CLINICAL DATA:  CABG.  EXAM: PORTABLE CHEST - 1 VIEW  COMPARISON:  02/10/2014 and 02/09/2014  FINDINGS: Patient is rotated to the right. Endotracheal tube has tip approximately 5 cm above the carina. Left IJ central venous catheter has tip over  the SVC. Right-sided PICC line is unchanged. Enteric tube courses into the region of the stomach and off the inferior portion of the film. Left-sided chest tube unchanged.  Lungs are adequately inflated with mild improved aeration over the right base. Mild worsening hazy opacification in the left mid to lower lung and perihilar region. No definite pneumothorax. Stable cardiomegaly. Remainder of the exam is unchanged.  IMPRESSION: Worsening hazy opacification over the left perihilar region and mid to lower lung which may be due to asymmetric edema versus infection with basilar effusions/atelectasis. Improved aeration right base.  Stable cardiomegaly.  Tubes and lines as described.   Electronically Signed   By: Elberta Fortis M.D.   On: 02/11/2014 07:49   Dg Chest Port 1 View  02/10/2014   CLINICAL DATA:  Dialysis catheter placement.  EXAM: PORTABLE CHEST - 1 VIEW  COMPARISON:  Chest x-ray 02/07/2014.  FINDINGS: Endotracheal tube, NG tube, right PICC line, left IJ catheter in good anatomic position. Left chest tube noted and good anatomic position. No pneumothorax. Cardiomegaly. Prior median sternotomy and CABG. Prior valve replacement. Pulmonary venous congestion with bilateral pulmonary alveolar infiltrates and pleural effusions consistent with congestive heart failure. Carotid atherosclerotic vascular calcification. No acute bony abnormality.  IMPRESSION: 1. Interim placement of left IJ dialysis catheter, its tip is in the region of the superior vena cava. 2. Left chest tube noted. No pneumothorax. Remainder of the lines and tubes are in stable position. 3. Congestive heart failure with pulmonary edema and bilateral pleural effusions. No significant improvement from prior study. 4. Carotid vascular disease.   Electronically Signed   By: Maisie Fushomas  Register   On: 02/10/2014 15:47     Assessment/Plan: S/P Procedure(s) (LRB): CORONARY ARTERY BYPASS GRAFTING TIMES FOUR USING LEFT INTERNAL MAMMARY ARTERY TO LAD,  SAPHENOUS VEIN GRAFTS TO OM1, OM2, AND PDA (N/A) MITRAL VALVE (MV) REPLACEMENT (N/A) VDRF- Continue vent management per CCM. May ultimately need trach.  CV- Atrial flutter with controlled rates. Gtts: Milrinone, Levophed. Co-ox 54.6  Nutrition- post-pyloric TFs.  AKI- Cr trending down with CVVH.  Per renal  Leukocytosis, no further fever. On Vanc/Zosyn D#3 for presumed pneumonia. Respiratory cx's show normal oropharyngeal flora, pleural fluid cultures no growth. WBC trending down. U/a with many bacteria. Postop anemia- H/H generally stable.  DM- sugars a little better today.  Continue Lantus and monitor. Hypokalemia- supplement K+. L CT for effusion- minimal drainage.  Continue for now.    Stormy Sabol H 02/12/2014 7:46 AM

## 2014-02-12 NOTE — Progress Notes (Signed)
18 Days Post-Op Procedure(s) (LRB): CORONARY ARTERY BYPASS GRAFTING TIMES FOUR USING LEFT INTERNAL MAMMARY ARTERY TO LAD, SAPHENOUS VEIN GRAFTS TO OM1, OM2, AND PDA (N/A) MITRAL VALVE (MV) REPLACEMENT (N/A) Subjective: The patient is become more awake and alert after her BUN-creatinine have improved on CRT She is currently in a sinus rhythm with stable blood pressure Mixed venous saturation today 55 percent on low-dose milrinone, medium dose norepinephrine Minimal drainage from left chest tube but leave in place because of buying overload Chest x-ray shows edema with probable small right effusion    Afebrile Sinus rhythm 84 Blood pressure 120/70 by A-line  Hemodynamic parameters for last 24 hours: CVP:  [13 mmHg-31 mmHg] 14 mmHg  Intake/Output from previous day: 07/29 0701 - 07/30 0700 In: 2796.6 [I.V.:656.6; NG/GT:1640; IV Piggyback:500] Out: 2815 [Urine:390; Stool:725; Chest Tube:40] Intake/Output this shift: Total I/O In: 134.6 [I.V.:54.6; NG/GT:80] Out: 206 [Urine:20; Other:186]  Alert and responsive to commands Sternal incision well-healed Extremities edematous but warm Abdomen soft, rectal tube in place No cardiac murmur Diminished breath sounds Lab Results:  Recent Labs  02/11/14 0426 02/12/14 0400  WBC 19.7* 16.5*  HGB 9.3* 8.8*  HCT 28.3* 27.9*  PLT 396 320   BMET:  Recent Labs  02/11/14 1500 02/12/14 0400  NA 133* 136*  K 3.1* 3.4*  CL 88* 95*  CO2 26 27  GLUCOSE 217* 143*  BUN 96* 67*  CREATININE 2.82* 2.07*  CALCIUM 8.2* 8.3*    PT/INR: No results found for this basename: LABPROT, INR,  in the last 72 hours ABG    Component Value Date/Time   PHART 7.503* 02/09/2014 0453   HCO3 30.3* 02/09/2014 0453   TCO2 22 02/10/2014 1810   ACIDBASEDEF 0.1 01/30/2014 0339   O2SAT 54.6 02/12/2014 0406   CBG (last 3)   Recent Labs  02/12/14 0008 02/12/14 0404 02/12/14 0743  GLUCAP 195* 145* 163*    Assessment/Plan: S/P Procedure(s) (LRB): CORONARY  ARTERY BYPASS GRAFTING TIMES FOUR USING LEFT INTERNAL MAMMARY ARTERY TO LAD, SAPHENOUS VEIN GRAFTS TO OM1, OM2, AND PDA (N/A) MITRAL VALVE (MV) REPLACEMENT (N/A)  Postoperative acute renal failure now on CVVH Poorly controlled diabetes, on split doses Lantus plus sliding scale Probable pneumonia-gram-negative rods and gram-positive cocci on smear with current broad-spectrum antibiotics and improved white count Acute MI with severe MI and pulmonary hypertension treated with emergency CABG and mitral valve replacement. Postop echocardiogram shows EF 55%  After volume is removed with CVVH hope to wean and extubate from ventilator   LOS: 18 days    VAN TRIGT III,PETER 02/12/2014

## 2014-02-13 ENCOUNTER — Inpatient Hospital Stay (HOSPITAL_COMMUNITY): Payer: Medicare Other

## 2014-02-13 LAB — CULTURE, ROUTINE-ABSCESS

## 2014-02-13 LAB — PHOSPHORUS: Phosphorus: 3.2 mg/dL (ref 2.3–4.6)

## 2014-02-13 LAB — RENAL FUNCTION PANEL
ALBUMIN: 2.3 g/dL — AB (ref 3.5–5.2)
ANION GAP: 12 (ref 5–15)
BUN: 37 mg/dL — AB (ref 6–23)
CHLORIDE: 101 meq/L (ref 96–112)
CO2: 25 mEq/L (ref 19–32)
Calcium: 8 mg/dL — ABNORMAL LOW (ref 8.4–10.5)
Creatinine, Ser: 1.51 mg/dL — ABNORMAL HIGH (ref 0.50–1.10)
GFR calc Af Amer: 38 mL/min — ABNORMAL LOW (ref >90)
GFR calc non Af Amer: 33 mL/min — ABNORMAL LOW (ref >90)
GLUCOSE: 149 mg/dL — AB (ref 70–99)
Phosphorus: 2.5 mg/dL (ref 2.3–4.6)
Potassium: 3.6 mEq/L — ABNORMAL LOW (ref 3.7–5.3)
Sodium: 138 mEq/L (ref 137–147)

## 2014-02-13 LAB — CARBOXYHEMOGLOBIN
Carboxyhemoglobin: 1.5 % (ref 0.5–1.5)
Methemoglobin: 0.6 % (ref 0.0–1.5)
O2 Saturation: 65.2 %
Total hemoglobin: 7.5 g/dL — ABNORMAL LOW (ref 12.0–16.0)

## 2014-02-13 LAB — POCT I-STAT 3, ART BLOOD GAS (G3+)
Acid-Base Excess: 3 mmol/L — ABNORMAL HIGH (ref 0.0–2.0)
Bicarbonate: 27.4 mEq/L — ABNORMAL HIGH (ref 20.0–24.0)
O2 Saturation: 98 %
Patient temperature: 97.2
TCO2: 29 mmol/L (ref 0–100)
pCO2 arterial: 38.1 mmHg (ref 35.0–45.0)
pH, Arterial: 7.462 — ABNORMAL HIGH (ref 7.350–7.450)
pO2, Arterial: 90 mmHg (ref 80.0–100.0)

## 2014-02-13 LAB — COMPREHENSIVE METABOLIC PANEL
ALT: 16 U/L (ref 0–35)
AST: 30 U/L (ref 0–37)
Albumin: 2.3 g/dL — ABNORMAL LOW (ref 3.5–5.2)
Alkaline Phosphatase: 93 U/L (ref 39–117)
Anion gap: 15 (ref 5–15)
BUN: 47 mg/dL — ABNORMAL HIGH (ref 6–23)
CO2: 24 mEq/L (ref 19–32)
Calcium: 7.9 mg/dL — ABNORMAL LOW (ref 8.4–10.5)
Chloride: 97 mEq/L (ref 96–112)
Creatinine, Ser: 1.67 mg/dL — ABNORMAL HIGH (ref 0.50–1.10)
GFR calc Af Amer: 34 mL/min — ABNORMAL LOW (ref >90)
GFR calc non Af Amer: 29 mL/min — ABNORMAL LOW (ref >90)
Glucose, Bld: 114 mg/dL — ABNORMAL HIGH (ref 70–99)
Potassium: 3.7 mEq/L (ref 3.7–5.3)
Sodium: 136 mEq/L — ABNORMAL LOW (ref 137–147)
Total Bilirubin: 0.7 mg/dL (ref 0.3–1.2)
Total Protein: 6.5 g/dL (ref 6.0–8.3)

## 2014-02-13 LAB — CBC
HCT: 25.2 % — ABNORMAL LOW (ref 36.0–46.0)
Hemoglobin: 8 g/dL — ABNORMAL LOW (ref 12.0–15.0)
MCH: 30.3 pg (ref 26.0–34.0)
MCHC: 31.7 g/dL (ref 30.0–36.0)
MCV: 95.5 fL (ref 78.0–100.0)
Platelets: 261 10*3/uL (ref 150–400)
RBC: 2.64 MIL/uL — ABNORMAL LOW (ref 3.87–5.11)
RDW: 18.5 % — ABNORMAL HIGH (ref 11.5–15.5)
WBC: 11.7 10*3/uL — ABNORMAL HIGH (ref 4.0–10.5)

## 2014-02-13 LAB — GLUCOSE, CAPILLARY
Glucose-Capillary: 112 mg/dL — ABNORMAL HIGH (ref 70–99)
Glucose-Capillary: 143 mg/dL — ABNORMAL HIGH (ref 70–99)
Glucose-Capillary: 128 mg/dL — ABNORMAL HIGH (ref 70–99)
Glucose-Capillary: 158 mg/dL — ABNORMAL HIGH (ref 70–99)
Glucose-Capillary: 169 mg/dL — ABNORMAL HIGH (ref 70–99)
Glucose-Capillary: 159 mg/dL — ABNORMAL HIGH (ref 70–99)

## 2014-02-13 LAB — VANCOMYCIN, RANDOM: Vancomycin Rm: 12.8 ug/mL

## 2014-02-13 LAB — ALBUMIN: Albumin: 2.3 g/dL — ABNORMAL LOW (ref 3.5–5.2)

## 2014-02-13 MED ORDER — MORPHINE SULFATE 2 MG/ML IJ SOLN
2.0000 mg | Freq: Four times a day (QID) | INTRAMUSCULAR | Status: DC | PRN
  Administered 2014-02-13 – 2014-02-16 (×7): 2 mg via INTRAVENOUS
  Filled 2014-02-13 (×7): qty 1

## 2014-02-13 NOTE — Plan of Care (Signed)
Problem: Phase II Progression Outcomes Goal: Time pt extubated/weaned off vent Outcome: Completed/Met Date Met:  02/13/14 Extubated again today

## 2014-02-13 NOTE — Progress Notes (Signed)
RN Note: Attempted IS and Flutter valve with patient. She has poor effort on both devices. RN will continue to encourage use and assist patient with breathing devices.

## 2014-02-13 NOTE — Progress Notes (Signed)
19 Days Post-Op  Subjective: Extubated.  Objective: Vital signs in last 24 hours: Temp:  [95.9 F (35.5 C)-99 F (37.2 C)] 98.1 F (36.7 C) (07/31 1400) Pulse Rate:  [76-83] 79 (07/31 1400) Resp:  [17-27] 25 (07/31 1400) BP: (74-125)/(43-72) 98/47 mmHg (07/31 1400) SpO2:  [97 %-100 %] 99 % (07/31 1400) Arterial Line BP: (90-146)/(39-105) 111/51 mmHg (07/31 1430) FiO2 (%):  [40 %] 40 % (07/31 0800) Weight:  [231 lb 11.3 oz (105.1 kg)] 231 lb 11.3 oz (105.1 kg) (07/31 0500) Last BM Date: 02/13/14  Intake/Output from previous day: 07/30 0701 - 07/31 0700 In: 1909.1 [I.V.:609.1; NG/GT:980; IV Piggyback:200] Out: 4057 [Urine:215; Stool:700; Chest Tube:80] Intake/Output this shift: Total I/O In: 563.9 [I.V.:163.9; NG/GT:350; IV Piggyback:50] Out: 1425 [Urine:35; Other:1370; Chest Tube:20]  Left chest drain intact, output 80 cc's serous fluid past 24hrs, 20cc output so far today.  Lab Results:   Recent Labs  02/12/14 0400 02/13/14 0440  WBC 16.5* 11.7*  HGB 8.8* 8.0*  HCT 27.9* 25.2*  PLT 320 261   BMET  Recent Labs  02/12/14 1806 02/13/14 0440  NA 135* 136*  K 4.0 3.7  CL 95* 97  CO2 25 24  GLUCOSE 162* 114*  BUN 54* 47*  CREATININE 1.78* 1.67*  CALCIUM 8.0* 7.9*   PT/INR No results found for this basename: LABPROT, INR,  in the last 72 hours ABG No results found for this basename: PHART, PCO2, PO2, HCO3,  in the last 72 hours  Studies/Results: US Renal Port  02/11/2014   CLINICAL DATA:  Acute renal failure. History of diabetes and hypertension.  EXAM: RENAL/URINARY TRACT ULTRASOUND COMPLETE  COMPARISON:  Chest CT 02/09/2014  FINDINGS: Right Kidney:  Length: 10.7 cm. There is mild renal cortical thinning and increased echogenicity. The renal sinus fat is prominent. There is no hydronephrosis or focal cortical abnormality.  Left Kidney:  Length: 10.4 cm. Echogenicity within normal limits. No mass or hydronephrosis visualized.  Bladder:  Decompressed by Foley  catheter and not visualized.  Ascites and a right pleural effusion are noted.  IMPRESSION: No evidence of hydronephrosis. The right kidney demonstrates mild cortical thinning and increased echogenicity. Bladder is not visualized, appearing decompressed by Foley catheter.   Electronically Signed   By: Roxy Horseman M.D.   On: 02/11/2014 16:25   Dg Chest Port 1 View  02/13/2014   CLINICAL DATA:  Status post CABG  EXAM: PORTABLE CHEST - 1 VIEW  COMPARISON:  Portable chest of February 12, 2014  FINDINGS: The pulmonary interstitial markings have increased bilaterally. The hemidiaphragms are obscured though this is stable. The cardiac silhouette remains enlarged. The endotracheal tube tip lies 4.5 cm above the crotch of the carina. The right-sided PICC line in the left internal jugular venous catheter remain in appropriate position. The pigtail catheter on the left is stable. No pneumothorax is demonstrated.  IMPRESSION: Worsening pulmonary interstitial edema bilaterally may reflect the patient's volume status or cardiac function. Pleural fluid on the right and possibly on the left is present.   Electronically Signed   By: David  [place]   On: 02/13/2014 07:36   Dg Chest Port 1 View  02/12/2014   CLINICAL DATA:  Status post mitral valve replacement  EXAM: PORTABLE CHEST - 1 VIEW  COMPARISON:  Portable chest x-[place] of February 07, 2014  FINDINGS: The lungs are well-expanded. There has been interval improvement in the appearance of the left lung base. The pigtail catheter is unchanged in position. On the right there is  density over the lower hemi thorax which may reflect pleural fluid layering posteriorly. This is more conspicuous today but could reflect the fact that today's exam was performed in the semi upright position versus yesterday's upright position.  The cardiac silhouette is normal in size. The mitral valve ring is visible as are coronary artery graft markers. The pulmonary vascularity is prominent centrally but is  more distinct today. The endotracheal tube tip lies approximately 3.4 cm above the crotch of the carina. The feeding tube tip projects off the inferior margin of the study. The left internal jugular venous catheter tip lies in the midportion of the SVC.  IMPRESSION: There has been slight interval improvement in the appearance of the pulmonary interstitium on the left. There remain findings of mild CHF. There is a right-sided pleural effusion.   Electronically Signed   By: David  [place]   On: 02/12/2014 07:50    Anti-infectives: Anti-infectives   Start     Dose/Rate Route Frequency Ordered Stop   02/11/14 2300  vancomycin (VANCOCIN) IVPB 1000 mg/200 mL premix  Status:  Discontinued     1,000 mg 200 mL/hr over 60 Minutes Intravenous Every 24 hours 02/11/14 1013 02/12/14 0909   02/10/14 1000  cefTAZidime (FORTAZ) 1 g in dextrose 5 % 50 mL IVPB  Status:  Discontinued     1 g 100 mL/hr over 30 Minutes Intravenous Every 24 hours 02/09/14 1119 02/10/14 0810   02/10/14 1000  piperacillin-tazobactam (ZOSYN) IVPB 3.375 g  Status:  Discontinued     3.375 g 12.5 mL/hr over 240 Minutes Intravenous Every 12 hours 02/10/14 0809 02/10/14 0830   02/10/14 0930  fluconazole (DIFLUCAN) IVPB 100 mg  Status:  Discontinued     100 mg 50 mL/hr over 60 Minutes Intravenous Every 24 hours 02/10/14 0809 02/11/14 0841   02/10/14 0930  piperacillin-tazobactam (ZOSYN) IVPB 2.25 g     2.25 g 100 mL/hr over 30 Minutes Intravenous Every 6 hours 02/10/14 0832     02/09/14 2030  vancomycin (VANCOCIN) 1,500 mg in sodium chloride 0.9 % 500 mL IVPB  Status:  Discontinued     1,500 mg 250 mL/hr over 120 Minutes Intravenous Every 48 hours 02/09/14 1914 02/11/14 1013   02/07/14 0100  cefTAZidime (FORTAZ) 1 g in dextrose 5 % 50 mL IVPB  Status:  Discontinued     1 g 100 mL/hr over 30 Minutes Intravenous Every 12 hours 02/06/14 1502 02/09/14 1119   01/28/14 1400  cefTAZidime (FORTAZ) 1 g in dextrose 5 % 50 mL IVPB  Status:   Discontinued     1 g 100 mL/hr over 30 Minutes Intravenous 3 times per day 01/28/14 0950 02/06/14 1502   01/27/14 0045  vancomycin (VANCOCIN) IVPB 750 mg/150 ml premix     750 mg 150 mL/hr over 60 Minutes Intravenous Every 12 hours 01/26/14 1242 01/27/14 1356   01/26/14 1215  vancomycin (VANCOCIN) IVPB 1000 mg/200 mL premix     1,000 mg 200 mL/hr over 60 Minutes Intravenous  Once 01/26/14 1015 01/26/14 1442   01/26/14 1030  cefUROXime (ZINACEF) 1.5 g in dextrose 5 % 50 mL IVPB     1.5 g 100 mL/hr over 30 Minutes Intravenous Every 12 hours 01/26/14 1015 01/27/14 2323   01/25/14 2100  vancomycin (VANCOCIN) 1,250 mg in sodium chloride 0.9 % 250 mL IVPB  Status:  Discontinued     1,250 mg 166.7 mL/hr over 90 Minutes Intravenous To Surgery 01/25/14 2057 01/25/14 2142   01/25/14 2100  cefUROXime (ZINACEF) 1.5 g in dextrose 5 % 50 mL IVPB  Status:  Discontinued     1.5 g 100 mL/hr over 30 Minutes Intravenous To Surgery 01/25/14 2057 01/25/14 2142   01/25/14 2100  cefUROXime (ZINACEF) 750 mg in dextrose 5 % 50 mL IVPB  Status:  Discontinued     750 mg 100 mL/hr over 30 Minutes Intravenous To Surgery 01/25/14 2057 01/25/14 2142      Assessment/Plan: s/p Procedure(s): CORONARY ARTERY BYPASS GRAFTING TIMES FOUR USING LEFT INTERNAL MAMMARY ARTERY TO LAD, SAPHENOUS VEIN GRAFTS TO OM1, OM2, AND PDA (N/A) MITRAL VALVE (MV) REPLACEMENT (N/A) S/p left chest drain 7/27 for persistent effusion-continue to follow plans as per Dr. Donata ClayVan Trigt  LOS: 19 days    Brayton [name], Brooke Glacken 02/13/2014

## 2014-02-13 NOTE — Progress Notes (Signed)
PULMONARY / CRITICAL CARE MEDICINE  Name: [name] MRN: [id number] DOB: 1941/01/14    ADMISSION DATE:  01/25/2014 CONSULTATION DATE:  02/13/2014  REFERRING MD :  Tyrone SageGerhardt  CHIEF COMPLAINT:  Hypoxemia  INITIAL PRESENTATION: 73 yo admitted 7/12 with STEMI. Taken to cath lab but unable to perform PCI due to significant RCA disease. Taken to OR emergently for CABG x 4.  Returned to SICU mechanically ventilated.   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 Reintubated 7/27 FOB >>> no overt airway obstruction noted 7/27 CT chest >>> large L effusion, moderate R effusion 7/27 CT guided pleural catheter placed by IR. 1300 cc of serosanguinous fluid  7/28 Renal consult >>> CRRT initiated  INTERVAL HISTORY:  Doing great on SBT this morning.  VITAL SIGNS: Temp:  [95.9 F (35.5 C)-100 F (37.8 C)] 97.7 F (36.5 C) (07/31 0800) Pulse Rate:  [76-84] 81 (07/31 0800) Resp:  [17-28] 23 (07/31 0800) BP: (92-127)/(43-60) 95/44 mmHg (07/31 0800) SpO2:  [96 %-100 %] 99 % (07/31 0800) Arterial Line BP: (89-146)/(43-71) 120/58 mmHg (07/31 0830) FiO2 (%):  [40 %] 40 % (07/31 0800) Weight:  [105.1 kg (231 lb 11.3 oz)] 105.1 kg (231 lb 11.3 oz) (07/31 0500)  HEMODYNAMICS: CVP:  [6 mmHg-30 mmHg] 15 mmHg  VENTILATOR SETTINGS: Vent Mode:  [-] PSV;CPAP FiO2 (%):  [40 %] 40 % Set Rate:  [15 bmp] 15 bmp Vt Set:  [500 mL] 500 mL PEEP:  [5 cmH20] 5 cmH20 Pressure Support:  [5 cmH20] 5 cmH20 Plateau Pressure:  [14 cmH20-22 cmH20] 18 cmH20  INTAKE / OUTPUT: Intake/Output     07/30 0701 - 07/31 0700 07/31 0701 - 08/01 0700   I.V. (mL/kg) 609.1 (5.8) 42.4 (0.4)   Other 120    NG/GT 980 110   IV Piggyback 200    Total Intake(mL/kg) 1909.1 (18.2) 152.4 (1.5)   Urine (mL/kg/hr) 215 (0.1) 10 (0)   Other 3062 (1.2) 292 (1.4)   Stool 700 (0.3)    Chest Tube 80 (0) 20 (0.1)   Total Output 4057 322   Net -2147.9 -169.6         PHYSICAL EXAMINATION: General: Appears comfortable Neuro: Following commands HEENT: OETT, Panda tube Cardiovascular: Regular, no murmurs Lungs: Few rhonchi, strong cough Abdomen: Soft, non tender, bowel sounds present Ext: Bilateral symmetric pitting edema  LABS: CBC  Recent Labs Lab 02/11/14 0426 02/12/14 0400 02/13/14 0440  WBC 19.7* 16.5* 11.7*  HGB 9.3* 8.8* 8.0*  HCT 28.3* 27.9* 25.2*  PLT 396 320 261   Coag's  Recent Labs Lab 02/08/14 0730 02/08/14 1600 02/09/14 0330  APTT 115* 88* 113*   BMET  Recent Labs Lab 02/12/14 0400 02/12/14 1806 02/13/14 0440  NA 136* 135* 136*  K 3.4* 4.0 3.7  CL 95* 95* 97  CO2 27 25 24   BUN 67* 54* 47*  CREATININE 2.07* 1.78* 1.67*  GLUCOSE 143* 162* 114*   Electrolytes  Recent Labs Lab 02/07/14 0400  02/12/14 0400 02/12/14 1806 02/13/14 0440  CALCIUM 8.7  < > 8.3* 8.0* 7.9*  MG 2.1  --   --   --   --   PHOS 4.2  --  3.5 3.1 3.2  < > = values in this interval not displayed.  Sepsis Markers No results found for this basename: LATICACIDVEN, PROCALCITON, O2SATVEN,  in the last 168 hours  ABG  Recent Labs Lab 02/07/14 0437 02/08/14 0500 02/09/14 0453  PHART 7.516* 7.499* 7.503*  PCO2ART 42.7 45.3* 38.5  PO2ART 87.5 84.0 114.0*   Liver Enzymes  Recent Labs Lab 02/11/14 0426 02/12/14 0400 02/12/14 1806 02/13/14 0440  AST 30 24  --  30  ALT 24 19  --  16  ALKPHOS 128* 142*  --  93  BILITOT 1.2 1.0  --  0.7  ALBUMIN 2.6* 2.5* 2.3* 2.3*  2.3*   Cardiac Enzymes No results found for this basename: TROPONINI, PROBNP,  in the last 168 hours  Glucose  Recent Labs Lab 02/12/14 1159 02/12/14 1534 02/12/14 1956 02/12/14 2332 02/13/14 0340 02/13/14 0737  GLUCAP 185* 139* 178* 180* 112* 143*   IMAGING:  US Renal Port  02/11/2014   CLINICAL DATA:  Acute renal  failure. History of diabetes and hypertension.  EXAM: RENAL/URINARY TRACT ULTRASOUND COMPLETE  COMPARISON:  Chest CT 02/09/2014  FINDINGS: Right Kidney:  Length: 10.7 cm. There is mild renal cortical thinning and increased echogenicity. The renal sinus fat is prominent. There is no hydronephrosis or focal cortical abnormality.  Left Kidney:  Length: 10.4 cm. Echogenicity within normal limits. No mass or hydronephrosis visualized.  Bladder:  Decompressed by Foley catheter and not visualized.  Ascites and a right pleural effusion are noted.  IMPRESSION: No evidence of hydronephrosis. The right kidney demonstrates mild cortical thinning and increased echogenicity. Bladder is not visualized, appearing decompressed by Foley catheter.   Electronically Signed   By: Roxy Horseman M.D.   On: 02/11/2014 16:25   Dg Chest Port 1 View  02/13/2014   CLINICAL DATA:  Status post CABG  EXAM: PORTABLE CHEST - 1 VIEW  COMPARISON:  Portable chest of February 12, 2014  FINDINGS: The pulmonary interstitial markings have increased bilaterally. The hemidiaphragms are obscured though this is stable. The cardiac silhouette remains enlarged. The endotracheal tube tip lies 4.5 cm above the crotch of the carina. The right-sided PICC line in the left internal jugular venous catheter remain in appropriate position. The pigtail catheter on the left is stable. No pneumothorax is demonstrated.  IMPRESSION: Worsening pulmonary interstitial edema bilaterally may reflect the patient's volume status or cardiac function. Pleural fluid on the right and possibly on the left is present.   Electronically Signed   By: David  [place]   On: 02/13/2014 07:36   Dg Chest Port 1 View  02/12/2014   CLINICAL DATA:  Status post mitral valve replacement  EXAM: PORTABLE CHEST - 1 VIEW  COMPARISON:  Portable chest x-ray of February 07, 2014  FINDINGS: The lungs are well-expanded. There has been interval improvement in the appearance of the left lung base. The pigtail  catheter is unchanged in position. On the right there is density over the lower hemi thorax which may reflect pleural fluid layering posteriorly. This is more conspicuous today but could reflect the fact that today's exam was performed in the semi upright position versus yesterday's upright position.  The cardiac silhouette is normal in size. The mitral valve ring is visible as are coronary artery graft markers. The pulmonary vascularity is prominent centrally but is more distinct today. The endotracheal tube tip lies approximately 3.4 cm above the crotch of the carina. The feeding tube tip projects off the inferior margin of the study. The left internal jugular venous catheter tip lies in the midportion of the SVC.  IMPRESSION: There has been slight interval improvement in the appearance of the pulmonary interstitium on the left. There remain findings of mild CHF. There is a right-sided pleural effusion.   Electronically Signed   By: David  [place]   On: 02/12/2014 07:50   ASSESSMENT / PLAN:  Acute hypoxemic respiratory failure, extubated 7/31 Goal SpO2>92 Extubate Supplemental oxygen Flutter valve / incentive spirometry  Large L pleural effusion - s/p pleural cath drainage 7/27 Chest tube management per TCTS  AKI Acute on chronic diastolic heart failure Pulmonary edema Stay balance + 7L  --> + 4.5L overnight Renal following CRRT Continue Levophed / Milrinone  Suspected HCAP Continue Zosyn  Px Protonix Heparin [place]  I have personally obtained history, examined patient, evaluated and interpreted laboratory and imaging results, reviewed medical records, formulated assessment / plan and placed orders.  CRITICAL CARE:  The patient is critically ill with multiple organ systems failure and requires high complexity decision making for assessment and support, frequent evaluation and titration of therapies, application of advanced monitoring technologies and extensive interpretation of multiple  databases. Critical Care Time devoted to patient care services described in this note is 35 minutes.   Lonia Farber, MD Pulmonary and Critical Care Medicine [facility] Pager: [phone]  02/13/2014, 8:58 AM

## 2014-02-13 NOTE — Progress Notes (Signed)
19 Days Post-Op Procedure(s) (LRB): CORONARY ARTERY BYPASS GRAFTING TIMES FOUR USING LEFT INTERNAL MAMMARY ARTERY TO LAD, SAPHENOUS VEIN GRAFTS TO OM1, OM2, AND PDA (N/A) MITRAL VALVE (MV) REPLACEMENT (N/A) Subjective: emerg CABG MVR- tissue Postop LV fx stable- low dose Mil and Norepi titrated for BP ATN on CVVH day2- removing 100 cc/hr Extubated On vanco-Zosyn for prob pneumonia- fever and WBC better NSR Postop anemia- Hb 8.0 Very weak-obese DM controlled with Lantus-SSI TF via post pyloric Panda Objective: Vital signs in last 24 hours: Temp:  [95.9 F (35.5 C)-100 F (37.8 C)] 97.7 F (36.5 C) (07/31 0800) Pulse Rate:  [76-84] 81 (07/31 0800) Cardiac Rhythm:  [-] Atrial flutter (07/31 0800) Resp:  [17-28] 23 (07/31 0800) BP: (92-127)/(43-60) 102/48 mmHg (07/31 0915) SpO2:  [96 %-100 %] 99 % (07/31 0800) Arterial Line BP: (89-146)/(43-71) 94/58 mmHg (07/31 0900) FiO2 (%):  [40 %] 40 % (07/31 0800) Weight:  [231 lb 11.3 oz (105.1 kg)] 231 lb 11.3 oz (105.1 kg) (07/31 0500)  Hemodynamic parameters for last 24 hours: CVP:  [6 mmHg-30 mmHg] 15 mmHg  Intake/Output from previous day: 07/30 0701 - 07/31 0700 In: 1909.1 [I.V.:609.1; NG/GT:980; IV Piggyback:200] Out: 4057 [Urine:215; Stool:700; Chest Tube:80] Intake/Output this shift: Total I/O In: 152.4 [I.V.:42.4; NG/GT:110] Out: 514 [Urine:10; Other:484; Chest Tube:20]  Extubated Edematous extrem warm Incisions clean,dry  Lab Results:  Recent Labs  02/12/14 0400 02/13/14 0440  WBC 16.5* 11.7*  HGB 8.8* 8.0*  HCT 27.9* 25.2*  PLT 320 261   BMET:  Recent Labs  02/12/14 1806 02/13/14 0440  NA 135* 136*  K 4.0 3.7  CL 95* 97  CO2 25 24  GLUCOSE 162* 114*  BUN 54* 47*  CREATININE 1.78* 1.67*  CALCIUM 8.0* 7.9*    PT/INR: No results found for this basename: LABPROT, INR,  in the last 72 hours ABG    Component Value Date/Time   PHART 7.503* 02/09/2014 0453   HCO3 30.3* 02/09/2014 0453   TCO2 22  02/10/2014 1810   ACIDBASEDEF 0.1 01/30/2014 0339   O2SAT 65.2 02/13/2014 0500   CBG (last 3)   Recent Labs  02/12/14 2332 02/13/14 0340 02/13/14 0737  GLUCAP 180* 112* 143*    Assessment/Plan: S/P Procedure(s) (LRB): CORONARY ARTERY BYPASS GRAFTING TIMES FOUR USING LEFT INTERNAL MAMMARY ARTERY TO LAD, SAPHENOUS VEIN GRAFTS TO OM1, OM2, AND PDA (N/A) MITRAL VALVE (MV) REPLACEMENT (N/A) Will need PRBCs for Hb< 8.0 Cont CVVH- CXR wet  LOS: 19 days    VAN TRIGT III,PETER 02/13/2014

## 2014-02-13 NOTE — Procedures (Signed)
Extubation Procedure Note  Patient Details:   Name: [name] DOB: 08-28-1940 MRN: [id number]   Airway Documentation:  Airway (Active)  Secured at (cm) 23 cm 02/13/2014  7:44 AM  Measured From Lips 02/13/2014  7:44 AM  Secured Location Center 02/13/2014  7:44 AM  Secured By [employer] 02/13/2014  7:44 AM  Tube Holder Repositioned Yes 02/13/2014  7:44 AM  Cuff Pressure (cm H2O) 25 cm H2O 02/13/2014  7:44 AM  Site Condition Cool;Dry 02/13/2014  7:44 AM    Evaluation  O2 sats: stable throughout Complications: No apparent complications Patient did tolerate procedure well. Bilateral Breath Sounds: Fine crackles;Diminished Suctioning: Oral;Airway Yes  Patient extubated to 4L [place].  Vitals stable throughout.  Sats 99%.  Patient able to mouth her name.  RT will continue to monitor.  Ledell Noss N 02/13/2014, 9:03 AM

## 2014-02-13 NOTE — Progress Notes (Signed)
[facility] ROUNDING NOTE   Subjective:   Interval History: making significant progress  Weaning in process  Objective:  Vital signs in last 24 hours:  Temp:  [95.9 F (35.5 C)-99 F (37.2 C)] 97.6 F (36.4 C) (07/31 1315) Pulse Rate:  [76-83] 79 (07/31 1315) Resp:  [17-27] 27 (07/31 1315) BP: (74-125)/(43-72) 88/72 mmHg (07/31 1000) SpO2:  [97 %-100 %] 97 % (07/31 1315) Arterial Line BP: (94-146)/(39-105) 94/39 mmHg (07/31 1315) FiO2 (%):  [40 %] 40 % (07/31 0800) Weight:  [105.1 kg (231 lb 11.3 oz)] 105.1 kg (231 lb 11.3 oz) (07/31 0500)  Weight change: -1.5 kg (-3 lb 4.9 oz) Filed Weights   02/12/14 0500 02/13/14 0000 02/13/14 0500  Weight: 106.6 kg (235 lb 0.2 oz) 105.1 kg (231 lb 11.3 oz) 105.1 kg (231 lb 11.3 oz)    Intake/Output: I/O last 3 completed shifts: In: 3208.3 [I.V.:928.3; Other:120; NG/GT:1660; IV Piggyback:500] Out: 5750 [Urine:355; ZOXWR:6045; Stool:1200; Chest Tube:80]   Intake/Output this shift:  Total I/O In: 423.2 [I.V.:143.2; NG/GT:230; IV Piggyback:50] Out: 1092 [Urine:35; Other:1037; Chest Tube:20]  Gen alert and following commands,  CVS: RRR,  Lungs: Mostly clear, somewhat diminished in bases  Abdomen: soft, non-tender; bowel sounds normal;  Extremities: +LE edema  Wound: Clean     Basic Metabolic Panel:  Recent Labs Lab 02/07/14 0400  02/11/14 0426 02/11/14 1500 02/12/14 0400 02/12/14 1806 02/13/14 0440  NA 135*  < > 135* 133* 136* 135* 136*  K 3.7  < > 3.1* 3.1* 3.4* 4.0 3.7  CL 86*  < > 87* 88* 95* 95* 97  CO2 36*  < > 27 26 27 25 24   GLUCOSE 142*  < > 193* 217* 143* 162* 114*  BUN 78*  < > 118* 96* 67* 54* 47*  CREATININE 1.69*  < > 3.53* 2.82* 2.07* 1.78* 1.67*  CALCIUM 8.7  < > 8.5 8.2* 8.3* 8.0* 7.9*  MG 2.1  --   --   --   --   --   --   PHOS 4.2  --   --   --  3.5 3.1 3.2  < > = values in this interval not displayed.  Liver Function Tests:  Recent Labs Lab 02/08/14 0330 02/10/14 0455  02/11/14 0426 02/12/14 0400 02/12/14 1806 02/13/14 0440  AST 40* 43* 30 24  --  30  ALT 27 28 24 19   --  16  ALKPHOS 165* 152* 128* 142*  --  93  BILITOT 1.8* 1.4* 1.2 1.0  --  0.7  PROT 7.1 6.6 6.5 6.8  --  6.5  ALBUMIN 2.9* 2.7* 2.6* 2.5* 2.3* 2.3*  2.3*   No results found for this basename: LIPASE, AMYLASE,  in the last 168 hours No results found for this basename: AMMONIA,  in the last 168 hours  CBC:  Recent Labs Lab 02/10/14 0455 02/10/14 1810 02/11/14 0426 02/12/14 0400 02/13/14 0440  WBC 16.7* 23.7* 19.7* 16.5* 11.7*  HGB 9.4* 9.1*  14.3 9.3* 8.8* 8.0*  HCT 29.0* 28.0*  42.0 28.3* 27.9* 25.2*  MCV 92.7 91.8 91.9 93.6 95.5  PLT 373 410* 396 320 261    Cardiac Enzymes: No results found for this basename: CKTOTAL, CKMB, CKMBINDEX, TROPONINI,  in the last 168 hours  BNP: No components found with this basename: POCBNP,   CBG:  Recent Labs Lab 02/12/14 1956 02/12/14 2332 02/13/14 0340 02/13/14 0737 02/13/14 1159  GLUCAP 178* 180* 112* 143* 128*    Microbiology: Results  for orders placed during the hospital encounter of 01/25/14  MRSA PCR SCREENING     Status: None   Collection Time    01/26/14 11:10 AM      Result Value Ref Range Status   MRSA by PCR NEGATIVE  NEGATIVE Final   Comment:            The GeneXpert MRSA Assay (FDA     approved for NASAL specimens     only), is one component of a     comprehensive MRSA colonization     surveillance program. It is not     intended to diagnose MRSA     infection nor to guide or     monitor treatment for     MRSA infections.  CULTURE, RESPIRATORY (NON-EXPECTORATED)     Status: None   Collection Time    01/27/14  9:53 AM      Result Value Ref Range Status   Specimen Description TRACHEAL ASPIRATE   Final   Special Requests Normal   Final   Gram Stain     Final   Value: FEW WBC PRESENT,BOTH PMN AND MONONUCLEAR     NO SQUAMOUS EPITHELIAL CELLS SEEN     NO ORGANISMS SEEN     Performed at [facility]   Culture     Final   Value: NO GROWTH 2 DAYS     Performed at [facility]   Report Status 01/29/2014 FINAL   Final  URINE CULTURE     Status: None   Collection Time    02/02/14 12:19 PM      Result Value Ref Range Status   Specimen Description URINE, CATHETERIZED   Final   Special Requests Normal   Final   Culture  Setup Time     Final   Value: 02/02/2014 19:42     Performed at [facility] Count     Final   Value: NO GROWTH     Performed at [facility]   Culture     Final   Value: NO GROWTH     Performed at [facility]   Report Status 02/03/2014 FINAL   Final  CLOSTRIDIUM DIFFICILE BY PCR     Status: None   Collection Time    02/05/14  5:18 PM      Result Value Ref Range Status   C difficile by pcr NEGATIVE  NEGATIVE Final  AFB CULTURE WITH SMEAR     Status: None   Collection Time    02/06/14  2:45 PM      Result Value Ref Range Status   Specimen Description FLUID PLEURAL   Final   Special Requests NONE   Final   Acid Fast Smear     Final   Value: NO ACID FAST BACILLI SEEN     Performed at [facility]   Culture     Final   Value: CULTURE WILL BE EXAMINED FOR 6 WEEKS BEFORE ISSUING A FINAL REPORT     Performed at [facility]   Report Status PENDING   Incomplete  BODY FLUID CULTURE     Status: None   Collection Time    02/06/14  2:45 PM      Result Value Ref Range Status   Specimen Description PLEURAL FLUID   Final   Special Requests NONE   Final   Gram Stain     Final   Value: NO  WBC SEEN     NO ORGANISMS SEEN     Performed at [facility]   Culture     Final   Value: NO GROWTH 3 DAYS     Performed at [facility]   Report Status 02/10/2014 FINAL   Final  FUNGUS CULTURE W SMEAR     Status: None   Collection Time    02/06/14  2:46 PM      Result Value Ref Range Status   Specimen Description FLUID PLEURAL   Final   Special Requests NONE   Final   Fungal Smear     Final    Value: NO YEAST OR FUNGAL ELEMENTS SEEN     Performed at [facility]   Culture     Final   Value: CULTURE IN PROGRESS FOR FOUR WEEKS     Performed at [facility]   Report Status PENDING   Incomplete  CULTURE, RESPIRATORY (NON-EXPECTORATED)     Status: None   Collection Time    02/09/14  8:27 AM      Result Value Ref Range Status   Specimen Description TRACHEAL ASPIRATE   Final   Special Requests NONE   Final   Gram Stain     Final   Value: FEW WBC PRESENT,BOTH PMN AND MONONUCLEAR     RARE SQUAMOUS EPITHELIAL CELLS PRESENT     MODERATE GRAM NEGATIVE RODS     FEW GRAM POSITIVE RODS     RARE GRAM POSITIVE COCCI IN PAIRS   Culture     Final   Value: Non-Pathogenic Oropharyngeal-type Flora Isolated.     Performed at [facility]   Report Status 02/11/2014 FINAL   Final  CULTURE, ROUTINE-ABSCESS     Status: None   Collection Time    02/09/14  4:08 PM      Result Value Ref Range Status   Specimen Description ABSCESS JP DRAINAGE   Final   Special Requests NONE   Final   Gram Stain     Final   Value: FEW WBC PRESENT,BOTH PMN AND MONONUCLEAR     NO SQUAMOUS EPITHELIAL CELLS SEEN     NO ORGANISMS SEEN     Performed at [facility]   Culture     Final   Value: MULTIPLE ORGANISMS PRESENT, NONE PREDOMINANT     Note: NO STAPHYLOCOCCUS AUREUS ISOLATED NO GROUP A STREP (S.PYOGENES) ISOLATED     Performed at [facility]   Report Status 02/13/2014 FINAL   Final    Coagulation Studies: No results found for this basename: LABPROT, INR,  in the last 72 hours  Urinalysis:  Recent Labs  02/11/14 1140  COLORURINE YELLOW  LABSPEC 1.020  PHURINE 6.0  GLUCOSEU NEGATIVE  HGBUR MODERATE*  BILIRUBINUR NEGATIVE  KETONESUR NEGATIVE  PROTEINUR 100*  UROBILINOGEN 0.2  NITRITE NEGATIVE  LEUKOCYTESUR MODERATE*      Imaging: US Renal Port  02/11/2014   CLINICAL DATA:  Acute renal failure. History of diabetes and hypertension.  EXAM: RENAL/URINARY  TRACT ULTRASOUND COMPLETE  COMPARISON:  Chest CT 02/09/2014  FINDINGS: Right Kidney:  Length: 10.7 cm. There is mild renal cortical thinning and increased echogenicity. The renal sinus fat is prominent. There is no hydronephrosis or focal cortical abnormality.  Left Kidney:  Length: 10.4 cm. Echogenicity within normal limits. No mass or hydronephrosis visualized.  Bladder:  Decompressed by Foley catheter and not visualized.  Ascites and a right pleural effusion are noted.  IMPRESSION: No evidence of hydronephrosis. The right kidney demonstrates mild cortical thinning and increased echogenicity. Bladder is not visualized, appearing decompressed by Foley catheter.   Electronically Signed   By: Roxy HorsemanBill  Veazey M.D.   On: 02/11/2014 16:25   Dg Chest Port 1 View  02/13/2014   CLINICAL DATA:  Status post CABG  EXAM: PORTABLE CHEST - 1 VIEW  COMPARISON:  Portable chest of February 12, 2014  FINDINGS: The pulmonary interstitial markings have increased bilaterally. The hemidiaphragms are obscured though this is stable. The cardiac silhouette remains enlarged. The endotracheal tube tip lies 4.5 cm above the crotch of the carina. The right-sided PICC line in the left internal jugular venous catheter remain in appropriate position. The pigtail catheter on the left is stable. No pneumothorax is demonstrated.  IMPRESSION: Worsening pulmonary interstitial edema bilaterally may reflect the patient's volume status or cardiac function. Pleural fluid on the right and possibly on the left is present.   Electronically Signed   By: David  [place]   On: 02/13/2014 07:36   Dg Chest Port 1 View  02/12/2014   CLINICAL DATA:  Status post mitral valve replacement  EXAM: PORTABLE CHEST - 1 VIEW  COMPARISON:  Portable chest x-ray of February 07, 2014  FINDINGS: The lungs are well-expanded. There has been interval improvement in the appearance of the left lung base. The pigtail catheter is unchanged in position. On the right there is density over the  lower hemi thorax which may reflect pleural fluid layering posteriorly. This is more conspicuous today but could reflect the fact that today's exam was performed in the semi upright position versus yesterday's upright position.  The cardiac silhouette is normal in size. The mitral valve ring is visible as are coronary artery graft markers. The pulmonary vascularity is prominent centrally but is more distinct today. The endotracheal tube tip lies approximately 3.4 cm above the crotch of the carina. The feeding tube tip projects off the inferior margin of the study. The left internal jugular venous catheter tip lies in the midportion of the SVC.  IMPRESSION: There has been slight interval improvement in the appearance of the pulmonary interstitium on the left. There remain findings of mild CHF. There is a right-sided pleural effusion.   Electronically Signed   By: David  [place]   On: 02/12/2014 07:50     Medications:   . sodium chloride Stopped (02/01/14 0830)  . sodium chloride 250 mL (02/13/14 1300)  . milrinone 0.25 mcg/kg/min (02/13/14 1300)  . norepinephrine (LEVOPHED) Adult infusion 3 mcg/min (02/13/14 1300)  . dialysis replacement fluid (prismasate) 300 mL/hr at 02/12/14 2210  . dialysis replacement fluid (prismasate) 200 mL/hr at 02/13/14 0420  . dialysate (PRISMASATE) 2,000 mL/hr at 02/13/14 1228   . antiseptic oral rinse  7 mL Mouth Rinse Q2H  . aspirin  324 mg Per Tube Daily  . bisacodyl  10 mg Oral Daily   Or  . bisacodyl  10 mg Rectal Daily  . chlorhexidine  15 mL Mouth Rinse BID  . feeding supplement (NEPRO CARB STEADY)  1,000 mL Oral Q24H  . feeding supplement (PRO-STAT SUGAR FREE 64)  30 mL Per Tube 5 X Daily  . heparin subcutaneous  5,000 Units Subcutaneous 3 times per day  . insulin aspart  0-24 Units Subcutaneous 6 times per day  . insulin glargine  25 Units Subcutaneous BID  . levalbuterol  1.25 mg Nebulization 6 times per day  . levothyroxine  37.5 mcg Intravenous QAC  breakfast  .  pantoprazole (PROTONIX) IV  40 mg Intravenous Q24H  . piperacillin-tazobactam (ZOSYN)  IV  2.25 g Intravenous Q6H  . sodium chloride  10-40 mL Intracatheter Q12H   acetaminophen, heparin, morphine injection, ondansetron (ZOFRAN) IV, oxyCODONE, sodium chloride, sodium chloride  Assessment/ Plan:  Acute kidney injury appears to have declined with hypotension and shock. Renal u/s showed ascites no hydronephrosis Electrolytes stable K 3.7 and sodium 136  Acid / base Bicarbonate 24  HTN/Volume tolerating removal of fluid with CRRT Vanco trough 12.8  7/31  Tolerating CRRT   LOS: 19 Callum Wolf W @TODAY @1 :45 PM

## 2014-02-13 NOTE — Progress Notes (Signed)
Extubated earlier today  BP 92/42  Pulse 80  Temp(Src) 97.7 F (36.5 C) (Oral)  Resp 26  Ht 5\' 7"  (1.702 m)  Wt 231 lb 11.3 oz (105.1 kg)  BMI 36.28 kg/m2  SpO2 99%   Intake/Output Summary (Last 24 hours) at 02/13/14 1833 Last data filed at 02/13/14 1800  Gross per 24 hour  Intake 2065.5 ml  Output   4653 ml  Net -2587.5 ml    On milrinone and norepi  Tolerating CVVH

## 2014-02-13 NOTE — Progress Notes (Signed)
ANTIBIOTIC CONSULT NOTE - FOLLOW UP  Pharmacy Consult for  zosyn Indication: PNA  No Known Allergies  Patient Measurements: Height: 5\' 7"  (170.2 cm) Weight: 231 lb 11.3 oz (105.1 kg) IBW/kg (Calculated) : 61.6  Vital Signs: Temp: 97.6 F (36.4 C) (07/31 0900) BP: 102/48 mmHg (07/31 0915) Pulse Rate: 79 (07/31 0900) Intake/Output from previous day: 07/30 0701 - 07/31 0700 In: 1909.1 [I.V.:609.1; NG/GT:980; IV Piggyback:200] Out: 4057 [Urine:215; Stool:700; Chest Tube:80] Intake/Output from this shift: Total I/O In: 163.1 [I.V.:53.1; NG/GT:110] Out: 524 [Urine:20; Other:484; Chest Tube:20]  Labs:  Recent Labs  02/11/14 0426  02/12/14 0400 02/12/14 1806 02/13/14 0440  WBC 19.7*  --  16.5*  --  11.7*  HGB 9.3*  --  8.8*  --  8.0*  PLT 396  --  320  --  261  CREATININE 3.53*  < > 2.07* 1.78* 1.67*  < > = values in this interval not displayed. Estimated Creatinine Clearance: 36.9 ml/min (by C-G formula based on Cr of 1.67).  Recent Labs  02/11/14 0426  VANCORANDOM 16.3     Microbiology: Recent Results (from the past 720 hour(s))  MRSA PCR SCREENING     Status: None   Collection Time    01/26/14 11:10 AM      Result Value Ref Range Status   MRSA by PCR NEGATIVE  NEGATIVE Final   Comment:            The GeneXpert MRSA Assay (FDA     approved for NASAL specimens     only), is one component of a     comprehensive MRSA colonization     surveillance program. It is not     intended to diagnose MRSA     infection nor to guide or     monitor treatment for     MRSA infections.  CULTURE, RESPIRATORY (NON-EXPECTORATED)     Status: None   Collection Time    01/27/14  9:53 AM      Result Value Ref Range Status   Specimen Description TRACHEAL ASPIRATE   Final   Special Requests Normal   Final   Gram Stain     Final   Value: FEW WBC PRESENT,BOTH PMN AND MONONUCLEAR     NO SQUAMOUS EPITHELIAL CELLS SEEN     NO ORGANISMS SEEN     Performed at [facility]   Culture     Final   Value: NO GROWTH 2 DAYS     Performed at [facility]   Report Status 01/29/2014 FINAL   Final  URINE CULTURE     Status: None   Collection Time    02/02/14 12:19 PM      Result Value Ref Range Status   Specimen Description URINE, CATHETERIZED   Final   Special Requests Normal   Final   Culture  Setup Time     Final   Value: 02/02/2014 19:42     Performed at [facility] Count     Final   Value: NO GROWTH     Performed at [facility]   Culture     Final   Value: NO GROWTH     Performed at [facility]   Report Status 02/03/2014 FINAL   Final  CLOSTRIDIUM DIFFICILE BY PCR     Status: None   Collection Time    02/05/14  5:18 PM      Result Value Ref Range Status  C difficile by pcr NEGATIVE  NEGATIVE Final  AFB CULTURE WITH SMEAR     Status: None   Collection Time    02/06/14  2:45 PM      Result Value Ref Range Status   Specimen Description FLUID PLEURAL   Final   Special Requests NONE   Final   Acid Fast Smear     Final   Value: NO ACID FAST BACILLI SEEN     Performed at [facility]   Culture     Final   Value: CULTURE WILL BE EXAMINED FOR 6 WEEKS BEFORE ISSUING A FINAL REPORT     Performed at [facility]   Report Status PENDING   Incomplete  BODY FLUID CULTURE     Status: None   Collection Time    02/06/14  2:45 PM      Result Value Ref Range Status   Specimen Description PLEURAL FLUID   Final   Special Requests NONE   Final   Gram Stain     Final   Value: NO WBC SEEN     NO ORGANISMS SEEN     Performed at [facility]   Culture     Final   Value: NO GROWTH 3 DAYS     Performed at [facility]   Report Status 02/10/2014 FINAL   Final  FUNGUS CULTURE W SMEAR     Status: None   Collection Time    02/06/14  2:46 PM      Result Value Ref Range Status   Specimen Description FLUID PLEURAL   Final   Special Requests NONE   Final   Fungal Smear     Final   Value: NO  YEAST OR FUNGAL ELEMENTS SEEN     Performed at [facility]   Culture     Final   Value: CULTURE IN PROGRESS FOR FOUR WEEKS     Performed at [facility]   Report Status PENDING   Incomplete  CULTURE, RESPIRATORY (NON-EXPECTORATED)     Status: None   Collection Time    02/09/14  8:27 AM      Result Value Ref Range Status   Specimen Description TRACHEAL ASPIRATE   Final   Special Requests NONE   Final   Gram Stain     Final   Value: FEW WBC PRESENT,BOTH PMN AND MONONUCLEAR     RARE SQUAMOUS EPITHELIAL CELLS PRESENT     MODERATE GRAM NEGATIVE RODS     FEW GRAM POSITIVE RODS     RARE GRAM POSITIVE COCCI IN PAIRS   Culture     Final   Value: Non-Pathogenic Oropharyngeal-type Flora Isolated.     Performed at [facility]   Report Status 02/11/2014 FINAL   Final  CULTURE, ROUTINE-ABSCESS     Status: None   Collection Time    02/09/14  4:08 PM      Result Value Ref Range Status   Specimen Description ABSCESS JP DRAINAGE   Final   Special Requests NONE   Final   Gram Stain     Final   Value: FEW WBC PRESENT,BOTH PMN AND MONONUCLEAR     NO SQUAMOUS EPITHELIAL CELLS SEEN     NO ORGANISMS SEEN     Performed at [facility]   Culture     Final   Value: MULTIPLE ORGANISMS PRESENT, NONE PREDOMINANT     Note: NO STAPHYLOCOCCUS AUREUS ISOLATED NO GROUP A  STREP (S.PYOGENES) ISOLATED     [facility]   Report Status 02/13/2014 FINAL   Final    Anti-infectives   Start     Dose/Rate Route Frequency Ordered Stop   02/11/14 2300  vancomycin (VANCOCIN) IVPB 1000 mg/200 mL premix  Status:  Discontinued     1,000 mg 200 mL/hr over 60 Minutes Intravenous Every 24 hours 02/11/14 1013 02/12/14 0909   02/10/14 1000  cefTAZidime (FORTAZ) 1 g in dextrose 5 % 50 mL IVPB  Status:  Discontinued     1 g 100 mL/hr over 30 Minutes Intravenous Every 24 hours 02/09/14 1119 02/10/14 0810   02/10/14 1000  piperacillin-tazobactam (ZOSYN) IVPB 3.375 g   Status:  Discontinued     3.375 g 12.5 mL/hr over 240 Minutes Intravenous Every 12 hours 02/10/14 0809 02/10/14 0830   02/10/14 0930  fluconazole (DIFLUCAN) IVPB 100 mg  Status:  Discontinued     100 mg 50 mL/hr over 60 Minutes Intravenous Every 24 hours 02/10/14 0809 02/11/14 0841   02/10/14 0930  piperacillin-tazobactam (ZOSYN) IVPB 2.25 g     2.25 g 100 mL/hr over 30 Minutes Intravenous Every 6 hours 02/10/14 0832     02/09/14 2030  vancomycin (VANCOCIN) 1,500 mg in sodium chloride 0.9 % 500 mL IVPB  Status:  Discontinued     1,500 mg 250 mL/hr over 120 Minutes Intravenous Every 48 hours 02/09/14 1914 02/11/14 1013   02/07/14 0100  cefTAZidime (FORTAZ) 1 g in dextrose 5 % 50 mL IVPB  Status:  Discontinued     1 g 100 mL/hr over 30 Minutes Intravenous Every 12 hours 02/06/14 1502 02/09/14 1119   01/28/14 1400  cefTAZidime (FORTAZ) 1 g in dextrose 5 % 50 mL IVPB  Status:  Discontinued     1 g 100 mL/hr over 30 Minutes Intravenous 3 times per day 01/28/14 0950 02/06/14 1502   01/27/14 0045  vancomycin (VANCOCIN) IVPB 750 mg/150 ml premix     750 mg 150 mL/hr over 60 Minutes Intravenous Every 12 hours 01/26/14 1242 01/27/14 1356   01/26/14 1215  vancomycin (VANCOCIN) IVPB 1000 mg/200 mL premix     1,000 mg 200 mL/hr over 60 Minutes Intravenous  Once 01/26/14 1015 01/26/14 1442   01/26/14 1030  cefUROXime (ZINACEF) 1.5 g in dextrose 5 % 50 mL IVPB     1.5 g 100 mL/hr over 30 Minutes Intravenous Every 12 hours 01/26/14 1015 01/27/14 2323   01/25/14 2100  vancomycin (VANCOCIN) 1,250 mg in sodium chloride 0.9 % 250 mL IVPB  Status:  Discontinued     1,250 mg 166.7 mL/hr over 90 Minutes Intravenous To Surgery 01/25/14 2057 01/25/14 2142   01/25/14 2100  cefUROXime (ZINACEF) 1.5 g in dextrose 5 % 50 mL IVPB  Status:  Discontinued     1.5 g 100 mL/hr over 30 Minutes Intravenous To Surgery 01/25/14 2057 01/25/14 2142   01/25/14 2100  cefUROXime (ZINACEF) 750 mg in dextrose 5 % 50 mL IVPB   Status:  Discontinued     750 mg 100 mL/hr over 30 Minutes Intravenous To Surgery 01/25/14 2057 01/25/14 2142      Assessment: 73 yo female 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  Patient also noted on CRRT. WBC= 11.7, tmax= 100, SCr= 1.67 , CrCl ~ 35.  Elita QuickFortaz 7/15>>7/27 Vanc 7/27>> 7/30 Pip/Tazo 7/28>>  7/16 TA>>neg FINAL 7/20 Urine - NEG FINAL 7/13 MRSA - NEG 7/23 cdiff>> neg FINAL 7/24 pleural  fluid>> ngtd 7/24 fungal>>ngtd 7/27 resp>> non-path flora 7/27 abscess>> multiple org  Goal of Therapy:  Vancomycin trough level 15-20 mcg/ml  Plan:  -No zosyn changes needed -Will follow renal function, cultures and clinical progress  Harland German, Pharm D 02/13/2014 10:02 AM

## 2014-02-14 ENCOUNTER — Inpatient Hospital Stay (HOSPITAL_COMMUNITY): Payer: Medicare Other

## 2014-02-14 DIAGNOSIS — Z951 Presence of aortocoronary bypass graft: Secondary | ICD-10-CM

## 2014-02-14 LAB — RENAL FUNCTION PANEL
ANION GAP: 9 (ref 5–15)
Albumin: 2.3 g/dL — ABNORMAL LOW (ref 3.5–5.2)
BUN: 30 mg/dL — ABNORMAL HIGH (ref 6–23)
CALCIUM: 7.9 mg/dL — AB (ref 8.4–10.5)
CO2: 26 meq/L (ref 19–32)
Chloride: 100 mEq/L (ref 96–112)
Creatinine, Ser: 1.32 mg/dL — ABNORMAL HIGH (ref 0.50–1.10)
GFR calc non Af Amer: 39 mL/min — ABNORMAL LOW (ref >90)
GFR, EST AFRICAN AMERICAN: 45 mL/min — AB (ref >90)
GLUCOSE: 141 mg/dL — AB (ref 70–99)
PHOSPHORUS: 2.3 mg/dL (ref 2.3–4.6)
POTASSIUM: 3.5 meq/L — AB (ref 3.7–5.3)
Sodium: 135 mEq/L — ABNORMAL LOW (ref 137–147)

## 2014-02-14 LAB — COMPREHENSIVE METABOLIC PANEL
ALT: 13 U/L (ref 0–35)
AST: 18 U/L (ref 0–37)
Albumin: 2.3 g/dL — ABNORMAL LOW (ref 3.5–5.2)
Alkaline Phosphatase: 83 U/L (ref 39–117)
Anion gap: 14 (ref 5–15)
BUN: 31 mg/dL — ABNORMAL HIGH (ref 6–23)
CO2: 26 mEq/L (ref 19–32)
Calcium: 7.7 mg/dL — ABNORMAL LOW (ref 8.4–10.5)
Chloride: 93 mEq/L — ABNORMAL LOW (ref 96–112)
Creatinine, Ser: 1.29 mg/dL — ABNORMAL HIGH (ref 0.50–1.10)
GFR calc Af Amer: 46 mL/min — ABNORMAL LOW (ref >90)
GFR calc non Af Amer: 40 mL/min — ABNORMAL LOW (ref >90)
Glucose, Bld: 223 mg/dL — ABNORMAL HIGH (ref 70–99)
Potassium: 3.3 mEq/L — ABNORMAL LOW (ref 3.7–5.3)
Sodium: 133 mEq/L — ABNORMAL LOW (ref 137–147)
Total Bilirubin: 0.7 mg/dL (ref 0.3–1.2)
Total Protein: 7 g/dL (ref 6.0–8.3)

## 2014-02-14 LAB — GLUCOSE, CAPILLARY
Glucose-Capillary: 190 mg/dL — ABNORMAL HIGH (ref 70–99)
Glucose-Capillary: 108 mg/dL — ABNORMAL HIGH (ref 70–99)
Glucose-Capillary: 137 mg/dL — ABNORMAL HIGH (ref 70–99)
Glucose-Capillary: 153 mg/dL — ABNORMAL HIGH (ref 70–99)
Glucose-Capillary: 115 mg/dL — ABNORMAL HIGH (ref 70–99)

## 2014-02-14 LAB — CBC
HCT: 24.5 % — ABNORMAL LOW (ref 36.0–46.0)
Hemoglobin: 7.6 g/dL — ABNORMAL LOW (ref 12.0–15.0)
MCH: 30 pg (ref 26.0–34.0)
MCHC: 31 g/dL (ref 30.0–36.0)
MCV: 96.8 fL (ref 78.0–100.0)
Platelets: 209 10*3/uL (ref 150–400)
RBC: 2.53 MIL/uL — ABNORMAL LOW (ref 3.87–5.11)
RDW: 18 % — ABNORMAL HIGH (ref 11.5–15.5)
WBC: 9.2 10*3/uL (ref 4.0–10.5)

## 2014-02-14 LAB — CARBOXYHEMOGLOBIN
Carboxyhemoglobin: 1.8 % — ABNORMAL HIGH (ref 0.5–1.5)
Methemoglobin: 0.7 % (ref 0.0–1.5)
O2 Saturation: 59.4 %
Total hemoglobin: 7.9 g/dL — ABNORMAL LOW (ref 12.0–16.0)

## 2014-02-14 LAB — PHOSPHORUS: Phosphorus: 2.3 mg/dL (ref 2.3–4.6)

## 2014-02-14 MED ORDER — PANTOPRAZOLE SODIUM 40 MG PO PACK
40.0000 mg | PACK | Freq: Every day | ORAL | Status: DC
  Administered 2014-02-14 – 2014-03-06 (×21): 40 mg
  Filled 2014-02-14 (×21): qty 20

## 2014-02-14 MED ORDER — LEVALBUTEROL HCL 0.63 MG/3ML IN NEBU
INHALATION_SOLUTION | RESPIRATORY_TRACT | Status: AC
  Administered 2014-02-14: 0.63 mg
  Filled 2014-02-14: qty 3

## 2014-02-14 MED ORDER — LEVALBUTEROL HCL 1.25 MG/0.5ML IN NEBU
0.6300 mg | INHALATION_SOLUTION | Freq: Four times a day (QID) | RESPIRATORY_TRACT | Status: DC
  Administered 2014-02-14 – 2014-02-16 (×7): 0.63 mg via RESPIRATORY_TRACT
  Filled 2014-02-14 (×12): qty 0.26

## 2014-02-14 NOTE — Progress Notes (Signed)
No change in mental status  BP 98/53  Pulse 86  Temp(Src) 97.1 F (36.2 C) (Oral)  Resp 18  Ht 5\' 7"  (1.702 m)  Wt 223 lb 8.7 oz (101.4 kg)  BMI 35.00 kg/m2  SpO2 100%   Intake/Output Summary (Last 24 hours) at 02/14/14 1711 Last data filed at 02/14/14 1600  Gross per 24 hour  Intake 1958.6 ml  Output   3950 ml  Net -1991.4 ml   Still taking volume off with CVVHD  Continue present care

## 2014-02-14 NOTE — Progress Notes (Signed)
Subjective: Patient extubated, opens eyes.   Objective: Physical Exam: BP 100/49  Pulse 81  Temp(Src) 97.3 F (36.3 C) (Oral)  Resp 19  Ht 5\' 7"  (1.702 m)  Wt 223 lb 8.7 oz (101.4 kg)  BMI 35.00 kg/m2  SpO2 99%  General: Opens eyes to vocal stimulus, nonverbal, on HD Chest: CTA anteriorly, left CT intact, 24 hr output 50 cc, Cx no growth  Labs: CBC  Recent Labs  02/13/14 0440 02/14/14 0352  WBC 11.7* 9.2  HGB 8.0* 7.6*  HCT 25.2* 24.5*  PLT 261 209   BMET  Recent Labs  02/13/14 1600 02/14/14 0352  NA 138 133*  K 3.6* 3.3*  CL 101 93*  CO2 25 26  GLUCOSE 149* 223*  BUN 37* 31*  CREATININE 1.51* 1.29*  CALCIUM 8.0* 7.7*   LFT  Recent Labs  02/14/14 0352  PROT 7.0  ALBUMIN 2.3*  AST 18  ALT 13  ALKPHOS 83  BILITOT 0.7   PT/INR No results found for this basename: LABPROT, INR,  in the last 72 hours   Studies/Results: Dg Chest Port 1 View  02/14/2014   CLINICAL DATA:  Mitral valve replacement.  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 Cardiac silhouette remains enlarged. Left chest pigtail catheter remains in place, stable to slightly lower in the chest than on the prior study. Left jugular central venous catheter remains in place with tip overlying the mid SVC. Right PICC remains in place with tip near the cavoatrial junction. Endotracheal tube has been removed. Enteric tube courses into the left upper abdomen with tip not imaged. There is persistent pulmonary vascular congestion with hazy bilateral lung opacities, slightly improved from the prior study. Small right and possibly small left pleural effusions are suspected. No pneumothorax is identified.  IMPRESSION: 1. Interval removal of endotracheal tube. Other lines and tubes as above. 2. Pulmonary vascular congestion and hazy bilateral lung opacities, compatible with pulmonary edema and slightly improved from  prior. Small right and possibly small left pleural effusions.   Electronically Signed   By: Sebastian Ache   On: 02/14/2014 07:26   Dg Chest Port 1 View  02/13/2014   CLINICAL DATA:  Status post CABG  EXAM: PORTABLE CHEST - 1 VIEW  COMPARISON:  Portable chest of February 12, 2014  FINDINGS: The pulmonary interstitial markings have increased bilaterally. The hemidiaphragms are obscured though this is stable. The cardiac silhouette remains enlarged. The endotracheal tube tip lies 4.5 cm above the crotch of the carina. The right-sided PICC line in the left internal jugular venous catheter remain in appropriate position. The pigtail catheter on the left is stable. No pneumothorax is demonstrated.  IMPRESSION: Worsening pulmonary interstitial edema bilaterally may reflect the patient's volume status or cardiac function. Pleural fluid on the right and possibly on the left is present.   Electronically Signed   By: David  [place]   On: 02/13/2014 07:36    Assessment/Plan: s/p Procedure(s):  CORONARY ARTERY BYPASS GRAFTING TIMES FOUR USING LEFT INTERNAL MAMMARY ARTERY TO LAD, SAPHENOUS VEIN GRAFTS TO OM1, OM2, AND PDA (N/A) MITRAL VALVE (MV) REPLACEMENT (N/A) Persistent large left pleural effusion s/p left chest drain placed 02/09/14, low output, CXR with improvement Plans per CTS, chest tube to remain in for now.    LOS: 20 days    Cloretta Ned 02/14/2014 10:16 AM

## 2014-02-14 NOTE — Progress Notes (Signed)
[facility] ROUNDING NOTE   Subjective:   Interval History: Stable on CRRT  Continues to tolerate removal of fluid  Objective:  Vital signs in last 24 hours:  Temp:  [95.4 F (35.2 C)-98.3 F (36.8 C)] 97.4 F (36.3 C) (08/01 1130) Pulse Rate:  [79-82] 82 (08/01 1130) Resp:  [17-28] 20 (08/01 1130) BP: (86-110)/(42-57) 101/51 mmHg (08/01 1100) SpO2:  [97 %-100 %] 99 % (08/01 1130) Arterial Line BP: (82-140)/(39-80) 97/48 mmHg (08/01 1130) Weight:  [101.4 kg (223 lb 8.7 oz)] 101.4 kg (223 lb 8.7 oz) (08/01 0500)  Weight change: -3.7 kg (-8 lb 2.5 oz) Filed Weights   02/13/14 0000 02/13/14 0500 02/14/14 0500  Weight: 105.1 kg (231 lb 11.3 oz) 105.1 kg (231 lb 11.3 oz) 101.4 kg (223 lb 8.7 oz)    Intake/Output: I/O last 3 completed shifts: In: 3075.6 [I.V.:805.6; Other:120; NG/GT:1750; IV Piggyback:400] Out: 6392 [Urine:180; Other:5232; Stool:900; Chest Tube:80]   Intake/Output this shift:  Total I/O In: 363 [I.V.:93; Other:30; NG/GT:190; IV Piggyback:50] Out: 784 [Other:484; Stool:300]  Gen alert and following commands,  CVS: RRR,  Lungs: Mostly clear, somewhat diminished in bases  Abdomen: soft, non-tender; bowel sounds normal;  Extremities: +LE edema  Wound: Clean and dry, no erythema or drainage    Basic Metabolic Panel:  Recent Labs Lab 02/12/14 0400 02/12/14 1806 02/13/14 0440 02/13/14 1600 02/14/14 0352  NA 136* 135* 136* 138 133*  K 3.4* 4.0 3.7 3.6* 3.3*  CL 95* 95* 97 101 93*  CO2 27 25 24 25 26   GLUCOSE 143* 162* 114* 149* 223*  BUN 67* 54* 47* 37* 31*  CREATININE 2.07* 1.78* 1.67* 1.51* 1.29*  CALCIUM 8.3* 8.0* 7.9* 8.0* 7.7*  PHOS 3.5 3.1 3.2 2.5 2.3    Liver Function Tests:  Recent Labs Lab 02/10/14 0455 02/11/14 0426 02/12/14 0400 02/12/14 1806 02/13/14 0440 02/13/14 1600 02/14/14 0352  AST 43* 30 24  --  30  --  18  ALT 28 24 19   --  16  --  13  ALKPHOS 152* 128* 142*  --  93  --  83  BILITOT 1.4* 1.2 1.0  --   0.7  --  0.7  PROT 6.6 6.5 6.8  --  6.5  --  7.0  ALBUMIN 2.7* 2.6* 2.5* 2.3* 2.3*  2.3* 2.3* 2.3*   No results found for this basename: LIPASE, AMYLASE,  in the last 168 hours No results found for this basename: AMMONIA,  in the last 168 hours  CBC:  Recent Labs Lab 02/10/14 1810 02/11/14 0426 02/12/14 0400 02/13/14 0440 02/14/14 0352  WBC 23.7* 19.7* 16.5* 11.7* 9.2  HGB 9.1*  14.3 9.3* 8.8* 8.0* 7.6*  HCT 28.0*  42.0 28.3* 27.9* 25.2* 24.5*  MCV 91.8 91.9 93.6 95.5 96.8  PLT 410* 396 320 261 209    Cardiac Enzymes: No results found for this basename: CKTOTAL, CKMB, CKMBINDEX, TROPONINI,  in the last 168 hours  BNP: No components found with this basename: POCBNP,   CBG:  Recent Labs Lab 02/13/14 1956 02/13/14 2317 02/14/14 0354 02/14/14 0800 02/14/14 1122  GLUCAP 169* 159* 190* 108* 137*    Microbiology: Results for orders placed during the hospital encounter of 01/25/14  MRSA PCR SCREENING     Status: None   Collection Time    01/26/14 11:10 AM      Result Value Ref Range Status   MRSA by PCR NEGATIVE  NEGATIVE Final   Comment:  The GeneXpert MRSA Assay (FDA     approved for NASAL specimens     only), is one component of a     comprehensive MRSA colonization     surveillance program. It is not     intended to diagnose MRSA     infection nor to guide or     monitor treatment for     MRSA infections.  CULTURE, RESPIRATORY (NON-EXPECTORATED)     Status: None   Collection Time    01/27/14  9:53 AM      Result Value Ref Range Status   Specimen Description TRACHEAL ASPIRATE   Final   Special Requests Normal   Final   Gram Stain     Final   Value: FEW WBC PRESENT,BOTH PMN AND MONONUCLEAR     NO SQUAMOUS EPITHELIAL CELLS SEEN     NO ORGANISMS SEEN     Performed at [facility]   Culture     Final   Value: NO GROWTH 2 DAYS     Performed at [facility]   Report Status 01/29/2014 FINAL   Final  URINE CULTURE     Status:  None   Collection Time    02/02/14 12:19 PM      Result Value Ref Range Status   Specimen Description URINE, CATHETERIZED   Final   Special Requests Normal   Final   Culture  Setup Time     Final   Value: 02/02/2014 19:42     Performed at [facility] Count     Final   Value: NO GROWTH     Performed at [facility]   Culture     Final   Value: NO GROWTH     Performed at [facility]   Report Status 02/03/2014 FINAL   Final  CLOSTRIDIUM DIFFICILE BY PCR     Status: None   Collection Time    02/05/14  5:18 PM      Result Value Ref Range Status   C difficile by pcr NEGATIVE  NEGATIVE Final  AFB CULTURE WITH SMEAR     Status: None   Collection Time    02/06/14  2:45 PM      Result Value Ref Range Status   Specimen Description FLUID PLEURAL   Final   Special Requests NONE   Final   Acid Fast Smear     Final   Value: NO ACID FAST BACILLI SEEN     Performed at [facility]   Culture     Final   Value: CULTURE WILL BE EXAMINED FOR 6 WEEKS BEFORE ISSUING A FINAL REPORT     Performed at [facility]   Report Status PENDING   Incomplete  BODY FLUID CULTURE     Status: None   Collection Time    02/06/14  2:45 PM      Result Value Ref Range Status   Specimen Description PLEURAL FLUID   Final   Special Requests NONE   Final   Gram Stain     Final   Value: NO WBC SEEN     NO ORGANISMS SEEN     Performed at [facility]   Culture     Final   Value: NO GROWTH 3 DAYS     Performed at [facility]   Report Status 02/10/2014 FINAL   Final  FUNGUS CULTURE W SMEAR     Status: None  Collection Time    02/06/14  2:46 PM      Result Value Ref Range Status   Specimen Description FLUID PLEURAL   Final   Special Requests NONE   Final   Fungal Smear     Final   Value: NO YEAST OR FUNGAL ELEMENTS SEEN     Performed at [facility]   Culture     Final   Value: CULTURE IN PROGRESS FOR FOUR WEEKS     Performed at  [facility]   Report Status PENDING   Incomplete  CULTURE, RESPIRATORY (NON-EXPECTORATED)     Status: None   Collection Time    02/09/14  8:27 AM      Result Value Ref Range Status   Specimen Description TRACHEAL ASPIRATE   Final   Special Requests NONE   Final   Gram Stain     Final   Value: FEW WBC PRESENT,BOTH PMN AND MONONUCLEAR     RARE SQUAMOUS EPITHELIAL CELLS PRESENT     MODERATE GRAM NEGATIVE RODS     FEW GRAM POSITIVE RODS     RARE GRAM POSITIVE COCCI IN PAIRS   Culture     Final   Value: Non-Pathogenic Oropharyngeal-type Flora Isolated.     Performed at [facility]   Report Status 02/11/2014 FINAL   Final  CULTURE, ROUTINE-ABSCESS     Status: None   Collection Time    02/09/14  4:08 PM      Result Value Ref Range Status   Specimen Description ABSCESS JP DRAINAGE   Final   Special Requests NONE   Final   Gram Stain     Final   Value: FEW WBC PRESENT,BOTH PMN AND MONONUCLEAR     NO SQUAMOUS EPITHELIAL CELLS SEEN     NO ORGANISMS SEEN     Performed at [facility]   Culture     Final   Value: MULTIPLE ORGANISMS PRESENT, NONE PREDOMINANT     Note: NO STAPHYLOCOCCUS AUREUS ISOLATED NO GROUP A STREP (S.PYOGENES) ISOLATED     Performed at [facility]   Report Status 02/13/2014 FINAL   Final    Coagulation Studies: No results found for this basename: LABPROT, INR,  in the last 72 hours  Urinalysis: No results found for this basename: COLORURINE, APPERANCEUR, LABSPEC, PHURINE, GLUCOSEU, HGBUR, BILIRUBINUR, KETONESUR, PROTEINUR, UROBILINOGEN, NITRITE, LEUKOCYTESUR,  in the last 72 hours    Imaging: Dg Chest Port 1 View  02/14/2014   CLINICAL DATA:  Mitral valve replacement.  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 Cardiac silhouette remains enlarged. Left chest pigtail catheter remains in place, stable to slightly lower in the chest than  on the prior study. Left jugular central venous catheter remains in place with tip overlying the mid SVC. Right PICC remains in place with tip near the cavoatrial junction. Endotracheal tube has been removed. Enteric tube courses into the left upper abdomen with tip not imaged. There is persistent pulmonary vascular congestion with hazy bilateral lung opacities, slightly improved from the prior study. Small right and possibly small left pleural effusions are suspected. No pneumothorax is identified.  IMPRESSION: 1. Interval removal of endotracheal tube. Other lines and tubes as above. 2. Pulmonary vascular congestion and hazy bilateral lung opacities, compatible with pulmonary edema and slightly improved from prior. Small right and possibly small left pleural effusions.   Electronically  Signed   By: Sebastian AcheAllen  Grady   On: 02/14/2014 07:26   Dg Chest Port 1 View  02/13/2014   CLINICAL DATA:  Status post CABG  EXAM: PORTABLE CHEST - 1 VIEW  COMPARISON:  Portable chest of February 12, 2014  FINDINGS: The pulmonary interstitial markings have increased bilaterally. The hemidiaphragms are obscured though this is stable. The cardiac silhouette remains enlarged. The endotracheal tube tip lies 4.5 cm above the crotch of the carina. The right-sided PICC line in the left internal jugular venous catheter remain in appropriate position. The pigtail catheter on the left is stable. No pneumothorax is demonstrated.  IMPRESSION: Worsening pulmonary interstitial edema bilaterally may reflect the patient's volume status or cardiac function. Pleural fluid on the right and possibly on the left is present.   Electronically Signed   By: David  [place]   On: 02/13/2014 07:36     Medications:   . sodium chloride Stopped (02/01/14 0830)  . sodium chloride 250 mL (02/13/14 1800)  . milrinone 0.25 mcg/kg/min (02/14/14 0801)  . norepinephrine (LEVOPHED) Adult infusion 7 mcg/min (02/14/14 0800)  . dialysis replacement fluid (prismasate) 300  mL/hr at 02/14/14 1053  . dialysis replacement fluid (prismasate) 200 mL/hr at 02/14/14 0803  . dialysate (PRISMASATE) 2,000 mL/hr at 02/14/14 1053   . aspirin  324 mg Per Tube Daily  . bisacodyl  10 mg Oral Daily   Or  . bisacodyl  10 mg Rectal Daily  . chlorhexidine  15 mL Mouth Rinse BID  . feeding supplement (NEPRO CARB STEADY)  1,000 mL Oral Q24H  . feeding supplement (PRO-STAT SUGAR FREE 64)  30 mL Per Tube 5 X Daily  . heparin subcutaneous  5,000 Units Subcutaneous 3 times per day  . insulin aspart  0-24 Units Subcutaneous 6 times per day  . insulin glargine  25 Units Subcutaneous BID  . levalbuterol  0.63 mg Nebulization 4 times per day  . levothyroxine  37.5 mcg Intravenous QAC breakfast  . pantoprazole sodium  40 mg Per Tube Q1200  . piperacillin-tazobactam (ZOSYN)  IV  2.25 g Intravenous Q6H  . sodium chloride  10-40 mL Intracatheter Q12H   acetaminophen, heparin, morphine injection, ondansetron (ZOFRAN) IV, oxyCODONE, sodium chloride, sodium chloride  Assessment/ Plan:  1. Acute kidney injury appears to have declined with hypotension and shock   2. I would be cautious with vancomycin dosing   3. Electrolytes stable K 3.3 and sodium 136  4.  Acid / base Bicarbonate 26  5. HTN/Volume will try to remove fluid 50 -100 / hr  Tolerating CRRT      LOS: 20 Tresha Muzio W @TODAY @12 :09 PM

## 2014-02-14 NOTE — Progress Notes (Signed)
20 Days Post-Op Procedure(s) (LRB): CORONARY ARTERY BYPASS GRAFTING TIMES FOUR USING LEFT INTERNAL MAMMARY ARTERY TO LAD, SAPHENOUS VEIN GRAFTS TO OM1, OM2, AND PDA (N/A) MITRAL VALVE (MV) REPLACEMENT (N/A) Subjective: Awake, responds to questions with nodding, shaking head, not talking  Objective: Vital signs in last 24 hours: Temp:  [95.4 F (35.2 C)-98.3 F (36.8 C)] 97 F (36.1 C) (08/01 0804) Pulse Rate:  [79-80] 80 (08/01 0700) Cardiac Rhythm:  [-] Heart block (07/31 2013) Resp:  [17-28] 19 (08/01 0700) BP: (74-110)/(42-72) 92/48 mmHg (08/01 0700) SpO2:  [97 %-100 %] 99 % (08/01 0805) Arterial Line BP: (90-140)/(39-105) 103/47 mmHg (08/01 0700) Weight:  [223 lb 8.7 oz (101.4 kg)] 223 lb 8.7 oz (101.4 kg) (08/01 0500)  Hemodynamic parameters for last 24 hours: CVP:  [9 mmHg-25 mmHg] 11 mmHg  Intake/Output from previous day: 07/31 0701 - 08/01 0700 In: 1974.1 [I.V.:524.1; NG/GT:1250; IV Piggyback:200] Out: 4153 [Urine:65; Stool:500; Chest Tube:50] Intake/Output this shift:    General appearance: no distress and slowed mentation Neurologic: diffusely weak Heart: regular rate and rhythm Lungs: clear anteriorly Abdomen: normal findings: soft, non-tender Wound: clean and dry  Lab Results:  Recent Labs  02/13/14 0440 02/14/14 0352  WBC 11.7* 9.2  HGB 8.0* 7.6*  HCT 25.2* 24.5*  PLT 261 209   BMET:  Recent Labs  02/13/14 1600 02/14/14 0352  NA 138 133*  K 3.6* 3.3*  CL 101 93*  CO2 25 26  GLUCOSE 149* 223*  BUN 37* 31*  CREATININE 1.51* 1.29*  CALCIUM 8.0* 7.7*    PT/INR: No results found for this basename: LABPROT, INR,  in the last 72 hours ABG    Component Value Date/Time   PHART 7.462* 02/13/2014 1021   HCO3 27.4* 02/13/2014 1021   TCO2 29 02/13/2014 1021   ACIDBASEDEF 0.1 01/30/2014 0339   O2SAT 59.4 02/14/2014 0355   CBG (last 3)   Recent Labs  02/13/14 1956 02/13/14 2317 02/14/14 0354  GLUCAP 169* 159* 190*    Assessment/Plan: S/P  Procedure(s) (LRB): CORONARY ARTERY BYPASS GRAFTING TIMES FOUR USING LEFT INTERNAL MAMMARY ARTERY TO LAD, SAPHENOUS VEIN GRAFTS TO OM1, OM2, AND PDA (N/A) MITRAL VALVE (MV) REPLACEMENT (N/A) - CV- stable- continue milrinone and norepi  RESP- extubated yesterday, sats OK on 2L [place]  CXR shows some improvement in edema  Poor cough and can't do IS  RENAL- hypokalemia- defer to renal  On CVVH- 2L negative yesterday  ENDO CBG < 200  Deconditioning- profound  ID on zosyn, WBC improved     LOS: 20 days    [name] C 02/14/2014

## 2014-02-14 NOTE — Progress Notes (Signed)
PULMONARY / CRITICAL CARE MEDICINE  Name: [name] MRN: [id number] DOB: [birth year]    ADMISSION DATE:  01/25/2014 CONSULTATION DATE:  02/14/2014  REFERRING MD :  Tyrone Sage  CHIEF COMPLAINT:  Hypoxemia  INITIAL PRESENTATION: 73 yo admitted 7/12 with STEMI. Taken to cath lab but unable to perform PCI due to significant RCA disease. Taken to OR emergently for CABG x 4.  Returned to SICU mechanically ventilated.   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 Reintubated 7/27 FOB >>> no overt airway obstruction noted 7/27 CT chest >>> large L effusion, moderate R effusion 7/27 CT guided pleural catheter placed by IR. 1300 cc of serosanguinous fluid  7/28 Renal consult >>> CRRT initiated 7/31: extubated    INTERVAL HISTORY:  Extubated 7/31, marginal cough.  Severely weak  VITAL SIGNS: Temp:  [95.4 F (35.2 C)-98.3 F (36.8 C)] 97 F (36.1 C) (08/01 0804) Pulse Rate:  [79-80] 80 (08/01 0700) Resp:  [17-28] 19 (08/01 0700) BP: (74-110)/(42-72) 92/48 mmHg (08/01 0700) SpO2:  [97 %-100 %] 99 % (08/01 0805) Arterial Line BP: (90-140)/(39-105) 103/47 mmHg (08/01 0700) Weight:  [101.4 kg (223 lb 8.7 oz)] 101.4 kg (223 lb 8.7 oz) (08/01 0500)  HEMODYNAMICS: CVP:  [9 mmHg-25 mmHg] 11 mmHg  2L [place] sats 100%    INTAKE / OUTPUT: Intake/Output     07/31 0701 - 08/01 0700 08/01 0701 - 08/02 0700   I.V. (mL/kg) 524.1 (5.2)    Other     NG/GT 1250    IV Piggyback 200    Total Intake(mL/kg) 1974.1 (19.5)    Urine (mL/kg/hr) 65 (0)    Other 3538 (1.5)    Stool 500 (0.2)    Chest Tube 50 (0)    Total Output 4153     Net -2178.9           PHYSICAL EXAMINATION: General: Appears comfortable Neuro: Following commands HEENT:  Panda tube Cardiovascular: Regular, no murmurs Lungs: less  rales, poor cough Abdomen: Soft, non tender, bowel sounds present Ext: Bilateral symmetric pitting edema  LABS: CBC  Recent Labs Lab 02/12/14 0400 02/13/14 0440 02/14/14 0352  WBC 16.5* 11.7* 9.2  HGB 8.8* 8.0* 7.6*  HCT 27.9* 25.2* 24.5*  PLT 320 261 209   Coag's  Recent Labs Lab 02/08/14 0730 02/08/14 1600 02/09/14 0330  APTT 115* 88* 113*   BMET  Recent Labs Lab 02/13/14 0440 02/13/14 1600 02/14/14 0352  NA 136* 138 133*  K 3.7 3.6* 3.3*  CL 97 101 93*  CO2 24 25 26   BUN 47* 37* 31*  CREATININE 1.67* 1.51* 1.29*  GLUCOSE 114* 149* 223*   Electrolytes  Recent Labs Lab 02/13/14 0440 02/13/14 1600 02/14/14 0352  CALCIUM 7.9* 8.0* 7.7*  PHOS 3.2 2.5 2.3    Sepsis Markers No results found for this basename: LATICACIDVEN, PROCALCITON, O2SATVEN,  in the last 168 hours  ABG  Recent Labs Lab 02/08/14 0500 02/09/14 0453 02/13/14 1021  PHART 7.499* 7.503* 7.462*  PCO2ART 45.3* 38.5 38.1  PO2ART 84.0 114.0* 90.0   Liver Enzymes  Recent Labs Lab 02/12/14 0400  02/13/14 0440 02/13/14 1600 02/14/14 0352  AST 24  --  30  --  18  ALT 19  --  16  --  13  ALKPHOS 142*  --  93  --  83  BILITOT 1.0  --  0.7  --  0.7  ALBUMIN 2.5*  < > 2.3*  2.3* 2.3* 2.3*  < > = values in this interval not displayed. Cardiac Enzymes No results found for this basename: TROPONINI, PROBNP,  in the last 168 hours  Glucose  Recent Labs Lab 02/13/14 0737 02/13/14 1159 02/13/14 1614 02/13/14 1956 02/13/14 2317 02/14/14 0354  GLUCAP 143* 128* 158* 169* 159* 190*   IMAGING: CXR 8/1: improved aeration, less edema/effusion  ASSESSMENT / PLAN:  Acute hypoxemic respiratory failure, extubated 7/31 Goal SpO2>92 Supplemental oxygen Flutter valve / incentive spirometry  Large L pleural effusion - s/p pleural cath drainage 7/27 Chest tube management per TCTS  AKI Acute on chronic diastolic heart failure Pulmonary edema  Renal following Cont CRRT Continue  Levophed / Milrinone  Suspected HCAP Continue Zosyn day 4 / 7 planned  Px Protonix Heparin Rodriguez Hevia  I have personally obtained history, examined patient, evaluated and interpreted laboratory and imaging results, reviewed medical records, formulated assessment / plan and placed orders.  CRITICAL CARE:  The patient is critically ill with multiple organ systems failure and requires high complexity decision making for assessment and support, frequent evaluation and titration of therapies, application of advanced monitoring technologies and extensive interpretation of multiple databases. Critical Care Time devoted to patient care services described in this note is 35 minutes.   Shan LevansPatrick Wright, MD Beeper  [phone]  Cell  [phone]  If no response or cell goes to voicemail, call beeper [phone]  Pulmonary and Critical Care Medicine [facility] Pager: [phone]  02/14/2014, 8:09 AM

## 2014-02-15 LAB — CBC
HCT: 25.6 % — ABNORMAL LOW (ref 36.0–46.0)
Hemoglobin: 7.8 g/dL — ABNORMAL LOW (ref 12.0–15.0)
MCH: 29.9 pg (ref 26.0–34.0)
MCHC: 30.5 g/dL (ref 30.0–36.0)
MCV: 98.1 fL (ref 78.0–100.0)
Platelets: 190 10*3/uL (ref 150–400)
RBC: 2.61 MIL/uL — ABNORMAL LOW (ref 3.87–5.11)
RDW: 17.7 % — ABNORMAL HIGH (ref 11.5–15.5)
WBC: 10 10*3/uL (ref 4.0–10.5)

## 2014-02-15 LAB — CARBOXYHEMOGLOBIN
Carboxyhemoglobin: 1.7 % — ABNORMAL HIGH (ref 0.5–1.5)
Methemoglobin: 0.6 % (ref 0.0–1.5)
O2 Saturation: 60.9 %
Total hemoglobin: 7.6 g/dL — ABNORMAL LOW (ref 12.0–16.0)

## 2014-02-15 LAB — COMPREHENSIVE METABOLIC PANEL
ALT: 14 U/L (ref 0–35)
AST: 19 U/L (ref 0–37)
Albumin: 2.4 g/dL — ABNORMAL LOW (ref 3.5–5.2)
Alkaline Phosphatase: 88 U/L (ref 39–117)
Anion gap: 14 (ref 5–15)
BUN: 28 mg/dL — ABNORMAL HIGH (ref 6–23)
CO2: 26 mEq/L (ref 19–32)
Calcium: 8 mg/dL — ABNORMAL LOW (ref 8.4–10.5)
Chloride: 98 mEq/L (ref 96–112)
Creatinine, Ser: 1.27 mg/dL — ABNORMAL HIGH (ref 0.50–1.10)
GFR calc Af Amer: 47 mL/min — ABNORMAL LOW (ref >90)
GFR calc non Af Amer: 41 mL/min — ABNORMAL LOW (ref >90)
Glucose, Bld: 176 mg/dL — ABNORMAL HIGH (ref 70–99)
Potassium: 3.8 mEq/L (ref 3.7–5.3)
Sodium: 138 mEq/L (ref 137–147)
Total Bilirubin: 0.6 mg/dL (ref 0.3–1.2)
Total Protein: 7.3 g/dL (ref 6.0–8.3)

## 2014-02-15 LAB — RENAL FUNCTION PANEL
Albumin: 2.2 g/dL — ABNORMAL LOW (ref 3.5–5.2)
Anion gap: 11 (ref 5–15)
BUN: 27 mg/dL — ABNORMAL HIGH (ref 6–23)
CALCIUM: 7.6 mg/dL — AB (ref 8.4–10.5)
CO2: 24 meq/L (ref 19–32)
CREATININE: 1.2 mg/dL — AB (ref 0.50–1.10)
Chloride: 102 mEq/L (ref 96–112)
GFR calc Af Amer: 50 mL/min — ABNORMAL LOW (ref >90)
GFR, EST NON AFRICAN AMERICAN: 43 mL/min — AB (ref >90)
Glucose, Bld: 120 mg/dL — ABNORMAL HIGH (ref 70–99)
PHOSPHORUS: 2.2 mg/dL — AB (ref 2.3–4.6)
Potassium: 3.5 mEq/L — ABNORMAL LOW (ref 3.7–5.3)
Sodium: 137 mEq/L (ref 137–147)

## 2014-02-15 LAB — GLUCOSE, CAPILLARY
Glucose-Capillary: 124 mg/dL — ABNORMAL HIGH (ref 70–99)
Glucose-Capillary: 126 mg/dL — ABNORMAL HIGH (ref 70–99)
Glucose-Capillary: 108 mg/dL — ABNORMAL HIGH (ref 70–99)
Glucose-Capillary: 79 mg/dL (ref 70–99)
Glucose-Capillary: 118 mg/dL — ABNORMAL HIGH (ref 70–99)

## 2014-02-15 LAB — PHOSPHORUS: Phosphorus: 2.5 mg/dL (ref 2.3–4.6)

## 2014-02-15 MED ORDER — LEVOTHYROXINE SODIUM 75 MCG PO TABS
75.0000 ug | ORAL_TABLET | Freq: Every day | ORAL | Status: DC
  Administered 2014-02-15 – 2014-03-06 (×20): 75 ug
  Filled 2014-02-15 (×21): qty 1

## 2014-02-15 NOTE — Progress Notes (Signed)
PULMONARY / CRITICAL CARE MEDICINE  Name: Brooke GandyLouise S Mikowski MRN: [id number] DOB: [birth year]    ADMISSION DATE:  01/25/2014 CONSULTATION DATE:  02/15/2014  REFERRING MD :  Tyrone SageGerhardt  CHIEF COMPLAINT:  Hypoxemia  INITIAL PRESENTATION: 73 yo admitted 7/12 with STEMI. Taken to cath lab but unable to perform PCI due to significant RCA disease. Taken to OR emergently for CABG x 4.  Returned to SICU mechanically ventilated.    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 Reintubated 7/27 FOB >>> no overt airway obstruction noted 7/27 CT chest >>> large L effusion, moderate R effusion 7/27 CT guided pleural catheter placed by IR. 1300 cc of serosanguinous fluid  7/28 Renal consult >>> CRRT initiated 7/31: extubated    INTERVAL HISTORY:  Remains weak , on levophed & milrinone gtt Afebrile Hypothermic on CVVH Neg balance achieved  VITAL SIGNS: Temp:  [95.6 F (35.3 C)-97.9 F (36.6 C)] 95.6 F (35.3 C) (08/02 0700) Pulse Rate:  [79-90] 86 (08/02 0700) Resp:  [14-23] 15 (08/02 0700) BP: (84-117)/(44-63) 90/47 mmHg (08/02 0700) SpO2:  [98 %-100 %] 100 % (08/02 0700) Arterial Line BP: (82-141)/(40-98) 110/57 mmHg (08/02 0700)  HEMODYNAMICS: CVP:  [8 mmHg-27 mmHg] 18 mmHg     INTAKE / OUTPUT: Intake/Output     08/01 0701 - 08/02 0700 08/02 0701 - 08/03 0700   I.V. (mL/kg) 572 (5.6)    Other 30    NG/GT 1300    IV Piggyback 200    Total Intake(mL/kg) 2102 (20.7)    Urine (mL/kg/hr) 25 (0)    Other 3493 (1.4)    Stool 600 (0.2)    Chest Tube 25 (0)    Total Output 4143     Net -2041           PHYSICAL EXAMINATION: General: Appears comfortable, chronically ill Neuro: Following commands, weak , softs peech HEENT:  Panda tube Cardiovascular: Regular, no murmurs Lungs: less  rales, poor cough Abdomen: Soft, non tender, bowel sounds present Ext: Bilateral symmetric pitting edema  LABS: CBC  Recent Labs Lab 02/13/14 0440 02/14/14 0352 02/15/14 0400  WBC 11.7* 9.2 10.0  HGB 8.0* 7.6* 7.8*  HCT 25.2* 24.5* 25.6*  PLT 261 209 190   Coag's  Recent Labs Lab 02/08/14 1600 02/09/14 0330  APTT 88* 113*   BMET  Recent Labs Lab 02/14/14 0352 02/14/14 1536 02/15/14 0400  NA 133* 135* 138  K 3.3* 3.5* 3.8  CL 93* 100 98  CO2 26 26 26   BUN 31* 30* 28*  CREATININE 1.29* 1.32* 1.27*  GLUCOSE 223* 141* 176*   Electrolytes  Recent Labs Lab 02/14/14 0352 02/14/14 1536 02/15/14 0400  CALCIUM 7.7* 7.9* 8.0*  PHOS 2.3 2.3 2.5    Sepsis Markers No results found for this basename: LATICACIDVEN, PROCALCITON, O2SATVEN,  in the last 168 hours  ABG  Recent Labs Lab 02/09/14 0453 02/13/14 1021  PHART 7.503* 7.462*  PCO2ART 38.5 38.1  PO2ART 114.0* 90.0   Liver Enzymes  Recent Labs Lab 02/13/14 0440  02/14/14 0352 02/14/14 1536 02/15/14 0400  AST 30  --  18  --  19  ALT 16  --  13  --  14  ALKPHOS 93  --  83  --  88  BILITOT  0.7  --  0.7  --  0.6  ALBUMIN 2.3*  2.3*  < > 2.3* 2.3* 2.4*  < > = values in this interval not displayed. Cardiac Enzymes No results found for this basename: TROPONINI, PROBNP,  in the last 168 hours  Glucose  Recent Labs Lab 02/14/14 0354 02/14/14 0800 02/14/14 1122 02/14/14 1539 02/14/14 2018 02/14/14 2335  GLUCAP 190* 108* 137* 153* 115* 124*   IMAGING: CXR 8/1: improved aeration, less edema/effusion  ASSESSMENT / PLAN:  Acute hypoxemic respiratory failure, extubated 7/31 Goal SpO2>92 Supplemental oxygen Flutter valve / incentive spirometry Need to mobilise, PT eval  Large L pleural effusion - s/p pleural cath drainage 7/27 Chest tube management per TCTS  AKI Acute on chronic diastolic heart failure Pulmonary edema  Renal following Cont CRRT Continue Levophed /  Milrinone  Suspected HCAP Continue Zosyn day 5/ 7 planned  Px Protonix Heparin Gantt  I have personally obtained history, examined patient, evaluated and interpreted laboratory and imaging results, reviewed medical records, formulated assessment / plan and placed orders.  CRITICAL CARE:  The patient is critically ill with multiple organ systems failure and requires high complexity decision making for assessment and support, frequent evaluation and titration of therapies, application of advanced monitoring technologies and extensive interpretation of multiple databases. Critical Care Time devoted to patient care services described in this note is 30 minutes.   Oretha Milch., MD   02/15/2014, 7:45 AM

## 2014-02-15 NOTE — Progress Notes (Signed)
21 Days Post-Op Procedure(s) (LRB): CORONARY ARTERY BYPASS GRAFTING TIMES FOUR USING LEFT INTERNAL MAMMARY ARTERY TO LAD, SAPHENOUS VEIN GRAFTS TO OM1, OM2, AND PDA (N/A) MITRAL VALVE (MV) REPLACEMENT (N/A) Subjective: More alert today  Objective: Vital signs in last 24 hours: Temp:  [95.6 F (35.3 C)-97.9 F (36.6 C)] 95.6 F (35.3 C) (08/02 0700) Pulse Rate:  [79-90] 86 (08/02 0700) Cardiac Rhythm:  [-] Heart block (08/01 2028) Resp:  [14-23] 15 (08/02 0700) BP: (84-117)/(44-63) 90/47 mmHg (08/02 0700) SpO2:  [98 %-100 %] 100 % (08/02 0700) Arterial Line BP: (82-141)/(40-98) 110/57 mmHg (08/02 0700)  Hemodynamic parameters for last 24 hours: CVP:  [8 mmHg-27 mmHg] 18 mmHg  Intake/Output from previous day: 08/01 0701 - 08/02 0700 In: 2102 [I.V.:572; NG/GT:1300; IV Piggyback:200] Out: 4143 [Urine:25; Stool:600; Chest Tube:25] Intake/Output this shift:    General appearance: alert and no distress Neurologic: still profoundly weak Heart: regular rate and rhythm Lungs: clear to auscultation bilaterally Abdomen: normal findings: soft, non-tender Wound: clean and dry  Lab Results:  Recent Labs  02/14/14 0352 02/15/14 0400  WBC 9.2 10.0  HGB 7.6* 7.8*  HCT 24.5* 25.6*  PLT 209 190   BMET:  Recent Labs  02/14/14 1536 02/15/14 0400  NA 135* 138  K 3.5* 3.8  CL 100 98  CO2 26 26  GLUCOSE 141* 176*  BUN 30* 28*  CREATININE 1.32* 1.27*  CALCIUM 7.9* 8.0*    PT/INR: No results found for this basename: LABPROT, INR,  in the last 72 hours ABG    Component Value Date/Time   PHART 7.462* 02/13/2014 1021   HCO3 27.4* 02/13/2014 1021   TCO2 29 02/13/2014 1021   ACIDBASEDEF 0.1 01/30/2014 0339   O2SAT 60.9 02/15/2014 0400   CBG (last 3)   Recent Labs  02/14/14 1539 02/14/14 2018 02/14/14 2335  GLUCAP 153* 115* 124*    Assessment/Plan: S/P Procedure(s) (LRB): CORONARY ARTERY BYPASS GRAFTING TIMES FOUR USING LEFT INTERNAL MAMMARY ARTERY TO LAD, SAPHENOUS VEIN  GRAFTS TO OM1, OM2, AND PDA (N/A) MITRAL VALVE (MV) REPLACEMENT (N/A) - CV- co-ox 60 on milrinone and levophed  RESP- remains off vent  Recheck CXR tomorrow  RENAL_ on CVVHD, getting approximately 2 L a day off  ENDO- CBG well controlled  Nutrition- tolerating TF  NEURO- more alert but still profoundly weak- PT tomorrow to assist with mobilization   LOS: 21 days    HENDRICKSON,STEVEN C 02/15/2014

## 2014-02-15 NOTE — Progress Notes (Signed)
[facility] ROUNDING NOTE   Subjective:   Interval History: excellent progress on CRRT with great fluid removal. Hypotension cold be limiting   Objective:  Vital signs in last 24 hours:  Temp:  [95.1 F (35.1 C)-97.9 F (36.6 C)] 97 F (36.1 C) (08/02 1100) Pulse Rate:  [79-91] 81 (08/02 1100) Resp:  [14-23] 17 (08/02 1100) BP: (84-117)/(44-63) 96/49 mmHg (08/02 1100) SpO2:  [98 %-100 %] 98 % (08/02 1100) Arterial Line BP: (70-141)/(40-98) 70/46 mmHg (08/02 1100)  Weight change:  Filed Weights   02/13/14 0000 02/13/14 0500 02/14/14 0500  Weight: 105.1 kg (231 lb 11.3 oz) 105.1 kg (231 lb 11.3 oz) 101.4 kg (223 lb 8.7 oz)    Intake/Output: I/O last 3 completed shifts: In: 3118.7 [I.V.:828.7; Other:30; NG/GT:1960; IV Piggyback:300] Out: 5965 [Urine:50; Other:5070; Stool:800; Chest Tube:45]   Intake/Output this shift:  Total I/O In: 389 [I.V.:89; Other:30; NG/GT:220; IV Piggyback:50] Out: 1030 [Other:730; Stool:300]  Gen alert and following commands,  CVS: RRR,  Lungs: Mostly clear, somewhat diminished in bases  Abdomen: soft, non-tender; bowel sounds normal;  Extremities: mild lower extremity edema Wound: Clean and dry, no erythema or drainage    Basic Metabolic Panel:  Recent Labs Lab 02/13/14 0440 02/13/14 1600 02/14/14 0352 02/14/14 1536 02/15/14 0400  NA 136* 138 133* 135* 138  K 3.7 3.6* 3.3* 3.5* 3.8  CL 97 101 93* 100 98  CO2 24 25 26 26 26   GLUCOSE 114* 149* 223* 141* 176*  BUN 47* 37* 31* 30* 28*  CREATININE 1.67* 1.51* 1.29* 1.32* 1.27*  CALCIUM 7.9* 8.0* 7.7* 7.9* 8.0*  PHOS 3.2 2.5 2.3 2.3 2.5    Liver Function Tests:  Recent Labs Lab 02/11/14 0426 02/12/14 0400  02/13/14 0440 02/13/14 1600 02/14/14 0352 02/14/14 1536 02/15/14 0400  AST 30 24  --  30  --  18  --  19  ALT 24 19  --  16  --  13  --  14  ALKPHOS 128* 142*  --  93  --  83  --  88  BILITOT 1.2 1.0  --  0.7  --  0.7  --  0.6  PROT 6.5 6.8  --  6.5  --   7.0  --  7.3  ALBUMIN 2.6* 2.5*  < > 2.3*  2.3* 2.3* 2.3* 2.3* 2.4*  < > = values in this interval not displayed. No results found for this basename: LIPASE, AMYLASE,  in the last 168 hours No results found for this basename: AMMONIA,  in the last 168 hours  CBC:  Recent Labs Lab 02/11/14 0426 02/12/14 0400 02/13/14 0440 02/14/14 0352 02/15/14 0400  WBC 19.7* 16.5* 11.7* 9.2 10.0  HGB 9.3* 8.8* 8.0* 7.6* 7.8*  HCT 28.3* 27.9* 25.2* 24.5* 25.6*  MCV 91.9 93.6 95.5 96.8 98.1  PLT 396 320 261 209 190    Cardiac Enzymes: No results found for this basename: CKTOTAL, CKMB, CKMBINDEX, TROPONINI,  in the last 168 hours  BNP: No components found with this basename: POCBNP,   CBG:  Recent Labs Lab 02/14/14 1122 02/14/14 1539 02/14/14 2018 02/14/14 2335 02/15/14 0740  GLUCAP 137* 153* 115* 124* 126*    Microbiology: Results for orders placed during the hospital encounter of 01/25/14  MRSA PCR SCREENING     Status: None   Collection Time    01/26/14 11:10 AM      Result Value Ref Range Status   MRSA by PCR NEGATIVE  NEGATIVE Final  Comment:            The GeneXpert MRSA Assay (FDA     approved for NASAL specimens     only), is one component of a     comprehensive MRSA colonization     surveillance program. It is not     intended to diagnose MRSA     infection nor to guide or     monitor treatment for     MRSA infections.  CULTURE, RESPIRATORY (NON-EXPECTORATED)     Status: None   Collection Time    01/27/14  9:53 AM      Result Value Ref Range Status   Specimen Description TRACHEAL ASPIRATE   Final   Special Requests Normal   Final   Gram Stain     Final   Value: FEW WBC PRESENT,BOTH PMN AND MONONUCLEAR     NO SQUAMOUS EPITHELIAL CELLS SEEN     NO ORGANISMS SEEN     Performed at [facility]   Culture     Final   Value: NO GROWTH 2 DAYS     Performed at [facility]   Report Status 01/29/2014 FINAL   Final  URINE CULTURE     Status: None    Collection Time    02/02/14 12:19 PM      Result Value Ref Range Status   Specimen Description URINE, CATHETERIZED   Final   Special Requests Normal   Final   Culture  Setup Time     Final   Value: 02/02/2014 19:42     Performed at [facility] Count     Final   Value: NO GROWTH     Performed at [facility]   Culture     Final   Value: NO GROWTH     Performed at [facility]   Report Status 02/03/2014 FINAL   Final  CLOSTRIDIUM DIFFICILE BY PCR     Status: None   Collection Time    02/05/14  5:18 PM      Result Value Ref Range Status   C difficile by pcr NEGATIVE  NEGATIVE Final  AFB CULTURE WITH SMEAR     Status: None   Collection Time    02/06/14  2:45 PM      Result Value Ref Range Status   Specimen Description FLUID PLEURAL   Final   Special Requests NONE   Final   Acid Fast Smear     Final   Value: NO ACID FAST BACILLI SEEN     Performed at [facility]   Culture     Final   Value: CULTURE WILL BE EXAMINED FOR 6 WEEKS BEFORE ISSUING A FINAL REPORT     Performed at [facility]   Report Status PENDING   Incomplete  BODY FLUID CULTURE     Status: None   Collection Time    02/06/14  2:45 PM      Result Value Ref Range Status   Specimen Description PLEURAL FLUID   Final   Special Requests NONE   Final   Gram Stain     Final   Value: NO WBC SEEN     NO ORGANISMS SEEN     Performed at [facility]   Culture     Final   Value: NO GROWTH 3 DAYS     Performed at [facility]   Report Status 02/10/2014 FINAL  Final  FUNGUS CULTURE W SMEAR     Status: None   Collection Time    02/06/14  2:46 PM      Result Value Ref Range Status   Specimen Description FLUID PLEURAL   Final   Special Requests NONE   Final   Fungal Smear     Final   Value: NO YEAST OR FUNGAL ELEMENTS SEEN     Performed at [facility]   Culture     Final   Value: CULTURE IN PROGRESS FOR FOUR WEEKS     Performed at [facility]   Report Status PENDING   Incomplete  CULTURE, RESPIRATORY (NON-EXPECTORATED)     Status: None   Collection Time    02/09/14  8:27 AM      Result Value Ref Range Status   Specimen Description TRACHEAL ASPIRATE   Final   Special Requests NONE   Final   Gram Stain     Final   Value: FEW WBC PRESENT,BOTH PMN AND MONONUCLEAR     RARE SQUAMOUS EPITHELIAL CELLS PRESENT     MODERATE GRAM NEGATIVE RODS     FEW GRAM POSITIVE RODS     RARE GRAM POSITIVE COCCI IN PAIRS   Culture     Final   Value: Non-Pathogenic Oropharyngeal-type Flora Isolated.     Performed at [facility]   Report Status 02/11/2014 FINAL   Final  CULTURE, ROUTINE-ABSCESS     Status: None   Collection Time    02/09/14  4:08 PM      Result Value Ref Range Status   Specimen Description ABSCESS JP DRAINAGE   Final   Special Requests NONE   Final   Gram Stain     Final   Value: FEW WBC PRESENT,BOTH PMN AND MONONUCLEAR     NO SQUAMOUS EPITHELIAL CELLS SEEN     NO ORGANISMS SEEN     Performed at [facility]   Culture     Final   Value: MULTIPLE ORGANISMS PRESENT, NONE PREDOMINANT     Note: NO STAPHYLOCOCCUS AUREUS ISOLATED NO GROUP A STREP (S.PYOGENES) ISOLATED     Performed at [facility]   Report Status 02/13/2014 FINAL   Final    Coagulation Studies: No results found for this basename: LABPROT, INR,  in the last 72 hours  Urinalysis: No results found for this basename: COLORURINE, APPERANCEUR, LABSPEC, PHURINE, GLUCOSEU, HGBUR, BILIRUBINUR, KETONESUR, PROTEINUR, UROBILINOGEN, NITRITE, LEUKOCYTESUR,  in the last 72 hours    Imaging: Dg Chest Port 1 View  02/14/2014   CLINICAL DATA:  Mitral valve replacement.  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 Cardiac silhouette remains enlarged. Left chest pigtail catheter remains in place, stable to slightly lower in the chest than on the  prior study. Left jugular central venous catheter remains in place with tip overlying the mid SVC. Right PICC remains in place with tip near the cavoatrial junction. Endotracheal tube has been removed. Enteric tube courses into the left upper abdomen with tip not imaged. There is persistent pulmonary vascular congestion with hazy bilateral lung opacities, slightly improved from the prior study. Small right and possibly small left pleural effusions are suspected. No pneumothorax is identified.  IMPRESSION: 1. Interval removal of endotracheal tube. Other lines and tubes as above. 2. Pulmonary vascular congestion and hazy bilateral lung opacities, compatible with pulmonary edema and slightly  improved from prior. Small right and possibly small left pleural effusions.   Electronically Signed   By: Sebastian Ache   On: 02/14/2014 07:26     Medications:   . sodium chloride Stopped (02/01/14 0830)  . sodium chloride 250 mL (02/15/14 0800)  . milrinone 0.25 mcg/kg/min (02/15/14 0826)  . norepinephrine (LEVOPHED) Adult infusion 6 mcg/min (02/15/14 0800)  . dialysis replacement fluid (prismasate) 300 mL/hr at 02/15/14 0607  . dialysis replacement fluid (prismasate) 200 mL/hr at 02/15/14 1002  . dialysate (PRISMASATE) 2,000 mL/hr at 02/15/14 1110   . aspirin  324 mg Per Tube Daily  . bisacodyl  10 mg Oral Daily   Or  . bisacodyl  10 mg Rectal Daily  . chlorhexidine  15 mL Mouth Rinse BID  . feeding supplement (NEPRO CARB STEADY)  1,000 mL Oral Q24H  . feeding supplement (PRO-STAT SUGAR FREE 64)  30 mL Per Tube 5 X Daily  . heparin subcutaneous  5,000 Units Subcutaneous 3 times per day  . insulin aspart  0-24 Units Subcutaneous 6 times per day  . insulin glargine  25 Units Subcutaneous BID  . levalbuterol  0.63 mg Nebulization 4 times per day  . levothyroxine  75 mcg Per Tube QAC breakfast  . pantoprazole sodium  40 mg Per Tube Q1200  . piperacillin-tazobactam (ZOSYN)  IV  2.25 g Intravenous Q6H  .  sodium chloride  10-40 mL Intracatheter Q12H   acetaminophen, heparin, morphine injection, ondansetron (ZOFRAN) IV, oxyCODONE, sodium chloride, sodium chloride  1. Acute kidney injury appears to have declined with hypotension and shock  2. I would be cautious with vancomycin dosing  3. Electrolytes stable K 3.8 and sodium 138 4. Acid / base Bicarbonate 26  5. HTN/Volume will try to remove fluid 50 -100 / hr  6. Hypophosphatemia stable  Phos 2.6 Tolerating CRRT     LOS: 21 Ileen Kahre W @TODAY @11 :24 AM

## 2014-02-15 NOTE — Progress Notes (Signed)
Awake minimally responsive  BP 108/51  Pulse 84  Temp(Src) 97.8 F (36.6 C) (Oral)  Resp 14  Ht 5\' 7"  (1.702 m)  Wt 223 lb 8.7 oz (101.4 kg)  BMI 35.00 kg/m2  SpO2 99%   Intake/Output Summary (Last 24 hours) at 02/15/14 1958 Last data filed at 02/15/14 1900  Gross per 24 hour  Intake   2068 ml  Output   4373 ml  Net  -2305 ml    Some PACs might go back into a fib

## 2014-02-16 ENCOUNTER — Inpatient Hospital Stay (HOSPITAL_COMMUNITY): Payer: Medicare Other

## 2014-02-16 DIAGNOSIS — J189 Pneumonia, unspecified organism: Secondary | ICD-10-CM

## 2014-02-16 DIAGNOSIS — I2119 ST elevation (STEMI) myocardial infarction involving other coronary artery of inferior wall: Principal | ICD-10-CM

## 2014-02-16 LAB — MAGNESIUM: Magnesium: 2.5 mg/dL (ref 1.5–2.5)

## 2014-02-16 LAB — COMPREHENSIVE METABOLIC PANEL
ALT: 13 U/L (ref 0–35)
AST: 23 U/L (ref 0–37)
Albumin: 2.4 g/dL — ABNORMAL LOW (ref 3.5–5.2)
Alkaline Phosphatase: 92 U/L (ref 39–117)
Anion gap: 11 (ref 5–15)
BUN: 26 mg/dL — ABNORMAL HIGH (ref 6–23)
CO2: 26 mEq/L (ref 19–32)
Calcium: 8.1 mg/dL — ABNORMAL LOW (ref 8.4–10.5)
Chloride: 102 mEq/L (ref 96–112)
Creatinine, Ser: 1.25 mg/dL — ABNORMAL HIGH (ref 0.50–1.10)
GFR calc Af Amer: 48 mL/min — ABNORMAL LOW (ref >90)
GFR calc non Af Amer: 41 mL/min — ABNORMAL LOW (ref >90)
Glucose, Bld: 117 mg/dL — ABNORMAL HIGH (ref 70–99)
Potassium: 3.9 mEq/L (ref 3.7–5.3)
Sodium: 139 mEq/L (ref 137–147)
Total Bilirubin: 0.6 mg/dL (ref 0.3–1.2)
Total Protein: 7.4 g/dL (ref 6.0–8.3)

## 2014-02-16 LAB — CBC
HCT: 25.2 % — ABNORMAL LOW (ref 36.0–46.0)
Hemoglobin: 7.6 g/dL — ABNORMAL LOW (ref 12.0–15.0)
MCH: 29.7 pg (ref 26.0–34.0)
MCHC: 30.2 g/dL (ref 30.0–36.0)
MCV: 98.4 fL (ref 78.0–100.0)
Platelets: 184 10*3/uL (ref 150–400)
RBC: 2.56 MIL/uL — ABNORMAL LOW (ref 3.87–5.11)
RDW: 17.7 % — ABNORMAL HIGH (ref 11.5–15.5)
WBC: 10.2 10*3/uL (ref 4.0–10.5)

## 2014-02-16 LAB — RENAL FUNCTION PANEL
ALBUMIN: 2.4 g/dL — AB (ref 3.5–5.2)
Anion gap: 12 (ref 5–15)
BUN: 28 mg/dL — ABNORMAL HIGH (ref 6–23)
CALCIUM: 8.1 mg/dL — AB (ref 8.4–10.5)
CO2: 27 meq/L (ref 19–32)
CREATININE: 1.35 mg/dL — AB (ref 0.50–1.10)
Chloride: 96 mEq/L (ref 96–112)
GFR calc Af Amer: 44 mL/min — ABNORMAL LOW (ref >90)
GFR, EST NON AFRICAN AMERICAN: 38 mL/min — AB (ref >90)
Glucose, Bld: 168 mg/dL — ABNORMAL HIGH (ref 70–99)
Phosphorus: 2.9 mg/dL (ref 2.3–4.6)
Potassium: 3.8 mEq/L (ref 3.7–5.3)
Sodium: 135 mEq/L — ABNORMAL LOW (ref 137–147)

## 2014-02-16 LAB — POCT ACTIVATED CLOTTING TIME
Activated Clotting Time: 146 seconds
Activated Clotting Time: 152 seconds

## 2014-02-16 LAB — POCT I-STAT 3, ART BLOOD GAS (G3+)
Acid-Base Excess: 4 mmol/L — ABNORMAL HIGH (ref 0.0–2.0)
Bicarbonate: 28.9 mEq/L — ABNORMAL HIGH (ref 20.0–24.0)
O2 Saturation: 96 %
Patient temperature: 67
TCO2: 30 mmol/L (ref 0–100)
pCO2 arterial: 19.6 mmHg — CL (ref 35.0–45.0)
pH, Arterial: 7.708 (ref 7.350–7.450)
pO2, Arterial: 25 mmHg — CL (ref 80.0–100.0)

## 2014-02-16 LAB — GLUCOSE, CAPILLARY
Glucose-Capillary: 117 mg/dL — ABNORMAL HIGH (ref 70–99)
Glucose-Capillary: 121 mg/dL — ABNORMAL HIGH (ref 70–99)
Glucose-Capillary: 124 mg/dL — ABNORMAL HIGH (ref 70–99)
Glucose-Capillary: 127 mg/dL — ABNORMAL HIGH (ref 70–99)
Glucose-Capillary: 144 mg/dL — ABNORMAL HIGH (ref 70–99)
Glucose-Capillary: 159 mg/dL — ABNORMAL HIGH (ref 70–99)
Glucose-Capillary: 99 mg/dL (ref 70–99)

## 2014-02-16 LAB — CARBOXYHEMOGLOBIN
Carboxyhemoglobin: 1.8 % — ABNORMAL HIGH (ref 0.5–1.5)
Methemoglobin: 0.6 % (ref 0.0–1.5)
O2 Saturation: 48.7 %
Total hemoglobin: 7.4 g/dL — ABNORMAL LOW (ref 12.0–16.0)

## 2014-02-16 LAB — PREPARE RBC (CROSSMATCH)

## 2014-02-16 MED ORDER — LEVALBUTEROL HCL 0.63 MG/3ML IN NEBU
0.6300 mg | INHALATION_SOLUTION | Freq: Four times a day (QID) | RESPIRATORY_TRACT | Status: DC
  Administered 2014-02-16 – 2014-02-28 (×45): 0.63 mg via RESPIRATORY_TRACT
  Filled 2014-02-16 (×85): qty 3

## 2014-02-16 MED ORDER — OXYCODONE HCL 5 MG/5ML PO SOLN
5.0000 mg | Freq: Four times a day (QID) | ORAL | Status: DC | PRN
  Administered 2014-03-04: 5 mg via ORAL
  Filled 2014-02-16: qty 5

## 2014-02-16 MED ORDER — HEPARIN BOLUS VIA INFUSION (CRRT)
1000.0000 [IU] | INTRAVENOUS | Status: DC | PRN
  Filled 2014-02-16 (×2): qty 1000

## 2014-02-16 MED ORDER — ACETAMINOPHEN 160 MG/5ML PO SOLN
325.0000 mg | ORAL | Status: DC | PRN
  Administered 2014-02-21 – 2014-02-23 (×4): 325 mg via ORAL
  Filled 2014-02-16 (×4): qty 20.3

## 2014-02-16 MED ORDER — SODIUM CHLORIDE 0.9 % IJ SOLN
250.0000 [IU]/h | INTRAMUSCULAR | Status: DC
  Administered 2014-02-16: 1000 [IU]/h via INTRAVENOUS_CENTRAL
  Administered 2014-02-16: 550 [IU]/h via INTRAVENOUS_CENTRAL
  Administered 2014-02-16: 750 [IU]/h via INTRAVENOUS_CENTRAL
  Administered 2014-02-16: 250 [IU]/h via INTRAVENOUS_CENTRAL
  Administered 2014-02-16: 650 [IU]/h via INTRAVENOUS_CENTRAL
  Administered 2014-02-17: 1600 [IU]/h via INTRAVENOUS_CENTRAL
  Administered 2014-02-17 (×3): 1650 [IU]/h via INTRAVENOUS_CENTRAL
  Administered 2014-02-17: 1250 [IU]/h via INTRAVENOUS_CENTRAL
  Administered 2014-02-17: 1650 [IU]/h via INTRAVENOUS_CENTRAL
  Administered 2014-02-18: 1600 [IU]/h via INTRAVENOUS_CENTRAL
  Administered 2014-02-18 (×2): 1650 [IU]/h via INTRAVENOUS_CENTRAL
  Administered 2014-02-19: 1100 [IU]/h via INTRAVENOUS_CENTRAL
  Filled 2014-02-16 (×14): qty 2

## 2014-02-16 MED ORDER — POTASSIUM PHOSPHATES 15 MMOLE/5ML IV SOLN
10.0000 mmol | Freq: Once | INTRAVENOUS | Status: AC
  Administered 2014-02-16: 10 mmol via INTRAVENOUS
  Filled 2014-02-16: qty 3.33

## 2014-02-16 NOTE — Progress Notes (Addendum)
[facility] DAILY ICU PROGRESS NOTE                   [address]            [place] [address]          [phone]   22 Days Post-Op Procedure(s) (LRB): CORONARY ARTERY BYPASS GRAFTING TIMES FOUR USING LEFT INTERNAL MAMMARY ARTERY TO LAD, SAPHENOUS VEIN GRAFTS TO OM1, OM2, AND PDA (N/A) MITRAL VALVE (MV) REPLACEMENT (N/A)  Total Length of Stay:  LOS: 22 days   Subjective: Awake, alert, moves extremities, not verbal.   Objective: Vital signs in last 24 hours: Temp:  [95.1 F (35.1 C)-97.9 F (36.6 C)] 96.5 F (35.8 C) (08/03 0730) Pulse Rate:  [81-109] 84 (08/03 0730) Cardiac Rhythm:  [-] Normal sinus rhythm (08/03 0725) Resp:  [14-20] 17 (08/03 0730) BP: (91-125)/(49-64) 111/62 mmHg (08/03 0700) SpO2:  [98 %-100 %] 100 % (08/03 0730) Arterial Line BP: (64-127)/(46-70) 80/66 mmHg (08/03 0730) Weight:  [210 lb 8.6 oz (95.5 kg)] 210 lb 8.6 oz (95.5 kg) (08/03 0700)  Filed Weights   02/13/14 0500 02/14/14 0500 02/16/14 0700  Weight: 231 lb 11.3 oz (105.1 kg) 223 lb 8.7 oz (101.4 kg) 210 lb 8.6 oz (95.5 kg)    Weight change:    Hemodynamic parameters for last 24 hours: CVP:  [9 mmHg-21 mmHg] 21 mmHg  Intake/Output from previous day: 08/02 0701 - 08/03 0700 In: 2054 [I.V.:574; NG/GT:1250; IV Piggyback:200] Out: 4464 [Urine:25; Stool:600; Chest Tube:30]  CBGs [phone]    Current Meds: Scheduled Meds: . aspirin  324 mg Per Tube Daily  . bisacodyl  10 mg Oral Daily   Or  . bisacodyl  10 mg Rectal Daily  . chlorhexidine  15 mL Mouth Rinse BID  . feeding supplement (NEPRO CARB STEADY)  1,000 mL Oral Q24H  . feeding supplement (PRO-STAT SUGAR FREE 64)  30 mL Per Tube 5 X Daily  . heparin subcutaneous  5,000 Units Subcutaneous 3 times per day  . insulin aspart  0-24 Units Subcutaneous 6 times per day  . insulin glargine  25 Units Subcutaneous BID  . levalbuterol  0.63 mg Nebulization 4 times per day  . levothyroxine  75 mcg Per Tube QAC breakfast  .  pantoprazole sodium  40 mg Per Tube Q1200  . piperacillin-tazobactam (ZOSYN)  IV  2.25 g Intravenous Q6H  . sodium chloride  10-40 mL Intracatheter Q12H   Continuous Infusions: . sodium chloride Stopped (02/01/14 0830)  . sodium chloride 250 mL (02/15/14 0800)  . milrinone 0.25 mcg/kg/min (02/15/14 2216)  . norepinephrine (LEVOPHED) Adult infusion 7 mcg/min (02/15/14 1600)  . dialysis replacement fluid (prismasate) 300 mL/hr at 02/15/14 1715  . dialysis replacement fluid (prismasate) 200 mL/hr at 02/15/14 1002  . dialysate (PRISMASATE) 2,000 mL/hr at 02/16/14 0643   PRN Meds:.acetaminophen, heparin, morphine injection, ondansetron (ZOFRAN) IV, oxyCODONE, sodium chloride, sodium chloride  Physical Exam: General appearance: no distress Heart: regular rate and rhythm Lungs: Coarse rhonchi bilaterally Abdomen: soft, non-tender; bowel sounds normal; no masses,  no organomegaly Extremities: +LE edema Wound: Clean and dry, no erythema or drainage    Lab Results: CBC: Recent Labs  02/15/14 0400 02/16/14 0400  WBC 10.0 10.2  HGB 7.8* 7.6*  HCT 25.6* 25.2*  PLT 190 184   BMET:  Recent Labs  02/15/14 1551 02/16/14 0400  NA 137 139  K 3.5* 3.9  CL 102 102  CO2 24 26  GLUCOSE 120* 117*  BUN 27* 26*  CREATININE 1.20* 1.25*  CALCIUM 7.6* 8.1*    PT/INR: No results found for this basename: LABPROT, INR,  in the last 72 hours Radiology: Dg Chest Port 1 View  02/16/2014   CLINICAL DATA:  Followup effusions  EXAM: PORTABLE CHEST - 1 VIEW  COMPARISON:  02/14/2014  FINDINGS: Postsurgical changes are again seen. The cardiac shadow is stable. A feeding catheter, right-sided PICC line and left-sided central venous catheter are again seen and stable. A left-sided pleural drain is again noted. No pneumothorax or sizable pleural effusion is noted on the left. Persistent right-sided pleural effusion is seen. Mild vascular congestion remains.  IMPRESSION: Persistent right-sided effusion stable  from the prior exam. Persistent vascular congestion is noted as well.  Persistent left pleural drain in satisfactory position.  Tubes and lines as described.   Electronically Signed   By: Alcide CleverMark  Lukens M.D.   On: 02/16/2014 07:22     Assessment/Plan: S/P Procedure(s) (LRB): CORONARY ARTERY BYPASS GRAFTING TIMES FOUR USING LEFT INTERNAL MAMMARY ARTERY TO LAD, SAPHENOUS VEIN GRAFTS TO OM1, OM2, AND PDA (N/A) MITRAL VALVE (MV) REPLACEMENT (N/A)  CV- Back in SR at present, remains on Milrinone, Levophed. BPs borderline. Co-ox today down to 48.7  Pulm- stable off vent.  Still has R effusion/atelectasis on CXR.  L CT in place with scant output. Possibly can d/c CT soon.  AKI- Cr stabilizing, Continue current care, CVVH.  Renal following.  Anemia- [name]/[name] down slightly, watch closely.  May need transfusion if she remains hypotensive.  Nutrition- Continue TFs.  ID- WBC trending down, no fever.  Zosyn D#6 for presumed pneumonia.  Hypokalemia- replace K+.  Deconditioning- mobilize as tolerated.   [name],[name] 02/16/2014 7:45 AM  Patient discussed with Elder NegusG Collins PA-C and agree with above assessment and plan Will start IV heparin for CVVH CXR still wet despite some weight loss w/ fluid removal Watch Hb on heparin and transfuse for Hb < 8.0 One unit today will improve low CO-Ox Stop Vanco, Zosyn tomorrow [facility]  LTAC eval requested

## 2014-02-16 NOTE — Plan of Care (Signed)
Problem: Problem: Cardiovascular Progression Goal: HEMODYNAMIC STABILITY Outcome: Progressing Requiring levophed for BP support Goal: NO EVIDENCE OF VOLUME OVERLOAD Outcome: Progressing Requiring CRRT for volume overload and renal failure

## 2014-02-16 NOTE — Progress Notes (Signed)
PULMONARY / CRITICAL CARE MEDICINE  Name: Brooke GandyLouise S Korzeniewski MRN: [id number] DOB: 07/25/1940    ADMISSION DATE:  01/25/2014 CONSULTATION DATE:  02/16/2014  REFERRING MD :  Tyrone SageGerhardt  CHIEF COMPLAINT:  Hypoxemia  INITIAL PRESENTATION: 73 yo admitted 7/12 with STEMI. Taken to cath lab but unable to perform PCI due to significant RCA disease. Taken to OR emergently for CABG x 4.  Returned to SICU mechanically ventilated.  Course c/b failed extubation, pleural effusion, renal failure on CRRT,    STUDIES / EVENTS:  7/12 Left heart cath: occlusion of RCA not amendable to PCI 7/13 CABG x 4 + MVR [name] 7/20 TTE:  EF 55%, no RWMA, grade 3 DD, PAP 41 torr 7/24 R thoracentesis 1,200 mL 7/25 Extubated 7/27 Left hemithorax opacification. Reintubated 7/27 FOB >>> no overt airway obstruction noted 7/27 CT chest >>> large L effusion, moderate R effusion 7/27 CT guided pleural catheter placed by IR. 1300 cc of serosanguinous fluid  7/28 Renal consult >>> CRRT initiated 7/31: extubated    INTERVAL HISTORY:  Very weak.  Poor cough, intermittently follows commands. Remains on low dose levo, milrinone, neg balance on CRRT.   VITAL SIGNS: Temp:  [96.2 F (35.7 C)-97.9 F (36.6 C)] 97.8 F (36.6 C) (08/03 1130) Pulse Rate:  [42-109] 103 (08/03 1130) Resp:  [14-20] 20 (08/03 1130) BP: (86-125)/(43-64) 94/50 mmHg (08/03 1130) SpO2:  [98 %-100 %] 99 % (08/03 1130) Arterial Line BP: (64-110)/(46-70) 70/55 mmHg (08/03 1130) Weight:  [210 lb 8.6 oz (95.5 kg)] 210 lb 8.6 oz (95.5 kg) (08/03 0700)  HEMODYNAMICS: CVP:  [9 mmHg-21 mmHg] 17 mmHg     INTAKE / OUTPUT: Intake/Output     08/02 0701 - 08/03 0700 08/03 0701 - 08/04 0700   I.V. (mL/kg) 574 (6) 88 (0.9)   Blood  350   Other 30    NG/GT 1250 320   IV Piggyback 200 176   Total Intake(mL/kg) 2054 (21.5) 934 (9.8)   Urine (mL/kg/hr) 25 (0)    Other 3809 (1.7) 981 (2.2)   Stool 600 (0.3)    Chest Tube 30 (0)    Total Output 4464 981   Net  -2410 -47         PHYSICAL EXAMINATION: General: Appears comfortable, chronically ill, NAD, very weak Neuro: drowsy, follows commands intermittently per nsg (did not for me), very weak HEENT:  Panda tube Cardiovascular: Regular, no murmurs, sternotomy healing Lungs: resps even non labored on [place], less rales, poor cough, L chest tube no air leak Abdomen: Soft, non tender, bowel sounds present Ext: Bilateral symmetric pitting edema  LABS: CBC  Recent Labs Lab 02/14/14 0352 02/15/14 0400 02/16/14 0400  WBC 9.2 10.0 10.2  HGB 7.6* 7.8* 7.6*  HCT 24.5* 25.6* 25.2*  PLT 209 190 184   Coag's No results found for this basename: APTT, INR,  in the last 168 hours BMET  Recent Labs Lab 02/15/14 0400 02/15/14 1551 02/16/14 0400  NA 138 137 139  K 3.8 3.5* 3.9  CL 98 102 102  CO2 26 24 26   BUN 28* 27* 26*  CREATININE 1.27* 1.20* 1.25*  GLUCOSE 176* 120* 117*   Electrolytes  Recent Labs Lab 02/14/14 1536 02/15/14 0400 02/15/14 1551 02/16/14 0400  CALCIUM 7.9* 8.0* 7.6* 8.1*  MG  --   --   --  2.5  PHOS 2.3 2.5 2.2*  --     Sepsis Markers No results found for this basename: LATICACIDVEN, PROCALCITON, O2SATVEN,  in  the last 168 hours  ABG  Recent Labs Lab 02/13/14 1021 02/14/14 1649  PHART 7.462* 7.708*  PCO2ART 38.1 19.6*  PO2ART 90.0 25.0*   Liver Enzymes  Recent Labs Lab 02/14/14 0352  02/15/14 0400 02/15/14 1551 02/16/14 0400  AST 18  --  19  --  23  ALT 13  --  14  --  13  ALKPHOS 83  --  88  --  92  BILITOT 0.7  --  0.6  --  0.6  ALBUMIN 2.3*  < > 2.4* 2.2* 2.4*  < > = values in this interval not displayed. Cardiac Enzymes No results found for this basename: TROPONINI, PROBNP,  in the last 168 hours  Glucose  Recent Labs Lab 02/15/14 1223 02/15/14 1529 02/15/14 1937 02/16/14 0039 02/16/14 0410 02/16/14 0738  GLUCAP 108* 79 118* 124* 117* 127*   IMAGING: No results found.   ASSESSMENT / PLAN:  Acute hypoxemic respiratory  failure, extubated 7/31 Suspected HCAP Goal SpO2>92 Supplemental oxygen Flutter valve / incentive spirometry Mobilize as able  Watch resp status carefully in ICU Continue fluid removal  Consider ABG - hold for now  F/u CXR  Large L pleural effusion - s/p pleural cath drainage 7/27 Chest tube management per [facility]  AKI Acute on chronic diastolic heart failure Pulmonary edema Renal following Cont CRRT with neg balance as tol  Continue Levophed / Milrinone  Anemia  F/u CBC  Suspected HCAP Continue Zosyn day 6/ 7 planned  Px Protonix Heparin [place]   Dirk Dress, NP 02/16/2014  11:42 AM Pager: [phone] or [phone]  *Care during the described time interval was provided by me and/or other providers on the critical care team. I have reviewed this patient's available data, including medical history, events of note, physical examination and test results as part of my evaluation.   CRITICAL CARE:  The patient is critically ill with multiple organ systems failure and requires high complexity decision making for assessment and support, frequent evaluation and titration of therapies, application of advanced monitoring technologies and extensive interpretation of multiple databases. Critical Care Time devoted to patient care services described in this note is 30 minutes.   Heber Strang, MD [facility] Pager: [phone] Cell: [phone] If no response, call [phone]

## 2014-02-16 NOTE — Progress Notes (Signed)
Patient ID: [name], female   DOB: 02-16-1941, 73 y.o.   MRN: [id number] EVENING ROUNDS NOTE :     [address]       [place] [address]             [phone]                 22 Days Post-Op Procedure(s) (LRB): CORONARY ARTERY BYPASS GRAFTING TIMES FOUR USING LEFT INTERNAL MAMMARY ARTERY TO LAD, SAPHENOUS VEIN GRAFTS TO OM1, OM2, AND PDA (N/A) MITRAL VALVE (MV) REPLACEMENT (N/A)  Total Length of Stay:  LOS: 22 days  BP 102/54  Pulse 84  Temp(Src) 97.9 F (36.6 C) (Oral)  Resp 18  Ht 5\' 7"  (1.702 m)  Wt 210 lb 8.6 oz (95.5 kg)  BMI 32.97 kg/m2  SpO2 99%  .Intake/Output     08/02 0701 - 08/03 0700 08/03 0701 - 08/04 0700   I.V. (mL/kg) 574 (6) 200.5 (2.1)   Blood  350   Other 30    NG/GT 1250 550   IV Piggyback 200 352   Total Intake(mL/kg) 2054 (21.5) 1452.5 (15.2)   Urine (mL/kg/hr) 25 (0)    Other 3809 (1.7) 1984 (2.1)   Stool 600 (0.3)    Chest Tube 30 (0)    Total Output 4464 1984   Net -2410 -531.5          . sodium chloride 10 mL/hr at 02/16/14 1455  . sodium chloride 250 mL (02/15/14 0800)  . heparin 10,000 units/ 20 mL infusion syringe 550 Units/hr (02/16/14 1653)  . milrinone 0.25 mcg/kg/min (02/16/14 1257)  . norepinephrine (LEVOPHED) Adult infusion 7 mcg/min (02/16/14 1014)  . dialysis replacement fluid (prismasate) 300 mL/hr at 02/16/14 1014  . dialysis replacement fluid (prismasate) 200 mL/hr at 02/16/14 1323  . dialysate (PRISMASATE) 2,000 mL/hr at 02/16/14 1433     Lab Results  Component Value Date   WBC 10.2 02/16/2014   HGB 7.6* 02/16/2014   HCT 25.2* 02/16/2014   PLT 184 02/16/2014   GLUCOSE 168* 02/16/2014   CHOL 55 02/06/2014   TRIG 115 01/26/2014   HDL 20* 01/26/2014   LDLCALC 39 01/26/2014   ALT 13 02/16/2014   AST 23 02/16/2014   NA 135* 02/16/2014   K 3.8 02/16/2014   CL 96 02/16/2014   CREATININE 1.35* 02/16/2014   BUN 28* 02/16/2014   CO2 27 02/16/2014   TSH 6.630* 02/11/2014   INR 1.73* 01/26/2014   HGBA1C 8.4* 01/26/2014    Now on heparin to prevent clotting of cvvh  Delight Ovens MD  [facility]eeper [phone] Office [phone] 02/16/2014 5:01 PM

## 2014-02-16 NOTE — Progress Notes (Signed)
22 Days Post-Op  Subjective: Pt intermittently FC; not speaking per nurse  Objective: Vital signs in last 24 hours: Temp:  [96.2 F (35.7 C)-98.2 F (36.8 C)] 98.2 F (36.8 C) (08/03 1200) Pulse Rate:  [42-125] 125 (08/03 1200) Resp:  [14-20] 20 (08/03 1200) BP: (86-125)/(43-65) 108/56 mmHg (08/03 1200) SpO2:  [98 %-100 %] 98 % (08/03 1200) Arterial Line BP: (64-110)/(46-71) 79/59 mmHg (08/03 1200) Weight:  [210 lb 8.6 oz (95.5 kg)] 210 lb 8.6 oz (95.5 kg) (08/03 0700) Last BM Date: 02/16/14  Intake/Output from previous day: 08/02 0701 - 08/03 0700 In: 2054 [I.V.:574; NG/GT:1250; IV Piggyback:200] Out: 4464 [Urine:25; Stool:600; Chest Tube:30] Intake/Output this shift: Total I/O In: 1040.5 [I.V.:112.5; Blood:350; NG/GT:360; IV Piggyback:218] Out: 1338 [Other:1338]  Left chest drain with min output; no sizable effusion on left per CXR, persistent rt eff  Lab Results:   Recent Labs  02/15/14 0400 02/16/14 0400  WBC 10.0 10.2  HGB 7.8* 7.6*  HCT 25.6* 25.2*  PLT 190 184   BMET  Recent Labs  02/15/14 1551 02/16/14 0400  NA 137 139  K 3.5* 3.9  CL 102 102  CO2 24 26  GLUCOSE 120* 117*  BUN 27* 26*  CREATININE 1.20* 1.25*  CALCIUM 7.6* 8.1*   PT/INR No results found for this basename: LABPROT, INR,  in the last 72 hours ABG  Recent Labs  02/14/14 1649  PHART 7.708*  HCO3 28.9*    Studies/Results: Dg Chest Port 1 View  02/16/2014   CLINICAL DATA:  Followup effusions  EXAM: PORTABLE CHEST - 1 VIEW  COMPARISON:  02/14/2014  FINDINGS: Postsurgical changes are again seen. The cardiac shadow is stable. A feeding catheter, right-sided PICC line and left-sided central venous catheter are again seen and stable. A left-sided pleural drain is again noted. No pneumothorax or sizable pleural effusion is noted on the left. Persistent right-sided pleural effusion is seen. Mild vascular congestion remains.  IMPRESSION: Persistent right-sided effusion stable from the prior  exam. Persistent vascular congestion is noted as well.  Persistent left pleural drain in satisfactory position.  Tubes and lines as described.   Electronically Signed   By: Alcide Clever M.D.   On: 02/16/2014 07:22    Anti-infectives: Anti-infectives   Start     Dose/Rate Route Frequency Ordered Stop   02/11/14 2300  vancomycin (VANCOCIN) IVPB 1000 mg/200 mL premix  Status:  Discontinued     1,000 mg 200 mL/hr over 60 Minutes Intravenous Every 24 hours 02/11/14 1013 02/12/14 0909   02/10/14 1000  cefTAZidime (FORTAZ) 1 g in dextrose 5 % 50 mL IVPB  Status:  Discontinued     1 g 100 mL/hr over 30 Minutes Intravenous Every 24 hours 02/09/14 1119 02/10/14 0810   02/10/14 1000  piperacillin-tazobactam (ZOSYN) IVPB 3.375 g  Status:  Discontinued     3.375 g 12.5 mL/hr over 240 Minutes Intravenous Every 12 hours 02/10/14 0809 02/10/14 0830   02/10/14 0930  fluconazole (DIFLUCAN) IVPB 100 mg  Status:  Discontinued     100 mg 50 mL/hr over 60 Minutes Intravenous Every 24 hours 02/10/14 0809 02/11/14 0841   02/10/14 0930  piperacillin-tazobactam (ZOSYN) IVPB 2.25 g     2.25 g 100 mL/hr over 30 Minutes Intravenous Every 6 hours 02/10/14 0832     02/09/14 2030  vancomycin (VANCOCIN) 1,500 mg in sodium chloride 0.9 % 500 mL IVPB  Status:  Discontinued     1,500 mg 250 mL/hr over 120 Minutes Intravenous Every 48  hours 02/09/14 1914 02/11/14 1013   02/07/14 0100  cefTAZidime (FORTAZ) 1 g in dextrose 5 % 50 mL IVPB  Status:  Discontinued     1 g 100 mL/hr over 30 Minutes Intravenous Every 12 hours 02/06/14 1502 02/09/14 1119   01/28/14 1400  cefTAZidime (FORTAZ) 1 g in dextrose 5 % 50 mL IVPB  Status:  Discontinued     1 g 100 mL/hr over 30 Minutes Intravenous 3 times per day 01/28/14 0950 02/06/14 1502   01/27/14 0045  vancomycin (VANCOCIN) IVPB 750 mg/150 ml premix     750 mg 150 mL/hr over 60 Minutes Intravenous Every 12 hours 01/26/14 1242 01/27/14 1356   01/26/14 1215  vancomycin (VANCOCIN)  IVPB 1000 mg/200 mL premix     1,000 mg 200 mL/hr over 60 Minutes Intravenous  Once 01/26/14 1015 01/26/14 1442   01/26/14 1030  cefUROXime (ZINACEF) 1.5 g in dextrose 5 % 50 mL IVPB     1.5 g 100 mL/hr over 30 Minutes Intravenous Every 12 hours 01/26/14 1015 01/27/14 2323   01/25/14 2100  vancomycin (VANCOCIN) 1,250 mg in sodium chloride 0.9 % 250 mL IVPB  Status:  Discontinued     1,250 mg 166.7 mL/hr over 90 Minutes Intravenous To Surgery 01/25/14 2057 01/25/14 2142   01/25/14 2100  cefUROXime (ZINACEF) 1.5 g in dextrose 5 % 50 mL IVPB  Status:  Discontinued     1.5 g 100 mL/hr over 30 Minutes Intravenous To Surgery 01/25/14 2057 01/25/14 2142   01/25/14 2100  cefUROXime (ZINACEF) 750 mg in dextrose 5 % 50 mL IVPB  Status:  Discontinued     750 mg 100 mL/hr over 30 Minutes Intravenous To Surgery 01/25/14 2057 01/25/14 2142      Assessment/Plan: s/p Procedure(s): CORONARY ARTERY BYPASS GRAFTING TIMES FOUR USING LEFT INTERNAL MAMMARY ARTERY TO LAD, SAPHENOUS VEIN GRAFTS TO OM1, OM2, AND PDA (N/A) MITRAL VALVE (MV) REPLACEMENT (N/A) S/p left chest drain 7/27 for symptomatic effusion; plans as per TCTS  LOS: 22 days    Gissella Niblack,D [facility] 02/16/2014

## 2014-02-16 NOTE — Progress Notes (Signed)
PT Cancellation Note  Patient Details Name: [name] MRN: [id number] DOB: May 07, 1941   Cancelled Treatment:    Reason Eval/Treat Not Completed: Medical issues which prohibited therapy.  Received new order however pt still on CRRT as well as levophed.  Nursing asked PT to check back tomorrow as hopeful that meds will be decr as well as CRRT discontinued soon.  Thanks.   INGOLD,Bobbi Yount 02/16/2014, 10:50 AM [facility] [phone] (pager)

## 2014-02-16 NOTE — Progress Notes (Signed)
ANTIBIOTIC CONSULT NOTE - FOLLOW UP  Pharmacy Consult for  zosyn Indication: PNA  No Known Allergies  Patient Measurements: Height: 5\' 7"  (170.2 cm) Weight: 210 lb 8.6 oz (95.5 kg) IBW/kg (Calculated) : 61.6  Vital Signs: Temp: 98.3 F (36.8 C) (08/03 1315) BP: 98/58 mmHg (08/03 1300) Pulse Rate: 110 (08/03 1315) Intake/Output from previous day: 08/02 0701 - 08/03 0700 In: 2054 [I.V.:574; NG/GT:1250; IV Piggyback:200] Out: 4464 [Urine:25; Stool:600; Chest Tube:30] Intake/Output from this shift: Total I/O In: 1177 [I.V.:137; Blood:350; NG/GT:430; IV Piggyback:260] Out: 1693 [Other:1693]  Labs:  Recent Labs  02/14/14 0352  02/15/14 0400 02/15/14 1551 02/16/14 0400  WBC 9.2  --  10.0  --  10.2  HGB 7.6*  --  7.8*  --  7.6*  PLT 209  --  190  --  184  CREATININE 1.29*  < > 1.27* 1.20* 1.25*  < > = values in this interval not displayed. Estimated Creatinine Clearance: 46.9 ml/min (by C-G formula based on Cr of 1.25). No results found for this basename: VANCOTROUGH, Leodis Binet, VANCORANDOM, GENTTROUGH, GENTPEAK, GENTRANDOM, TOBRATROUGH, TOBRAPEAK, TOBRARND, AMIKACINPEAK, AMIKACINTROU, AMIKACIN,  in the last 72 hours   Microbiology: Recent Results (from the past 720 hour(s))  MRSA PCR SCREENING     Status: None   Collection Time    01/26/14 11:10 AM      Result Value Ref Range Status   MRSA by PCR NEGATIVE  NEGATIVE Final   Comment:            The GeneXpert MRSA Assay (FDA     approved for NASAL specimens     only), is one component of a     comprehensive MRSA colonization     surveillance program. It is not     intended to diagnose MRSA     infection nor to guide or     monitor treatment for     MRSA infections.  CULTURE, RESPIRATORY (NON-EXPECTORATED)     Status: None   Collection Time    01/27/14  9:53 AM      Result Value Ref Range Status   Specimen Description TRACHEAL ASPIRATE   Final   Special Requests Normal   Final   Gram Stain     Final   Value: FEW  WBC PRESENT,BOTH PMN AND MONONUCLEAR     NO SQUAMOUS EPITHELIAL CELLS SEEN     NO ORGANISMS SEEN     Performed at [facility]   Culture     Final   Value: NO GROWTH 2 DAYS     Performed at [facility]   Report Status 01/29/2014 FINAL   Final  URINE CULTURE     Status: None   Collection Time    02/02/14 12:19 PM      Result Value Ref Range Status   Specimen Description URINE, CATHETERIZED   Final   Special Requests Normal   Final   Culture  Setup Time     Final   Value: 02/02/2014 19:42     Performed at [facility] Count     Final   Value: NO GROWTH     Performed at [facility]   Culture     Final   Value: NO GROWTH     Performed at [facility]   Report Status 02/03/2014 FINAL   Final  CLOSTRIDIUM DIFFICILE BY PCR     Status: None   Collection Time    02/05/14  5:18 PM      Result Value Ref Range Status   C difficile by pcr NEGATIVE  NEGATIVE Final  AFB CULTURE WITH SMEAR     Status: None   Collection Time    02/06/14  2:45 PM      Result Value Ref Range Status   Specimen Description FLUID PLEURAL   Final   Special Requests NONE   Final   Acid Fast Smear     Final   Value: NO ACID FAST BACILLI SEEN     Performed at [facility]   Culture     Final   Value: CULTURE WILL BE EXAMINED FOR 6 WEEKS BEFORE ISSUING A FINAL REPORT     Performed at [facility]   Report Status PENDING   Incomplete  BODY FLUID CULTURE     Status: None   Collection Time    02/06/14  2:45 PM      Result Value Ref Range Status   Specimen Description PLEURAL FLUID   Final   Special Requests NONE   Final   Gram Stain     Final   Value: NO WBC SEEN     NO ORGANISMS SEEN     Performed at [facility]   Culture     Final   Value: NO GROWTH 3 DAYS     Performed at [facility]   Report Status 02/10/2014 FINAL   Final  FUNGUS CULTURE W SMEAR     Status: None   Collection Time    02/06/14  2:46 PM      Result  Value Ref Range Status   Specimen Description FLUID PLEURAL   Final   Special Requests NONE   Final   Fungal Smear     Final   Value: NO YEAST OR FUNGAL ELEMENTS SEEN     Performed at [facility]   Culture     Final   Value: CULTURE IN PROGRESS FOR FOUR WEEKS     Performed at [facility]   Report Status PENDING   Incomplete  CULTURE, RESPIRATORY (NON-EXPECTORATED)     Status: None   Collection Time    02/09/14  8:27 AM      Result Value Ref Range Status   Specimen Description TRACHEAL ASPIRATE   Final   Special Requests NONE   Final   Gram Stain     Final   Value: FEW WBC PRESENT,BOTH PMN AND MONONUCLEAR     RARE SQUAMOUS EPITHELIAL CELLS PRESENT     MODERATE GRAM NEGATIVE RODS     FEW GRAM POSITIVE RODS     RARE GRAM POSITIVE COCCI IN PAIRS   Culture     Final   Value: Non-Pathogenic Oropharyngeal-type Flora Isolated.     Performed at [facility]   Report Status 02/11/2014 FINAL   Final  CULTURE, ROUTINE-ABSCESS     Status: None   Collection Time    02/09/14  4:08 PM      Result Value Ref Range Status   Specimen Description ABSCESS JP DRAINAGE   Final   Special Requests NONE   Final   Gram Stain     Final   Value: FEW WBC PRESENT,BOTH PMN AND MONONUCLEAR     NO SQUAMOUS EPITHELIAL CELLS SEEN     NO ORGANISMS SEEN     Performed at [facility]   Culture     Final   Value: MULTIPLE ORGANISMS PRESENT,  NONE PREDOMINANT     Note: NO STAPHYLOCOCCUS AUREUS ISOLATED NO GROUP A STREP (S.PYOGENES) ISOLATED     [facility]   Report Status 02/13/2014 FINAL   Final    Anti-infectives   Start     Dose/Rate Route Frequency Ordered Stop   02/11/14 2300  vancomycin (VANCOCIN) IVPB 1000 mg/200 mL premix  Status:  Discontinued     1,000 mg 200 mL/hr over 60 Minutes Intravenous Every 24 hours 02/11/14 1013 02/12/14 0909   02/10/14 1000  cefTAZidime (FORTAZ) 1 g in dextrose 5 % 50 mL IVPB  Status:  Discontinued     1 g 100 mL/hr  over 30 Minutes Intravenous Every 24 hours 02/09/14 1119 02/10/14 0810   02/10/14 1000  piperacillin-tazobactam (ZOSYN) IVPB 3.375 g  Status:  Discontinued     3.375 g 12.5 mL/hr over 240 Minutes Intravenous Every 12 hours 02/10/14 0809 02/10/14 0830   02/10/14 0930  fluconazole (DIFLUCAN) IVPB 100 mg  Status:  Discontinued     100 mg 50 mL/hr over 60 Minutes Intravenous Every 24 hours 02/10/14 0809 02/11/14 0841   02/10/14 0930  piperacillin-tazobactam (ZOSYN) IVPB 2.25 g     2.25 g 100 mL/hr over 30 Minutes Intravenous Every 6 hours 02/10/14 0832     02/09/14 2030  vancomycin (VANCOCIN) 1,500 mg in sodium chloride 0.9 % 500 mL IVPB  Status:  Discontinued     1,500 mg 250 mL/hr over 120 Minutes Intravenous Every 48 hours 02/09/14 1914 02/11/14 1013   02/07/14 0100  cefTAZidime (FORTAZ) 1 g in dextrose 5 % 50 mL IVPB  Status:  Discontinued     1 g 100 mL/hr over 30 Minutes Intravenous Every 12 hours 02/06/14 1502 02/09/14 1119   01/28/14 1400  cefTAZidime (FORTAZ) 1 g in dextrose 5 % 50 mL IVPB  Status:  Discontinued     1 g 100 mL/hr over 30 Minutes Intravenous 3 times per day 01/28/14 0950 02/06/14 1502   01/27/14 0045  vancomycin (VANCOCIN) IVPB 750 mg/150 ml premix     750 mg 150 mL/hr over 60 Minutes Intravenous Every 12 hours 01/26/14 1242 01/27/14 1356   01/26/14 1215  vancomycin (VANCOCIN) IVPB 1000 mg/200 mL premix     1,000 mg 200 mL/hr over 60 Minutes Intravenous  Once 01/26/14 1015 01/26/14 1442   01/26/14 1030  cefUROXime (ZINACEF) 1.5 g in dextrose 5 % 50 mL IVPB     1.5 g 100 mL/hr over 30 Minutes Intravenous Every 12 hours 01/26/14 1015 01/27/14 2323   01/25/14 2100  vancomycin (VANCOCIN) 1,250 mg in sodium chloride 0.9 % 250 mL IVPB  Status:  Discontinued     1,250 mg 166.7 mL/hr over 90 Minutes Intravenous To Surgery 01/25/14 2057 01/25/14 2142   01/25/14 2100  cefUROXime (ZINACEF) 1.5 g in dextrose 5 % 50 mL IVPB  Status:  Discontinued     1.5 g 100 mL/hr over 30  Minutes Intravenous To Surgery 01/25/14 2057 01/25/14 2142   01/25/14 2100  cefUROXime (ZINACEF) 750 mg in dextrose 5 % 50 mL IVPB  Status:  Discontinued     750 mg 100 mL/hr over 30 Minutes Intravenous To Surgery 01/25/14 2057 01/25/14 2142      Assessment: 73 yo female s/p CABG and MVR on Zosyn D7 for suspected HCAP.  Patient also noted on CRRT. WBC= 10.2, afeb, and SCr= 1.25.  Elita QuickFortaz 7/15>>7/27 Vanc 7/27>> 7/30 Pip/Tazo 7/28>>  7/16 TA>>neg FINAL 7/20 Urine - NEG FINAL  7/13 MRSA - NEG 7/23 cdiff>> neg FINAL 7/24 pleural fluid>> neg 7/24 fungal>>ngtd 7/27 resp>> non-path flora 7/27 abscess>> multiple org  Goal of Therapy:  Vancomycin trough level 15-20 mcg/ml  Plan:  -Could consider d/c zosyn today (noted plans for 7 days treatment)? -Will follow renal function and clinical progress  Harland German, Pharm D 02/16/2014 1:17 PM

## 2014-02-16 NOTE — Progress Notes (Signed)
[facility] Management.  Still on levophed and milrinone for pressure support.  Pulling 100cc/hr. PO4 low so will replace.  Will add daily Mg levels to labs.  ? If can use heparin, will discuss with surgeon.

## 2014-02-16 NOTE — Progress Notes (Signed)
Collins PA aware of pt also on SQ heparin.

## 2014-02-16 NOTE — Progress Notes (Signed)
Discussed with Thomasena Edis PA adding heparin per CRRT per request of Dr. Briant Cedar, she discussed with Dr. Tyrone Sage. OK to use heparin infusion for CRRT therapy

## 2014-02-17 ENCOUNTER — Inpatient Hospital Stay (HOSPITAL_COMMUNITY): Payer: Medicare Other

## 2014-02-17 DIAGNOSIS — R570 Cardiogenic shock: Secondary | ICD-10-CM | POA: Diagnosis present

## 2014-02-17 DIAGNOSIS — I369 Nonrheumatic tricuspid valve disorder, unspecified: Secondary | ICD-10-CM

## 2014-02-17 LAB — CBC
HCT: 27.5 % — ABNORMAL LOW (ref 36.0–46.0)
Hemoglobin: 8.4 g/dL — ABNORMAL LOW (ref 12.0–15.0)
MCH: 29.3 pg (ref 26.0–34.0)
MCHC: 30.5 g/dL (ref 30.0–36.0)
MCV: 95.8 fL (ref 78.0–100.0)
Platelets: 185 10*3/uL (ref 150–400)
RBC: 2.87 MIL/uL — ABNORMAL LOW (ref 3.87–5.11)
RDW: 18.9 % — ABNORMAL HIGH (ref 11.5–15.5)
WBC: 10.7 10*3/uL — ABNORMAL HIGH (ref 4.0–10.5)

## 2014-02-17 LAB — RENAL FUNCTION PANEL
ANION GAP: 14 (ref 5–15)
Albumin: 2.4 g/dL — ABNORMAL LOW (ref 3.5–5.2)
BUN: 27 mg/dL — ABNORMAL HIGH (ref 6–23)
CALCIUM: 7.7 mg/dL — AB (ref 8.4–10.5)
CO2: 23 mEq/L (ref 19–32)
Chloride: 93 mEq/L — ABNORMAL LOW (ref 96–112)
Creatinine, Ser: 1.06 mg/dL (ref 0.50–1.10)
GFR calc non Af Amer: 50 mL/min — ABNORMAL LOW (ref >90)
GFR, EST AFRICAN AMERICAN: 58 mL/min — AB (ref >90)
GLUCOSE: 380 mg/dL — AB (ref 70–99)
POTASSIUM: 6.2 meq/L — AB (ref 3.7–5.3)
Phosphorus: 7.9 mg/dL — ABNORMAL HIGH (ref 2.3–4.6)
Sodium: 130 mEq/L — ABNORMAL LOW (ref 137–147)

## 2014-02-17 LAB — COMPREHENSIVE METABOLIC PANEL
ALT: 16 U/L (ref 0–35)
AST: 30 U/L (ref 0–37)
Albumin: 2.6 g/dL — ABNORMAL LOW (ref 3.5–5.2)
Alkaline Phosphatase: 114 U/L (ref 39–117)
Anion gap: 12 (ref 5–15)
BUN: 26 mg/dL — ABNORMAL HIGH (ref 6–23)
CO2: 25 mEq/L (ref 19–32)
Calcium: 8.2 mg/dL — ABNORMAL LOW (ref 8.4–10.5)
Chloride: 100 mEq/L (ref 96–112)
Creatinine, Ser: 1.21 mg/dL — ABNORMAL HIGH (ref 0.50–1.10)
GFR calc Af Amer: 50 mL/min — ABNORMAL LOW (ref >90)
GFR calc non Af Amer: 43 mL/min — ABNORMAL LOW (ref >90)
Glucose, Bld: 100 mg/dL — ABNORMAL HIGH (ref 70–99)
Potassium: 3.9 mEq/L (ref 3.7–5.3)
Sodium: 137 mEq/L (ref 137–147)
Total Bilirubin: 0.8 mg/dL (ref 0.3–1.2)
Total Protein: 7.5 g/dL (ref 6.0–8.3)

## 2014-02-17 LAB — GLUCOSE, CAPILLARY
Glucose-Capillary: 119 mg/dL — ABNORMAL HIGH (ref 70–99)
Glucose-Capillary: 171 mg/dL — ABNORMAL HIGH (ref 70–99)
Glucose-Capillary: 192 mg/dL — ABNORMAL HIGH (ref 70–99)
Glucose-Capillary: 194 mg/dL — ABNORMAL HIGH (ref 70–99)
Glucose-Capillary: 231 mg/dL — ABNORMAL HIGH (ref 70–99)
Glucose-Capillary: 97 mg/dL (ref 70–99)

## 2014-02-17 LAB — POCT I-STAT 3, ART BLOOD GAS (G3+)
Acid-Base Excess: 3 mmol/L — ABNORMAL HIGH (ref 0.0–2.0)
Bicarbonate: 26.6 mEq/L — ABNORMAL HIGH (ref 20.0–24.0)
O2 Saturation: 96 %
TCO2: 28 mmol/L (ref 0–100)
pCO2 arterial: 37.2 mmHg (ref 35.0–45.0)
pH, Arterial: 7.463 — ABNORMAL HIGH (ref 7.350–7.450)
pO2, Arterial: 78 mmHg — ABNORMAL LOW (ref 80.0–100.0)

## 2014-02-17 LAB — CARBOXYHEMOGLOBIN
Carboxyhemoglobin: 1.8 % — ABNORMAL HIGH (ref 0.5–1.5)
Methemoglobin: 0.8 % (ref 0.0–1.5)
O2 Saturation: 49.8 %
Total hemoglobin: 8.6 g/dL — ABNORMAL LOW (ref 12.0–16.0)

## 2014-02-17 LAB — POCT ACTIVATED CLOTTING TIME
Activated Clotting Time: 185 seconds
Activated Clotting Time: 191 seconds
Activated Clotting Time: 197 seconds
Activated Clotting Time: 202 seconds
Activated Clotting Time: 163 seconds
Activated Clotting Time: 163 seconds
Activated Clotting Time: 163 seconds
Activated Clotting Time: 163 seconds
Activated Clotting Time: 168 seconds
Activated Clotting Time: 168 seconds
Activated Clotting Time: 168 seconds
Activated Clotting Time: 174 seconds
Activated Clotting Time: 174 seconds
Activated Clotting Time: 174 seconds
Activated Clotting Time: 180 seconds
Activated Clotting Time: 197 seconds
Activated Clotting Time: 208 seconds
Activated Clotting Time: 208 seconds

## 2014-02-17 LAB — TYPE AND SCREEN
ABO/RH(D): A POS
Antibody Screen: NEGATIVE
Unit division: 0

## 2014-02-17 LAB — MAGNESIUM: Magnesium: 2.6 mg/dL — ABNORMAL HIGH (ref 1.5–2.5)

## 2014-02-17 LAB — APTT: aPTT: 113 seconds — ABNORMAL HIGH (ref 24–37)

## 2014-02-17 LAB — PHOSPHORUS: Phosphorus: 2 mg/dL — ABNORMAL LOW (ref 2.3–4.6)

## 2014-02-17 LAB — POTASSIUM: POTASSIUM: 3.8 meq/L (ref 3.7–5.3)

## 2014-02-17 MED ORDER — AMIODARONE HCL IN DEXTROSE 360-4.14 MG/200ML-% IV SOLN
30.0000 mg/h | INTRAVENOUS | Status: DC
  Administered 2014-02-17 – 2014-02-18 (×4): 30 mg/h via INTRAVENOUS
  Filled 2014-02-17 (×5): qty 200

## 2014-02-17 MED ORDER — PRO-STAT SUGAR FREE PO LIQD
30.0000 mL | Freq: Four times a day (QID) | ORAL | Status: DC
  Administered 2014-02-17: 17:00:00
  Administered 2014-02-17 – 2014-03-01 (×44): 30 mL
  Filled 2014-02-17 (×54): qty 30

## 2014-02-17 MED ORDER — DILTIAZEM HCL 100 MG IV SOLR
5.0000 mg/h | INTRAVENOUS | Status: DC
  Administered 2014-02-17: 5 mg/h via INTRAVENOUS
  Filled 2014-02-17: qty 100

## 2014-02-17 MED ORDER — AMIODARONE HCL IN DEXTROSE 360-4.14 MG/200ML-% IV SOLN
60.0000 mg/h | INTRAVENOUS | Status: AC
  Administered 2014-02-17: 150 mg/h via INTRAVENOUS
  Administered 2014-02-17 (×2): 60 mg/h via INTRAVENOUS
  Filled 2014-02-17: qty 400

## 2014-02-17 MED ORDER — POTASSIUM PHOSPHATES 15 MMOLE/5ML IV SOLN
20.0000 mmol | Freq: Once | INTRAVENOUS | Status: AC
  Administered 2014-02-17: 20 mmol via INTRAVENOUS
  Filled 2014-02-17: qty 6.67

## 2014-02-17 MED ORDER — AMIODARONE LOAD VIA INFUSION
150.0000 mg | Freq: Once | INTRAVENOUS | Status: DC
  Filled 2014-02-17 (×2): qty 83.34

## 2014-02-17 MED ORDER — DILTIAZEM LOAD VIA INFUSION
5.0000 mg | Freq: Once | INTRAVENOUS | Status: AC
  Administered 2014-02-17: 5 mg via INTRAVENOUS
  Filled 2014-02-17: qty 5

## 2014-02-17 MED ORDER — NEPRO/CARBSTEADY PO LIQD
1000.0000 mL | ORAL | Status: DC
  Administered 2014-02-18 – 2014-02-25 (×7): 1000 mL
  Filled 2014-02-17 (×13): qty 1000

## 2014-02-17 NOTE — Progress Notes (Signed)
Spoke with Dr. Tyrone Sage regarding HR 100-130 afib RVR, confirmed with EKG. BP 108/67. 99% on 2L. CVP 16. Patient nonverbal at this time; unable to tell me if she is having chest pain or palpitations. Order received for IV Cardizem at 5mg /hr with a 5mg  IV bolus. Will administer per order and continue to closely monitor. Thresa Ross RN

## 2014-02-17 NOTE — Progress Notes (Signed)
Advanced Heart Failure Rounding Note   Subjective:    [name] is a 73 yo female with a history of CAD s/p PCI at [facility], HTN, TIA, DM2 and PAD. She presented to [facility] 01/25/14 with CP,Vtach and ST changes in inferior leads and was taken to the cath lab. She had severe multivessel CAD involving 100% distal RCA with tandem 90% & 80% lesions, proximal D1 100%, proximal LAD & AVGroove Circ 90% with 80% OM2 and was evaluated by Dr. Maren Beach for CABG and IABP was placed.  Underwent emergent CABGx4 and MV replacement (LIMA-LAD, SVG to OM1, SVG to OM2 and SVG to PDA) on 01/26/14. She was transferred to the ICU on multiple pressors and IABP. UOP was sluggish and remained volume overloaded and lasix gtt was started. IABP removed 7/17. TF started for nutrition. Had difficulty with thrombocytopenia which improved and HIT studies negative and Coumadin was restarted. Underwent thoracentesis on R on 02/06/14. Extubated 7/25 but re-intubated 7/27 electively for therapeutic bronchoscopy showing no significant mucus, airways clear w/ exception of what appeared to be suction trauma at the inlet of the RUL. Underwent L CT by IR on 7/27. Her Creatinine was stable for awhile and then started trending up even on milrinone. Underwent HD cath placement 7/28 and started on CVVHD on 7/29. Extubated again 02/13/14. Has had issues with Afib/Aflutter.   ECHO 02/02/14: EF 55-60%, grade III DD, RV normal.   Remains on CVVHD, milrinone, levophed and diltiazem. Will follow commands but does not talk. Moves all extremities. CVP 18. Co-ox 50%. Remains anuric.  Objective:   Weight Range:  Vital Signs:   Temp:  [97.3 F (36.3 C)-98.4 F (36.9 C)] 97.3 F (36.3 C) (08/04 1215) Pulse Rate:  [84-125] 84 (08/04 1215) Resp:  [16-27] 22 (08/04 1215) BP: (80-114)/(44-82) 106/58 mmHg (08/04 1215) SpO2:  [95 %-100 %] 98 % (08/04 1215) Arterial Line BP: (64-129)/(48-88) 113/58 mmHg (08/04 1100) Weight:  [205 lb 14.6 oz (93.4 kg)] 205 lb 14.6 oz  (93.4 kg) (08/04 0545) Last BM Date: 02/17/14  Weight change: Filed Weights   02/14/14 0500 02/16/14 0700 02/17/14 0545  Weight: 223 lb 8.7 oz (101.4 kg) 210 lb 8.6 oz (95.5 kg) 205 lb 14.6 oz (93.4 kg)    Intake/Output:   Intake/Output Summary (Last 24 hours) at 02/17/14 1228 Last data filed at 02/17/14 1200  Gross per 24 hour  Intake 2420.47 ml  Output   5041 ml  Net -2620.53 ml     Physical Exam: CVP 18 with prominent CV waves General:  Chronically ill appearing. No resp difficulty HEENT: normal; Panda intact; L IJ HD cath Neck: supple. JVP difficult to assess d/t body habiuts but appears elevated with CV waves . Carotids 2+ bilat; no bruits. No lymphadenopathy or thryomegaly appreciated. Cor: PMI nondisplaced. Regular rate & irregular rhythm. No rubs, gallops or murmurs. Lungs: Diminished in the bases Abdomen: soft, nontender, nondistended. No hepatosplenomegaly. No bruits or masses. Good bowel sounds. Extremities: no cyanosis, clubbing, rash, 1-2+ bilateral edema; R 3L PICC Neuro: alert. Follows commands. Won't talk cranial nerves grossly intact. moves all 4 extremities w/o difficulty. Affect flat   Telemetry: Afib/flutter 80-90s  Labs: Basic Metabolic Panel:  Recent Labs Lab 02/14/14 1536 02/15/14 0400 02/15/14 1551 02/16/14 0400 02/16/14 1500 02/17/14 0400  NA 135* 138 137 139 135* 137  K 3.5* 3.8 3.5* 3.9 3.8 3.9  CL 100 98 102 102 96 100  CO2 26 26 24 26 27 25   GLUCOSE 141* 176* 120* 117*  168* 100*  BUN 30* 28* 27* 26* 28* 26*  CREATININE 1.32* 1.27* 1.20* 1.25* 1.35* 1.21*  CALCIUM 7.9* 8.0* 7.6* 8.1* 8.1* 8.2*  MG  --   --   --  2.5  --  2.6*  PHOS 2.3 2.5 2.2*  --  2.9 2.0*    Liver Function Tests:  Recent Labs Lab 02/13/14 0440  02/14/14 0352  02/15/14 0400 02/15/14 1551 02/16/14 0400 02/16/14 1500 02/17/14 0400  AST 30  --  18  --  19  --  23  --  30  ALT 16  --  13  --  14  --  13  --  16  ALKPHOS 93  --  83  --  88  --  92  --  114   BILITOT 0.7  --  0.7  --  0.6  --  0.6  --  0.8  PROT 6.5  --  7.0  --  7.3  --  7.4  --  7.5  ALBUMIN 2.3*  2.3*  < > 2.3*  < > 2.4* 2.2* 2.4* 2.4* 2.6*  < > = values in this interval not displayed. No results found for this basename: LIPASE, AMYLASE,  in the last 168 hours No results found for this basename: AMMONIA,  in the last 168 hours  CBC:  Recent Labs Lab 02/13/14 0440 02/14/14 0352 02/15/14 0400 02/16/14 0400 02/17/14 0400  WBC 11.7* 9.2 10.0 10.2 10.7*  HGB 8.0* 7.6* 7.8* 7.6* 8.4*  HCT 25.2* 24.5* 25.6* 25.2* 27.5*  MCV 95.5 96.8 98.1 98.4 95.8  PLT 261 209 190 184 185    Cardiac Enzymes: No results found for this basename: CKTOTAL, CKMB, CKMBINDEX, TROPONINI,  in the last 168 hours  BNP: BNP (last 3 results) No results found for this basename: PROBNP,  in the last 8760 hours   Other results:  EKG: AF  Imaging: Dg Chest 1 View  02/17/2014   CLINICAL DATA:  Left-sided chest tube removal.  EXAM: CHEST - 1 VIEW  COMPARISON:  One-view chest 02/17/2014.  FINDINGS: The heart is enlarged. The left-sided chest tube has been removed. There is no pneumothorax. A left IJ catheter is stable in position. A right-sided PICC line is stable in position. The NG tube courses off the inferior border the film. There is interval increase in diffuse interstitial edema. Bilateral pleural effusions are noted.  IMPRESSION: 1. Interval removal of left-sided chest tube without pneumothorax. 2. The support apparatus is otherwise stable. 3. Interval increase in diffuse interstitial edema.   Electronically Signed   By: Gennette Pachris  Mattern M.D.   On: 02/17/2014 12:19   Dg Chest Portable 1 View  02/17/2014   CLINICAL DATA:  Respiratory failure.  EXAM: PORTABLE CHEST - 1 VIEW  COMPARISON:  02/16/2014, 02/13/2014  FINDINGS: Left IJ catheter, NG tube, PICC line in stable position. Left chest tube in stable position. Cardiomegaly with pulmonary vascular congestion. Prior CABG. Mild bilateral alveolar  infiltrates are present. Bilateral pleural effusion present. These findings suggest congestive heart failure. Left lower lobe atelectasis. No pneumothorax. No acute osseous abnormality.  IMPRESSION: 1. Line and tube position stable. Left chest tube in unchanged position. No pneumothorax. 2. Cardiomegaly. Prior CABG. Bilateral pulmonary infiltrates and pleural effusions suggest congestive heart failure with pulmonary edema. 3. Left lower lobe atelectasis.   Electronically Signed   By: Maisie Fushomas  Register   On: 02/17/2014 07:44   Dg Chest Port 1 View  02/16/2014   CLINICAL DATA:  Followup  effusions  EXAM: PORTABLE CHEST - 1 VIEW  COMPARISON:  02/14/2014  FINDINGS: Postsurgical changes are again seen. The cardiac shadow is stable. A feeding catheter, right-sided PICC line and left-sided central venous catheter are again seen and stable. A left-sided pleural drain is again noted. No pneumothorax or sizable pleural effusion is noted on the left. Persistent right-sided pleural effusion is seen. Mild vascular congestion remains.  IMPRESSION: Persistent right-sided effusion stable from the prior exam. Persistent vascular congestion is noted as well.  Persistent left pleural drain in satisfactory position.  Tubes and lines as described.   Electronically Signed   By: Alcide Clever M.D.   On: 02/16/2014 07:22      Medications:     Scheduled Medications: . aspirin  324 mg Per Tube Daily  . bisacodyl  10 mg Oral Daily   Or  . bisacodyl  10 mg Rectal Daily  . chlorhexidine  15 mL Mouth Rinse BID  . feeding supplement (NEPRO CARB STEADY)  1,000 mL Oral Q24H  . feeding supplement (PRO-STAT SUGAR FREE 64)  30 mL Per Tube 5 X Daily  . insulin aspart  0-24 Units Subcutaneous 6 times per day  . insulin glargine  25 Units Subcutaneous BID  . levalbuterol  0.63 mg Nebulization Q6H  . levothyroxine  75 mcg Per Tube QAC breakfast  . pantoprazole sodium  40 mg Per Tube Q1200  . potassium phosphate IVPB (mmol)  20 mmol  Intravenous Once  . sodium chloride  10-40 mL Intracatheter Q12H     Infusions: . sodium chloride 10 mL/hr at 02/16/14 1455  . sodium chloride 250 mL (02/15/14 0800)  . diltiazem (CARDIZEM) infusion 5 mg/hr (02/17/14 0020)  . heparin 10,000 units/ 20 mL infusion syringe 1,650 Units/hr (02/17/14 1200)  . milrinone 0.25 mcg/kg/min (02/17/14 1127)  . norepinephrine (LEVOPHED) Adult infusion 7 mcg/min (02/17/14 0630)  . dialysis replacement fluid (prismasate) 300 mL/hr at 02/17/14 0345  . dialysis replacement fluid (prismasate) 200 mL/hr at 02/16/14 1323  . dialysate (PRISMASATE) 2,000 mL/hr at 02/17/14 1120     PRN Medications:  acetaminophen, heparin, heparin, ondansetron (ZOFRAN) IV, oxyCODONE, sodium chloride, sodium chloride   Assessment:   1) CAD - s/p CABG x 4 with MV replacement 2) A/C respiratory failure 3) AKI - On CVVHD 4) Cardiogenic shock 5) Afib/A Flutter 6) L Pleural effusion - s/p L CT 7) Anemia 8) Delerium, acute 9) ? Dementia 10) HCAP   Plan/Discussion:    Length of Stay: 23 Aundria Rud 02/17/2014, 12:28 PM  Advanced Heart Failure Team Pager [phone] (M-F; 7a - 4p)  Please contact [facility] for night-coverage after hours (4p -7a ) and weekends on [email or web address]  Patient seen and examined with Ulla Potash, NP. We discussed all aspects of the encounter. I agree with the assessment and plan as stated above.   Difficult situation. She has persistent cardiogenic shock s/p inferior STEMI and CABG/MVR and remains on multiple pressors. However EF normal post-op on echo 2 weeks ago. She is also anuric and remains on CVVHD. (CVP 18 with prominent CV waves.) On exam she can follow commands but will not speak. Rhythm now AF/AFL on IV cardizem.   Unfortunately I think our options are very limited. Overall picture concerning for RV failure. Will repeat echo to re-assess RV and LV function as well as TV valve. Continue pressors and CVVHD for hemodynamic  supprort and volume removal. With low co-ox would stop diltiazem and restart IV amio. May benefit from DC-CV  if co-ox not improving. Ideally would like to start heparin if OK with TCTS.   I am not sure what is going on with her mental status. She seems to follow commands but will not speak. I think before we push too much harder we should have family meeting and develop clear goals of care.   We will follow.  Addendum: I spoke with her grandson Clide Cliff by phone today and he said prior to admit she did have some dementia but was very functional. No problems with speech. He confirmed that she would want FULL Code and would likely ne ok with outpatient HD if it was a matter of life and death.  The patient is critically ill with multiple organ systems failure and requires high complexity decision making for assessment and support, frequent evaluation and titration of therapies, application of advanced monitoring technologies and extensive interpretation of multiple databases.   Critical Care Time devoted to patient care services described in this note is 45 Minutes.   Cherica Heiden,MD 1:45 PM

## 2014-02-17 NOTE — Progress Notes (Addendum)
TCTS DAILY ICU PROGRESS NOTE                   [address]            [place] [id number]          [phone]   23 Days Post-Op Procedure(s) (LRB): CORONARY ARTERY BYPASS GRAFTING TIMES FOUR USING LEFT INTERNAL MAMMARY ARTERY TO LAD, SAPHENOUS VEIN GRAFTS TO OM1, OM2, AND PDA (N/A) MITRAL VALVE (MV) REPLACEMENT (N/A)  Total Length of Stay:  LOS: 23 days   Subjective: Awake and alert.  Follow some commands.  Spoke a few words to RN this morning, but not verbal with me. Back in AF overnight and on Cardizem gtt.   Objective: Vital signs in last 24 hours: Temp:  [96.2 F (35.7 C)-98.4 F (36.9 C)] 98 F (36.7 C) (08/04 0745) Pulse Rate:  [42-125] 105 (08/04 0730) Cardiac Rhythm:  [-] Atrial fibrillation (08/04 0743) Resp:  [16-25] 20 (08/04 0745) BP: (80-125)/(43-81) 99/50 mmHg (08/04 0730) SpO2:  [95 %-100 %] 98 % (08/04 0730) Arterial Line BP: (64-129)/(48-88) 116/61 mmHg (08/04 0745) Weight:  [205 lb 14.6 oz (93.4 kg)] 205 lb 14.6 oz (93.4 kg) (08/04 0545)  Filed Weights   02/14/14 0500 02/16/14 0700 02/17/14 0545  Weight: 223 lb 8.7 oz (101.4 kg) 210 lb 8.6 oz (95.5 kg) 205 lb 14.6 oz (93.4 kg)    Weight change: -4 lb 10.1 oz (-2.1 kg)   Hemodynamic parameters for last 24 hours: CVP:  [9 mmHg-22 mmHg] 10 mmHg  Intake/Output from previous day: 08/03 0701 - 08/04 0700 In: 2721.5 [I.V.:679.5; Blood:350; NG/GT:1240; IV Piggyback:452] Out: 5207 [Urine:10; Stool:500]  CBGs [id number]     Current Meds: Scheduled Meds: . aspirin  324 mg Per Tube Daily  . bisacodyl  10 mg Oral Daily   Or  . bisacodyl  10 mg Rectal Daily  . chlorhexidine  15 mL Mouth Rinse BID  . feeding supplement (NEPRO CARB STEADY)  1,000 mL Oral Q24H  . feeding supplement (PRO-STAT SUGAR FREE 64)  30 mL Per Tube 5 X Daily  . insulin aspart  0-24 Units Subcutaneous 6 times per day  . insulin glargine  25 Units Subcutaneous BID  . levalbuterol  0.63 mg Nebulization Q6H  .  levothyroxine  75 mcg Per Tube QAC breakfast  . pantoprazole sodium  40 mg Per Tube Q1200  . piperacillin-tazobactam (ZOSYN)  IV  2.25 g Intravenous Q6H  . sodium chloride  10-40 mL Intracatheter Q12H   Continuous Infusions: . sodium chloride 10 mL/hr at 02/16/14 1455  . sodium chloride 250 mL (02/15/14 0800)  . diltiazem (CARDIZEM) infusion 5 mg/hr (02/17/14 0020)  . heparin 10,000 units/ 20 mL infusion syringe 1,600 Units/hr (02/17/14 0723)  . milrinone 0.25 mcg/kg/min (02/16/14 2200)  . norepinephrine (LEVOPHED) Adult infusion 7 mcg/min (02/17/14 0630)  . dialysis replacement fluid (prismasate) 300 mL/hr at 02/17/14 0345  . dialysis replacement fluid (prismasate) 200 mL/hr at 02/16/14 1323  . dialysate (PRISMASATE) 2,000 mL/hr at 02/17/14 0624   PRN Meds:.acetaminophen, heparin, heparin, ondansetron (ZOFRAN) IV, oxyCODONE, sodium chloride, sodium chloride  Physical Exam: General appearance: alert, no distress and follows some commands Heart: irregularly irregular rhythm Lungs: Slightly diminished BS in bases bilaterally, overall clear Abdomen: soft, non-tender; bowel sounds normal; no masses,  no organomegaly Extremities: +LE edema Wound: Clean and dry   Lab Results: CBC: Recent Labs  02/16/14 0400 02/17/14 0400  WBC 10.2 10.7*  HGB 7.6* 8.4*  HCT 25.2* 27.5*  PLT 184 185   BMET:  Recent Labs  02/16/14 1500 02/17/14 0400  NA 135* 137  K 3.8 3.9  CL 96 100  CO2 27 25  GLUCOSE 168* 100*  BUN 28* 26*  CREATININE 1.35* 1.21*  CALCIUM 8.1* 8.2*    PT/INR: No results found for this basename: LABPROT, INR,  in the last 72 hours Radiology: Dg Chest Portable 1 View  02/17/2014   CLINICAL DATA:  Respiratory failure.  EXAM: PORTABLE CHEST - 1 VIEW  COMPARISON:  02/16/2014, 02/13/2014  FINDINGS: Left IJ catheter, NG tube, PICC line in stable position. Left chest tube in stable position. Cardiomegaly with pulmonary vascular congestion. Prior CABG. Mild bilateral alveolar  infiltrates are present. Bilateral pleural effusion present. These findings suggest congestive heart failure. Left lower lobe atelectasis. No pneumothorax. No acute osseous abnormality.  IMPRESSION: 1. Line and tube position stable. Left chest tube in unchanged position. No pneumothorax. 2. Cardiomegaly. Prior CABG. Bilateral pulmonary infiltrates and pleural effusions suggest congestive heart failure with pulmonary edema. 3. Left lower lobe atelectasis.   Electronically Signed   By: Maisie Fus  Register   On: 02/17/2014 07:44   Dg Chest Port 1 View  02/16/2014   CLINICAL DATA:  Followup effusions  EXAM: PORTABLE CHEST - 1 VIEW  COMPARISON:  02/14/2014  FINDINGS: Postsurgical changes are again seen. The cardiac shadow is stable. A feeding catheter, right-sided PICC line and left-sided central venous catheter are again seen and stable. A left-sided pleural drain is again noted. No pneumothorax or sizable pleural effusion is noted on the left. Persistent right-sided pleural effusion is seen. Mild vascular congestion remains.  IMPRESSION: Persistent right-sided effusion stable from the prior exam. Persistent vascular congestion is noted as well.  Persistent left pleural drain in satisfactory position.  Tubes and lines as described.   Electronically Signed   By: Alcide Clever M.D.   On: 02/16/2014 07:22     Assessment/Plan: S/P Procedure(s) (LRB): CORONARY ARTERY BYPASS GRAFTING TIMES FOUR USING LEFT INTERNAL MAMMARY ARTERY TO LAD, SAPHENOUS VEIN GRAFTS TO OM1, OM2, AND PDA (N/A) MITRAL VALVE (MV) REPLACEMENT (N/A) CV- Back in AF overnight, rates 90-100 on Cardizem.  Remains on Milrinone, Levophed. BPs borderline. Co-ox 49.8 Pulm- stable on 2L O2.  L CT in place with scant output. Possibly can d/c CT soon.  AKI- Cr stabilizing, Continue CVVH per renal. Anemia- H/H stable following transfusion. Nutrition- Continue TFs.  ID- WBC stable, no fever. Zosyn D#7/7 for presumed pneumonia.  Deconditioning- mobilize as  tolerated. Disp- for LTAC evaluation.   COLLINS,GINA H 02/17/2014 7:54 AM  Back in afib, cordarone was stopped last week, started  on Cardizem  for rate control Remove ct today minimal drainage  Still on pressors  Echo on 7/20 EF 55%  I have seen and examined [name] and agree with the above assessment  and plan.  Delight Ovens MD Beeper [phone] Office [phone] 02/17/2014 8:39 AM

## 2014-02-17 NOTE — Progress Notes (Signed)
Successful removal of left chest tube. No immediate complications, sterile vaseline dressing placed. Post CXR ordered.   Pattricia Boss PA-C Interventional Radiology  02/17/14  9:50 AM

## 2014-02-17 NOTE — Progress Notes (Signed)
NUTRITION FOLLOW UP  INTERVENTION:  Continue Nepro formula at 40 ml/hr  Decrease Prostat liquid protein to 30 ml QID via tube  Total TF regimen to provide 2128 kcals, 138 gm protein, 501 ml of free water RD to follow for nutrition care plan  NUTRITION DIAGNOSIS: Inadequate oral intake related to inability to eat as evidenced by NPO status, ongoing  New Goal: Pt to meet >/= 90% of their estimated nutrition needs, met  Monitor:  TF regimen & tolerance, respiratory status, weight, labs, I/O's  ASSESSMENT: PMHx significant for CAD, TIA, HTN, DM2, PAD. Admitted with n/v, diaphoresis and chest pain. Work-up reveals STEMI.  Underwent L heart catheterization with coronary angiogram on 7/12. Work-up reveals severe multivessel CAD. Pt then went to OR for emergent CABG x 4 on 7/13.  Patient extubated 7/31.  Chest tube removed 8/4.  Patient continues on CVVHD and pressors.  Nepro formula currently infusing at 40 ml/hr via Panda tube with Prostat liquid protein 5 times daily providing 2228 total kcals,153 gm protein, 501 ml of free water.  RD to adjust TF regimen to better meet re-estimated needs.  Height: Ht Readings from Last 1 Encounters:  01/31/14 _0  (1.702 m)    Weight: Wt Readings from Last 1 Encounters:  02/17/14 205 lb 14.6 oz (93.4 kg)  01/25/14           180 lb (81.6 kg)  BMI:  Body mass index is 32.24 kg/(m^2).   Re-estimated Nutritional Needs: Kcal: 2000-2200 Protein: 125-135 gm Fluid: per MD  Skin: leg and chest incisions  Diet Order: NPO   Intake/Output Summary (Last 24 hours) at 02/17/14 1423 Last data filed at 02/17/14 1406  Gross per 24 hour  Intake 2626.07 ml  Output   4977 ml  Net -2350.93 ml    Labs:   Recent Labs Lab 02/15/14 1551 02/16/14 0400 02/16/14 1500 02/17/14 0400  NA 137 139 135* 137  K 3.5* 3.9 3.8 3.9  CL 102 102 96 100  CO2 _1 BUN 27* 26* 28* 26*  CREATININE 1.20* 1.25* 1.35* 1.21*  CALCIUM 7.6* 8.1* 8.1*  8.2*  MG  --  2.5  --  2.6*  PHOS 2.2*  --  2.9 2.0*  GLUCOSE 120* 117* 168* 100*    CBG (last 3)   Recent Labs  02/17/14 0350 02/17/14 0734 02/17/14 1135  GLUCAP 97 119* 194*    Scheduled Meds: . amiodarone  150 mg Intravenous Once  . aspirin  324 mg Per Tube Daily  . bisacodyl  10 mg Oral Daily   Or  . bisacodyl  10 mg Rectal Daily  . chlorhexidine  15 mL Mouth Rinse BID  . feeding supplement (NEPRO CARB STEADY)  1,000 mL Oral Q24H  . feeding supplement (PRO-STAT SUGAR FREE 64)  30 mL Per Tube 5 X Daily  . insulin aspart  0-24 Units Subcutaneous 6 times per day  . insulin glargine  25 Units Subcutaneous BID  . levalbuterol  0.63 mg Nebulization Q6H  . levothyroxine  75 mcg Per Tube QAC breakfast  . pantoprazole sodium  40 mg Per Tube Q1200  . potassium phosphate IVPB (mmol)  20 mmol Intravenous Once  . sodium chloride  10-40 mL Intracatheter Q12H    Continuous Infusions: . sodium chloride 10 mL/hr at 02/16/14 1455  . sodium chloride 250 mL (02/15/14 0800)  . amiodarone 150 mg/hr (02/17/14 1406)   Followed by  . amiodarone    . heparin 10,000  units/ 20 mL infusion syringe 1,650 Units/hr (02/17/14 1400)  . milrinone 0.25 mcg/kg/min (02/17/14 1127)  . norepinephrine (LEVOPHED) Adult infusion 7 mcg/min (02/17/14 0630)  . dialysis replacement fluid (prismasate) 300 mL/hr at 02/17/14 0345  . dialysis replacement fluid (prismasate) 200 mL/hr at 02/16/14 1323  . dialysate (PRISMASATE) 2,000 mL/hr at 02/17/14 1405    Past Medical History  Diagnosis Date  . History of MI (myocardial infarction)     PCI done @ [facility] (cannot get cath report on Care Everywhere(  . Essential hypertension   . TIA (transient ischemic attack)   . Thyroid disease   . Anxiety   . Diabetes mellitus type 2 with peripheral artery disease     On insulin  . PAD (peripheral artery disease)   . Hyperlipidemia associated with type 2 diabetes mellitus   . A-fib   . ARF (acute renal failure)      Past Surgical History  Procedure Laterality Date  . Cardiac catheterization      PCI - unknown vessel or stent; unknown date; @ [facility].  . Abdominal hysterectomy    . Cholecystectomy    . Coronary artery bypass graft N/A 01/25/2014    Procedure: CORONARY ARTERY BYPASS GRAFTING TIMES FOUR USING LEFT INTERNAL MAMMARY ARTERY TO LAD, SAPHENOUS VEIN GRAFTS TO OM1, OM2, AND PDA;  Surgeon: Ivin Poot, MD;  Location: [place];  Service: Open Heart Surgery;  Laterality: N/A;  . Mitral valve replacement N/A 01/25/2014    Procedure: MITRAL VALVE (MV) REPLACEMENT;  Surgeon: Ivin Poot, MD;  Location: [place];  Service: Open Heart Surgery;  Laterality: N/A;    Arthur Holms, RD, LDN Pager #: [phone] After-Hours Pager #: [phone]

## 2014-02-17 NOTE — Progress Notes (Signed)
PULMONARY / CRITICAL CARE MEDICINE  Name: [name] MRN: [id number] DOB: 05-02-1941    ADMISSION DATE:  01/25/2014 CONSULTATION DATE:  02/17/2014  REFERRING MD :  Tyrone Sage  CHIEF COMPLAINT:  Hypoxemia  INITIAL PRESENTATION: 73 yo admitted 7/12 with STEMI. Taken to cath lab but unable to perform PCI due to significant RCA disease. Taken to OR emergently for CABG x 4.  Returned to SICU mechanically ventilated.  Course c/b failed extubation, pleural effusion, renal failure on CRRT,    STUDIES / EVENTS:  7/12 Left heart cath: occlusion of RCA not amendable to PCI 7/13 CABG x 4 + MVR Zenaida Niece Trigt) 7/20 TTE:  EF 55%, no RWMA, grade 3 DD, PAP 41 torr 7/24 R thoracentesis 1,200 mL 7/25 Extubated 7/27 Left hemithorax opacification. Reintubated 7/27 FOB >>> no overt airway obstruction noted 7/27 CT chest >>> large L effusion, moderate R effusion 7/27 CT guided pleural catheter placed by IR. 1300 cc of serosanguinous fluid  7/28 Renal consult >>> CRRT initiated 7/31: extubated    INTERVAL HISTORY:  Back in Afib overnight, cardizem gtt, remains weak but oxygenation OK, perhaps more alert today, intermittently following commands   VITAL SIGNS: Temp:  [96.8 F (36 C)-98.4 F (36.9 C)] 97.8 F (36.6 C) (08/04 0945) Pulse Rate:  [42-125] 93 (08/04 0945) Resp:  [16-25] 22 (08/04 0945) BP: (80-125)/(46-81) 97/59 mmHg (08/04 0930) SpO2:  [95 %-100 %] 100 % (08/04 0945) Arterial Line BP: (64-129)/(48-88) 123/58 mmHg (08/04 0945) Weight:  [93.4 kg (205 lb 14.6 oz)] 93.4 kg (205 lb 14.6 oz) (08/04 0545)  HEMODYNAMICS: CVP:  [9 mmHg-22 mmHg] 18 mmHg     INTAKE / OUTPUT: Intake/Output     08/03 0701 - 08/04 0700 08/04 0701 - 08/05 0700   I.V. (mL/kg) 679.5 (7.3) 49 (0.5)   Blood 350    Other     NG/GT 1240 150   IV Piggyback 452 134   Total Intake(mL/kg) 2721.5 (29.1) 333 (3.6)   Urine (mL/kg/hr) 10 (0)    Other 4697 (2.1) 616 (2.2)   Stool 500 (0.2)    Chest Tube     Total Output  5207 616   Net -2485.5 -283         PHYSICAL EXAMINATION: General: comfortable, following simple commands Neuro: awake alert, raises left arm on command HEENT:  Panda tube Cardiovascular: Irreg irreg, no mgr Lungs: few crackles in bases bilaterally Abdomen: BS+, soft nontender Ext: Bilateral symmetric pitting edema  LABS: CBC  Recent Labs Lab 02/15/14 0400 02/16/14 0400 02/17/14 0400  WBC 10.0 10.2 10.7*  HGB 7.8* 7.6* 8.4*  HCT 25.6* 25.2* 27.5*  PLT 190 184 185   Coag's  Recent Labs Lab 02/17/14 0400  APTT 113*   BMET  Recent Labs Lab 02/16/14 0400 02/16/14 1500 02/17/14 0400  NA 139 135* 137  K 3.9 3.8 3.9  CL 102 96 100  CO2 26 27 25   BUN 26* 28* 26*  CREATININE 1.25* 1.35* 1.21*  GLUCOSE 117* 168* 100*   Electrolytes  Recent Labs Lab 02/15/14 1551 02/16/14 0400 02/16/14 1500 02/17/14 0400  CALCIUM 7.6* 8.1* 8.1* 8.2*  MG  --  2.5  --  2.6*  PHOS 2.2*  --  2.9 2.0*    Sepsis Markers No results found for this basename: LATICACIDVEN, PROCALCITON, O2SATVEN,  in the last 168 hours  ABG  Recent Labs Lab 02/13/14 1021 02/14/14 1649  PHART 7.462* 7.708*  PCO2ART 38.1 19.6*  PO2ART 90.0 25.0*   Liver  Enzymes  Recent Labs Lab 02/15/14 0400  02/16/14 0400 02/16/14 1500 02/17/14 0400  AST 19  --  23  --  30  ALT 14  --  13  --  16  ALKPHOS 88  --  92  --  114  BILITOT 0.6  --  0.6  --  0.8  ALBUMIN 2.4*  < > 2.4* 2.4* 2.6*  < > = values in this interval not displayed. Cardiac Enzymes No results found for this basename: TROPONINI, PROBNP,  in the last 168 hours  Glucose  Recent Labs Lab 02/16/14 1228 02/16/14 1528 02/16/14 1937 02/16/14 2342 02/17/14 0350 02/17/14 0734  GLUCAP 159* 144* 99 121* 97 119*   IMAGING: Dg Chest Port 1 View  02/16/2014   CLINICAL DATA:  Followup effusions  EXAM: PORTABLE CHEST - 1 VIEW  COMPARISON:  02/14/2014  FINDINGS: Postsurgical changes are again seen. The cardiac shadow is stable. A  feeding catheter, right-sided PICC line and left-sided central venous catheter are again seen and stable. A left-sided pleural drain is again noted. No pneumothorax or sizable pleural effusion is noted on the left. Persistent right-sided pleural effusion is seen. Mild vascular congestion remains.  IMPRESSION: Persistent right-sided effusion stable from the prior exam. Persistent vascular congestion is noted as well.  Persistent left pleural drain in satisfactory position.  Tubes and lines as described.   Electronically Signed   By: Alcide CleverMark  Lukens M.D.   On: 02/16/2014 07:22     ASSESSMENT / PLAN:  Acute hypoxemic respiratory failure, extubated 7/31 HCAP Goal SpO2>92 Supplemental oxygen Flutter valve / incentive spirometry Mobilize as able (Goal up in chair this week) Watch resp status carefully in ICU Continue fluid removal  CT removal per TCTS  Large L pleural effusion - s/p pleural cath drainage 7/27 Chest tube management per TCTS  AKI Acute on chronic diastolic heart failure Pulmonary edema Renal following Cont CRRT with neg balance as tol  Continue Levophed / Milrinone per TCTS Consider repeat Echo  Afib with RVR Cardizem per TCTS  Anemia  F/u CBC  HCAP D/c zosyn after today  Derlirium> chronic?, baseline dementia? Avoid sedating meds Lights on in daytime, off at night Frequent oritentation  Px Protonix Heparin [place]   CRITICAL CARE:  The patient is critically ill with multiple organ systems failure and requires high complexity decision making for assessment and support, frequent evaluation and titration of therapies, application of advanced monitoring technologies and extensive interpretation of multiple databases. Critical Care Time devoted to patient care services described in this note is 32 minutes.   Heber CarolinaBrent Evalin Shawhan, MD [place] [facility] Pager: [phone] Cell: [phone] If no response, call [phone]

## 2014-02-17 NOTE — Progress Notes (Signed)
CVVHD management.  Remains metabolically stable except for sl low PO4 which I will replace. Still on pressors.  CVP 10-11.  Cont with 50-100cc/hr fluid removal.

## 2014-02-17 NOTE — Evaluation (Signed)
Physical Therapy Evaluation Patient Details Name: [name] MRN: [id number] DOB: May 20, 1941 Today's Date: 02/17/2014   History of Present Illness  Pt adm with MI and underwent emergent CABG x 4 and MVR on 7/13. Post op course complicated by VDRF and bil pleural effusions and requiring CVVHD. Extubated 7/31. PMH - DM, MI, HTN,  Clinical Impression  Pt admitted with above. Pt currently with functional limitations due to the deficits listed below (see PT Problem List).  Pt will benefit from skilled PT to increase their independence and safety with mobility to allow discharge to the venue listed below.       Follow Up Recommendations LTACH    Equipment Recommendations  Other (comment) (to be determined.)    Recommendations for Other Services       Precautions / Restrictions Precautions Precautions: Other (comment) Precaution Comments: CVVHD in neck, multiple lines/tubes.      Mobility  Bed Mobility                  Transfers                    Ambulation/Gait                Stairs            Wheelchair Mobility    Modified Rankin (Stroke Patients Only)       Balance                                             Pertinent Vitals/Pain VSS    Home Living Family/patient expects to be discharged to:: Private residence Living Arrangements: Children               Additional Comments: Pt unable to provide info and family not present.    Prior Function           Comments: Pt unable to provide info and family not present.     Hand Dominance        Extremity/Trunk Assessment   Upper Extremity Assessment: RUE deficits/detail;LUE deficits/detail;Difficult to assess due to impaired cognition RUE Deficits / Details: No movement to command. Passively shoulder limited to ~45 degrees flexion, and 70 degrees abduction. Pt possibly actively resisting.     LUE Deficits / Details: Shoulder limited to 90 degrees  flexion due to HD catheter. Pt actively assisting with shoulder movement.   Lower Extremity Assessment: RLE deficits/detail;LLE deficits/detail;Difficult to assess due to impaired cognition RLE Deficits / Details: No active movement to command or spontaneous movement noted. Effort required to passisely move hip, knee, ankle into flexion. LLE Deficits / Details: No active movement to command or spontaneous movement noted. Effort required to passisely move hip, knee, ankle into flexion.     Communication   Communication: Other (comment) (No attempts to verbalize.)  Cognition Arousal/Alertness: Awake/alert Behavior During Therapy: Flat affect Overall Cognitive Status: Difficult to assess                 General Comments: Pt didn't follow any commands. Assisted only with ex's on LUE.    General Comments      Exercises General Exercises - Upper Extremity Shoulder Flexion: PROM;AAROM;Right;Left;5 reps;Supine Shoulder ABduction: PROM;AAROM;Right;Left;5 reps;Supine General Exercises - Lower Extremity Heel Slides: PROM;Both;10 reps;Supine Hip ABduction/ADduction: PROM;Both;5 reps;Supine      Assessment/Plan    PT Assessment Patient needs  continued PT services  PT Diagnosis Altered mental status;Generalized weakness;Difficulty walking   PT Problem List Decreased strength;Decreased range of motion;Decreased activity tolerance;Decreased balance;Decreased mobility;Decreased cognition;Decreased knowledge of use of DME;Decreased knowledge of precautions;Obesity  PT Treatment Interventions Functional mobility training;Therapeutic activities;Therapeutic exercise;Balance training;Cognitive remediation;Patient/family education   PT Goals (Current goals can be found in the Care Plan section) Acute Rehab PT Goals Patient Stated Goal: Pt not verbalizing PT Goal Formulation: Patient unable to participate in goal setting Time For Goal Achievement: 03/03/14 Potential to Achieve Goals:  Fair Additional Goals Additional Goal #1: Pt will assist in performing ex's to all 4 extremities.    Frequency Min 2X/week   Barriers to discharge        Co-evaluation               End of Session   Activity Tolerance: Other (comment) (Limited by cognition.) Patient left: in bed           Time: [id number] PT Time Calculation (min): 18 min   Charges:   PT Evaluation $Initial PT Evaluation Tier I: 1 Procedure PT Treatments $Therapeutic Exercise: 8-22 mins   PT G Codes:          Zanetta Dehaan 02/17/2014, 10:48 AM  [facility] Zel[facility] Bush[facility] PT [phone]

## 2014-02-17 NOTE — Progress Notes (Signed)
Echo Lab  2D Echocardiogram completed.  Alonah Lineback L Orpheus [place], RDCS 02/17/2014 4:56 PM

## 2014-02-17 NOTE — Progress Notes (Signed)
TCTS BRIEF SICU PROGRESS NOTE  23 Days Post-Op  S/P Procedure(s) (LRB): CORONARY ARTERY BYPASS GRAFTING TIMES FOUR USING LEFT INTERNAL MAMMARY ARTERY TO LAD, SAPHENOUS VEIN GRAFTS TO OM1, OM2, AND PDA (N/A) MITRAL VALVE (MV) REPLACEMENT (N/A)   Reasonably stable day Afib w/ HR 90's BP stable on levophed + milrinone O2 sats 99% on O2 2 L/min via [place] Tolerating CVVHD taking some volume off Repeat ECHO pending  Plan: Continue current plan  OWEN,[name] 02/17/2014 4:35 PM

## 2014-02-18 ENCOUNTER — Inpatient Hospital Stay (HOSPITAL_COMMUNITY): Payer: Medicare Other

## 2014-02-18 DIAGNOSIS — R29818 Other symptoms and signs involving the nervous system: Secondary | ICD-10-CM

## 2014-02-18 DIAGNOSIS — R570 Cardiogenic shock: Secondary | ICD-10-CM

## 2014-02-18 DIAGNOSIS — E46 Unspecified protein-calorie malnutrition: Secondary | ICD-10-CM

## 2014-02-18 DIAGNOSIS — N178 Other acute kidney failure: Secondary | ICD-10-CM

## 2014-02-18 LAB — COMPREHENSIVE METABOLIC PANEL
ALT: 20 U/L (ref 0–35)
AST: 32 U/L (ref 0–37)
Albumin: 2.7 g/dL — ABNORMAL LOW (ref 3.5–5.2)
Alkaline Phosphatase: 109 U/L (ref 39–117)
Anion gap: 14 (ref 5–15)
BUN: 25 mg/dL — ABNORMAL HIGH (ref 6–23)
CO2: 25 mEq/L (ref 19–32)
Calcium: 8.8 mg/dL (ref 8.4–10.5)
Chloride: 97 mEq/L (ref 96–112)
Creatinine, Ser: 1.12 mg/dL — ABNORMAL HIGH (ref 0.50–1.10)
GFR calc Af Amer: 55 mL/min — ABNORMAL LOW (ref >90)
GFR calc non Af Amer: 47 mL/min — ABNORMAL LOW (ref >90)
Glucose, Bld: 185 mg/dL — ABNORMAL HIGH (ref 70–99)
Potassium: 4.2 mEq/L (ref 3.7–5.3)
Sodium: 136 mEq/L — ABNORMAL LOW (ref 137–147)
Total Bilirubin: 0.8 mg/dL (ref 0.3–1.2)
Total Protein: 8 g/dL (ref 6.0–8.3)

## 2014-02-18 LAB — CBC
HCT: 29.2 % — ABNORMAL LOW (ref 36.0–46.0)
HEMOGLOBIN: 8.9 g/dL — AB (ref 12.0–15.0)
MCH: 29.9 pg (ref 26.0–34.0)
MCHC: 30.5 g/dL (ref 30.0–36.0)
MCV: 98 fL (ref 78.0–100.0)
Platelets: 190 10*3/uL (ref 150–400)
RBC: 2.98 MIL/uL — AB (ref 3.87–5.11)
RDW: 19.1 % — ABNORMAL HIGH (ref 11.5–15.5)
WBC: 8.9 10*3/uL (ref 4.0–10.5)

## 2014-02-18 LAB — GLUCOSE, CAPILLARY
Glucose-Capillary: 170 mg/dL — ABNORMAL HIGH (ref 70–99)
Glucose-Capillary: 147 mg/dL — ABNORMAL HIGH (ref 70–99)
Glucose-Capillary: 203 mg/dL — ABNORMAL HIGH (ref 70–99)
Glucose-Capillary: 192 mg/dL — ABNORMAL HIGH (ref 70–99)
Glucose-Capillary: 193 mg/dL — ABNORMAL HIGH (ref 70–99)
Glucose-Capillary: 196 mg/dL — ABNORMAL HIGH (ref 70–99)

## 2014-02-18 LAB — RENAL FUNCTION PANEL
ANION GAP: 14 (ref 5–15)
Albumin: 2.7 g/dL — ABNORMAL LOW (ref 3.5–5.2)
BUN: 27 mg/dL — ABNORMAL HIGH (ref 6–23)
CALCIUM: 8.8 mg/dL (ref 8.4–10.5)
CO2: 24 mEq/L (ref 19–32)
Chloride: 97 mEq/L (ref 96–112)
Creatinine, Ser: 1.1 mg/dL (ref 0.50–1.10)
GFR, EST AFRICAN AMERICAN: 56 mL/min — AB (ref >90)
GFR, EST NON AFRICAN AMERICAN: 48 mL/min — AB (ref >90)
Glucose, Bld: 195 mg/dL — ABNORMAL HIGH (ref 70–99)
POTASSIUM: 4.1 meq/L (ref 3.7–5.3)
Phosphorus: 2.7 mg/dL (ref 2.3–4.6)
SODIUM: 135 meq/L — AB (ref 137–147)

## 2014-02-18 LAB — CARBOXYHEMOGLOBIN
Carboxyhemoglobin: 1.9 % — ABNORMAL HIGH (ref 0.5–1.5)
Methemoglobin: 0.8 % (ref 0.0–1.5)
O2 Saturation: 51.8 %
Total hemoglobin: 8.2 g/dL — ABNORMAL LOW (ref 12.0–16.0)

## 2014-02-18 LAB — POCT ACTIVATED CLOTTING TIME
Activated Clotting Time: 197 seconds
Activated Clotting Time: 191 seconds
Activated Clotting Time: 196 seconds
Activated Clotting Time: 202 seconds
Activated Clotting Time: 197 seconds
Activated Clotting Time: 225 seconds

## 2014-02-18 LAB — MAGNESIUM: Magnesium: 2.6 mg/dL — ABNORMAL HIGH (ref 1.5–2.5)

## 2014-02-18 LAB — PHOSPHORUS: Phosphorus: 2.4 mg/dL (ref 2.3–4.6)

## 2014-02-18 LAB — APTT: APTT: 139 s — AB (ref 24–37)

## 2014-02-18 MED ORDER — HEPARIN (PORCINE) IN NACL 100-0.45 UNIT/ML-% IJ SOLN
1200.0000 [IU]/h | INTRAMUSCULAR | Status: DC
  Administered 2014-02-18: 1200 [IU]/h via INTRAVENOUS
  Filled 2014-02-18 (×2): qty 250

## 2014-02-18 MED ORDER — POTASSIUM PHOSPHATES 15 MMOLE/5ML IV SOLN
10.0000 mmol | Freq: Once | INTRAVENOUS | Status: AC
  Administered 2014-02-18: 10 mmol via INTRAVENOUS
  Filled 2014-02-18: qty 3.33

## 2014-02-18 MED ORDER — AMIODARONE HCL IN DEXTROSE 360-4.14 MG/200ML-% IV SOLN
30.0000 mg/h | INTRAVENOUS | Status: DC
  Administered 2014-02-18 – 2014-02-23 (×9): 30 mg/h via INTRAVENOUS
  Filled 2014-02-18 (×20): qty 200

## 2014-02-18 MED ORDER — MILRINONE IN DEXTROSE 20 MG/100ML IV SOLN
0.3750 ug/kg/min | INTRAVENOUS | Status: DC
  Administered 2014-02-18 – 2014-02-19 (×2): 0.375 ug/kg/min via INTRAVENOUS
  Filled 2014-02-18 (×2): qty 100

## 2014-02-18 NOTE — Progress Notes (Signed)
PULMONARY / CRITICAL CARE MEDICINE  Name: [name] MRN: [id number] DOB: 02/03/1941    ADMISSION DATE:  01/25/2014 CONSULTATION DATE:  02/18/2014  REFERRING MD :  [name]  CHIEF COMPLAINT:  Hypoxemia  INITIAL PRESENTATION: 73 yo admitted 7/12 with STEMI. Taken to cath lab but unable to perform PCI due to significant RCA disease. Taken to OR emergently for CABG x 4.  Returned to SICU mechanically ventilated.  Course c/b failed extubation, pleural effusion, renal failure on CRRT,    STUDIES / EVENTS:  7/12 Left heart cath: occlusion of RCA not amendable to PCI 7/13 CABG x 4 + MVR [name]) 7/20 TTE:  EF 55%, no RWMA, grade 3 DD, PAP 41 torr 7/24 R thoracentesis 1,200 mL 7/25 Extubated 7/27 Left hemithorax opacification. Reintubated 7/27 FOB >>> no overt airway obstruction noted 7/27 CT chest >>> large L effusion, moderate R effusion 7/27 CT guided pleural catheter placed by IR. 1300 cc of serosanguinous fluid  7/28 Renal consult >>> CRRT initiated 7/31: extubated    INTERVAL HISTORY:  More alert.  Intermittent agitation.  Weaning levophed.  Remains on milrinone, amio.  Co-ox 52%  VITAL SIGNS: Temp:  [97.1 F (36.2 C)-97.8 F (36.6 C)] 97.6 F (36.4 C) (08/05 0915) Pulse Rate:  [60-102] 95 (08/05 0915) Resp:  [17-30] 22 (08/05 0915) BP: (80-124)/(44-82) 107/52 mmHg (08/05 0915) SpO2:  [97 %-100 %] 99 % (08/05 0915) Arterial Line BP: (106-113)/(53-58) 113/58 mmHg (08/04 1100) Weight:  [198 lb 6.6 oz (90 kg)] 198 lb 6.6 oz (90 kg) (08/05 0600)  HEMODYNAMICS: CVP:  [10 mmHg-18 mmHg] 10 mmHg     INTAKE / OUTPUT: Intake/Output     08/04 0701 - 08/05 0700 08/05 0701 - 08/06 0700   I.V. (mL/kg) 1126 (12.5) 61 (0.7)   Blood     Other 180 60   NG/GT 1210 110   IV Piggyback 638    Total Intake(mL/kg) 3154 (35) 231 (2.6)   Urine (mL/kg/hr) 30 (0)    Other 5374 (2.5) 403 (1.6)   Stool 550 (0.3) 100 (0.4)   Total Output 5954 503   Net -2800 -272         PHYSICAL  EXAMINATION: General: comfortable,NAD Neuro: more awake, follows simple commands, MAE, gen weakness  HEENT:  Panda tube, mm dry, poor oral hygiene  Cardiovascular: Irreg irreg, no mgr Lungs: resps even non labored on [place], few crackles in bases bilaterally Abdomen: BS+, soft nontender Ext: Improved edema   LABS: CBC  Recent Labs Lab 02/16/14 0400 02/17/14 0400 02/18/14 0500  WBC 10.2 10.7* 8.9  HGB 7.6* 8.4* 8.9*  HCT 25.2* 27.5* 29.2*  PLT 184 185 190   Coag's  Recent Labs Lab 02/17/14 0400 02/18/14 0500  APTT 113* 139*   BMET  Recent Labs Lab 02/17/14 0400 02/17/14 1500 02/17/14 1628 02/18/14 0500  NA 137 130*  --  136*  K 3.9 6.2* 3.8 4.2  CL 100 93*  --  97  CO2 25 23  --  25  BUN 26* 27*  --  25*  CREATININE 1.21* 1.06  --  1.12*  GLUCOSE 100* 380*  --  185*   Electrolytes  Recent Labs Lab 02/16/14 0400  02/17/14 0400 02/17/14 1500 02/18/14 0500  CALCIUM 8.1*  < > 8.2* 7.7* 8.8  MG 2.5  --  2.6*  --  2.6*  PHOS  --   < > 2.0* 7.9* 2.4  < > = values in this interval not displayed.  Sepsis Markers No results found for this basename: LATICACIDVEN, PROCALCITON, O2SATVEN,  in the last 168 hours  ABG  Recent Labs Lab 02/13/14 1021 02/14/14 1649 02/17/14 1013  PHART 7.462* 7.708* 7.463*  PCO2ART 38.1 19.6* 37.2  PO2ART 90.0 25.0* 78.0*   Liver Enzymes  Recent Labs Lab 02/16/14 0400  02/17/14 0400 02/17/14 1500 02/18/14 0500  AST 23  --  30  --  32  ALT 13  --  16  --  20  ALKPHOS 92  --  114  --  109  BILITOT 0.6  --  0.8  --  0.8  ALBUMIN 2.4*  < > 2.6* 2.4* 2.7*  < > = values in this interval not displayed. Cardiac Enzymes No results found for this basename: TROPONINI, PROBNP,  in the last 168 hours  Glucose  Recent Labs Lab 02/17/14 1135 02/17/14 1559 02/17/14 1929 02/17/14 2358 02/18/14 0339 02/18/14 0757  GLUCAP 194* 231* 192* 171* 170* 147*   IMAGING: Dg Chest 1 View  02/17/2014   CLINICAL DATA:  Left-sided chest  tube removal.  EXAM: CHEST - 1 VIEW  COMPARISON:  One-view chest 02/17/2014.  FINDINGS: The heart is enlarged. The left-sided chest tube has been removed. There is no pneumothorax. A left IJ catheter is stable in position. A right-sided PICC line is stable in position. The NG tube courses off the inferior border the film. There is interval increase in diffuse interstitial edema. Bilateral pleural effusions are noted.  IMPRESSION: 1. Interval removal of left-sided chest tube without pneumothorax. 2. The support apparatus is otherwise stable. 3. Interval increase in diffuse interstitial edema.   Electronically Signed   By: Brooke Pachris  [name] M.D.   On: 02/17/2014 12:19   Dg Chest Portable 1 View  02/17/2014   CLINICAL DATA:  Respiratory failure.  EXAM: PORTABLE CHEST - 1 VIEW  COMPARISON:  02/16/2014, 02/13/2014  FINDINGS: Left IJ catheter, NG tube, PICC line in stable position. Left chest tube in stable position. Cardiomegaly with pulmonary vascular congestion. Prior CABG. Mild bilateral alveolar infiltrates are present. Bilateral pleural effusion present. These findings suggest congestive heart failure. Left lower lobe atelectasis. No pneumothorax. No acute osseous abnormality.  IMPRESSION: 1. Line and tube position stable. Left chest tube in unchanged position. No pneumothorax. 2. Cardiomegaly. Prior CABG. Bilateral pulmonary infiltrates and pleural effusions suggest congestive heart failure with pulmonary edema. 3. Left lower lobe atelectasis.   Electronically Signed   By: Brooke Fushomas  [name]   On: 02/17/2014 07:44     ASSESSMENT / PLAN:  Acute hypoxemic respiratory failure, extubated 7/31 HCAP Goal SpO2>92 Supplemental oxygen Flutter valve / incentive spirometry Mobilize as able (Goal up in chair this week) Watch resp status carefully in ICU Continue fluid removal    Large L pleural effusion - s/p pleural cath drainage 7/27 Chest tubes removed 8/4  AKI Acute on chronic diastolic heart  failure Pulmonary edema Renal following Cont CRRT with neg balance as tol  Continue Levophed / Milrinone per TCTS Repeat echo unchanged   Afib with RVR Cardizem per TCTS  Anemia  F/u CBC  HCAP S/p course abx  Monitor wbc, fever curve off abx   Derlirium> chronic?, baseline dementia? Avoid sedating meds Lights on in daytime, off at night Frequent oritentation Consider swallow eval with improved mentation   Px Protonix Heparin [place]   Dirk DressKaty Whiteheart, NP 02/18/2014  9:50 AM Pager: [phone] or [phone]  *Care during the described time interval was provided by me and/or other providers on  the critical care team. I have reviewed this patient's available data, including medical history, events of note, physical examination and test results as part of my evaluation.  Attending:  Improving Hopefully out of bed this week  Heber [place], MD [place] [facility] Pager: [phone] Cell: [phone] If no response, call [phone]

## 2014-02-18 NOTE — Progress Notes (Signed)
CVVHD Management.  Metabolically stable though no PO4 done this AM for ? Reason.  Will call lab and add it.  Remains on levophed and milrinone.  Don't think she could handle IHD til BP improves.  Cont to pull 100c/hr.  CXR slowly improving.

## 2014-02-18 NOTE — Progress Notes (Addendum)
Advanced Heart Failure Rounding Note   Subjective:    [name] is a 73 yo female with a history of CAD s/p PCI at [facility], HTN, TIA, DM2 and PAD. She presented to [facility] 01/25/14 with CP,Vtach and ST changes in inferior leads and was taken to the cath lab. She had severe multivessel CAD involving 100% distal RCA with tandem 90% & 80% lesions, proximal D1 100%, proximal LAD & AVGroove Circ 90% with 80% OM2 and was evaluated by Dr. Maren Beach for CABG and IABP was placed.  Underwent emergent CABGx4 and MV replacement (LIMA-LAD, SVG to OM1, SVG to OM2 and SVG to PDA) on 01/26/14. She was transferred to the ICU on multiple pressors and IABP. UOP was sluggish and remained volume overloaded and lasix gtt was started. IABP removed 7/17. TF started for nutrition. Had difficulty with thrombocytopenia which improved and HIT studies negative and Coumadin was restarted. Underwent thoracentesis on R on 02/06/14. Extubated 7/25 but re-intubated 7/27 electively for therapeutic bronchoscopy showing no significant mucus, airways clear w/ exception of what appeared to be suction trauma at the inlet of the RUL. Underwent L CT by IR on 7/27. Her Creatinine was stable for awhile and then started trending up even on milrinone. Underwent HD cath placement 7/28 and started on CVVHD on 7/29. Extubated again 02/13/14. Has had issues with Afib/Aflutter.   ECHO 02/17/54. Reviewed personally EF 35-40% Inferior wall out. RV moderately HK with TAPSE 1.18cm  Remains on CVVHD, milrinone, levophed and amiodarone. Weight down 7 lbs. Co-ox 52%. Remains anuric. Will not communicate. CVP 10  Objective:   Weight Range:  Vital Signs:   Temp:  [97.1 F (36.2 C)-97.7 F (36.5 C)] 97.1 F (36.2 C) (08/05 1227) Pulse Rate:  [60-98] 86 (08/05 1215) Resp:  [17-30] 20 (08/05 1215) BP: (80-124)/(36-77) 100/58 mmHg (08/05 1215) SpO2:  [97 %-100 %] 99 % (08/05 1215) Weight:  [198 lb 6.6 oz (90 kg)] 198 lb 6.6 oz (90 kg) (08/05 0600) Last BM Date:  02/18/14  Weight change: Filed Weights   02/16/14 0700 02/17/14 0545 02/18/14 0600  Weight: 210 lb 8.6 oz (95.5 kg) 205 lb 14.6 oz (93.4 kg) 198 lb 6.6 oz (90 kg)    Intake/Output:   Intake/Output Summary (Last 24 hours) at 02/18/14 1334 Last data filed at 02/18/14 1300  Gross per 24 hour  Intake 3087.25 ml  Output   6020 ml  Net -2932.75 ml     Physical Exam: CVP 12 with prominent CV waves General:  Chronically ill appearing. No resp difficulty HEENT: normal; Panda intact; L IJ HD cath Neck: supple. JVP difficult to assess d/t body habiuts but appears mildly elevated with CV waves . Carotids 2+ bilat; no bruits. No lymphadenopathy or thryomegaly appreciated. Cor: PMI nondisplaced. Regular rate & irregular rhythm. No rubs, gallops or murmurs. Lungs: Diminished in the bases Abdomen: soft, nontender, nondistended. No hepatosplenomegaly. No bruits or masses. Good bowel sounds. Extremities: no cyanosis, clubbing, rash, 1+ bilateral edema; R 3L PICC Neuro: alert. Follows commands. Will whisper her name but o/w wont talk.  moves all 4 extremities w/o difficulty. Affect flat   Telemetry: Afib/flutter 80-90s  Labs: Basic Metabolic Panel:  Recent Labs Lab 02/15/14 1551 02/16/14 0400 02/16/14 1500 02/17/14 0400 02/17/14 1500 02/17/14 1628 02/18/14 0500  NA 137 139 135* 137 130*  --  136*  K 3.5* 3.9 3.8 3.9 6.2* 3.8 4.2  CL 102 102 96 100 93*  --  97  CO2 24 26 27 25 23   --  25  GLUCOSE 120* 117* 168* 100* 380*  --  185*  BUN 27* 26* 28* 26* 27*  --  25*  CREATININE 1.20* 1.25* 1.35* 1.21* 1.06  --  1.12*  CALCIUM 7.6* 8.1* 8.1* 8.2* 7.7*  --  8.8  MG  --  2.5  --  2.6*  --   --  2.6*  PHOS 2.2*  --  2.9 2.0* 7.9*  --  2.4    Liver Function Tests:  Recent Labs Lab 02/14/14 0352  02/15/14 0400  02/16/14 0400 02/16/14 1500 02/17/14 0400 02/17/14 1500 02/18/14 0500  AST 18  --  19  --  23  --  30  --  32  ALT 13  --  14  --  13  --  16  --  20  ALKPHOS 83  --  88   --  92  --  114  --  109  BILITOT 0.7  --  0.6  --  0.6  --  0.8  --  0.8  PROT 7.0  --  7.3  --  7.4  --  7.5  --  8.0  ALBUMIN 2.3*  < > 2.4*  < > 2.4* 2.4* 2.6* 2.4* 2.7*  < > = values in this interval not displayed. No results found for this basename: LIPASE, AMYLASE,  in the last 168 hours No results found for this basename: AMMONIA,  in the last 168 hours  CBC:  Recent Labs Lab 02/14/14 0352 02/15/14 0400 02/16/14 0400 02/17/14 0400 02/18/14 0500  WBC 9.2 10.0 10.2 10.7* 8.9  HGB 7.6* 7.8* 7.6* 8.4* 8.9*  HCT 24.5* 25.6* 25.2* 27.5* 29.2*  MCV 96.8 98.1 98.4 95.8 98.0  PLT 209 190 184 185 190    Cardiac Enzymes: No results found for this basename: CKTOTAL, CKMB, CKMBINDEX, TROPONINI,  in the last 168 hours  BNP: BNP (last 3 results) No results found for this basename: PROBNP,  in the last 8760 hours   Other results:  EKG: AF  Imaging: Dg Chest 1 View  02/17/2014   CLINICAL DATA:  Left-sided chest tube removal.  EXAM: CHEST - 1 VIEW  COMPARISON:  One-view chest 02/17/2014.  FINDINGS: The heart is enlarged. The left-sided chest tube has been removed. There is no pneumothorax. A left IJ catheter is stable in position. A right-sided PICC line is stable in position. The NG tube courses off the inferior border the film. There is interval increase in diffuse interstitial edema. Bilateral pleural effusions are noted.  IMPRESSION: 1. Interval removal of left-sided chest tube without pneumothorax. 2. The support apparatus is otherwise stable. 3. Interval increase in diffuse interstitial edema.   Electronically Signed   By: [name] M.D.   On: 02/17/2014 12:19   Dg Chest Port 1 View  02/18/2014   CLINICAL DATA:  Pulmonary edema  EXAM: PORTABLE CHEST - 1 VIEW  COMPARISON:  February 17, 2014  FINDINGS: Dual-lumen central catheter has its tip in the superior vena cava. Right central catheter tip is also in the superior vena cava. Feeding tube tip is below the diaphragm and not  seen. No pneumothorax. There is less interstitial edema compared to 1 day prior. There is atelectatic change in both lung bases. There are effusions bilaterally, larger on the right than on the left, stable. Cardiomegaly is stable. There is pulmonary venous hypertension. 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  IMPRESSION: Less interstitial edema compared to 1 day prior.  Moderate edema remains. Other changes of congestive heart failure are stable. There is no new opacity. No pneumothorax.   Electronically Signed   By: Bretta Bang M.D.   On: 02/18/2014 07:09   Dg Chest Portable 1 View  02/17/2014   CLINICAL DATA:  Respiratory failure.  EXAM: PORTABLE CHEST - 1 VIEW  COMPARISON:  02/16/2014, 02/13/2014  FINDINGS: Left IJ catheter, NG tube, PICC line in stable position. Left chest tube in stable position. Cardiomegaly with pulmonary vascular congestion. Prior CABG. Mild bilateral alveolar infiltrates are present. Bilateral pleural effusion present. These findings suggest congestive heart failure. Left lower lobe atelectasis. No pneumothorax. No acute osseous abnormality.  IMPRESSION: 1. Line and tube position stable. Left chest tube in unchanged position. No pneumothorax. 2. Cardiomegaly. Prior CABG. Bilateral pulmonary infiltrates and pleural effusions suggest congestive heart failure with pulmonary edema. 3. Left lower lobe atelectasis.   Electronically Signed   By: Maisie Fus  Register   On: 02/17/2014 07:44     Medications:     Scheduled Medications: . amiodarone  150 mg Intravenous Once  . aspirin  324 mg Per Tube Daily  . bisacodyl  10 mg Oral Daily   Or  . bisacodyl  10 mg Rectal Daily  . chlorhexidine  15 mL Mouth Rinse BID  . feeding supplement (NEPRO CARB STEADY)  1,000 mL Per Tube Q24H  . feeding supplement (PRO-STAT SUGAR FREE 64)  30 mL Per Tube QID  . insulin aspart  0-24 Units Subcutaneous 6 times per day  . insulin glargine  25 Units  Subcutaneous BID  . levalbuterol  0.63 mg Nebulization Q6H  . levothyroxine  75 mcg Per Tube QAC breakfast  . pantoprazole sodium  40 mg Per Tube Q1200  . potassium phosphate IVPB (mmol)  10 mmol Intravenous Once  . sodium chloride  10-40 mL Intracatheter Q12H    Infusions: . sodium chloride 10 mL/hr at 02/18/14 0800  . sodium chloride Stopped (02/18/14 0700)  . amiodarone 30 mg/hr (02/18/14 1242)  . heparin 10,000 units/ 20 mL infusion syringe 1,650 Units/hr (02/18/14 1300)  . milrinone 0.25 mcg/kg/min (02/18/14 0800)  . norepinephrine (LEVOPHED) Adult infusion 4 mcg/min (02/18/14 0800)  . dialysis replacement fluid (prismasate) 300 mL/hr at 02/17/14 2051  . dialysis replacement fluid (prismasate) 200 mL/hr at 02/17/14 1509  . dialysate (PRISMASATE) 2,000 mL/hr at 02/18/14 1044    PRN Medications: acetaminophen, heparin, heparin, ondansetron (ZOFRAN) IV, oxyCODONE, sodium chloride, sodium chloride   Assessment:   1) CAD - s/p CABG x 4 with MV replacement 2) A/C respiratory failure 3) AKI - On CVVHD 4) Cardiogenic shock 5) Afib/A Flutter 6) L Pleural effusion - s/p L CT 7) Anemia 8) Delerium, acute 9) ? 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 She has persistent cardiogenic shock s/p inferior STEMI and CABG/MVR and remains on multiple pressors. Repeat ECHO yesterday reviewed by Dr. [name] and EF depressed 35-40% and RV systolic fx severely depressed, TAPSE 1.18. She remains on levophed and milrinone with a co-ox of 51%. Will increase milrinone to 0.375 and continue to follow co-ox's. CVP 8-10. Would change to keeping more even now and not pulling with CRRT since she has RV failure she likely needs more volume and would shoot for CVP 8-12.   Remains in AF/AFL now on IV amiodarone. Rate controlled. Discussion has taken place about seeing if restoration of NSR would help with output, however not sure if she will benefit much from this  but we could try. TCTS ok with heparin for Afib so will place pharmacy consult for heparin gtt.   Unfortunately I think our options are very limited. Overall prognosis does not look good with RV failure and renal failure. Dr. Gala RomneyBensimhon talked to grandson in length yesterday about his concerns, however they still wanted aggressive treatment. May need to consider having family meeting with Palliative Care to discuss Goals of Care, will leave decision up to TCTS.    I am not sure what is going on with her mental status. She seems to follow commands but will not speak. I think before we push too much harder we should have family meeting and develop clear goals of care.   Length of Stay: 24 Aundria RudCosgrove, Ali B NP-C 02/18/2014, 1:34 PM  Advanced Heart Failure Team Pager [phone] (M-F; 7a - 4p)  Please contact [facility] for night-coverage after hours (4p -7a ) and weekends on [email or web address]   Patient seen and examined with Ulla PotashAli Cosgrove, NP. We discussed all aspects of the encounter. I agree with the assessment and plan as stated above.   Echo reviewed personally. She has had a large inferior MI with RV involvement. Although RV only looks moderately down on echo TAPSE is very low c/w severe RV failure and is likely the source of her ongoing cardiogenic shock. Agree with increasing milrinone to try and optimize renal perfusion. Will wean levophed as tolerated but keep MAP > = 60% and co-ox >-60%. Continue to await for renal recovery. I do not think she will tolerate HD unless RV starts to recover. Overall prognosis is very concerning. Continue amio for AFL. Start heparin.   Would adjust CVVHD rate to keep CVP 8-12 range with RV failure. Would not go lower.   We will follow.   The patient is critically ill with multiple organ systems failure and requires high complexity decision making for assessment and support, frequent evaluation and titration of therapies, application of advanced monitoring technologies  and extensive interpretation of multiple databases.   Critical Care Time personally devoted to patient care services described in this note is 35 Minutes.  Truman HaywardDaniel Bensimhon,MD 4:37 PM

## 2014-02-18 NOTE — Progress Notes (Signed)
Pt seemed a little anxious with the HHN mask, so held manually.

## 2014-02-18 NOTE — Progress Notes (Signed)
24 Days Post-Op Procedure([name]) (LRB): CORONARY ARTERY BYPASS GRAFTING TIMES FOUR USING LEFT INTERNAL MAMMARY ARTERY TO LAD, SAPHENOUS VEIN GRAFTS TO OM1, OM2, AND PDA (N/A) MITRAL VALVE (MV) REPLACEMENT (N/A) Subjective:  Eyes open and nods no when I ask her about pain. She will squeeze my hand a little. Non-verbal  Objective: Vital signs in last 24 hours: Temp:  [97.1 F (36.2 C)-97.9 F (36.6 C)] 97.5 F (36.4 C) (08/05 0754) Pulse Rate:  [60-102] 95 (08/05 0600) Cardiac Rhythm:  [-] Atrial fibrillation (08/04 1920) Resp:  [17-30] 26 (08/05 0600) BP: (80-124)/(44-82) 112/62 mmHg (08/05 0600) SpO2:  [97 %-100 %] 98 % (08/05 0756) Arterial Line BP: (106-123)/(53-60) 113/58 mmHg (08/04 1100) Weight:  [90 kg (198 lb 6.6 oz)] 90 kg (198 lb 6.6 oz) (08/05 0600)  Hemodynamic parameters for last 24 hours: CVP:  [12 mmHg-19 mmHg] 14 mmHg  Intake/Output from previous day: 08/04 0701 - 08/05 0700 In: 2964.7 [I.V.:976.7; NG/GT:1170; IV Piggyback:638] Out: 5905 [Urine:30; Stool:550] Intake/Output this shift: Total I/O In: -  Out: 183 [Other:183]  Heart: irregularly irregular rhythm Lungs: clear to auscultation bilaterally Abdomen: soft, non-tender; bowel sounds normal; no masses,  no organomegaly Extremities: extremities normal, atraumatic, no cyanosis or edema Wound: incisions ok  Lab Results:  Recent Labs  02/17/14 0400 02/18/14 0500  WBC 10.7* 8.9  HGB 8.4* 8.9*  HCT 27.5* 29.2*  PLT 185 190   BMET:  Recent Labs  02/17/14 1500 02/17/14 1628 02/18/14 0500  NA 130*  --  136*  K 6.2* 3.8 4.2  CL 93*  --  97  CO2 23  --  25  GLUCOSE 380*  --  185*  BUN 27*  --  25*  CREATININE 1.06  --  1.12*  CALCIUM 7.7*  --  8.8    PT/INR: No results found for this basename: LABPROT, INR,  in the last 72 hours ABG    Component Value Date/Time   PHART 7.463* 02/17/2014 1013   HCO3 26.6* 02/17/2014 1013   TCO2 28 02/17/2014 1013   ACIDBASEDEF 0.1 01/30/2014 0339   O2SAT 51.8  02/18/2014 0545   CBG (last 3)   Recent Labs  02/17/14 1929 02/17/14 2358 02/18/14 0339  GLUCAP 192* 171* 170*   *[place]* *[facility]* [address] [place], [place] [address] [phone]  ------------------------------------------------------------------- Transthoracic Echocardiography  Patient: [name], [name] MR #: [id number] Study Date: 02/17/2014 Gender: F Age: 73 Height: 170.2 cm Weight: 93.4 kg BSA: 2.13 m^2 Pt. Status: Room: 2S04C  ATTENDING Lovett SoxVanTrigt, Peter SONOGRAPHER Melissa Morford, RDCS ORDERING Ulla Potashosgrove, Ali B PERFORMING [facility], Inpatient  cc:  ------------------------------------------------------------------- LV EF: 45% - 50%  ------------------------------------------------------------------- Indications: CHF - 428.0.  ------------------------------------------------------------------- History: PMH: Atrial fibrillation. Coronary artery disease. Coronary artery disease. PMH: Myocardial infarction. Risk factors: Hypertension. Diabetes mellitus.  ------------------------------------------------------------------- Study Conclusions  - Left ventricle: The cavity size was normal. There was moderate concentric hypertrophy. Systolic function was mildly reduced. The estimated ejection fraction was in the range of 45% to 50%. Wall motion was normal; there were no regional wall motion abnormalities. Doppler parameters are consistent with a restrictive pattern, indicative of decreased left ventricular diastolic compliance and/or increased left atrial pressure (grade 3 diastolic dysfunction). - Ventricular septum: Septal motion showed paradox. - Aortic root: The aortic root was normal in size. - Mitral valve: Mechanical mitral valve appears to be functioning normally. There is no mitral regurgitation or paravalvular leak. Transmitral gradients are normal for this type of prosthesis. Mobility was not  restricted. Valve area by pressure  half-time: 2.42 cm^2. - Left atrium: The atrium was mildly dilated. - Right ventricle: The cavity size was mildly dilated. Wall thickness was normal. Systolic function was normal. - Tricuspid valve: There was mild regurgitation.  Impressions:  - There is no significant change when compared to the prior study from 02/02/2014. Pulmonary hypertension can&'t be acurately estimated on the current study.  ------------------------------------------------------------------- Labs, prior tests, procedures, and surgery: Coronary artery bypass grafting (current admission).  Valve surgery (current admission). Mitral valve replacement. Transthoracic echocardiography. M-mode, complete 2D, spectral Doppler, and color Doppler. Birthdate: Patient birthdate: [birth year]. Age: Patient is 73 yr old. Sex: Gender: female. Height: Height: 170.2 cm. Height: 67 in. Weight: Weight: 93.4 kg. Weight: 205.5 lb. Body mass index: BMI: 32.2 kg/m^2. Body surface area: BSA: 2.13 m^2. Blood pressure: 93/54 Patient status: Inpatient. Study date: Study date: 02/17/2014. Study time: 02:55 PM.  -------------------------------------------------------------------  ------------------------------------------------------------------- Left ventricle: The cavity size was normal. There was moderate concentric hypertrophy. Systolic function was mildly reduced. The estimated ejection fraction was in the range of 45% to 50%. Wall motion was normal; there were no regional wall motion abnormalities. Doppler parameters are consistent with a restrictive pattern, indicative of decreased left ventricular diastolic compliance and/or increased left atrial pressure (grade 3 diastolic dysfunction).  ------------------------------------------------------------------- Aortic valve: Trileaflet; mildly thickened leaflets. Mobility was not restricted. Doppler: Transvalvular velocity was within the normal range. There was no stenosis. There  was no regurgitation.  ------------------------------------------------------------------- Aorta: Aortic root: The aortic root was normal in size.  ------------------------------------------------------------------- Mitral valve: Mechanical mitral valve appears to be functioning normally. There is no mitral regurgitation or paravalvular leak. Transmitral gradients are normal for this type of prosthesis. Mobility was not restricted. Doppler: Transvalvular velocity was within the normal range. There was no evidence for stenosis. There was no regurgitation. Valve area by pressure half-time: 2.42 cm^2. Indexed valve area by pressure half-time: 1.13 cm^2/m^2. Mean gradient (D): 4 mm Hg. Peak gradient (D): 16 mm Hg.  ------------------------------------------------------------------- Left atrium: The atrium was mildly dilated.  ------------------------------------------------------------------- Right ventricle: The cavity size was mildly dilated. Wall thickness was normal. Systolic function was normal.  ------------------------------------------------------------------- Ventricular septum: Septal motion showed paradox.  ------------------------------------------------------------------- Pulmonic valve: Structurally normal valve. Cusp separation was normal. Doppler: Transvalvular velocity was within the normal range. There was no evidence for stenosis. There was no regurgitation.  ------------------------------------------------------------------- Tricuspid valve: Structurally normal valve. Doppler: Transvalvular velocity was within the normal range. There was mild regurgitation.  ------------------------------------------------------------------- Pulmonary artery: The main pulmonary artery was normal-sized. Systolic pressure was within the normal range.  ------------------------------------------------------------------- Right atrium: The atrium was normal in  size.  ------------------------------------------------------------------- Pericardium: There was no pericardial effusion.  ------------------------------------------------------------------- Systemic veins: Not visualized. Inferior vena cava: The vessel was normal in size.  ------------------------------------------------------------------- Prepared and Electronically Authenticated by  Tobias Alexander, M.D. 2015-08-04T20:37:49  ------------------------------------------------------------------- Measurements  Left ventricle Value 02/02/2014 Reference LV ID, ED, PLAX (L) 41 mm 50.6 43 - 52 chordal LV ID, ES, PLAX (N) 31.7 mm 45.7 23 - 38 chordal LV fx shortening, PLAX (L) 23 % 10 >=29 chordal LV PW thickness, ED 13.4 mm 11.2 --------- IVS/LV PW ratio, ED (N) 1.03 1.04 <=1.3  Ventricular septum Value 02/02/2014 Reference IVS thickness, ED 13.8 mm 11.7 ---------  Aorta Value 02/02/2014 Reference Aortic root ID, ED 26 mm 24 ---------  Left atrium Value 02/02/2014 Reference LA ID, A-P, ES 44 mm 38 --------- LA ID/bsa, A-P (N) 2.06 cm/m^2 1.61 <=2.2  Mitral valve Value 02/02/2014 Reference  Mitral E-wave peak 197 cm/[name] 177 --------- velocity Mitral A-wave peak 64 cm/[name] 83.3 --------- velocity Mitral mean velocity, 87.4 cm/[name] 116 --------- D Mitral deceleration (H) 232 ms 153 150 - 230 time Mitral pressure 91 ms ---------- --------- half-time Mitral mean gradient, 4 mm Hg 7 --------- D Mitral peak gradient, 16 mm Hg 13 --------- D Mitral E/A ratio, peak 3.1 2.1 --------- Mitral valve area, 2.42 cm^2 ---------- --------- PHT, DP Mitral valve area/bsa, 1.13 cm^2/m^2 ---------- --------- PHT, DP Mitral annulus VTI, D 39.3 cm 46.4 ---------  Tricuspid valve Value 02/02/2014 Reference Tricuspid regurg peak 252 cm/[name] 289 --------- velocity Tricuspid peak RV-RA 25 mm Hg 33 --------- gradient Tricuspid maximal 252 cm/[name] 289 --------- regurg velocity, PISA  Legend: (L)  and (H) mark values outside specified reference range.  (N) marks values inside specified reference range.  Assessment/Plan: [name]/P Procedure([name]) (LRB): CORONARY ARTERY BYPASS GRAFTING TIMES FOUR USING LEFT INTERNAL MAMMARY ARTERY TO LAD, SAPHENOUS VEIN GRAFTS TO OM1, OM2, AND PDA (N/A) MITRAL VALVE (MV) REPLACEMENT (N/A)  She remains on Milrinone 0.25 and levophed weaned to 5 mcg from 7 mcg last night. She is off cardizem and on amio now.Co-ox is 52% this am. Her echo has not changed from previous echo with adequate LV and RV function, although on inotropes. There is no MR, mild TR. She does have diastolic dysfunction. She may have difficulty weaning off levophed while on Milrinone if that is causing some peripheral vasodilatation. She may have better hemodynamics in sinus rhythm but may not hold it if cardioverted. It may be worth a try. She was kept off heparin postop due to marked thrombocytopenia and concern about HIT, although studies were eventually negative. She was on argatroban or angiomax for a long time postop. She is now receiving heparin for CRRT and platelet count seems to be stable so I think heparin drip would be ok.   LOS: 24 days    BARTLE,BRYAN K 02/18/2014

## 2014-02-18 NOTE — Progress Notes (Signed)
Patient ID: [name], female   DOB: Jun 03, 1941, 73 y.o.   MRN: [id number]  SICU Evening Rounds:  Borderline BP. Dr. [name] reviewed the echo and feels that the patient has and EF of 35-40 with severe RV systolic dysfunction. Milrinone increased to 0.375 and now levophed on . CVP 11  She is awake and alert. Nods that she is ok.  Tolerating TF at goal.

## 2014-02-18 NOTE — Progress Notes (Signed)
ANTICOAGULATION CONSULT NOTE - Initial Consult  Pharmacy Consult for Heparin Indication: atrial fibrillation  No Known Allergies  Patient Measurements: Height: 5\' 7"  (170.2 cm) Weight: 198 lb 6.6 oz (90 kg) IBW/kg (Calculated) : 61.6 Heparin Dosing Weight: 80.9 kg  Vital Signs: Temp: 97 F (36.1 C) (08/05 1400) BP: 114/50 mmHg (08/05 1345) Pulse Rate: 86 (08/05 1400)  Labs:  Recent Labs  02/16/14 0400  02/17/14 0400 02/17/14 1500 02/18/14 0500  HGB 7.6*  --  8.4*  --  8.9*  HCT 25.2*  --  27.5*  --  29.2*  PLT 184  --  185  --  190  APTT  --   --  113*  --  139*  CREATININE 1.25*  < > 1.21* 1.06 1.12*  < > = values in this interval not displayed.  Estimated Creatinine Clearance: 50.8 ml/min (by C-G formula based on Cr of 1.12).   Medical History: Past Medical History  Diagnosis Date  . History of MI (myocardial infarction)     [facility] (cannot get cath report on Care Everywhere(  . Essential hypertension   . TIA (transient ischemic attack)   . Thyroid disease   . Anxiety   . Diabetes mellitus type 2 with peripheral artery disease     On insulin  . PAD (peripheral artery disease)   . Hyperlipidemia associated with type 2 diabetes mellitus   . A-fib   . ARF (acute renal failure)     Medications:  Scheduled:  . amiodarone  150 mg Intravenous Once  . aspirin  324 mg Per Tube Daily  . bisacodyl  10 mg Oral Daily   Or  . bisacodyl  10 mg Rectal Daily  . chlorhexidine  15 mL Mouth Rinse BID  . feeding supplement (NEPRO CARB STEADY)  1,000 mL Per Tube Q24H  . feeding supplement (PRO-STAT SUGAR FREE 64)  30 mL Per Tube QID  . insulin aspart  0-24 Units Subcutaneous 6 times per day  . insulin glargine  25 Units Subcutaneous BID  . levalbuterol  0.63 mg Nebulization Q6H  . levothyroxine  75 mcg Per Tube QAC breakfast  . pantoprazole sodium  40 mg Per Tube Q1200  . potassium phosphate IVPB (mmol)  10 mmol Intravenous Once  . sodium chloride   10-40 mL Intracatheter Q12H   Infusions:  . sodium chloride 10 mL/hr at 02/18/14 0800  . sodium chloride Stopped (02/18/14 0700)  . amiodarone 30 mg/hr (02/18/14 1242)  . heparin 10,000 units/ 20 mL infusion syringe 1,650 Units/hr (02/18/14 1457)  . milrinone 0.375 mcg/kg/min (02/18/14 1430)  . norepinephrine (LEVOPHED) Adult infusion 4 mcg/min (02/18/14 0800)  . dialysis replacement fluid (prismasate) 300 mL/hr at 02/18/14 1428  . dialysis replacement fluid (prismasate) 200 mL/hr at 02/17/14 1509  . dialysate (PRISMASATE) 2,000 mL/hr at 02/18/14 1044    Assessment: 73 yo F admitted 7/12 with STEMI, Emergent CABG x 4 and new afib. Was originally started on bivalirudin for thrombocytopenia but HIT negative. Has been off of systemic anticoagulation since 7/27 for post-op anemia with requirement of multiple transfusions. H/H remains low but has been relatively stable, plt count has improved to 190 with no reported s/s bleeding. Of note, patient continues on CRRT since 7/29.  Goal of Therapy:  Heparin level 0.3-0.7 units/ml Monitor platelets by anticoagulation protocol: Yes   Plan:  - Start heparin at 1200 units/hr (~15 u/kg/hr) - HL in 8 hours - Continue to monitor CBC, daily HL, and  s/s bleeding  [place], Cayleigh Paull K 02/18/2014,3:58 PM

## 2014-02-19 ENCOUNTER — Inpatient Hospital Stay (HOSPITAL_COMMUNITY): Payer: Medicare Other

## 2014-02-19 LAB — RENAL FUNCTION PANEL
ALBUMIN: 3 g/dL — AB (ref 3.5–5.2)
Anion gap: 14 (ref 5–15)
BUN: 28 mg/dL — AB (ref 6–23)
CALCIUM: 8.9 mg/dL (ref 8.4–10.5)
CO2: 25 mEq/L (ref 19–32)
CREATININE: 1.17 mg/dL — AB (ref 0.50–1.10)
Chloride: 97 mEq/L (ref 96–112)
GFR calc Af Amer: 52 mL/min — ABNORMAL LOW (ref >90)
GFR calc non Af Amer: 45 mL/min — ABNORMAL LOW (ref >90)
GLUCOSE: 197 mg/dL — AB (ref 70–99)
PHOSPHORUS: 2.8 mg/dL (ref 2.3–4.6)
POTASSIUM: 4.3 meq/L (ref 3.7–5.3)
Sodium: 136 mEq/L — ABNORMAL LOW (ref 137–147)

## 2014-02-19 LAB — GLUCOSE, CAPILLARY
Glucose-Capillary: 180 mg/dL — ABNORMAL HIGH (ref 70–99)
Glucose-Capillary: 180 mg/dL — ABNORMAL HIGH (ref 70–99)
Glucose-Capillary: 190 mg/dL — ABNORMAL HIGH (ref 70–99)
Glucose-Capillary: 168 mg/dL — ABNORMAL HIGH (ref 70–99)
Glucose-Capillary: 174 mg/dL — ABNORMAL HIGH (ref 70–99)
Glucose-Capillary: 194 mg/dL — ABNORMAL HIGH (ref 70–99)

## 2014-02-19 LAB — COMPREHENSIVE METABOLIC PANEL
ALT: 23 U/L (ref 0–35)
AST: 36 U/L (ref 0–37)
Albumin: 2.9 g/dL — ABNORMAL LOW (ref 3.5–5.2)
Alkaline Phosphatase: 107 U/L (ref 39–117)
Anion gap: 15 (ref 5–15)
BUN: 28 mg/dL — ABNORMAL HIGH (ref 6–23)
CO2: 25 mEq/L (ref 19–32)
Calcium: 9.1 mg/dL (ref 8.4–10.5)
Chloride: 96 mEq/L (ref 96–112)
Creatinine, Ser: 1.19 mg/dL — ABNORMAL HIGH (ref 0.50–1.10)
GFR calc Af Amer: 51 mL/min — ABNORMAL LOW (ref >90)
GFR calc non Af Amer: 44 mL/min — ABNORMAL LOW (ref >90)
Glucose, Bld: 181 mg/dL — ABNORMAL HIGH (ref 70–99)
Potassium: 4.2 mEq/L (ref 3.7–5.3)
Sodium: 136 mEq/L — ABNORMAL LOW (ref 137–147)
Total Bilirubin: 0.8 mg/dL (ref 0.3–1.2)
Total Protein: 8.3 g/dL (ref 6.0–8.3)

## 2014-02-19 LAB — CBC
HCT: 29 % — ABNORMAL LOW (ref 36.0–46.0)
Hemoglobin: 9.1 g/dL — ABNORMAL LOW (ref 12.0–15.0)
MCH: 30.1 pg (ref 26.0–34.0)
MCHC: 31.4 g/dL (ref 30.0–36.0)
MCV: 96 fL (ref 78.0–100.0)
Platelets: 209 10*3/uL (ref 150–400)
RBC: 3.02 MIL/uL — ABNORMAL LOW (ref 3.87–5.11)
RDW: 19.6 % — ABNORMAL HIGH (ref 11.5–15.5)
WBC: 10.5 10*3/uL (ref 4.0–10.5)

## 2014-02-19 LAB — CARBOXYHEMOGLOBIN
Carboxyhemoglobin: 1.7 % — ABNORMAL HIGH (ref 0.5–1.5)
Carboxyhemoglobin: 1.6 % — ABNORMAL HIGH (ref 0.5–1.5)
Carboxyhemoglobin: 1.9 % — ABNORMAL HIGH (ref 0.5–1.5)
Methemoglobin: 0.8 % (ref 0.0–1.5)
Methemoglobin: 0.8 % (ref 0.0–1.5)
Methemoglobin: 0.8 % (ref 0.0–1.5)
O2 SAT: 51.7 %
O2 Saturation: 49.6 %
O2 Saturation: 44.9 %
TOTAL HEMOGLOBIN: 8.3 g/dL — AB (ref 12.0–16.0)
TOTAL HEMOGLOBIN: 9.9 g/dL — AB (ref 12.0–16.0)
Total hemoglobin: 9.3 g/dL — ABNORMAL LOW (ref 12.0–16.0)

## 2014-02-19 LAB — POCT ACTIVATED CLOTTING TIME
Activated Clotting Time: 214 seconds
Activated Clotting Time: 225 seconds
Activated Clotting Time: 225 seconds
Activated Clotting Time: 230 seconds
Activated Clotting Time: 230 seconds
Activated Clotting Time: 230 seconds
Activated Clotting Time: 225 seconds
Activated Clotting Time: 225 seconds
Activated Clotting Time: 230 seconds
Activated Clotting Time: 214 seconds
Activated Clotting Time: 214 seconds

## 2014-02-19 LAB — HEPARIN LEVEL (UNFRACTIONATED)
Heparin Unfractionated: 1.94 IU/mL — ABNORMAL HIGH (ref 0.30–0.70)
Heparin Unfractionated: <0.1 IU/mL — ABNORMAL LOW (ref 0.30–0.70)
Heparin Unfractionated: <0.1 IU/mL — ABNORMAL LOW (ref 0.30–0.70)

## 2014-02-19 LAB — PHOSPHORUS: Phosphorus: 2.5 mg/dL (ref 2.3–4.6)

## 2014-02-19 LAB — APTT: aPTT: 57 seconds — ABNORMAL HIGH (ref 24–37)

## 2014-02-19 LAB — MAGNESIUM: Magnesium: 2.5 mg/dL (ref 1.5–2.5)

## 2014-02-19 MED ORDER — DOBUTAMINE IN D5W 4-5 MG/ML-% IV SOLN
3.0000 ug/kg/min | INTRAVENOUS | Status: DC
  Administered 2014-02-19: 3 ug/kg/min via INTRAVENOUS
  Filled 2014-02-19 (×2): qty 250

## 2014-02-19 MED ORDER — POTASSIUM PHOSPHATES 15 MMOLE/5ML IV SOLN
10.0000 mmol | Freq: Once | INTRAVENOUS | Status: AC
  Administered 2014-02-19: 10 mmol via INTRAVENOUS
  Filled 2014-02-19: qty 3.33

## 2014-02-19 MED ORDER — HEPARIN (PORCINE) IN NACL 100-0.45 UNIT/ML-% IJ SOLN
1500.0000 [IU]/h | INTRAMUSCULAR | Status: DC
  Administered 2014-02-20: 1500 [IU]/h via INTRAVENOUS
  Administered 2014-02-21: 1600 [IU]/h via INTRAVENOUS
  Administered 2014-02-21: 1700 [IU]/h via INTRAVENOUS
  Administered 2014-02-22 – 2014-02-23 (×2): 1500 [IU]/h via INTRAVENOUS
  Administered 2014-02-23 (×2): 1600 [IU]/h via INTRAVENOUS
  Administered 2014-02-24: 1500 [IU]/h via INTRAVENOUS
  Administered 2014-02-24: 1600 [IU]/h via INTRAVENOUS
  Administered 2014-02-25: 1500 [IU]/h via INTRAVENOUS
  Filled 2014-02-19 (×12): qty 250

## 2014-02-19 MED ORDER — HEPARIN (PORCINE) IN NACL 100-0.45 UNIT/ML-% IJ SOLN
800.0000 [IU]/h | INTRAMUSCULAR | Status: DC
  Administered 2014-02-19 (×2): 800 [IU]/h via INTRAVENOUS
  Filled 2014-02-19: qty 250

## 2014-02-19 NOTE — Progress Notes (Signed)
TCTS BRIEF SICU PROGRESS NOTE  25 Days Post-Op  S/P Procedure(s) (LRB): CORONARY ARTERY BYPASS GRAFTING TIMES FOUR USING LEFT INTERNAL MAMMARY ARTERY TO LAD, SAPHENOUS VEIN GRAFTS TO OM1, OM2, AND PDA (N/A) MITRAL VALVE (MV) REPLACEMENT (N/A)   Did not tolerate dobutamine earlier today - developed tachycardia and hypotension Now on increased dose levophe Afib/Aflutter w/ HR 90's Co-ox 45%  Plan: Continue current plan  OWEN,[name] 02/19/2014 6:15 PM

## 2014-02-19 NOTE — Progress Notes (Signed)
Utilization Review Completed.  

## 2014-02-19 NOTE — Progress Notes (Signed)
Physical Therapy Treatment Patient Details Name: [name] MRN: [id number] DOB: 02-04-1941 Today's Date: 03-08-2014    History of Present Illness Pt adm with MI and underwent emergent CABG x 4 and MVR on 7/13. Post op course complicated by VDRF and bil pleural effusions and requiring CVVHD. Extubated 7/31. PMH - DM, MI, HTN,    PT Comments    Pt able to tolerate transfer to chair with maxisky. Pt able to actively assist moving all extremities. Pt moving lt upper extremity better than rt but unsure if pt nodded when asked if she had shoulder issues in rt shoulder (unsure if she is accurate).  Follow Up Recommendations  LTACH     Equipment Recommendations  Other (comment) (to be determined)    Recommendations for Other Services       Precautions / Restrictions Precautions Precautions: Fall;Other (comment) Precaution Comments: CVVHD in neck, multiple lines/tubes.    Mobility  Bed Mobility Overal bed mobility: Needs Assistance Bed Mobility: Rolling Rolling: +2 for physical assistance;Max assist         General bed mobility comments: Pt able to grasp rail and hold with left hand when turning to the rt.  Transfers                 General transfer comment: Transferred pt bed to chair with maxisky with 2 person assist.  Ambulation/Gait                 Stairs            Wheelchair Mobility    Modified Rankin (Stroke Patients Only)       Balance                                    Cognition Arousal/Alertness: Awake/alert Behavior During Therapy: Flat affect Overall Cognitive Status: Difficult to assess Area of Impairment: Following commands       Following Commands: Follows one step commands inconsistently;Follows one step commands with increased time            Exercises General Exercises - Lower Extremity Ankle Circles/Pumps: AAROM;Both;10 reps;Seated Long Arc Quad: AAROM;Both;10 reps;Seated Hip  ABduction/ADduction: AAROM;Both;10 reps;Seated    General Comments        Pertinent Vitals/Pain Pain Assessment: Faces Faces Pain Scale: No hurt    Home Living                      Prior Function            PT Goals (current goals can now be found in the care plan section) Progress towards PT goals: Progressing toward goals    Frequency  Min 2X/week    PT Plan Current plan remains appropriate    Co-evaluation             End of Session Equipment Utilized During Treatment: Other (comment) (maxisky) Activity Tolerance: Patient tolerated treatment well Patient left: in chair;with call bell/phone within reach;with nursing/sitter in room     Time: 1135-1205 PT Time Calculation (min): 30 min  Charges:  $Therapeutic Exercise: 8-22 mins $Therapeutic Activity: 8-22 mins                    G Codes:      Dafne Nield March 08, 2014, 4:58 PM  [facility] PT [phone]

## 2014-02-19 NOTE — Progress Notes (Signed)
Patient tachycardic up to 126 as well as hypotensive with map dropping from >65 to 55 recently after Dobutamine started. Dr. [name] called made aware. Ordered to turn off Dobutamine and titrate levophed. Will continue to monitor. [name]

## 2014-02-19 NOTE — Progress Notes (Addendum)
Spoke with Dr. [name] about cox of 45. Ordered to increase levophed with the goal of systolic around 110. Will continue to monitor. [name]

## 2014-02-19 NOTE — Progress Notes (Signed)
CVVHD Management.  Metabolically stable though no PO4 done again today.  She is now talking some. Still pulling 100cc/hr.  CXR improving.  Remains on milrinone and levophed.  Will DC heparin thru CVVHD machine as peripheral heparin ordered for A fib.

## 2014-02-19 NOTE — Progress Notes (Signed)
ANTICOAGULATION CONSULT NOTE - Follow Up Consult  Pharmacy Consult for Heparin  Indication: atrial fibrillation  No Known Allergies  Patient Measurements: Height: 5\' 7"  (170.2 cm) Weight: 197 lb 12 oz (89.7 kg) IBW/kg (Calculated) : 61.6  Vital Signs: Temp: 98.2 F (36.8 C) (08/06 1159) Temp src: Axillary (08/06 1159) BP: 105/58 mmHg (08/06 1615) Pulse Rate: 92 (08/06 1615)  Labs:  Recent Labs  02/17/14 0400  02/18/14 0500 02/18/14 1712 02/19/14 0030 02/19/14 0416 02/19/14 1318 02/19/14 1525  HGB 8.4*  --  8.9*  --   --  9.1*  --   --   HCT 27.5*  --  29.2*  --   --  29.0*  --   --   PLT 185  --  190  --   --  209  --   --   APTT 113*  --  139*  --   --   --  57*  --   HEPARINUNFRC  --   --   --   --  1.94*  --   --  <0.10*  CREATININE 1.21*  < > 1.12* 1.10  --  1.19*  --   --   < > = values in this interval not displayed.  Estimated Creatinine Clearance: 47.7 ml/min (by C-G formula based on Cr of 1.19).   Medications:  Heparin 1200 units/hr  Assessment: 73 y/o F on CRRT. Heparin was elevated this AM so rate was decreased. Now, it came back low so I will bump the rate back up.    Goal of Therapy:  Heparin level 0.3-0.7 units/ml Monitor platelets by anticoagulation protocol: Yes   Plan:   Increase heparin to 1000 units/hr Check 6 hr heparin level

## 2014-02-19 NOTE — Progress Notes (Signed)
PULMONARY / CRITICAL CARE MEDICINE  Name: NANAKO STOPHER MRN: [id number] DOB: 05-26-1941    ADMISSION DATE:  01/25/2014 CONSULTATION DATE:  02/19/2014  REFERRING MD :  Tyrone Sage  CHIEF COMPLAINT:  Hypoxemia  INITIAL PRESENTATION: 73 yo admitted 7/12 with STEMI. Taken to cath lab but unable to perform PCI due to significant RCA disease. Taken to OR emergently for CABG x 4.  Returned to SICU mechanically ventilated.  Course c/b failed extubation, pleural effusion, renal failure on CRRT,    STUDIES / EVENTS:  7/12 Left heart cath: occlusion of RCA not amendable to PCI 7/13 CABG x 4 + MVR Zenaida Niece Trigt) 7/20 TTE:  EF 55%, no RWMA, grade 3 DD, PAP 41 torr 7/24 R thoracentesis 1,200 mL 7/25 Extubated 7/27 Left hemithorax opacification. Reintubated 7/27 FOB >>> no overt airway obstruction noted 7/27 CT chest >>> large L effusion, moderate R effusion 7/27 CT guided pleural catheter placed by IR. 1300 cc of serosanguinous fluid  7/28 Renal consult >>> CRRT initiated 7/31: extubated    INTERVAL HISTORY:  More alert.  Weaning levophed.  Remains on milrinone, amio.  Co-ox 49%,. Very deconditioned and has hypoactive delirium  VITAL SIGNS: Temp:  [96.6 F (35.9 C)-98.2 F (36.8 C)] 98.2 F (36.8 C) (08/06 0830) Pulse Rate:  [82-104] 104 (08/06 0830) Resp:  [17-27] 25 (08/06 0830) BP: (80-132)/(36-75) 108/52 mmHg (08/06 0815) SpO2:  [93 %-100 %] 96 % (08/06 0830) Weight:  [197 lb 12 oz (89.7 kg)] 197 lb 12 oz (89.7 kg) (08/06 0500)  HEMODYNAMICS: CVP:  [11 mmHg-12 mmHg] 12 mmHg     INTAKE / OUTPUT: Intake/Output     08/05 0701 - 08/06 0700 08/06 0701 - 08/07 0700   I.V. (mL/kg) 1086.3 (12.1)    Other 360    NG/GT 1020    IV Piggyback 253.3    Total Intake(mL/kg) 2719.6 (30.3)    Urine (mL/kg/hr) 20 (0)    Other 4265 (2)    Stool 650 (0.3)    Total Output 4935     Net -2215.4           PHYSICAL EXAMINATION: General: comfortable,NAD Neuro: mcuh more awake, follows commands, MAE,  gen weakness  HEENT:  Panda tube, mm dry, poor oral hygiene  Cardiovascular: Irreg irreg, no mgr Lungs: resps even non labored on [place], diminished in bases bilaterally, weak cough Abdomen: BS+, soft nontender Ext: Improved edema   LABS: CBC  Recent Labs Lab 02/17/14 0400 02/18/14 0500 02/19/14 0416  WBC 10.7* 8.9 10.5  HGB 8.4* 8.9* 9.1*  HCT 27.5* 29.2* 29.0*  PLT 185 190 209   Coag's  Recent Labs Lab 02/17/14 0400 02/18/14 0500  APTT 113* 139*   BMET  Recent Labs Lab 02/18/14 0500 02/18/14 1712 02/19/14 0416  NA 136* 135* 136*  K 4.2 4.1 4.2  CL 97 97 96  CO2 25 24 25   BUN 25* 27* 28*  CREATININE 1.12* 1.10 1.19*  GLUCOSE 185* 195* 181*   Electrolytes  Recent Labs Lab 02/17/14 0400  02/18/14 0500 02/18/14 1712 02/19/14 0416  CALCIUM 8.2*  < > 8.8 8.8 9.1  MG 2.6*  --  2.6*  --  2.5  PHOS 2.0*  < > 2.4 2.7 2.5  < > = values in this interval not displayed.  Sepsis Markers No results found for this basename: LATICACIDVEN, PROCALCITON, O2SATVEN,  in the last 168 hours  ABG  Recent Labs Lab 02/13/14 1021 02/14/14 1649 02/17/14 1013  PHART 7.462* 7.708* 7.463*  PCO2ART 38.1 19.6* 37.2  PO2ART 90.0 25.0* 78.0*   Liver Enzymes  Recent Labs Lab 02/17/14 0400  02/18/14 0500 02/18/14 1712 02/19/14 0416  AST 30  --  32  --  36  ALT 16  --  20  --  23  ALKPHOS 114  --  109  --  107  BILITOT 0.8  --  0.8  --  0.8  ALBUMIN 2.6*  < > 2.7* 2.7* 2.9*  < > = values in this interval not displayed. Cardiac Enzymes No results found for this basename: TROPONINI, PROBNP,  in the last 168 hours  Glucose  Recent Labs Lab 02/18/14 1226 02/18/14 1644 02/18/14 1924 02/18/14 2329 02/19/14 0322 02/19/14 0751  GLUCAP 203* 192* 193* 196* 180* 180*   IMAGING: Dg Chest Port 1 View  02/18/2014   CLINICAL DATA:  Pulmonary edema  EXAM: PORTABLE CHEST - 1 VIEW  COMPARISON:  February 17, 2014  FINDINGS: Dual-lumen central catheter has its tip in the superior  vena cava. Right central catheter tip is also in the superior vena cava. Feeding tube tip is below the diaphragm and not seen. No pneumothorax. There is less interstitial edema compared to 1 day prior. There is atelectatic change in both lung bases. There are effusions bilaterally, larger on the right than on the left, stable. Cardiomegaly is stable. There is pulmonary venous hypertension. 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  IMPRESSION: Less interstitial edema compared to 1 day prior. Moderate edema remains. Other changes of congestive heart failure are stable. There is no new opacity. No pneumothorax.   Electronically Signed   By: Bretta Bang M.D.   On: 02/18/2014 07:09     ASSESSMENT / PLAN:  PULMONARY  A:  Acute hypoxemic respiratory failure, extubated 7/31 HCAP Large L pleural effusion - s/p pleural cath drainage 7/27 P:  Goal SpO2>92 Supplemental oxygen Flutter valve / incentive spirometry as able Mobilize as able - goal up in chair even if lift required, PT ordered Cont to watch resp status carefully in ICU Continue fluid removal  Chest tubes removed 8/4  CARDIOVASCULAR  A:  Afib with RVR CAD - s/p CABG, MVR  Acute on chronic diastolic heart failure P:  Cont milrinone, low dose levophed CVTS  Heparin gtt  Amiodarone Volume removal with CVVH   RENAL  A:  AKI Volume overload P:  Renal following Cont CRRT with neg balance as tol  Continue Levophed / Milrinone per TCTS Repeat echo unchanged    GASTROINTESTINAL  A:  Protein calorie malnutrition  P:  Cont TF  Consider swallow eval  PPI  HEMATOLOGIC  A:  Anemia with out active bleeding  P:  F/u cbc  Heparin gtt per CVTS   INFECTIOUS  A:  HCAP P:  S/p course abx  Monitor wbc, fever curve off abx   ENDOCRINE  A:  hypothyroid DM P:  SSI, lantus Synthroid    NEUROLOGIC  A:  Derlirium -  chronic?, baseline dementia?   - ongoing + but RN  reports improvement 02/19/14 (Staff MD note) P:  Avoid sedating meds Lights on in daytime, off at night Frequent oritentation Consider swallow eval with improved mentation  Mobilize    Dirk Dress, NP 02/19/2014  8:51 AM Pager: [phone] or [phone]   STAFF NOTE  STAFF NOTE: I, Dr Lavinia Sharps have personally reviewed patient's available data, including medical history, events of note, physical examination and test results as part  of my evaluation. I have discussed with resident/NP and other care providers such as pharmacist, RN and RRT.  In addition,  I personally evaluated patient and elicited key findings of ongoing shock, physical deconditioning, renal failure on CRRT and hypoactive delirium. Continue measures outlined above Rest per NP/medical resident whose note is outlined above and that I agree with  The patient is critically ill with multiple organ systems failure and requires high complexity decision making for assessment and support, frequent evaluation and titration of therapies, application of advanced monitoring technologies and extensive interpretation of multiple databases.   Critical Care Time devoted to patient care services described in this note is  30  Minutes.  Dr. Kalman ShanMurali Poetry Cerro, M.D., [facility].C.C.P Pulmonary and Critical Care Medicine Staff Physician [facility] Pager: [phone], If no answer or between  15:00h - 7:00h: call [phone]  02/19/2014 10:18 AM

## 2014-02-19 NOTE — Progress Notes (Signed)
25 Days Post-Op Procedure(s) (LRB): CORONARY ARTERY BYPASS GRAFTING TIMES FOUR USING LEFT INTERNAL MAMMARY ARTERY TO LAD, SAPHENOUS VEIN GRAFTS TO OM1, OM2, AND PDA (N/A) MITRAL VALVE (MV) REPLACEMENT (N/A) Subjective:  Patient and awake responsive . Follows commands. She will squeeze my hand a little.very soft voice  Objective: Vital signs in last 24 hours: Temp:  [96.6 F (35.9 C)-98.1 F (36.7 C)] 97.7 F (36.5 C) (08/06 0752) Pulse Rate:  [82-101] 98 (08/06 0715) Cardiac Rhythm:  [-] Atrial fibrillation (08/05 2000) Resp:  [17-27] 23 (08/06 0715) BP: (80-126)/(36-75) 111/54 mmHg (08/06 0600) SpO2:  [93 %-100 %] 98 % (08/06 0715) Weight:  [197 lb 12 oz (89.7 kg)] 197 lb 12 oz (89.7 kg) (08/06 0500)  Hemodynamic parameters for last 24 hours: CVP:  [11 mmHg-12 mmHg] 12 mmHg  Intake/Output from previous day: 08/05 0701 - 08/06 0700 In: 2719.6 [I.V.:1086.3; NG/GT:1020; IV Piggyback:253.3] Out: 4935 [Urine:20; Stool:650] Intake/Output this shift:    Heart: irregularly irregular rhythm Lungs: clear to auscultation bilaterally Abdomen: soft, non-tender; bowel sounds normal; no masses,  no organomegaly Extremities: extremities normal, atraumatic, no cyanosis or edema, incisions intact Wound: incisions ok  Lab Results:  Recent Labs  02/18/14 0500 02/19/14 0416  WBC 8.9 10.5  HGB 8.9* 9.1*  HCT 29.2* 29.0*  PLT 190 209   BMET:   Recent Labs  02/18/14 1712 02/19/14 0416  NA 135* 136*  K 4.1 4.2  CL 97 96  CO2 24 25  GLUCOSE 195* 181*  BUN 27* 28*  CREATININE 1.10 1.19*  CALCIUM 8.8 9.1    PT/INR: No results found for this basename: LABPROT, INR,  in the last 72 hours ABG    Component Value Date/Time   PHART 7.463* 02/17/2014 1013   HCO3 26.6* 02/17/2014 1013   TCO2 28 02/17/2014 1013   ACIDBASEDEF 0.1 01/30/2014 0339   O2SAT 49.6 02/19/2014 0406   CBG (last 3)   Recent Labs  02/18/14 1924 02/18/14 2329 02/19/14 0322  GLUCAP 193* 196* 180*   *[facility]* *[facility]* [address] [place], [place] [address] [phone]  ------------------------------------------------------------------- Transthoracic Echocardiography  Patient: [name], [name] MR #: [id number] Study Date: 02/17/2014 Gender: F Age: 73 Height: 170.2 cm Weight: 93.4 kg BSA: 2.13 m^2 Pt. Status: Room: 2S04C  ATTENDING Lovett Sox SONOGRAPHER Melissa Morford, RDCS ORDERING Ulla Potash B PERFORMING [facility], Inpatient  cc:  ------------------------------------------------------------------- LV EF: 45% - 50%  ------------------------------------------------------------------- Indications: CHF - 428.0.  ------------------------------------------------------------------- History: PMH: Atrial fibrillation. Coronary artery disease. Coronary artery disease. PMH: Myocardial infarction. Risk factors: Hypertension. Diabetes mellitus.  ------------------------------------------------------------------- Study Conclusions  - Left ventricle: The cavity size was normal. There was moderate concentric hypertrophy. Systolic function was mildly reduced. The estimated ejection fraction was in the range of 45% to 50%. Wall motion was normal; there were no regional wall motion abnormalities. Doppler parameters are consistent with a restrictive pattern, indicative of decreased left ventricular diastolic compliance and/or increased left atrial pressure (grade 3 diastolic dysfunction). - Ventricular septum: Septal motion showed paradox. - Aortic root: The aortic root was normal in size. - Mitral valve: Mechanical mitral valve appears to be functioning normally. There is no mitral regurgitation or paravalvular leak. Transmitral gradients are normal for this type of prosthesis. Mobility was not restricted. Valve area by pressure half-time: 2.42 cm^2. - Left atrium: The atrium was mildly dilated. - Right ventricle: The cavity size was mildly dilated.  Wall thickness was normal. Systolic function was normal. - Tricuspid valve: There  was mild regurgitation.  Impressions:  - There is no significant change when compared to the prior study from 02/02/2014. Pulmonary hypertension can&'t be acurately estimated on the current study.  ------------------------------------------------------------------- Labs, prior tests, procedures, and surgery: Coronary artery bypass grafting (current admission).  Valve surgery (current admission). Mitral valve replacement. Transthoracic echocardiography. M-mode, complete 2D, spectral Doppler, and color Doppler. Birthdate: Patient birthdate: 08/11/1940. Age: Patient is 73 yr old. Sex: Gender: female. Height: Height: 170.2 cm. Height: 67 in. Weight: Weight: 93.4 kg. Weight: 205.5 lb. Body mass index: BMI: 32.2 kg/m^2. Body surface area: BSA: 2.13 m^2. Blood pressure: 93/54 Patient status: Inpatient. Study date: Study date: 02/17/2014. Study time: 02:55 PM.  -------------------------------------------------------------------  ------------------------------------------------------------------- Left ventricle: The cavity size was normal. There was moderate concentric hypertrophy. Systolic function was mildly reduced. The estimated ejection fraction was in the range of 45% to 50%. Wall motion was normal; there were no regional wall motion abnormalities. Doppler parameters are consistent with a restrictive pattern, indicative of decreased left ventricular diastolic compliance and/or increased left atrial pressure (grade 3 diastolic dysfunction).  ------------------------------------------------------------------- Aortic valve: Trileaflet; mildly thickened leaflets. Mobility was not restricted. Doppler: Transvalvular velocity was within the normal range. There was no stenosis. There was no regurgitation.  ------------------------------------------------------------------- Aorta: Aortic root: The aortic  root was normal in size.  ------------------------------------------------------------------- Mitral valve: Mechanical mitral valve appears to be functioning normally. There is no mitral regurgitation or paravalvular leak. Transmitral gradients are normal for this type of prosthesis. Mobility was not restricted. Doppler: Transvalvular velocity was within the normal range. There was no evidence for stenosis. There was no regurgitation. Valve area by pressure half-time: 2.42 cm^2. Indexed valve area by pressure half-time: 1.13 cm^2/m^2. Mean gradient (D): 4 mm Hg. Peak gradient (D): 16 mm Hg.  ------------------------------------------------------------------- Left atrium: The atrium was mildly dilated.  ------------------------------------------------------------------- Right ventricle: The cavity size was mildly dilated. Wall thickness was normal. Systolic function was normal.  ------------------------------------------------------------------- Ventricular septum: Septal motion showed paradox.  ------------------------------------------------------------------- Pulmonic valve: Structurally normal valve. Cusp separation was normal. Doppler: Transvalvular velocity was within the normal range. There was no evidence for stenosis. There was no regurgitation.  ------------------------------------------------------------------- Tricuspid valve: Structurally normal valve. Doppler: Transvalvular velocity was within the normal range. There was mild regurgitation.  ------------------------------------------------------------------- Pulmonary artery: The main pulmonary artery was normal-sized. Systolic pressure was within the normal range.  ------------------------------------------------------------------- Right atrium: The atrium was normal in size.  ------------------------------------------------------------------- Pericardium: There was no pericardial  effusion.  ------------------------------------------------------------------- Systemic veins: Not visualized. Inferior vena cava: The vessel was normal in size.  ------------------------------------------------------------------- Prepared and Electronically Authenticated by  Tobias Alexander, M.D. 2015-08-04T20:37:49  ------------------------------------------------------------------- Measurements  Left ventricle Value 02/02/2014 Reference LV ID, ED, PLAX (L) 41 mm 50.6 43 - 52 chordal LV ID, ES, PLAX (N) 31.7 mm 45.7 23 - 38 chordal LV fx shortening, PLAX (L) 23 % 10 >=29 chordal LV PW thickness, ED 13.4 mm 11.2 --------- IVS/LV PW ratio, ED (N) 1.03 1.04 <=1.3  Ventricular septum Value 02/02/2014 Reference IVS thickness, ED 13.8 mm 11.7 ---------  Aorta Value 02/02/2014 Reference Aortic root ID, ED 26 mm 24 ---------  Left atrium Value 02/02/2014 Reference LA ID, A-P, ES 44 mm 38 --------- LA ID/bsa, A-P (N) 2.06 cm/m^2 1.61 <=2.2  Mitral valve Value 02/02/2014 Reference Mitral E-wave peak 197 cm/s 177 --------- velocity Mitral A-wave peak 64 cm/s 83.3 --------- velocity Mitral mean velocity, 87.4 cm/s 116 --------- D Mitral deceleration (H) 232 ms 153 150 - 230 time Mitral pressure 91 ms ---------- ---------  half-time Mitral mean gradient, 4 mm Hg 7 --------- D Mitral peak gradient, 16 mm Hg 13 --------- D Mitral E/A ratio, peak 3.1 2.1 --------- Mitral valve area, 2.42 cm^2 ---------- --------- PHT, DP Mitral valve area/bsa, 1.13 cm^2/m^2 ---------- --------- PHT, DP Mitral annulus VTI, D 39.3 cm 46.4 ---------  Tricuspid valve Value 02/02/2014 Reference Tricuspid regurg peak 252 cm/s 289 --------- velocity Tricuspid peak RV-RA 25 mm Hg 33 --------- gradient Tricuspid maximal 252 cm/s 289 --------- regurg velocity, PISA  Legend: (L) and (H) mark values outside specified reference range.  (N) marks values inside specified reference  range.  Assessment/Plan: S/P Procedure(s) (LRB): CORONARY ARTERY BYPASS GRAFTING TIMES FOUR USING LEFT INTERNAL MAMMARY ARTERY TO LAD, SAPHENOUS VEIN GRAFTS TO OM1, OM2, AND PDA (N/A) MITRAL VALVE (MV) REPLACEMENT (N/A)  She remains on Milrinone 0.25 and levophed weaned to 5 mcg from 7 mcg last night. She is off cardizem and on amio now.Co-ox is 49 % this am. The echo reports are  Inconsistent, adequate LV with significant  RV function dysfunction, and still  on inotropes. There is no MR, mild TR. She does have diastolic dysfunction.  Still in afib back on cordarone , was stopped last week due to bradycardia   She was kept off heparin postop due to marked thrombocytopenia and concern about HIT, although studies were eventually negative. She was on argatroban or angiomax for a long time postop.  She has been  receiving heparin for CRRT and iv heparin, heparin levels elevated  and platelet count seems to be stable Heparin via cvvh stopped    LOS: 25 days    [name] B 02/19/2014

## 2014-02-19 NOTE — Progress Notes (Addendum)
ANTICOAGULATION CONSULT NOTE - Follow Up Consult  Pharmacy Consult for Heparin  Indication: atrial fibrillation  No Known Allergies  Patient Measurements: Height: 5\' 7"  (170.2 cm) Weight: 197 lb 12 oz (89.7 kg) IBW/kg (Calculated) : 61.6  Vital Signs: Temp: 98.1 F (36.7 C) (08/06 2356) Temp src: Axillary (08/06 2356) BP: 102/64 mmHg (08/06 2330) Pulse Rate: 97 (08/06 2330)  Labs:  Recent Labs  02/17/14 0400  02/18/14 0500 02/18/14 1712 02/19/14 0030 02/19/14 0416 02/19/14 1318 02/19/14 1525 02/19/14 1713 02/19/14 2300  HGB 8.4*  --  8.9*  --   --  9.1*  --   --   --   --   HCT 27.5*  --  29.2*  --   --  29.0*  --   --   --   --   PLT 185  --  190  --   --  209  --   --   --   --   APTT 113*  --  139*  --   --   --  57*  --   --   --   HEPARINUNFRC  --   --   --   --  1.94*  --   --  <0.10*  --  <0.10*  CREATININE 1.21*  < > 1.12* 1.10  --  1.19*  --   --  1.17*  --   < > = values in this interval not displayed.  Estimated Creatinine Clearance: 48.5 ml/min (by C-G formula based on Cr of 1.17).   Medications:  Heparin 1000 units/hr  Assessment: 73 y/o F on CRRT with SUB-therapeutic heparin level likely due to heparin syringe with CRRT being DC'd. Other labs as above. No issues per RN.   Goal of Therapy:  Heparin level 0.3-0.7 units/ml Monitor platelets by anticoagulation protocol: Yes   Plan:  -Increase drip to 1250 units/hr  -AM HL -Daily CBC/HL -Monitor for bleeding  Abran Duke 02/19/2014,11:59 PM  ================================== 5:36 AM HL this AM is still undetectable -Increase heparin to 1500 units/hr -1330 HL JLedford, PharmD

## 2014-02-19 NOTE — Progress Notes (Signed)
ANTICOAGULATION CONSULT NOTE - Follow Up Consult  Pharmacy Consult for Heparin  Indication: atrial fibrillation  No Known Allergies  Patient Measurements: Height: 5\' 7"  (170.2 cm) Weight: 197 lb 12 oz (89.7 kg) IBW/kg (Calculated) : 61.6  Vital Signs: Temp: 97.7 F (36.5 C) (08/06 0500) BP: 109/63 mmHg (08/06 0500) Pulse Rate: 97 (08/06 0500)  Labs:  Recent Labs  02/17/14 0400  02/18/14 0500 02/18/14 1712 02/19/14 0030 02/19/14 0416  HGB 8.4*  --  8.9*  --   --  9.1*  HCT 27.5*  --  29.2*  --   --  29.0*  PLT 185  --  190  --   --  209  APTT 113*  --  139*  --   --   --   HEPARINUNFRC  --   --   --   --  1.94*  --   CREATININE 1.21*  < > 1.12* 1.10  --  1.19*  < > = values in this interval not displayed.  Estimated Creatinine Clearance: 47.7 ml/min (by C-G formula based on Cr of 1.19).   Medications:  Heparin 1200 units/hr  Assessment: 73 y/o F on CRRT with SUPRA-therapeutic heparin level of 1.94 (drawn correctly in opposite arm from peripheral heparin infusion). Elevated HL likely 2/2 combination of heparin drip/CRRT heparin syringe. Other labs as above.   Goal of Therapy:  Heparin level 0.3-0.7 units/ml Monitor platelets by anticoagulation protocol: Yes   Plan:  -Hold heparin x 1 hour -Re-start heparin drip at 800 units/hr at 0715 -1500 HL -Daily CBC/HL -Monitor for bleeding  Abran Duke 02/19/2014,6:12 AM

## 2014-02-19 NOTE — Progress Notes (Signed)
Dr. [name] called with results of most recent cox. Told to keep levophed at current rate. No new orders at this time.  Will continue to monitor.  [name]

## 2014-02-19 NOTE — Progress Notes (Signed)
Advanced Heart Failure Rounding Note   Subjective:    [name] is a 73 yo female with a history of CAD s/p PCI at [facility], HTN, TIA, DM2 and PAD. [facility] with CP,Vtach and ST changes in inferior leads and was taken to the cath lab. She had severe multivessel CAD involving 100% distal RCA with tandem 90% & 80% lesions, proximal D1 100%, proximal LAD & AVGroove Circ 90% with 80% OM2 and was evaluated by Dr. Maren [name] for CABG and IABP was placed.  Underwent emergent CABGx4 and MV replacement (LIMA-LAD, SVG to OM1, SVG to OM2 and SVG to PDA) on 01/26/14. She was transferred to the ICU on multiple pressors and IABP. UOP was sluggish and remained volume overloaded and lasix gtt was started. IABP removed 7/17. TF started for nutrition. Had difficulty with thrombocytopenia which improved and HIT studies negative and Coumadin was restarted. Underwent thoracentesis on R on 02/06/14. Extubated 7/25 but re-intubated 7/27 electively for therapeutic bronchoscopy showing no significant mucus, airways clear w/ exception of what appeared to be suction trauma at the inlet of the RUL. Underwent L CT by IR on 7/27. Her Creatinine was stable for awhile and then started trending up even on milrinone. Underwent HD cath placement 7/28 and started on CVVHD on 7/29. Extubated again 02/13/14. Has had issues with Afib/Aflutter.   ECHO 02/17/14. Reviewed personally EF 35-40% Inferior wall out. RV moderately HK with TAPSE 1.18cm  Remains on CVVHD, milrinone, levophed and amiodarone. Milrinone increased yesterday for low co-ox. BP dropped last night and levophed increased to 7. Up in chair today. Follows commands and speaks with soft voice.   Objective:   Weight Range:  Vital Signs:   Temp:  [96.6 F (35.9 C)-98.2 F (36.8 C)] 98.2 F (36.8 C) (08/06 1159) Pulse Rate:  [83-104] 98 (08/06 1030) Resp:  [19-28] 23 (08/06 1030) BP: (80-132)/(38-75) 99/49 mmHg (08/06 1030) SpO2:  [93 %-100 %] 97 % (08/06  1030) Weight:  [89.7 kg (197 lb 12 oz)] 89.7 kg (197 lb 12 oz) (08/06 0500) Last BM Date: 02/19/14  Weight change: Filed Weights   02/17/14 0545 02/18/14 0600 02/19/14 0500  Weight: 93.4 kg (205 lb 14.6 oz) 90 kg (198 lb 6.6 oz) 89.7 kg (197 lb 12 oz)    Intake/Output:   Intake/Output Summary (Last 24 hours) at 02/19/14 1210 Last data filed at 02/19/14 1100  Gross per 24 hour  Intake 2679.15 ml  Output   4380 ml  Net -1700.85 ml     Physical Exam: CVP 12 with prominent CV waves General:  Chronically ill appearing. No resp difficulty HEENT: normal; Panda intact; L IJ HD cath Neck: supple. JVP difficult to assess d/t body habiuts but appears mildly elevated with CV waves . Carotids 2+ bilat; no bruits. No lymphadenopathy or thryomegaly appreciated. Cor: PMI nondisplaced. Regular rate & irregular rhythm. No rubs, gallops or murmurs. Lungs: Diminished in the bases Abdomen: soft, nontender, nondistended. No hepatosplenomegaly. No bruits or masses. Good bowel sounds. Extremities: no cyanosis, clubbing, rash, tr bilateral edema; R 3L PICC Neuro: alert. Follows commands. Will whisper her name but o/w wont talk.  moves all 4 extremities w/o difficulty. Affect flat   Telemetry: Afib/flutter 80-90s  Labs: Basic Metabolic Panel:  Recent Labs Lab 02/16/14 0400  02/17/14 0400 02/17/14 1500 02/17/14 1628 02/18/14 0500 02/18/14 1712 02/19/14 0416  NA 139  < > 137 130*  --  136* 135* 136*  K 3.9  < > 3.9 6.2* 3.8  4.2 4.1 4.2  CL 102  < > 100 93*  --  97 97 96  CO2 26  < > 25 23  --  25 24 25   GLUCOSE 117*  < > 100* 380*  --  185* 195* 181*  BUN 26*  < > 26* 27*  --  25* 27* 28*  CREATININE 1.25*  < > 1.21* 1.06  --  1.12* 1.10 1.19*  CALCIUM 8.1*  < > 8.2* 7.7*  --  8.8 8.8 9.1  MG 2.5  --  2.6*  --   --  2.6*  --  2.5  PHOS  --   < > 2.0* 7.9*  --  2.4 2.7 2.5  < > = values in this interval not displayed.  Liver Function Tests:  Recent Labs Lab 02/15/14 0400   02/16/14 0400  02/17/14 0400 02/17/14 1500 02/18/14 0500 02/18/14 1712 02/19/14 0416  AST 19  --  23  --  30  --  32  --  36  ALT 14  --  13  --  16  --  20  --  23  ALKPHOS 88  --  92  --  114  --  109  --  107  BILITOT 0.6  --  0.6  --  0.8  --  0.8  --  0.8  PROT 7.3  --  7.4  --  7.5  --  8.0  --  8.3  ALBUMIN 2.4*  < > 2.4*  < > 2.6* 2.4* 2.7* 2.7* 2.9*  < > = values in this interval not displayed. No results found for this basename: LIPASE, AMYLASE,  in the last 168 hours No results found for this basename: AMMONIA,  in the last 168 hours  CBC:  Recent Labs Lab 02/15/14 0400 02/16/14 0400 02/17/14 0400 02/18/14 0500 02/19/14 0416  WBC 10.0 10.2 10.7* 8.9 10.5  HGB 7.8* 7.6* 8.4* 8.9* 9.1*  HCT 25.6* 25.2* 27.5* 29.2* 29.0*  MCV 98.1 98.4 95.8 98.0 96.0  PLT 190 184 185 190 209    Cardiac Enzymes: No results found for this basename: CKTOTAL, CKMB, CKMBINDEX, TROPONINI,  in the last 168 hours  BNP: BNP (last 3 results) No results found for this basename: PROBNP,  in the last 8760 hours   Other results:  EKG: AF  Imaging: Dg Chest Portable 1 View  02/19/2014   CLINICAL DATA:  Pleural effusions.  Pulmonary edema.  EXAM: PORTABLE CHEST - 1 VIEW  COMPARISON:  02/18/2014 and 02/17/2014  FINDINGS: Pulmonary edema has resolved. There small bilateral pleural effusions, diminished. Pulmonary vascularity is now normal. Prosthetic mitral valve noted. CABG.  Feeding tube tip is below the diaphragm. PICC tip is at the cavoatrial junction.  IMPRESSION: Pulmonary edema resolved.  Decreasing pleural effusions.   Electronically Signed   By: Geanie Cooley M.D.   On: 02/19/2014 07:16   Dg Chest Port 1 View  02/18/2014   CLINICAL DATA:  Pulmonary edema  EXAM: PORTABLE CHEST - 1 VIEW  COMPARISON:  February 17, 2014  FINDINGS: Dual-lumen central catheter has its tip in the superior vena cava. Right central catheter tip is also in the superior vena cava. Feeding tube tip is below the  diaphragm and not seen. No pneumothorax. There is less interstitial edema compared to 1 day prior. There is atelectatic change in both lung bases. There are effusions bilaterally, larger on the right than on the left, stable. Cardiomegaly is stable. There is pulmonary venous hypertension.  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  IMPRESSION: Less interstitial edema compared to 1 day prior. Moderate edema remains. Other changes of congestive heart failure are stable. There is no new opacity. No pneumothorax.   Electronically Signed   By: Bretta Bang M.D.   On: 02/18/2014 07:09     Medications:     Scheduled Medications: . aspirin  324 mg Per Tube Daily  . bisacodyl  10 mg Oral Daily   Or  . bisacodyl  10 mg Rectal Daily  . chlorhexidine  15 mL Mouth Rinse BID  . feeding supplement (NEPRO CARB STEADY)  1,000 mL Per Tube Q24H  . feeding supplement (PRO-STAT SUGAR FREE 64)  30 mL Per Tube QID  . insulin aspart  0-24 Units Subcutaneous 6 times per day  . insulin glargine  25 Units Subcutaneous BID  . levalbuterol  0.63 mg Nebulization Q6H  . levothyroxine  75 mcg Per Tube QAC breakfast  . pantoprazole sodium  40 mg Per Tube Q1200  . potassium phosphate IVPB (mmol)  10 mmol Intravenous Once  . sodium chloride  10-40 mL Intracatheter Q12H    Infusions: . sodium chloride 10 mL/hr at 02/19/14 1000  . sodium chloride Stopped (02/18/14 0700)  . amiodarone 30 mg/hr (02/19/14 1000)  . heparin 800 Units/hr (02/19/14 1000)  . milrinone 0.375 mcg/kg/min (02/19/14 1000)  . norepinephrine (LEVOPHED) Adult infusion 8 mcg/min (02/19/14 1000)  . dialysis replacement fluid (prismasate) 300 mL/hr at 02/19/14 0730  . dialysis replacement fluid (prismasate) 200 mL/hr at 02/17/14 1509  . dialysate (PRISMASATE) 2,000 mL/hr at 02/19/14 1048    PRN Medications: acetaminophen, heparin, heparin, ondansetron (ZOFRAN) IV, oxyCODONE, sodium chloride, sodium  chloride   Assessment:   1) CAD - s/p CABG x 4 with MV replacement 2) A/C respiratory failure 3) AKI - On CVVHD 4) Cardiogenic shock 5) Afib/A Flutter 6) L Pleural effusion - s/p L CT 7) Anemia 8) Delerium, acute 9) ? 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 Shirlee Latch as well. She has had a large inferior MI with RV involvement. Although RV only looks moderately down on echo TAPSE is very low c/w severe RV failure and is likely the source of her ongoing cardiogenic shock.   Co-ox remains low (49%) despite increasing milrinone. Will stop milrinone and change to dobutamine to see if this helps RV any better. Main issue is whether or not her kidneys will recover. I think likelihood is low but if there is going to be any chance of recovery we need to optimize co-ox and keep MAPs >=65 using dobutamine and levophed as needed. I doubt she will be able to tolerate intermittent HD unless we improve her hemodynamics significantly. With RV failure would adjust CVVHD rate to keep CVP 8-12 range with RV failure. Would not go lower.   Continue amio and heparin for AFL.    Making some progress from functional perspective. Continue PT. R shoulder seems somewhat weak with limited ROM.   We will follow.   The patient is critically ill with multiple organ systems failure and requires high complexity decision making for assessment and support, frequent evaluation and titration of therapies, application of advanced monitoring technologies and extensive interpretation of multiple databases.   Critical Care Time personally devoted to patient care services described in this note is 35 Minutes.  Jakori Burkett,MD 12:10 PM

## 2014-02-20 LAB — CARBOXYHEMOGLOBIN
CARBOXYHEMOGLOBIN: 1.5 % (ref 0.5–1.5)
Carboxyhemoglobin: 1.9 % — ABNORMAL HIGH (ref 0.5–1.5)
Carboxyhemoglobin: 1.5 % (ref 0.5–1.5)
Carboxyhemoglobin: 1.5 % (ref 0.5–1.5)
Carboxyhemoglobin: 1.3 % (ref 0.5–1.5)
Carboxyhemoglobin: 1.5 % (ref 0.5–1.5)
METHEMOGLOBIN: 0.8 % (ref 0.0–1.5)
METHEMOGLOBIN: 0.8 % (ref 0.0–1.5)
METHEMOGLOBIN: 0.7 % (ref 0.0–1.5)
Methemoglobin: 1.1 % (ref 0.0–1.5)
Methemoglobin: 1 % (ref 0.0–1.5)
Methemoglobin: 0.7 % (ref 0.0–1.5)
O2 SAT: 36.1 %
O2 SAT: 40.7 %
O2 SAT: 41.9 %
O2 Saturation: 51.4 %
O2 Saturation: 41.6 %
O2 Saturation: 31 %
TOTAL HEMOGLOBIN: 10.8 g/dL — AB (ref 12.0–16.0)
TOTAL HEMOGLOBIN: 10.8 g/dL — AB (ref 12.0–16.0)
TOTAL HEMOGLOBIN: 10.6 g/dL — AB (ref 12.0–16.0)
Total hemoglobin: 10.3 g/dL — ABNORMAL LOW (ref 12.0–16.0)
Total hemoglobin: 12.1 g/dL (ref 12.0–16.0)
Total hemoglobin: 10 g/dL — ABNORMAL LOW (ref 12.0–16.0)

## 2014-02-20 LAB — RENAL FUNCTION PANEL
ANION GAP: 15 (ref 5–15)
Albumin: 3 g/dL — ABNORMAL LOW (ref 3.5–5.2)
Albumin: 3.2 g/dL — ABNORMAL LOW (ref 3.5–5.2)
Anion gap: 16 — ABNORMAL HIGH (ref 5–15)
BUN: 28 mg/dL — AB (ref 6–23)
BUN: 30 mg/dL — ABNORMAL HIGH (ref 6–23)
CALCIUM: 9.2 mg/dL (ref 8.4–10.5)
CHLORIDE: 97 meq/L (ref 96–112)
CO2: 25 mEq/L (ref 19–32)
CO2: 24 mEq/L (ref 19–32)
Calcium: 9.3 mg/dL (ref 8.4–10.5)
Chloride: 95 mEq/L — ABNORMAL LOW (ref 96–112)
Creatinine, Ser: 1.22 mg/dL — ABNORMAL HIGH (ref 0.50–1.10)
Creatinine, Ser: 1.2 mg/dL — ABNORMAL HIGH (ref 0.50–1.10)
GFR calc Af Amer: 49 mL/min — ABNORMAL LOW (ref >90)
GFR calc non Af Amer: 43 mL/min — ABNORMAL LOW (ref >90)
GFR, EST AFRICAN AMERICAN: 50 mL/min — AB (ref >90)
GFR, EST NON AFRICAN AMERICAN: 43 mL/min — AB (ref >90)
GLUCOSE: 207 mg/dL — AB (ref 70–99)
Glucose, Bld: 156 mg/dL — ABNORMAL HIGH (ref 70–99)
PHOSPHORUS: 3.1 mg/dL (ref 2.3–4.6)
POTASSIUM: 4.5 meq/L (ref 3.7–5.3)
POTASSIUM: 4.6 meq/L (ref 3.7–5.3)
Phosphorus: 2.6 mg/dL (ref 2.3–4.6)
SODIUM: 135 meq/L — AB (ref 137–147)
Sodium: 137 mEq/L (ref 137–147)

## 2014-02-20 LAB — CBC
HCT: 31.9 % — ABNORMAL LOW (ref 36.0–46.0)
Hemoglobin: 9.9 g/dL — ABNORMAL LOW (ref 12.0–15.0)
MCH: 29.9 pg (ref 26.0–34.0)
MCHC: 31 g/dL (ref 30.0–36.0)
MCV: 96.4 fL (ref 78.0–100.0)
Platelets: 185 10*3/uL (ref 150–400)
RBC: 3.31 MIL/uL — ABNORMAL LOW (ref 3.87–5.11)
RDW: 19.6 % — ABNORMAL HIGH (ref 11.5–15.5)
WBC: 9.6 10*3/uL (ref 4.0–10.5)

## 2014-02-20 LAB — GLUCOSE, CAPILLARY
Glucose-Capillary: 137 mg/dL — ABNORMAL HIGH (ref 70–99)
Glucose-Capillary: 176 mg/dL — ABNORMAL HIGH (ref 70–99)
Glucose-Capillary: 175 mg/dL — ABNORMAL HIGH (ref 70–99)
Glucose-Capillary: 175 mg/dL — ABNORMAL HIGH (ref 70–99)
Glucose-Capillary: 202 mg/dL — ABNORMAL HIGH (ref 70–99)

## 2014-02-20 LAB — HEPARIN LEVEL (UNFRACTIONATED)
Heparin Unfractionated: <0.1 IU/mL — ABNORMAL LOW (ref 0.30–0.70)
Heparin Unfractionated: 0.22 IU/mL — ABNORMAL LOW (ref 0.30–0.70)

## 2014-02-20 LAB — MAGNESIUM: MAGNESIUM: 2.7 mg/dL — AB (ref 1.5–2.5)

## 2014-02-20 MED ORDER — SODIUM CHLORIDE 0.9 % IV BOLUS (SEPSIS)
500.0000 mL | Freq: Once | INTRAVENOUS | Status: AC
  Administered 2014-02-20: 500 mL via INTRAVENOUS

## 2014-02-20 MED ORDER — POTASSIUM PHOSPHATES 15 MMOLE/5ML IV SOLN
10.0000 mmol | Freq: Once | INTRAVENOUS | Status: AC
  Administered 2014-02-20: 10 mmol via INTRAVENOUS
  Filled 2014-02-20: qty 3.33

## 2014-02-20 MED ORDER — MILRINONE IN DEXTROSE 20 MG/100ML IV SOLN
0.1250 ug/kg/min | INTRAVENOUS | Status: DC
  Administered 2014-02-20 – 2014-02-23 (×5): 0.25 ug/kg/min via INTRAVENOUS
  Administered 2014-02-23: 0.125 ug/kg/min via INTRAVENOUS
  Filled 2014-02-20 (×5): qty 100

## 2014-02-20 MED ORDER — SODIUM CHLORIDE 0.9 % IV BOLUS (SEPSIS)
500.0000 mL | Freq: Once | INTRAVENOUS | Status: AC
  Administered 2014-02-21: 500 mL via INTRAVENOUS

## 2014-02-20 NOTE — Progress Notes (Signed)
CVVHD Management.  Metabolically stable.  PO4 2.6 but expect it to drop so will give KPO4 today. Last CVP 9.  Will pull 50cc/hr unless CVP>12 then pull 100.  Off milrinone but on more levophed.

## 2014-02-20 NOTE — Progress Notes (Signed)
Advanced Heart Failure Rounding Note   Subjective:    Ms. Fortino Sicunn is a 73 yo female with a history of CAD s/p PCI at [facility], HTN, TIA, DM2 and PAD. [facility] with CP,Vtach and ST changes in inferior leads and was taken to the cath lab. She had severe multivessel CAD involving 100% distal RCA with tandem 90% & 80% lesions, proximal D1 100%, proximal LAD & AVGroove Circ 90% with 80% OM2 and was evaluated by Dr. Maren BeachVanTrigt for CABG and IABP was placed.  Underwent emergent CABGx4 and MV replacement (LIMA-LAD, SVG to OM1, SVG to OM2 and SVG to PDA) on 01/26/14. She was transferred to the ICU on multiple pressors and IABP. UOP was sluggish and remained volume overloaded and lasix gtt was started. IABP removed 7/17. TF started for nutrition. Had difficulty with thrombocytopenia which improved and HIT studies negative and Coumadin was restarted. Underwent thoracentesis on R on 02/06/14. Extubated 7/25 but re-intubated 7/27 electively for therapeutic bronchoscopy showing no significant mucus, airways clear w/ exception of what appeared to be suction trauma at the inlet of the RUL. Underwent L CT by IR on 7/27. Her Creatinine was stable for awhile and then started trending up even on milrinone. Underwent HD cath placement 7/28 and started on CVVHD on 7/29. Extubated again 02/13/14. Has had issues with Afib/Aflutter.   ECHO 02/17/14. Reviewed personally EF 35-40% Inferior wall out. RV moderately HK with TAPSE 1.18cm  Remains on CVVHD. Switched milrinone to dobutamine yesterday but had to stop due to profound tachycardia. Now on levophed alone at 14. Co-ox remains marginal. More alert. Able to tell me she is in hospital. CVP 10.  Follows commands and speaks with soft voice. CVVHD rate turned down. No urine output.   Objective:   Weight Range:  Vital Signs:   Temp:  [97.1 F (36.2 C)-99.6 F (37.6 C)] 99.6 F (37.6 C) (08/07 0838) Pulse Rate:  [84-125] 102 (08/07 1000) Resp:  [18-30] 22 (08/07  1000) BP: (83-138)/(39-111) 130/77 mmHg (08/07 1000) SpO2:  [96 %-100 %] 100 % (08/07 1000) Weight:  [83.915 kg (185 lb)] 83.915 kg (185 lb) (08/07 0500) Last BM Date: 02/20/14  Weight change: Filed Weights   02/18/14 0600 02/19/14 0500 02/20/14 0500  Weight: 90 kg (198 lb 6.6 oz) 89.7 kg (197 lb 12 oz) 83.915 kg (185 lb)    Intake/Output:   Intake/Output Summary (Last 24 hours) at 02/20/14 1035 Last data filed at 02/20/14 1000  Gross per 24 hour  Intake 2730.85 ml  Output   4791 ml  Net -2060.15 ml     Physical Exam: CVP 12 with prominent CV waves General:  Chronically ill appearing. No resp difficulty HEENT: normal; Panda intact; L IJ HD cath Neck: supple. JVP 10Carotids 2+ bilat; no bruits. No lymphadenopathy or thryomegaly appreciated. Cor: PMI nondisplaced. Regular rate & irregular rhythm. No rubs, gallops or murmurs. Lungs: Diminished in the bases Abdomen: soft, nontender, nondistended. No hepatosplenomegaly. No bruits or masses. Good bowel sounds. Extremities: no cyanosis, clubbing, rash, no edema; R 3L PICC Neuro: alert. Follows commands. Will whisper her name but o/w wont talk.  moves all 4 extremities w/o difficulty. Affect flat   Telemetry: Afib/flutter 90-120s  Labs: Basic Metabolic Panel:  Recent Labs Lab 02/16/14 0400  02/17/14 0400  02/18/14 0500 02/18/14 1712 02/19/14 0416 02/19/14 1713 02/20/14 0400  NA 139  < > 137  < > 136* 135* 136* 136* 137  K 3.9  < > 3.9  < >  4.2 4.1 4.2 4.3 4.5  CL 102  < > 100  < > 97 97 96 97 97  CO2 26  < > 25  < > 25 24 25 25 25   GLUCOSE 117*  < > 100*  < > 185* 195* 181* 197* 156*  BUN 26*  < > 26*  < > 25* 27* 28* 28* 28*  CREATININE 1.25*  < > 1.21*  < > 1.12* 1.10 1.19* 1.17* 1.22*  CALCIUM 8.1*  < > 8.2*  < > 8.8 8.8 9.1 8.9 9.3  MG 2.5  --  2.6*  --  2.6*  --  2.5  --  2.7*  PHOS  --   < > 2.0*  < > 2.4 2.7 2.5 2.8 2.6  < > = values in this interval not displayed.  Liver Function Tests:  Recent Labs Lab  02/15/14 0400  02/16/14 0400  02/17/14 0400  02/18/14 0500 02/18/14 1712 02/19/14 0416 02/19/14 1713 02/20/14 0400  AST 19  --  23  --  30  --  32  --  36  --   --   ALT 14  --  13  --  16  --  20  --  23  --   --   ALKPHOS 88  --  92  --  114  --  109  --  107  --   --   BILITOT 0.6  --  0.6  --  0.8  --  0.8  --  0.8  --   --   PROT 7.3  --  7.4  --  7.5  --  8.0  --  8.3  --   --   ALBUMIN 2.4*  < > 2.4*  < > 2.6*  < > 2.7* 2.7* 2.9* 3.0* 3.0*  < > = values in this interval not displayed. No results found for this basename: LIPASE, AMYLASE,  in the last 168 hours No results found for this basename: AMMONIA,  in the last 168 hours  CBC:  Recent Labs Lab 02/16/14 0400 02/17/14 0400 02/18/14 0500 02/19/14 0416 02/20/14 0400  WBC 10.2 10.7* 8.9 10.5 9.6  HGB 7.6* 8.4* 8.9* 9.1* 9.9*  HCT 25.2* 27.5* 29.2* 29.0* 31.9*  MCV 98.4 95.8 98.0 96.0 96.4  PLT 184 185 190 209 185    Cardiac Enzymes: No results found for this basename: CKTOTAL, CKMB, CKMBINDEX, TROPONINI,  in the last 168 hours  BNP: BNP (last 3 results) No results found for this basename: PROBNP,  in the last 8760 hours   Other results:  EKG: AF  Imaging: Dg Chest Portable 1 View  02/19/2014   CLINICAL DATA:  Pleural effusions.  Pulmonary edema.  EXAM: PORTABLE CHEST - 1 VIEW  COMPARISON:  02/18/2014 and 02/17/2014  FINDINGS: Pulmonary edema has resolved. There small bilateral pleural effusions, diminished. Pulmonary vascularity is now normal. Prosthetic mitral valve noted. CABG.  Feeding tube tip is below the diaphragm. PICC tip is at the cavoatrial junction.  IMPRESSION: Pulmonary edema resolved.  Decreasing pleural effusions.   Electronically Signed   By: Geanie Cooley M.D.   On: 02/19/2014 07:16     Medications:     Scheduled Medications: . aspirin  324 mg Per Tube Daily  . bisacodyl  10 mg Oral Daily   Or  . bisacodyl  10 mg Rectal Daily  . chlorhexidine  15 mL Mouth Rinse BID  . feeding  supplement (NEPRO CARB STEADY)  1,000 mL  Per Tube Q24H  . feeding supplement (PRO-STAT SUGAR FREE 64)  30 mL Per Tube QID  . insulin aspart  0-24 Units Subcutaneous 6 times per day  . insulin glargine  25 Units Subcutaneous BID  . levalbuterol  0.63 mg Nebulization Q6H  . levothyroxine  75 mcg Per Tube QAC breakfast  . pantoprazole sodium  40 mg Per Tube Q1200  . potassium phosphate IVPB (mmol)  10 mmol Intravenous Once  . sodium chloride  10-40 mL Intracatheter Q12H    Infusions: . sodium chloride Stopped (02/20/14 0700)  . sodium chloride Stopped (02/18/14 0700)  . amiodarone 30 mg/hr (02/20/14 1000)  . DOBUTamine Stopped (02/19/14 1337)  . heparin 1,500 Units/hr (02/20/14 1000)  . norepinephrine (LEVOPHED) Adult infusion 14 mcg/min (02/20/14 1000)  . dialysis replacement fluid (prismasate) 300 mL/hr at 02/20/14 0112  . dialysis replacement fluid (prismasate) 200 mL/hr at 02/19/14 2007  . dialysate (PRISMASATE) 2,000 mL/hr at 02/20/14 0922    PRN Medications: acetaminophen, heparin, heparin, ondansetron (ZOFRAN) IV, oxyCODONE, sodium chloride, sodium chloride   Assessment:   1) CAD - s/p CABG x 4 with MV replacement 2) A/C respiratory failure 3) AKI - On CVVHD 4) Cardiogenic shock 5) Afib/A Flutter 6) L Pleural effusion - s/p L CT 7) Anemia 8) Delerium, acute 9) ? 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 Shirlee Latch as well. She has had a large inferior MI with RV involvement. Although RV only looks moderately down on echo TAPSE is very low c/w severe RV failure and is likely the source of her ongoing cardiogenic shock.   Co-ox remains marginal (51%) despite increasing levophed. Milrinone didn't seem to daa much and failed dobutamine due to tachycardia. Will increase levophed to try to get co-ox 55% or greater - HR tolerating. May need to cardiovert her out of AFL to make this possible.   Main long-term issue remains  whether or not her kidneys will recover. I think likelihood is low but if there is going to be any chance of recovery we need to optimize co-ox and keep MAPs >=65 with pressors. I doubt she will be able to tolerate intermittent HD unless we improve her hemodynamics significantly. With RV failure would adjust CVVHD rate to keep CVP 8-12 range with RV failure. Would not go lower.   Continue amio and heparin for AFL.    The patient is critically ill with multiple organ systems failure and requires high complexity decision making for assessment and support, frequent evaluation and titration of therapies, application of advanced monitoring technologies and extensive interpretation of multiple databases.   Critical Care Time personally devoted to patient care services described in this note is 35 Minutes.  Karizma Cheek,MD 10:35 AM

## 2014-02-20 NOTE — Progress Notes (Signed)
Dr. [name] paged with most recent co-ox results. Ordered to give another 500 cc bolus of NS over an hour and then redraw another co-ox. Keep patient even on CVVHDF, instead of pulling fluid. He will call us to check later tonight. Will continue to monitor. [name]

## 2014-02-20 NOTE — Progress Notes (Signed)
Dr. [name] called regarding low co-ox. Will redraw per order. [name]

## 2014-02-20 NOTE — Progress Notes (Signed)
ANTICOAGULATION CONSULT NOTE - Follow Up Consult  Pharmacy Consult for Heparin  Indication: atrial fibrillation  No Known Allergies  Patient Measurements: Height: 5\' 7"  (170.2 cm) Weight: 185 lb (83.915 kg) IBW/kg (Calculated) : 61.6  Vital Signs: Temp: 98.8 F (37.1 C) (08/07 1154) Temp src: Axillary (08/07 1154) BP: 132/64 mmHg (08/07 1245) Pulse Rate: 82 (08/07 1422)  Labs:  Recent Labs  02/18/14 0500  02/19/14 0416 02/19/14 1318  02/19/14 1713 02/19/14 2300 02/20/14 0400 02/20/14 1300  HGB 8.9*  --  9.1*  --   --   --   --  9.9*  --   HCT 29.2*  --  29.0*  --   --   --   --  31.9*  --   PLT 190  --  209  --   --   --   --  185  --   APTT 139*  --   --  57*  --   --   --   --   --   HEPARINUNFRC  --   < >  --   --   < >  --  <0.10* <0.10* 0.22*  CREATININE 1.12*  < > 1.19*  --   --  1.17*  --  1.22*  --   < > = values in this interval not displayed.  Estimated Creatinine Clearance: 45 ml/min (by C-G formula based on Cr of 1.22).   Assessment: 73 y/o F on CRRT for acute renal failure psot CABG.  She is on heparin drip 1500 uts/hr and SUB-therapeutic heparin level 0.22. CBC stable, no bleeding noted. Other labs as above. No issues per RN.   Goal of Therapy:  Heparin level 0.3-0.7 units/ml Monitor platelets by anticoagulation protocol: Yes   Plan:  -Increase drip to 1700 units/hr  -Daily CBC/HL -Monitor for bleeding  Leota Sauers Pharm.D. CPP, BCPS Clinical Pharmacist [phone] 02/20/2014 3:23 PM

## 2014-02-20 NOTE — Progress Notes (Signed)
Patient ID: [name], female   DOB: 01/14/1941, 73 y.o.   MRN: [id number] TCTS DAILY ICU PROGRESS NOTE                   [address]            [place] [id number]          [phone]   26 Days Post-Op Procedure(s) (LRB): CORONARY ARTERY BYPASS GRAFTING TIMES FOUR USING LEFT INTERNAL MAMMARY ARTERY TO LAD, SAPHENOUS VEIN GRAFTS TO OM1, OM2, AND PDA (N/A) MITRAL VALVE (MV) REPLACEMENT (N/A)  Total Length of Stay:  LOS: 26 days   Subjective: Awake, attempts  to talk but not understandable  Objective: Vital signs in last 24 hours: Temp:  [97.1 F (36.2 C)-98.8 F (37.1 C)] 98.8 F (37.1 C) (08/07 0400) Pulse Rate:  [84-125] 109 (08/07 0721) Cardiac Rhythm:  [-] Atrial fibrillation (08/07 0600) Resp:  [18-30] 20 (08/07 0721) BP: (83-134)/(39-83) 123/76 mmHg (08/07 0700) SpO2:  [95 %-100 %] 99 % (08/07 0721) Weight:  [185 lb (83.915 kg)] 185 lb (83.915 kg) (08/07 0500)  Filed Weights   02/18/14 0600 02/19/14 0500 02/20/14 0500  Weight: 198 lb 6.6 oz (90 kg) 197 lb 12 oz (89.7 kg) 185 lb (83.915 kg)    Weight change: -12 lb 12 oz (-5.784 kg)   Hemodynamic parameters for last 24 hours: CVP:  [8 mmHg-12 mmHg] 9 mmHg  Intake/Output from previous day: 08/06 0701 - 08/07 0700 In: 2948.9 [I.V.:1245.6; NG/GT:1270; IV Piggyback:253.3] Out: 4862 [Stool:400]  Intake/Output this shift:    Current Meds: Scheduled Meds: . aspirin  324 mg Per Tube Daily  . bisacodyl  10 mg Oral Daily   Or  . bisacodyl  10 mg Rectal Daily  . chlorhexidine  15 mL Mouth Rinse BID  . feeding supplement (NEPRO CARB STEADY)  1,000 mL Per Tube Q24H  . feeding supplement (PRO-STAT SUGAR FREE 64)  30 mL Per Tube QID  . insulin aspart  0-24 Units Subcutaneous 6 times per day  . insulin glargine  25 Units Subcutaneous BID  . levalbuterol  0.63 mg Nebulization Q6H  . levothyroxine  75 mcg Per Tube QAC breakfast  . pantoprazole sodium  40 mg Per Tube Q1200  . sodium chloride  10-40  mL Intracatheter Q12H   Continuous Infusions: . sodium chloride 10 mL/hr at 02/19/14 1900  . sodium chloride Stopped (02/18/14 0700)  . amiodarone 30 mg/hr (02/20/14 0600)  . DOBUTamine Stopped (02/19/14 1337)  . heparin 1,500 Units/hr (02/20/14 0700)  . norepinephrine (LEVOPHED) Adult infusion 14 mcg/min (02/20/14 0700)  . dialysis replacement fluid (prismasate) 300 mL/hr at 02/20/14 0112  . dialysis replacement fluid (prismasate) 200 mL/hr at 02/19/14 2007  . dialysate (PRISMASATE) 2,000 mL/hr at 02/20/14 0552   PRN Meds:.acetaminophen, heparin, heparin, ondansetron (ZOFRAN) IV, oxyCODONE, sodium chloride, sodium chloride  General appearance: cooperative and no distress Neurologic: right sided weakness Heart: regular rate and rhythm, S1, S2 normal, no murmur, click, rub or gallop Lungs: diminished breath sounds bibasilar Abdomen: soft, non-tender; bowel sounds normal; no masses,  no organomegaly Extremities: extremities normal, atraumatic, no cyanosis or edema and Homans sign is negative, no sign of DVT Wound: sternum stable  Lab Results: CBC: Recent Labs  02/19/14 0416 02/20/14 0400  WBC 10.5 9.6  HGB 9.1* 9.9*  HCT 29.0* 31.9*  PLT 209 185   BMET:  Recent Labs  02/19/14 1713 02/20/14 0400  NA 136* 137  K 4.3 4.5  CL 97 97  CO2 25 25  GLUCOSE 197* 156*  BUN 28* 28*  CREATININE 1.17* 1.22*  CALCIUM 8.9 9.3    PT/INR: No results found for this basename: LABPROT, INR,  in the last 72 hours Radiology: Dg Chest Portable 1 View  02/19/2014   CLINICAL DATA:  Pleural effusions.  Pulmonary edema.  EXAM: PORTABLE CHEST - 1 VIEW  COMPARISON:  02/18/2014 and 02/17/2014  FINDINGS: Pulmonary edema has resolved. There small bilateral pleural effusions, diminished. Pulmonary vascularity is now normal. Prosthetic mitral valve noted. CABG.  Feeding tube tip is below the diaphragm. PICC tip is at the cavoatrial junction.  IMPRESSION: Pulmonary edema resolved.  Decreasing pleural  effusions.   Electronically Signed   By: Geanie Cooley M.D.   On: 02/19/2014 07:16     Assessment/Plan: S/P Procedure(s) (LRB): CORONARY ARTERY BYPASS GRAFTING TIMES FOUR USING LEFT INTERNAL MAMMARY ARTERY TO LAD, SAPHENOUS VEIN GRAFTS TO OM1, OM2, AND PDA (N/A) MITRAL VALVE (MV) REPLACEMENT (N/A) Did not tolerate dopamine Mild right upper arm weakness compared to left Too weak to take  Po diet Heart failure team following patient plts holding ok on heparin  Jalah Warmuth B 02/20/2014 7:32 AM

## 2014-02-20 NOTE — Progress Notes (Signed)
Spoke with Dr. [name] regarding follow up coox of 31.1 after second 500cc NS bolus. Update on current HR. Will restart Milrinone at 0.53mcg/kg/min and recheck coox an hour after Milrinone drip start. Dr. [name] will call back later for an update. Thresa Ross RN

## 2014-02-20 NOTE — Progress Notes (Addendum)
Dr. [name] paged about decreased co-ox. 500 cc bolus of ns order. Will redraw co-ox after bolus. No new changes or complaints from patient. Will continue to monitor. [name]

## 2014-02-20 NOTE — Progress Notes (Signed)
PULMONARY / CRITICAL CARE MEDICINE  Name: [name] MRN: [id number] DOB: Oct 23, 1940    ADMISSION DATE:  01/25/2014 CONSULTATION DATE:  02/20/2014  REFERRING MD :  Tyrone SageGerhardt  CHIEF COMPLAINT:  Hypoxemia  INITIAL PRESENTATION: 73 yo admitted 7/12 with STEMI. Taken to cath lab but unable to perform PCI due to significant RCA disease. Taken to OR emergently for CABG x 4.  Returned to SICU mechanically ventilated.  Course c/b failed extubation, pleural effusion, renal failure on CRRT,    STUDIES / EVENTS:  7/12 Left heart cath: occlusion of RCA not amendable to PCI 7/13 CABG x 4 + MVR [name] 7/20 TTE:  EF 55%, no RWMA, grade 3 DD, PAP 41 torr 7/24 R thoracentesis 1,200 mL 7/25 Extubated 7/27 Left hemithorax opacification. Reintubated 7/27 FOB >>> no overt airway obstruction noted 7/27 CT chest >>> large L effusion, moderate R effusion 7/27 CT guided pleural catheter placed by IR. 1300 cc of serosanguinous fluid  7/28 Renal consult >>> CRRT initiated 7/31: extubated   02/19/14: More alert.  Weaning levophed.  Remains on milrinone, amio.  Co-ox 49%,. Very deconditioned and has hypoactive delirium    SUBJECTIVE/OVERNIGHT/INTERVAL HX 02/20/14: deconditioning and hypoactive delirium continue. Off milrinone. Did not tolerate dobutamine. On levophed. On CVVH. No resp issues  VITAL SIGNS: Temp:  [97.1 F (36.2 C)-98.8 F (37.1 C)] 98.8 F (37.1 C) (08/07 0400) Pulse Rate:  [84-125] 109 (08/07 0721) Resp:  [18-30] 20 (08/07 0721) BP: (83-134)/(39-83) 123/76 mmHg (08/07 0700) SpO2:  [95 %-100 %] 99 % (08/07 0721) Weight:  [83.915 kg (185 lb)] 83.915 kg (185 lb) (08/07 0500)  HEMODYNAMICS: CVP:  [8 mmHg-11 mmHg] 8 mmHg     INTAKE / OUTPUT: Intake/Output     08/06 0701 - 08/07 0700 08/07 0701 - 08/08 0700   I.V. (mL/kg) 1245.6 (14.8)    Other 180    NG/GT 1270    IV Piggyback 253.3    Total Intake(mL/kg) 2948.9 (35.1)    Urine (mL/kg/hr)     Other 4462 (2.2)    Stool 400  (0.2)    Total Output 4862     Net -1913.1           PHYSICAL EXAMINATION: General: comfortable,NAD Neuro: mcuh more awake, follows commands, MAE, gen weakness  HEENT:  Panda tube, mm dry, poor oral hygiene  Cardiovascular: Irreg irreg, no mgr Lungs: resps even non labored on [place], protecting airway + Abdomen: BS+, soft nontender Ext: Improved edema   LABS: CBC  Recent Labs Lab 02/18/14 0500 02/19/14 0416 02/20/14 0400  WBC 8.9 10.5 9.6  HGB 8.9* 9.1* 9.9*  HCT 29.2* 29.0* 31.9*  PLT 190 209 185   Coag's  Recent Labs Lab 02/17/14 0400 02/18/14 0500 02/19/14 1318  APTT 113* 139* 57*   BMET  Recent Labs Lab 02/19/14 0416 02/19/14 1713 02/20/14 0400  NA 136* 136* 137  K 4.2 4.3 4.5  CL 96 97 97  CO2 25 25 25   BUN 28* 28* 28*  CREATININE 1.19* 1.17* 1.22*  GLUCOSE 181* 197* 156*   Electrolytes  Recent Labs Lab 02/18/14 0500  02/19/14 0416 02/19/14 1713 02/20/14 0400  CALCIUM 8.8  < > 9.1 8.9 9.3  MG 2.6*  --  2.5  --  2.7*  PHOS 2.4  < > 2.5 2.8 2.6  < > = values in this interval not displayed.  Sepsis Markers No results found for this basename: LATICACIDVEN, PROCALCITON, O2SATVEN,  in the last 168 hours  ABG  Recent Labs Lab 02/13/14 1021 02/14/14 1649 02/17/14 1013  PHART 7.462* 7.708* 7.463*  PCO2ART 38.1 19.6* 37.2  PO2ART 90.0 25.0* 78.0*   Liver Enzymes  Recent Labs Lab 02/17/14 0400  02/18/14 0500  02/19/14 0416 02/19/14 1713 02/20/14 0400  AST 30  --  32  --  36  --   --   ALT 16  --  20  --  23  --   --   ALKPHOS 114  --  109  --  107  --   --   BILITOT 0.8  --  0.8  --  0.8  --   --   ALBUMIN 2.6*  < > 2.7*  < > 2.9* 3.0* 3.0*  < > = values in this interval not displayed. Cardiac Enzymes No results found for this basename: TROPONINI, PROBNP,  in the last 168 hours  Glucose  Recent Labs Lab 02/19/14 0751 02/19/14 1158 02/19/14 1648 02/19/14 1929 02/19/14 2327 02/20/14 0330  GLUCAP 180* 190* 168* 174* 194*  137*   IMAGING: Dg Chest Portable 1 View  02/19/2014   CLINICAL DATA:  Pleural effusions.  Pulmonary edema.  EXAM: PORTABLE CHEST - 1 VIEW  COMPARISON:  02/18/2014 and 02/17/2014  FINDINGS: Pulmonary edema has resolved. There small bilateral pleural effusions, diminished. Pulmonary vascularity is now normal. Prosthetic mitral valve noted. CABG.  Feeding tube tip is below the diaphragm. PICC tip is at the cavoatrial junction.  IMPRESSION: Pulmonary edema resolved.  Decreasing pleural effusions.   Electronically Signed   By: Geanie Cooley M.D.   On: 02/19/2014 07:16     ASSESSMENT / PLAN:  PULMONARY  A:  Acute hypoxemic respiratory failure, extubated 7/31 HCAP Large L pleural effusion - s/p pleural cath drainage 7/27 Chest tubes removed 8/4   - nil acute 02/20/14 P:  Goal SpO2>92 Supplemental oxygen Flutter valve / incentive spirometry as able Mobilize as able - goal up in chair even if lift required, PT ordered Continue fluid removal   CARDIOVASCULAR  A:  Afib with RVR CAD - s/p CABG, MVR  Acute on chronic diastolic heart failure   P:  Per cards and cvts   RENAL  A:  AKI Volume overload P:  Renal following Cont CRRT per renal  GASTROINTESTINAL  A:  Protein calorie malnutrition  P:  Cont TF  Consider swallow eval - timing based on cvts PPI  HEMATOLOGIC  A:  Anemia with out active bleeding  P:  F/u cbc  Heparin gtt per CVTS PRBC for hgb < 8gm%    INFECTIOUS  A:  HCAP P:  S/p course abx  Monitor wbc, fever curve off abx   ENDOCRINE  A:  hypothyroid DM P:  SSI, lantus Synthroid    NEUROLOGIC  A:  Derlirium -  chronic?, baseline dementia?   - ongoing + but RN reports improvement 02/19/14 (Staff MD note) P:  Avoid sedating meds Lights on in daytime, off at night Frequent oritentation Consider swallow eval with improved mentation  Mobilize     PCCM will sign off.      Pulmonary and Critical Care Medicine Staff Physician [place]  [facility] [place] [facility] Pager: [phone], If no answer or between  15:00h - 7:00h: call [phone]  02/20/2014 8:34 AM

## 2014-02-21 ENCOUNTER — Inpatient Hospital Stay (HOSPITAL_COMMUNITY): Payer: Medicare Other

## 2014-02-21 LAB — GLUCOSE, CAPILLARY
Glucose-Capillary: 173 mg/dL — ABNORMAL HIGH (ref 70–99)
Glucose-Capillary: 129 mg/dL — ABNORMAL HIGH (ref 70–99)
Glucose-Capillary: 166 mg/dL — ABNORMAL HIGH (ref 70–99)
Glucose-Capillary: 230 mg/dL — ABNORMAL HIGH (ref 70–99)
Glucose-Capillary: 111 mg/dL — ABNORMAL HIGH (ref 70–99)
Glucose-Capillary: 234 mg/dL — ABNORMAL HIGH (ref 70–99)

## 2014-02-21 LAB — RENAL FUNCTION PANEL
ALBUMIN: 2.7 g/dL — AB (ref 3.5–5.2)
Albumin: 2.9 g/dL — ABNORMAL LOW (ref 3.5–5.2)
Anion gap: 16 — ABNORMAL HIGH (ref 5–15)
Anion gap: 13 (ref 5–15)
BUN: 30 mg/dL — AB (ref 6–23)
BUN: 27 mg/dL — ABNORMAL HIGH (ref 6–23)
CALCIUM: 8.7 mg/dL (ref 8.4–10.5)
CALCIUM: 8.5 mg/dL (ref 8.4–10.5)
CHLORIDE: 95 meq/L — AB (ref 96–112)
CO2: 23 meq/L (ref 19–32)
CO2: 24 mEq/L (ref 19–32)
CREATININE: 1.08 mg/dL (ref 0.50–1.10)
Chloride: 99 mEq/L (ref 96–112)
Creatinine, Ser: 1.07 mg/dL (ref 0.50–1.10)
GFR calc Af Amer: 57 mL/min — ABNORMAL LOW (ref >90)
GFR calc Af Amer: 58 mL/min — ABNORMAL LOW (ref >90)
GFR calc non Af Amer: 49 mL/min — ABNORMAL LOW (ref >90)
GFR, EST NON AFRICAN AMERICAN: 50 mL/min — AB (ref >90)
GLUCOSE: 243 mg/dL — AB (ref 70–99)
Glucose, Bld: 111 mg/dL — ABNORMAL HIGH (ref 70–99)
Phosphorus: 2.8 mg/dL (ref 2.3–4.6)
Phosphorus: 2 mg/dL — ABNORMAL LOW (ref 2.3–4.6)
Potassium: 4.3 mEq/L (ref 3.7–5.3)
Potassium: 3.7 mEq/L (ref 3.7–5.3)
Sodium: 134 mEq/L — ABNORMAL LOW (ref 137–147)
Sodium: 136 mEq/L — ABNORMAL LOW (ref 137–147)

## 2014-02-21 LAB — CBC
HCT: 33.5 % — ABNORMAL LOW (ref 36.0–46.0)
Hemoglobin: 10.1 g/dL — ABNORMAL LOW (ref 12.0–15.0)
MCH: 29.9 pg (ref 26.0–34.0)
MCHC: 30.1 g/dL (ref 30.0–36.0)
MCV: 99.1 fL (ref 78.0–100.0)
PLATELETS: 129 10*3/uL — AB (ref 150–400)
RBC: 3.38 MIL/uL — AB (ref 3.87–5.11)
RDW: 19.3 % — ABNORMAL HIGH (ref 11.5–15.5)
WBC: 11.9 10*3/uL — ABNORMAL HIGH (ref 4.0–10.5)

## 2014-02-21 LAB — CARBOXYHEMOGLOBIN
Carboxyhemoglobin: 1.6 % — ABNORMAL HIGH (ref 0.5–1.5)
Methemoglobin: 0.8 % (ref 0.0–1.5)
O2 Saturation: 42.1 %
Total hemoglobin: 10.2 g/dL — ABNORMAL LOW (ref 12.0–16.0)

## 2014-02-21 LAB — HEPARIN LEVEL (UNFRACTIONATED)
Heparin Unfractionated: 0.7 IU/mL (ref 0.30–0.70)
Heparin Unfractionated: 0.56 IU/mL (ref 0.30–0.70)

## 2014-02-21 LAB — MAGNESIUM: MAGNESIUM: 2.5 mg/dL (ref 1.5–2.5)

## 2014-02-21 MED ORDER — SODIUM CHLORIDE 0.9 % IV BOLUS (SEPSIS): 500.0000 mL | Freq: Once | INTRAVENOUS | Status: AC

## 2014-02-21 NOTE — Progress Notes (Signed)
ANTICOAGULATION CONSULT NOTE - Follow Up Consult  Pharmacy Consult for Heparin  Indication: atrial fibrillation  No Known Allergies  Patient Measurements: Height: 5\' 7"  (170.2 cm) Weight: 182 lb 12.2 oz (82.9 kg) IBW/kg (Calculated) : 61.6  Vital Signs: Temp: 98 F (36.7 C) (08/08 1543) Temp src: Axillary (08/08 1543) BP: 107/51 mmHg (08/08 1500) Pulse Rate: 122 (08/08 1500)  Labs:  Recent Labs  02/19/14 0416 02/19/14 1318  02/20/14 0400 02/20/14 1300 02/20/14 1610 02/21/14 0327 02/21/14 1504  HGB 9.1*  --   --  9.9*  --   --  10.1*  --   HCT 29.0*  --   --  31.9*  --   --  33.5*  --   PLT 209  --   --  185  --   --  129*  --   APTT  --  57*  --   --   --   --   --   --   HEPARINUNFRC  --   --   < > <0.10* 0.22*  --  0.70 0.56  CREATININE 1.19*  --   < > 1.22*  --  1.20* 1.08  --   < > = values in this interval not displayed.  Estimated Creatinine Clearance: 50.6 ml/min (by C-G formula based on Cr of 1.08).   Assessment: 73 y/o F on CRRT for acute renal failure post CABG.  She is on a heparin drip for prosthetic MVR and afib at 1600 units / hr  PM HL is therapeutic  Goal of Therapy:  Heparin level 0.3-0.7 units/ml Monitor platelets by anticoagulation protocol: Yes   Plan:  Continue heparin at 1600 units / hr Follow up AM labs  Thank you. Okey Regal, PharmD [phone]  02/21/2014 4:19 PM

## 2014-02-21 NOTE — Progress Notes (Signed)
ANTICOAGULATION CONSULT NOTE - Follow Up Consult  Pharmacy Consult for Heparin  Indication: atrial fibrillation  No Known Allergies  Patient Measurements: Height: 5\' 7"  (170.2 cm) Weight: 182 lb 12.2 oz (82.9 kg) IBW/kg (Calculated) : 61.6  Vital Signs: Temp: 98.1 F (36.7 C) (08/08 0400) Temp src: Oral (08/08 0400) BP: 104/67 mmHg (08/08 0700) Pulse Rate: 100 (08/08 0700)  Labs:  Recent Labs  02/19/14 0416 02/19/14 1318  02/20/14 0400 02/20/14 1300 02/20/14 1610 02/21/14 0327  HGB 9.1*  --   --  9.9*  --   --  10.1*  HCT 29.0*  --   --  31.9*  --   --  33.5*  PLT 209  --   --  185  --   --  129*  APTT  --  57*  --   --   --   --   --   HEPARINUNFRC  --   --   < > <0.10* 0.22*  --  0.70  CREATININE 1.19*  --   < > 1.22*  --  1.20* 1.08  < > = values in this interval not displayed.  Estimated Creatinine Clearance: 50.6 ml/min (by C-G formula based on Cr of 1.08).   Assessment: 73 y/o F on CRRT for acute renal failure post CABG.  She is on a heparin drip for prosthetic MVR and afib at 1700 uts/hr and therapeutic heparin level 0.70 but at upper end of goal. CBC stable, no bleeding noted. Other labs as above. No issues per RN.   Goal of Therapy:  Heparin level 0.3-0.7 units/ml Monitor platelets by anticoagulation protocol: Yes   Plan:  - Decrease heparin to 1600 u/hr to maintain in goal - Confirmatory HL in 8 hours, then daily CBC/HL - Monitor for bleeding  Margie Billet, PharmD Clinical Pharmacist - Resident Pager: [phone] Pharmacy: [phone] 02/21/2014 7:15 AM

## 2014-02-21 NOTE — Progress Notes (Signed)
CVVHD Management.  Metabolically stable.  Keeping even.  Will change order to keep even for CVP < 12.  Now back on milrinone.

## 2014-02-21 NOTE — Progress Notes (Signed)
Patient ID: Clare GandyLouise S Troiano, female   DOB: 01-07-1941, 73 y.o.   MRN: [id number] [facility] DAILY ICU PROGRESS NOTE                   [address]            [place] [id number]          [phone]   27 Days Post-Op Procedure(s) (LRB): CORONARY ARTERY BYPASS GRAFTING TIMES FOUR USING LEFT INTERNAL MAMMARY ARTERY TO LAD, SAPHENOUS VEIN GRAFTS TO OM1, OM2, AND PDA (N/A) MITRAL VALVE (MV) REPLACEMENT (N/A)  Total Length of Stay:  LOS: 27 days   Subjective: Awake, very weak, still afib  Objective: Vital signs in last 24 hours: Temp:  [97.7 F (36.5 C)-98.8 F (37.1 C)] 98.4 F (36.9 C) (08/08 0813) Pulse Rate:  [81-113] 104 (08/08 0900) Cardiac Rhythm:  [-] Atrial flutter (08/08 0800) Resp:  [15-33] 24 (08/08 0900) BP: (94-145)/(45-96) 104/63 mmHg (08/08 0900) SpO2:  [97 %-100 %] 100 % (08/08 0900) Weight:  [182 lb 12.2 oz (82.9 kg)] 182 lb 12.2 oz (82.9 kg) (08/08 0500)  Filed Weights   02/19/14 0500 02/20/14 0500 02/21/14 0500  Weight: 197 lb 12 oz (89.7 kg) 185 lb (83.915 kg) 182 lb 12.2 oz (82.9 kg)    Weight change: -2 lb 3.8 oz (-1.015 kg)   Hemodynamic parameters for last 24 hours: CVP:  [0 mmHg-10 mmHg] 0 mmHg  Intake/Output from previous day: 08/07 0701 - 08/08 0700 In: 3849.7 [I.V.:1776.4; NG/GT:1260; IV Piggyback:753.3] Out: 3675 [Stool:450]  Intake/Output this shift: Total I/O In: 81.8 [I.V.:41.8; NG/GT:40] Out: 190 [Other:190]  Current Meds: Scheduled Meds: . aspirin  324 mg Per Tube Daily  . bisacodyl  10 mg Oral Daily   Or  . bisacodyl  10 mg Rectal Daily  . chlorhexidine  15 mL Mouth Rinse BID  . feeding supplement (NEPRO CARB STEADY)  1,000 mL Per Tube Q24H  . feeding supplement (PRO-STAT SUGAR FREE 64)  30 mL Per Tube QID  . insulin aspart  0-24 Units Subcutaneous 6 times per day  . insulin glargine  25 Units Subcutaneous BID  . levalbuterol  0.63 mg Nebulization Q6H  . levothyroxine  75 mcg Per Tube QAC breakfast  . pantoprazole  sodium  40 mg Per Tube Q1200  . sodium chloride  500 mL Intravenous Once  . sodium chloride  10-40 mL Intracatheter Q12H   Continuous Infusions: . sodium chloride Stopped (02/20/14 0700)  . sodium chloride Stopped (02/20/14 1830)  . amiodarone 30 mg/hr (02/21/14 0800)  . DOBUTamine Stopped (02/19/14 1337)  . heparin 1,600 Units/hr (02/21/14 0800)  . milrinone 0.25 mcg/kg/min (02/21/14 0918)  . norepinephrine (LEVOPHED) Adult infusion 20.053 mcg/min (02/21/14 0800)  . dialysis replacement fluid (prismasate) 300 mL/hr at 02/20/14 1903  . dialysis replacement fluid (prismasate) 200 mL/hr at 02/20/14 2248  . dialysate (PRISMASATE) 2,000 mL/hr at 02/21/14 0840   PRN Meds:.acetaminophen, heparin, heparin, ondansetron (ZOFRAN) IV, oxyCODONE, sodium chloride, sodium chloride  General appearance: cooperative and no distress Neurologic: right sided weakness Heart: regular rate and rhythm, S1, S2 normal, no murmur, click, rub or gallop Lungs: diminished breath sounds bibasilar Abdomen: soft, non-tender; bowel sounds normal; no masses,  no organomegaly Extremities: extremities normal, atraumatic, no cyanosis or edema and Homans sign is negative, no sign of DVT Wound: sternum stable  Lab Results: CBC:  Recent Labs  02/20/14 0400 02/21/14 0327  WBC 9.6 11.9*  HGB 9.9* 10.1*  HCT 31.9* 33.5*  PLT 185 129*   BMET:   Recent Labs  02/20/14 1610 02/21/14 0327  NA 135* 134*  K 4.6 4.3  CL 95* 95*  CO2 24 23  GLUCOSE 207* 243*  BUN 30* 30*  CREATININE 1.20* 1.08  CALCIUM 9.2 8.7    PT/INR: No results found for this basename: LABPROT, INR,  in the last 72 hours Radiology: Dg Chest Port 1 View  02/21/2014   CLINICAL DATA:  Status post cardiac surgery.  Check chest tube.  EXAM: PORTABLE CHEST - 1 VIEW  COMPARISON:  One-view chest 02/19/2014.  FINDINGS: A small bore feeding tube courses off the inferior border of the film. A left IJ catheter is stable in position. No chest tube is in  place. A right-sided PICC line is stable in position.  The heart size is normal. The patient is status post mitral valve replacement. Mild edema is improved. A small left pleural effusion and associated atelectasis is again noted.  IMPRESSION: 1. Stable support apparatus. 2. Improving aeration. 3. Small left pleural effusion and associated atelectasis.   Electronically Signed   By: [name] M.D.   On: 02/21/2014 08:50     Assessment/Plan: S/P Procedure(s) (LRB): CORONARY ARTERY BYPASS GRAFTING TIMES FOUR USING LEFT INTERNAL MAMMARY ARTERY TO LAD, SAPHENOUS VEIN GRAFTS TO OM1, OM2, AND PDA (N/A) MITRAL VALVE (MV) REPLACEMENT (N/A) Did not tolerate dopamine Too weak to take  Po diet Heart failure team following patient plts holding ok on heparin Continues afib  Decrease cvvh fluid off  Aydeen Blume B 02/21/2014 9:45 AM

## 2014-02-21 NOTE — Progress Notes (Signed)
HR increased to 115-124-Atrial Flutter with rare PVCs.  CVP 2-3.  Patient denies pain/SOB.  No other change in assessment.  Sherron Ales notified of all parameters.  Orders rec'd.

## 2014-02-21 NOTE — Progress Notes (Signed)
Patient ID: [name], female   DOB: March 24, 1941, 73 y.o.   MRN: [id number] EVENING ROUNDS NOTE :     [address]       [place] [address]             [phone]                 27 Days Post-Op Procedure(s) (LRB): CORONARY ARTERY BYPASS GRAFTING TIMES FOUR USING LEFT INTERNAL MAMMARY ARTERY TO LAD, SAPHENOUS VEIN GRAFTS TO OM1, OM2, AND PDA (N/A) MITRAL VALVE (MV) REPLACEMENT (N/A)  Total Length of Stay:  LOS: 27 days  BP 103/50  Pulse 98  Temp(Src) 98 F (36.7 C) (Axillary)  Resp 21  Ht 5\' 7"  (1.702 m)  Wt 182 lb 12.2 oz (82.9 kg)  BMI 28.62 kg/m2  SpO2 100%  .Intake/Output     08/08 0701 - 08/09 0700   I.V. (mL/kg) 1194.6 (14.4)   Other    NG/GT 480   IV Piggyback    Total Intake(mL/kg) 1674.6 (20.2)   Other 1133   Stool    Total Output 1133   Net +541.6         . sodium chloride Stopped (02/20/14 0700)  . sodium chloride Stopped (02/20/14 1830)  . amiodarone 30 mg/hr (02/21/14 2000)  . DOBUTamine Stopped (02/19/14 1337)  . heparin 1,600 Units/hr (02/21/14 2000)  . milrinone 0.25 mcg/kg/min (02/21/14 2000)  . norepinephrine (LEVOPHED) Adult infusion 20.053 mcg/min (02/21/14 2000)  . dialysis replacement fluid (prismasate) 300 mL/hr at 02/21/14 1117  . dialysis replacement fluid (prismasate) 200 mL/hr at 02/20/14 2248  . dialysate (PRISMASATE) 2,000 mL/hr at 02/21/14 2050     Lab Results  Component Value Date   WBC 11.9* 02/21/2014   HGB 10.1* 02/21/2014   HCT 33.5* 02/21/2014   PLT 129* 02/21/2014   GLUCOSE 111* 02/21/2014   CHOL 55 02/06/2014   TRIG 115 01/26/2014   HDL 20* 01/26/2014   LDLCALC 39 01/26/2014   ALT 23 02/19/2014   AST 36 02/19/2014   NA 136* 02/21/2014   K 3.7 02/21/2014   CL 99 02/21/2014   CREATININE 1.07 02/21/2014   BUN 27* 02/21/2014   CO2 24 02/21/2014   TSH 6.630* 02/11/2014   INR 1.73* 01/26/2014   HGBA1C 8.4* 01/26/2014   Mild  Elevation of TSH More alert and talkative   Delight Ovens MD  Beeper [phone] Office  [phone] 02/21/2014 8:55 PM

## 2014-02-21 NOTE — Progress Notes (Signed)
SUBJECTIVE:  CoOx 31.1 last night.  She was given 500 cc NS x 2.  Milrinone restarted.  CVVHD adjusted to keep I/O even and CVP in 8 - 12 range.   However, CVP this AM is 4 with MAP of 63.  CoOx is up slightly to 42.1  She is not in distress.  She wakes up and responds   PHYSICAL EXAM Filed Vitals:   02/21/14 0700 02/21/14 0723 02/21/14 0800 02/21/14 0813  BP: 104/67  99/53   Pulse: 100 103 99   Temp:    98.4 F (36.9 C)  TempSrc:    Axillary  Resp: 17 21 20    Height:      Weight:      SpO2: 99% 100% 98%    General:  Chronically ill appearing Lungs:  Decreased breath sounds Heart:  Irregular Abdomen:  Decreased breath sounds Extremities:  No edema, severe muscle wasting.   LABS: Lab Results  Component Value Date   TROPONINI 1.86* 01/25/2014   Results for orders placed during the hospital encounter of 01/25/14 (from the past 24 hour(s))  GLUCOSE, CAPILLARY     Status: Abnormal   Collection Time    02/20/14 11:52 AM      Result Value Ref Range   Glucose-Capillary 175 (*) 70 - 99 mg/dL   Comment 1 Documented in Chart     Comment 2 Notify RN    HEPARIN LEVEL (UNFRACTIONATED)     Status: Abnormal   Collection Time    02/20/14  1:00 PM      Result Value Ref Range   Heparin Unfractionated 0.22 (*) 0.30 - 0.70 IU/mL  CARBOXYHEMOGLOBIN     Status: Abnormal   Collection Time    02/20/14  1:00 PM      Result Value Ref Range   Total hemoglobin 10.8 (*) 12.0 - 16.0 g/dL   O2 Saturation 16.141.6     Carboxyhemoglobin 1.5  0.5 - 1.5 %   Methemoglobin 1.0  0.0 - 1.5 %  GLUCOSE, CAPILLARY     Status: Abnormal   Collection Time    02/20/14  3:56 PM      Result Value Ref Range   Glucose-Capillary 175 (*) 70 - 99 mg/dL  RENAL FUNCTION PANEL     Status: Abnormal   Collection Time    02/20/14  4:10 PM      Result Value Ref Range   Sodium 135 (*) 137 - 147 mEq/L   Potassium 4.6  3.7 - 5.3 mEq/L   Chloride 95 (*) 96 - 112 mEq/L   CO2 24  19 - 32 mEq/L   Glucose, Bld 207 (*) 70  - 99 mg/dL   BUN 30 (*) 6 - 23 mg/dL   Creatinine, Ser 0.961.20 (*) 0.50 - 1.10 mg/dL   Calcium 9.2  8.4 - 04.510.5 mg/dL   Phosphorus 3.1  2.3 - 4.6 mg/dL   Albumin 3.2 (*) 3.5 - 5.2 g/dL   GFR calc non Af Amer 43 (*) >90 mL/min   GFR calc Af Amer 50 (*) >90 mL/min   Anion gap 16 (*) 5 - 15  CARBOXYHEMOGLOBIN     Status: Abnormal   Collection Time    02/20/14  4:56 PM      Result Value Ref Range   Total hemoglobin 10.8 (*) 12.0 - 16.0 g/dL   O2 Saturation 40.936.1     Carboxyhemoglobin 1.5  0.5 - 1.5 %   Methemoglobin 0.8  0.0 - 1.5 %  CARBOXYHEMOGLOBIN     Status: Abnormal   Collection Time    02/20/14  6:51 PM      Result Value Ref Range   Total hemoglobin 10.6 (*) 12.0 - 16.0 g/dL   O2 Saturation 16.1     Carboxyhemoglobin 1.5  0.5 - 1.5 %   Methemoglobin 0.8  0.0 - 1.5 %  GLUCOSE, CAPILLARY     Status: Abnormal   Collection Time    02/20/14  7:58 PM      Result Value Ref Range   Glucose-Capillary 202 (*) 70 - 99 mg/dL   Comment 1 Documented in Chart     Comment 2 Notify RN    CARBOXYHEMOGLOBIN     Status: None   Collection Time    02/20/14  9:00 PM      Result Value Ref Range   Total hemoglobin 12.1  12.0 - 16.0 g/dL   O2 Saturation 09.6     Carboxyhemoglobin 1.3  0.5 - 1.5 %   Methemoglobin 0.7  0.0 - 1.5 %  CARBOXYHEMOGLOBIN     Status: Abnormal   Collection Time    02/20/14 11:20 PM      Result Value Ref Range   Total hemoglobin 10.0 (*) 12.0 - 16.0 g/dL   O2 Saturation 04.5     Carboxyhemoglobin 1.5  0.5 - 1.5 %   Methemoglobin 0.7  0.0 - 1.5 %  GLUCOSE, CAPILLARY     Status: Abnormal   Collection Time    02/21/14 12:04 AM      Result Value Ref Range   Glucose-Capillary 173 (*) 70 - 99 mg/dL  RENAL FUNCTION PANEL     Status: Abnormal   Collection Time    02/21/14  3:27 AM      Result Value Ref Range   Sodium 134 (*) 137 - 147 mEq/L   Potassium 4.3  3.7 - 5.3 mEq/L   Chloride 95 (*) 96 - 112 mEq/L   CO2 23  19 - 32 mEq/L   Glucose, Bld 243 (*) 70 - 99 mg/dL    BUN 30 (*) 6 - 23 mg/dL   Creatinine, Ser 4.09  0.50 - 1.10 mg/dL   Calcium 8.7  8.4 - 81.1 mg/dL   Phosphorus 2.8  2.3 - 4.6 mg/dL   Albumin 2.9 (*) 3.5 - 5.2 g/dL   GFR calc non Af Amer 49 (*) >90 mL/min   GFR calc Af Amer 57 (*) >90 mL/min   Anion gap 16 (*) 5 - 15  HEPARIN LEVEL (UNFRACTIONATED)     Status: None   Collection Time    02/21/14  3:27 AM      Result Value Ref Range   Heparin Unfractionated 0.70  0.30 - 0.70 IU/mL  CBC     Status: Abnormal   Collection Time    02/21/14  3:27 AM      Result Value Ref Range   WBC 11.9 (*) 4.0 - 10.5 K/uL   RBC 3.38 (*) 3.87 - 5.11 MIL/uL   Hemoglobin 10.1 (*) 12.0 - 15.0 g/dL   HCT 91.4 (*) 78.2 - 95.6 %   MCV 99.1  78.0 - 100.0 fL   MCH 29.9  26.0 - 34.0 pg   MCHC 30.1  30.0 - 36.0 g/dL   RDW 21.3 (*) 08.6 - 57.8 %   Platelets 129 (*) 150 - 400 K/uL  MAGNESIUM     Status: None   Collection Time    02/21/14  3:27 AM  Result Value Ref Range   Magnesium 2.5  1.5 - 2.5 mg/dL  GLUCOSE, CAPILLARY     Status: Abnormal   Collection Time    02/21/14  4:12 AM      Result Value Ref Range   Glucose-Capillary 129 (*) 70 - 99 mg/dL  CARBOXYHEMOGLOBIN     Status: Abnormal   Collection Time    02/21/14  5:37 AM      Result Value Ref Range   Total hemoglobin 10.2 (*) 12.0 - 16.0 g/dL   O2 Saturation 08.6     Carboxyhemoglobin 1.6 (*) 0.5 - 1.5 %   Methemoglobin 0.8  0.0 - 1.5 %  GLUCOSE, CAPILLARY     Status: Abnormal   Collection Time    02/21/14  8:07 AM      Result Value Ref Range   Glucose-Capillary 166 (*) 70 - 99 mg/dL   Comment 1 Notify RN      Intake/Output Summary (Last 24 hours) at 02/21/14 0919 Last data filed at 02/21/14 0800  Gross per 24 hour  Intake 3518.6 ml  Output   3560 ml  Net  -41.4 ml    CXR:  1. Stable support apparatus.  2. Improving aeration.  3. Small left pleural effusion and associated atelectasis.   ASSESSMENT AND PLAN:  CAD/CABG:   CABG x 4 with MVR  CARDIOGENIC SHOCK:  RV failure.   Not able to tolerate dobutamine and no improvement with milrinone initially.  However milrinone was restarted.   Today nephrology has written that she will have volume removed to keep her I/O even if her CVP is greater than 12.    ATRIAL FIB:   We can consider DCCV.  However, she has not been on three weeks of uninterrupted anticoagulation which would make TEE necessary.  This would be high risk.  For this weekend we will see how she does with the change in CVVHD parameters. Continue milrinone.  Continue amio.   Fayrene Fearing [facility] 02/21/2014 9:19 AM

## 2014-02-22 LAB — GLUCOSE, CAPILLARY
Glucose-Capillary: 113 mg/dL — ABNORMAL HIGH (ref 70–99)
Glucose-Capillary: 216 mg/dL — ABNORMAL HIGH (ref 70–99)
Glucose-Capillary: 146 mg/dL — ABNORMAL HIGH (ref 70–99)
Glucose-Capillary: 163 mg/dL — ABNORMAL HIGH (ref 70–99)
Glucose-Capillary: 214 mg/dL — ABNORMAL HIGH (ref 70–99)
Glucose-Capillary: 192 mg/dL — ABNORMAL HIGH (ref 70–99)
Glucose-Capillary: 167 mg/dL — ABNORMAL HIGH (ref 70–99)
Glucose-Capillary: 204 mg/dL — ABNORMAL HIGH (ref 70–99)

## 2014-02-22 LAB — CARBOXYHEMOGLOBIN
Carboxyhemoglobin: 1.5 % (ref 0.5–1.5)
Methemoglobin: 0.8 % (ref 0.0–1.5)
O2 Saturation: 46.3 %
Total hemoglobin: 12.2 g/dL (ref 12.0–16.0)

## 2014-02-22 LAB — RENAL FUNCTION PANEL
ALBUMIN: 3 g/dL — AB (ref 3.5–5.2)
Albumin: 2.9 g/dL — ABNORMAL LOW (ref 3.5–5.2)
Anion gap: 14 (ref 5–15)
Anion gap: 14 (ref 5–15)
BUN: 26 mg/dL — ABNORMAL HIGH (ref 6–23)
BUN: 26 mg/dL — ABNORMAL HIGH (ref 6–23)
CO2: 25 mEq/L (ref 19–32)
CO2: 24 mEq/L (ref 19–32)
CREATININE: 1.1 mg/dL (ref 0.50–1.10)
Calcium: 8.9 mg/dL (ref 8.4–10.5)
Calcium: 8.9 mg/dL (ref 8.4–10.5)
Chloride: 98 mEq/L (ref 96–112)
Chloride: 95 mEq/L — ABNORMAL LOW (ref 96–112)
Creatinine, Ser: 1.13 mg/dL — ABNORMAL HIGH (ref 0.50–1.10)
GFR calc Af Amer: 54 mL/min — ABNORMAL LOW (ref >90)
GFR calc Af Amer: 56 mL/min — ABNORMAL LOW (ref >90)
GFR calc non Af Amer: 47 mL/min — ABNORMAL LOW (ref >90)
GFR, EST NON AFRICAN AMERICAN: 48 mL/min — AB (ref >90)
Glucose, Bld: 128 mg/dL — ABNORMAL HIGH (ref 70–99)
Glucose, Bld: 160 mg/dL — ABNORMAL HIGH (ref 70–99)
PHOSPHORUS: 2.7 mg/dL (ref 2.3–4.6)
Phosphorus: 2.2 mg/dL — ABNORMAL LOW (ref 2.3–4.6)
Potassium: 3.9 mEq/L (ref 3.7–5.3)
Potassium: 4 mEq/L (ref 3.7–5.3)
Sodium: 137 mEq/L (ref 137–147)
Sodium: 133 mEq/L — ABNORMAL LOW (ref 137–147)

## 2014-02-22 LAB — CBC
HCT: 32.3 % — ABNORMAL LOW (ref 36.0–46.0)
Hemoglobin: 9.9 g/dL — ABNORMAL LOW (ref 12.0–15.0)
MCH: 30.3 pg (ref 26.0–34.0)
MCHC: 30.7 g/dL (ref 30.0–36.0)
MCV: 98.8 fL (ref 78.0–100.0)
Platelets: 120 10*3/uL — ABNORMAL LOW (ref 150–400)
RBC: 3.27 MIL/uL — ABNORMAL LOW (ref 3.87–5.11)
RDW: 18.8 % — ABNORMAL HIGH (ref 11.5–15.5)
WBC: 11.8 10*3/uL — ABNORMAL HIGH (ref 4.0–10.5)

## 2014-02-22 LAB — HEPARIN LEVEL (UNFRACTIONATED): Heparin Unfractionated: 0.73 IU/mL — ABNORMAL HIGH (ref 0.30–0.70)

## 2014-02-22 LAB — MAGNESIUM: Magnesium: 2.5 mg/dL (ref 1.5–2.5)

## 2014-02-22 MED ORDER — GERHARDT'S BUTT CREAM
TOPICAL_CREAM | Freq: Three times a day (TID) | CUTANEOUS | Status: DC
  Administered 2014-02-22 (×2): via TOPICAL
  Administered 2014-02-22 (×2): 1 via TOPICAL
  Administered 2014-02-23 (×2): via TOPICAL
  Administered 2014-02-23: 1 via TOPICAL
  Administered 2014-02-24 – 2014-02-26 (×8): via TOPICAL
  Administered 2014-02-27: 1 via TOPICAL
  Administered 2014-02-28 – 2014-03-01 (×4): via TOPICAL
  Administered 2014-03-01 (×2): 1 via TOPICAL
  Administered 2014-03-02 – 2014-03-04 (×7): via TOPICAL
  Administered 2014-03-04: 1 via TOPICAL
  Administered 2014-03-04 – 2014-03-05 (×3): via TOPICAL
  Administered 2014-03-05: 1 via TOPICAL
  Administered 2014-03-06 (×2): via TOPICAL
  Filled 2014-02-22 (×2): qty 1

## 2014-02-22 MED ORDER — TRAMADOL 5 MG/ML ORAL SUSPENSION: 50.0000 mg | Freq: Four times a day (QID) | ORAL | Status: DC | PRN

## 2014-02-22 MED ORDER — POTASSIUM & SODIUM PHOSPHATES 280-160-250 MG PO PACK
1.0000 | PACK | Freq: Three times a day (TID) | ORAL | Status: DC
  Administered 2014-02-22 – 2014-03-06 (×36): 1
  Filled 2014-02-22 (×39): qty 1

## 2014-02-22 MED ORDER — POTASSIUM PHOSPHATES 15 MMOLE/5ML IV SOLN
10.0000 mmol | Freq: Once | INTRAVENOUS | Status: AC
  Administered 2014-02-22: 10 mmol via INTRAVENOUS
  Filled 2014-02-22: qty 3.33

## 2014-02-22 MED ORDER — TRAMADOL HCL 50 MG PO TABS
50.0000 mg | ORAL_TABLET | Freq: Four times a day (QID) | ORAL | Status: DC | PRN
  Administered 2014-02-22 – 2014-02-25 (×5): 50 mg via ORAL
  Filled 2014-02-22 (×5): qty 1

## 2014-02-22 NOTE — Progress Notes (Signed)
SUBJECTIVE:  She is not in distress.  She is much more awake today.  She complains of pain in her feet.  No SOB.  No chest pain.      PHYSICAL EXAM Filed Vitals:   02/22/14 0700 02/22/14 0725 02/22/14 0800 02/22/14 0824  BP: 89/53  97/55   Pulse: 106 104 101   Temp:    97.6 F (36.4 C)  TempSrc:    Oral  Resp: 21 19 20    Height:      Weight:      SpO2: 98% 99% 98%    General:  Chronically ill appearing Lungs:  Decreased breath sounds Heart:  Irregular Abdomen:  Decreased breath sounds Extremities:  No edema, severe muscle wasting.   LABS: Lab Results  Component Value Date   TROPONINI 1.86* 01/25/2014   Results for orders placed during the hospital encounter of 01/25/14 (from the past 24 hour(s))  GLUCOSE, CAPILLARY     Status: Abnormal   Collection Time    02/21/14 11:40 AM      Result Value Ref Range   Glucose-Capillary 230 (*) 70 - 99 mg/dL   Comment 1 Notify RN    RENAL FUNCTION PANEL     Status: Abnormal   Collection Time    02/21/14  3:03 PM      Result Value Ref Range   Sodium 136 (*) 137 - 147 mEq/L   Potassium 3.7  3.7 - 5.3 mEq/L   Chloride 99  96 - 112 mEq/L   CO2 24  19 - 32 mEq/L   Glucose, Bld 111 (*) 70 - 99 mg/dL   BUN 27 (*) 6 - 23 mg/dL   Creatinine, Ser 1.61  0.50 - 1.10 mg/dL   Calcium 8.5  8.4 - 09.6 mg/dL   Phosphorus 2.0 (*) 2.3 - 4.6 mg/dL   Albumin 2.7 (*) 3.5 - 5.2 g/dL   GFR calc non Af Amer 50 (*) >90 mL/min   GFR calc Af Amer 58 (*) >90 mL/min   Anion gap 13  5 - 15  HEPARIN LEVEL (UNFRACTIONATED)     Status: None   Collection Time    02/21/14  3:04 PM      Result Value Ref Range   Heparin Unfractionated 0.56  0.30 - 0.70 IU/mL  GLUCOSE, CAPILLARY     Status: Abnormal   Collection Time    02/21/14  3:21 PM      Result Value Ref Range   Glucose-Capillary 113 (*) 70 - 99 mg/dL  GLUCOSE, CAPILLARY     Status: Abnormal   Collection Time    02/21/14  3:41 PM      Result Value Ref Range   Glucose-Capillary 111 (*) 70 - 99  mg/dL  GLUCOSE, CAPILLARY     Status: Abnormal   Collection Time    02/21/14  7:55 PM      Result Value Ref Range   Glucose-Capillary 234 (*) 70 - 99 mg/dL   Comment 1 Documented in Chart     Comment 2 Notify RN    GLUCOSE, CAPILLARY     Status: Abnormal   Collection Time    02/21/14 11:59 PM      Result Value Ref Range   Glucose-Capillary 216 (*) 70 - 99 mg/dL  GLUCOSE, CAPILLARY     Status: Abnormal   Collection Time    02/22/14  3:31 AM      Result Value Ref Range   Glucose-Capillary 146 (*) 70 -  99 mg/dL   Comment 1 Documented in Chart     Comment 2 Notify RN    CARBOXYHEMOGLOBIN     Status: None   Collection Time    02/22/14  3:42 AM      Result Value Ref Range   Total hemoglobin 12.2  12.0 - 16.0 g/dL   O2 Saturation 40.946.3     Carboxyhemoglobin 1.5  0.5 - 1.5 %   Methemoglobin 0.8  0.0 - 1.5 %  CBC     Status: Abnormal   Collection Time    02/22/14  3:45 AM      Result Value Ref Range   WBC 11.8 (*) 4.0 - 10.5 K/uL   RBC 3.27 (*) 3.87 - 5.11 MIL/uL   Hemoglobin 9.9 (*) 12.0 - 15.0 g/dL   HCT 81.132.3 (*) 91.436.0 - 78.246.0 %   MCV 98.8  78.0 - 100.0 fL   MCH 30.3  26.0 - 34.0 pg   MCHC 30.7  30.0 - 36.0 g/dL   RDW 95.618.8 (*) 21.311.5 - 08.615.5 %   Platelets 120 (*) 150 - 400 K/uL  MAGNESIUM     Status: None   Collection Time    02/22/14  3:45 AM      Result Value Ref Range   Magnesium 2.5  1.5 - 2.5 mg/dL  HEPARIN LEVEL (UNFRACTIONATED)     Status: Abnormal   Collection Time    02/22/14  3:45 AM      Result Value Ref Range   Heparin Unfractionated 0.73 (*) 0.30 - 0.70 IU/mL  RENAL FUNCTION PANEL     Status: Abnormal   Collection Time    02/22/14  3:45 AM      Result Value Ref Range   Sodium 137  137 - 147 mEq/L   Potassium 3.9  3.7 - 5.3 mEq/L   Chloride 98  96 - 112 mEq/L   CO2 25  19 - 32 mEq/L   Glucose, Bld 128 (*) 70 - 99 mg/dL   BUN 26 (*) 6 - 23 mg/dL   Creatinine, Ser 5.781.13 (*) 0.50 - 1.10 mg/dL   Calcium 8.9  8.4 - 46.910.5 mg/dL   Phosphorus 2.2 (*) 2.3 - 4.6  mg/dL   Albumin 2.9 (*) 3.5 - 5.2 g/dL   GFR calc non Af Amer 47 (*) >90 mL/min   GFR calc Af Amer 54 (*) >90 mL/min   Anion gap 14  5 - 15  GLUCOSE, CAPILLARY     Status: Abnormal   Collection Time    02/22/14  7:51 AM      Result Value Ref Range   Glucose-Capillary 163 (*) 70 - 99 mg/dL    Intake/Output Summary (Last 24 hours) at 02/22/14 [id number] Last data filed at 02/22/14 [id number]  Gross per 24 hour  Intake 2684.22 ml  Output   2314 ml  Net 370.22 ml    ASSESSMENT AND PLAN:  CAD/CABG:   CABG x 4 with MVR  CARDIOGENIC SHOCK:  RV failure.  Not able to tolerate dobutamine and no improvement with milrinone initially.  However milrinone was restarted.  CoOx up again slightly 46.3.  CVP still low at 5.  Renal is keeping I/O even. She likely needs to be positive slightly today.  Continue milrinone.  ATRIAL FIB:   We can consider DCCV  However, she has only been on IV heparin since Aug 5th.  She would need TEE/DCCV which would be high risk.  I will discuss this with the  CHF team.  For now continue amio.   Fayrene Fearing Louna Rothgeb 02/22/2014 9:22 AM

## 2014-02-22 NOTE — Progress Notes (Addendum)
Patient ID: [name], female   DOB: [birth year], 73 y.o.   MRN: [id number] EVENING ROUNDS NOTE :     [address]       [place] [address]             [phone]                 28 Days Post-Op Procedure(s) (LRB): CORONARY ARTERY BYPASS GRAFTING TIMES FOUR USING LEFT INTERNAL MAMMARY ARTERY TO LAD, SAPHENOUS VEIN GRAFTS TO OM1, OM2, AND PDA (N/A) MITRAL VALVE (MV) REPLACEMENT (N/A)  Total Length of Stay:  LOS: 28 days  BP 107/70  Pulse 108  Temp(Src) 98.1 F (36.7 C) (Axillary)  Resp 21  Ht 5\' 7"  (1.702 m)  Wt 182 lb 15.7 oz (83 kg)  BMI 28.65 kg/m2  SpO2 97%  .Intake/Output     08/08 0701 - 08/09 0700 08/09 0701 - 08/10 0700   I.V. (mL/kg) 1821.6 (21.9) 511.6 (6.2)   Other     NG/GT 920 560   IV Piggyback  210   Total Intake(mL/kg) 2741.6 (33) 1281.6 (15.4)   Other 2206 1439   Stool 200    Total Output 2406 1439   Net +335.6 -157.4          . sodium chloride Stopped (02/20/14 0700)  . sodium chloride Stopped (02/20/14 1830)  . amiodarone 30 mg/hr (02/22/14 1600)  . DOBUTamine Stopped (02/19/14 1337)  . heparin 1,500 Units/hr (02/22/14 1600)  . milrinone 0.25 mcg/kg/min (02/22/14 1600)  . norepinephrine (LEVOPHED) Adult infusion 20.053 mcg/min (02/22/14 1600)  . dialysis replacement fluid (prismasate) 300 mL/hr at 02/22/14 0634  . dialysis replacement fluid (prismasate) 200 mL/hr at 02/22/14 0028  . dialysate (PRISMASATE) 2,000 mL/hr at 02/22/14 1156     Lab Results  Component Value Date   WBC 11.8* 02/22/2014   HGB 9.9* 02/22/2014   HCT 32.3* 02/22/2014   PLT 120* 02/22/2014   GLUCOSE 160* 02/22/2014   CHOL 55 02/06/2014   TRIG 115 01/26/2014   HDL 20* 01/26/2014   LDLCALC 39 01/26/2014   ALT 23 02/19/2014   AST 36 02/19/2014   NA 133* 02/22/2014   K 4.0 02/22/2014   CL 95* 02/22/2014   CREATININE 1.10 02/22/2014   BUN 26* 02/22/2014   CO2 24 02/22/2014   TSH 6.630* 02/11/2014   INR 1.73* 01/26/2014   HGBA1C 8.4* 01/26/2014   Stable day Swallow  evaluation in am   Delight Ovens MD  Beeper [phone] Office [phone] 02/22/2014 6:23 PM

## 2014-02-22 NOTE — Progress Notes (Signed)
ANTICOAGULATION CONSULT NOTE - Follow Up Consult  Pharmacy Consult for Heparin  Indication: atrial fibrillation  No Known Allergies  Patient Measurements: Height: 5\' 7"  (170.2 cm) Weight: 182 lb 15.7 oz (83 kg) IBW/kg (Calculated) : 61.6  Vital Signs: Temp: 97.5 F (36.4 C) (08/09 0400) Temp src: Axillary (08/09 0400) BP: 89/53 mmHg (08/09 0700) Pulse Rate: 104 (08/09 0725)  Labs:  Recent Labs  02/19/14 1318  02/20/14 0400  02/21/14 0327 02/21/14 1503 02/21/14 1504 02/22/14 0345  HGB  --   < > 9.9*  --  10.1*  --   --  9.9*  HCT  --   --  31.9*  --  33.5*  --   --  32.3*  PLT  --   --  185  --  129*  --   --  120*  APTT 57*  --   --   --   --   --   --   --   HEPARINUNFRC  --   < > <0.10*  < > 0.70  --  0.56 0.73*  CREATININE  --   < > 1.22*  < > 1.08 1.07  --  1.13*  < > = values in this interval not displayed.  Estimated Creatinine Clearance: 48.4 ml/min (by C-G formula based on Cr of 1.13).   Assessment: 73 y/o F on CRRT for acute renal failure post CABG.  She is on a heparin drip for prosthetic MVR and afib at 1600 uts/hr with one therapeutic level but is now slightly SUPRAtherapeutic at 0.73. H/H is stable but platelets continue to decline to 120 today, no bleeding noted. HIT negative on 7/15. Other labs as above. No issues per RN.   Goal of Therapy:  Heparin level 0.3-0.7 units/ml Monitor platelets by anticoagulation protocol: Yes   Plan:  - Decrease heparin to 1500 u/hr - Daily HL, CBC - Monitor platelets closely and s/s bleeding  Margie Billet, PharmD Clinical Pharmacist - Resident Pager: [phone] Pharmacy: [phone] 02/22/2014 7:26 AM

## 2014-02-22 NOTE — Progress Notes (Signed)
Patient ID: [name], female   DOB: 1940-10-08, 73 y.o.   MRN: [id number] TCTS DAILY ICU PROGRESS NOTE                   [address]            [place] [id number]          [phone]   28 Days Post-Op Procedure(s) (LRB): CORONARY ARTERY BYPASS GRAFTING TIMES FOUR USING LEFT INTERNAL MAMMARY ARTERY TO LAD, SAPHENOUS VEIN GRAFTS TO OM1, OM2, AND PDA (N/A) MITRAL VALVE (MV) REPLACEMENT (N/A)  Total Length of Stay:  LOS: 28 days   Subjective: Mental status continues to improve , moves right arm better, talking but softly  Objective: Vital signs in last 24 hours: Temp:  [96.3 F (35.7 C)-98.5 F (36.9 C)] 97.5 F (36.4 C) (08/09 0400) Pulse Rate:  [91-124] 104 (08/09 0725) Cardiac Rhythm:  [-] Atrial flutter (08/09 0700) Resp:  [13-25] 19 (08/09 0725) BP: (87-127)/(44-106) 89/53 mmHg (08/09 0700) SpO2:  [97 %-100 %] 99 % (08/09 0725) Weight:  [182 lb 15.7 oz (83 kg)] 182 lb 15.7 oz (83 kg) (08/09 0400)  Filed Weights   02/20/14 0500 02/21/14 0500 02/22/14 0400  Weight: 185 lb (83.915 kg) 182 lb 12.2 oz (82.9 kg) 182 lb 15.7 oz (83 kg)    Weight change: 3.5 oz (0.1 kg)   Hemodynamic parameters for last 24 hours: CVP:  [0 mmHg-6 mmHg] 5 mmHg  Intake/Output from previous day: 08/08 0701 - 08/09 0700 In: 2741.6 [I.V.:1821.6; NG/GT:920] Out: 2406 [Stool:200]  Intake/Output this shift:    Current Meds: Scheduled Meds: . aspirin  324 mg Per Tube Daily  . bisacodyl  10 mg Oral Daily   Or  . bisacodyl  10 mg Rectal Daily  . chlorhexidine  15 mL Mouth Rinse BID  . feeding supplement (NEPRO CARB STEADY)  1,000 mL Per Tube Q24H  . feeding supplement (PRO-STAT SUGAR FREE 64)  30 mL Per Tube QID  . [name]'s butt cream   Topical TID  . insulin aspart  0-24 Units Subcutaneous 6 times per day  . insulin glargine  25 Units Subcutaneous BID  . levalbuterol  0.63 mg Nebulization Q6H  . levothyroxine  75 mcg Per Tube QAC breakfast  . pantoprazole sodium   40 mg Per Tube Q1200  . potassium & sodium phosphates  1 packet Per Tube TID  . potassium phosphate IVPB (mmol)  10 mmol Intravenous Once  . sodium chloride  10-40 mL Intracatheter Q12H   Continuous Infusions: . sodium chloride Stopped (02/20/14 0700)  . sodium chloride Stopped (02/20/14 1830)  . amiodarone 30 mg/hr (02/22/14 0700)  . DOBUTamine Stopped (02/19/14 1337)  . heparin 1,500 Units/hr (02/22/14 0725)  . milrinone 0.25 mcg/kg/min (02/22/14 0700)  . norepinephrine (LEVOPHED) Adult infusion 20 mcg/min (02/22/14 0700)  . dialysis replacement fluid (prismasate) 300 mL/hr at 02/22/14 0634  . dialysis replacement fluid (prismasate) 200 mL/hr at 02/22/14 0028  . dialysate (PRISMASATE) 2,000 mL/hr at 02/22/14 0700   PRN Meds:.acetaminophen, heparin, heparin, ondansetron (ZOFRAN) IV, oxyCODONE, sodium chloride, sodium chloride  General appearance: alert, cooperative, appears older than stated age and no distress Neurologic: intact Heart: irregularly irregular rhythm Lungs: diminished breath sounds bibasilar Abdomen: soft, non-tender; bowel sounds normal; no masses,  no organomegaly Extremities: extremities normal, atraumatic, no cyanosis or edema and Homans sign is negative, no sign of DVT Wound: sternum healing -stable  Lab Results: CBC: Recent Labs  02/21/14 0327  02/22/14 0345  WBC 11.9* 11.8*  HGB 10.1* 9.9*  HCT 33.5* 32.3*  PLT 129* 120*   BMET:  Recent Labs  02/21/14 1503 02/22/14 0345  NA 136* 137  K 3.7 3.9  CL 99 98  CO2 24 25  GLUCOSE 111* 128*  BUN 27* 26*  CREATININE 1.07 1.13*  CALCIUM 8.5 8.9    PT/INR: No results found for this basename: LABPROT, INR,  in the last 72 hours Radiology: Dg Chest Port 1 View  02/21/2014   CLINICAL DATA:  Status post cardiac surgery.  Check chest tube.  EXAM: PORTABLE CHEST - 1 VIEW  COMPARISON:  One-view chest 02/19/2014.  FINDINGS: A small bore feeding tube courses off the inferior border of the film. A left IJ  catheter is stable in position. No chest tube is in place. A right-sided PICC line is stable in position.  The heart size is normal. The patient is status post mitral valve replacement. Mild edema is improved. A small left pleural effusion and associated atelectasis is again noted.  IMPRESSION: 1. Stable support apparatus. 2. Improving aeration. 3. Small left pleural effusion and associated atelectasis.   Electronically Signed   By: [name] M.D.   On: 02/21/2014 08:50     Assessment/Plan: S/P Procedure(s) (LRB): CORONARY ARTERY BYPASS GRAFTING TIMES FOUR USING LEFT INTERNAL MAMMARY ARTERY TO LAD, SAPHENOUS VEIN GRAFTS TO OM1, OM2, AND PDA (N/A) MITRAL VALVE (MV) REPLACEMENT (N/A) Still a fib flutter More alert- try swallowing evaluation in am Would benefit by sinus rhythm Check bladder scan, foley out     [name] B 02/22/2014 7:58 AM

## 2014-02-22 NOTE — Progress Notes (Signed)
CVVHD Management.  PO4 sl low, will replace IV and start PO4 per FT.  Rest of lytes OK. Keeping even as CVP 4-6.

## 2014-02-23 ENCOUNTER — Inpatient Hospital Stay (HOSPITAL_COMMUNITY): Payer: Medicare Other

## 2014-02-23 LAB — RENAL FUNCTION PANEL
ALBUMIN: 3.3 g/dL — AB (ref 3.5–5.2)
Anion gap: 13 (ref 5–15)
BUN: 24 mg/dL — ABNORMAL HIGH (ref 6–23)
CALCIUM: 8.8 mg/dL (ref 8.4–10.5)
CO2: 25 mEq/L (ref 19–32)
CREATININE: 1.03 mg/dL (ref 0.50–1.10)
Chloride: 98 mEq/L (ref 96–112)
GFR calc Af Amer: 61 mL/min — ABNORMAL LOW (ref >90)
GFR calc non Af Amer: 52 mL/min — ABNORMAL LOW (ref >90)
Glucose, Bld: 186 mg/dL — ABNORMAL HIGH (ref 70–99)
Phosphorus: 2.4 mg/dL (ref 2.3–4.6)
Potassium: 4.1 mEq/L (ref 3.7–5.3)
Sodium: 136 mEq/L — ABNORMAL LOW (ref 137–147)

## 2014-02-23 LAB — GLUCOSE, CAPILLARY
Glucose-Capillary: 109 mg/dL — ABNORMAL HIGH (ref 70–99)
Glucose-Capillary: 165 mg/dL — ABNORMAL HIGH (ref 70–99)
Glucose-Capillary: 186 mg/dL — ABNORMAL HIGH (ref 70–99)
Glucose-Capillary: 187 mg/dL — ABNORMAL HIGH (ref 70–99)
Glucose-Capillary: 196 mg/dL — ABNORMAL HIGH (ref 70–99)
Glucose-Capillary: 200 mg/dL — ABNORMAL HIGH (ref 70–99)
Glucose-Capillary: 201 mg/dL — ABNORMAL HIGH (ref 70–99)

## 2014-02-23 LAB — MAGNESIUM: MAGNESIUM: 2.6 mg/dL — AB (ref 1.5–2.5)

## 2014-02-23 LAB — HEPARIN LEVEL (UNFRACTIONATED): Heparin Unfractionated: 0.31 IU/mL (ref 0.30–0.70)

## 2014-02-23 LAB — BASIC METABOLIC PANEL
Anion gap: 15 (ref 5–15)
BUN: 26 mg/dL — ABNORMAL HIGH (ref 6–23)
CO2: 25 mEq/L (ref 19–32)
Calcium: 8.8 mg/dL (ref 8.4–10.5)
Chloride: 96 mEq/L (ref 96–112)
Creatinine, Ser: 1.11 mg/dL — ABNORMAL HIGH (ref 0.50–1.10)
GFR calc Af Amer: 55 mL/min — ABNORMAL LOW (ref >90)
GFR calc non Af Amer: 48 mL/min — ABNORMAL LOW (ref >90)
Glucose, Bld: 124 mg/dL — ABNORMAL HIGH (ref 70–99)
Potassium: 4 mEq/L (ref 3.7–5.3)
Sodium: 136 mEq/L — ABNORMAL LOW (ref 137–147)

## 2014-02-23 LAB — CBC
HCT: 30.9 % — ABNORMAL LOW (ref 36.0–46.0)
Hemoglobin: 9.7 g/dL — ABNORMAL LOW (ref 12.0–15.0)
MCH: 30.1 pg (ref 26.0–34.0)
MCHC: 31.4 g/dL (ref 30.0–36.0)
MCV: 96 fL (ref 78.0–100.0)
Platelets: 152 10*3/uL (ref 150–400)
RBC: 3.22 MIL/uL — ABNORMAL LOW (ref 3.87–5.11)
RDW: 18.7 % — ABNORMAL HIGH (ref 11.5–15.5)
WBC: 12.9 10*3/uL — ABNORMAL HIGH (ref 4.0–10.5)

## 2014-02-23 LAB — CARBOXYHEMOGLOBIN
Carboxyhemoglobin: 1.7 % — ABNORMAL HIGH (ref 0.5–1.5)
Carboxyhemoglobin: 2 % — ABNORMAL HIGH (ref 0.5–1.5)
Methemoglobin: 0.7 % (ref 0.0–1.5)
Methemoglobin: 0.9 % (ref 0.0–1.5)
O2 Saturation: 47.8 %
O2 Saturation: 55 %
TOTAL HEMOGLOBIN: 8.9 g/dL — AB (ref 12.0–16.0)
Total hemoglobin: 10.9 g/dL — ABNORMAL LOW (ref 12.0–16.0)

## 2014-02-23 LAB — PHOSPHORUS: Phosphorus: 2.5 mg/dL (ref 2.3–4.6)

## 2014-02-23 MED ORDER — ALBUMIN HUMAN 5 % IV SOLN: 12.5000 g | Freq: Once | INTRAVENOUS | Status: AC

## 2014-02-23 MED ORDER — ALBUMIN HUMAN 5 % IV SOLN
INTRAVENOUS | Status: AC
  Administered 2014-02-23: 12.5 g
  Filled 2014-02-23: qty 250

## 2014-02-23 MED ORDER — ALBUMIN HUMAN 5 % IV SOLN
12.5000 g | Freq: Once | INTRAVENOUS | Status: AC
  Administered 2014-02-23: 12.5 g via INTRAVENOUS
  Filled 2014-02-23: qty 250

## 2014-02-23 MED ORDER — SODIUM CHLORIDE 0.9 % IV SOLN
INTRAVENOUS | Status: DC
  Administered 2014-02-23: 08:00:00 via INTRAVENOUS

## 2014-02-23 MED ORDER — MILRINONE IN DEXTROSE 20 MG/100ML IV SOLN
0.2500 ug/kg/min | INTRAVENOUS | Status: DC
  Administered 2014-02-24 – 2014-02-25 (×2): 0.125 ug/kg/min via INTRAVENOUS
  Administered 2014-02-26 – 2014-03-05 (×12): 0.25 ug/kg/min via INTRAVENOUS
  Filled 2014-02-23 (×15): qty 100

## 2014-02-23 NOTE — Progress Notes (Signed)
Stable day  No new events  Continue current care

## 2014-02-23 NOTE — Evaluation (Addendum)
Clinical/Bedside Swallow Evaluation Patient Details  Name: [name] MRN: [id number] Date of Birth: [birth year]  Today's Date: 02/23/2014 Time: 1215-1230 SLP Time Calculation (min): 15 min  Past Medical History:  Past Medical History  Diagnosis Date  . History of MI (myocardial infarction)     PCI done @ [facility] (cannot get cath report on Care Everywhere(  . Essential hypertension   . TIA (transient ischemic attack)   . Thyroid disease   . Anxiety   . Diabetes mellitus type 2 with peripheral artery disease     On insulin  . PAD (peripheral artery disease)   . Hyperlipidemia associated with type 2 diabetes mellitus   . A-fib   . ARF (acute renal failure)    Past Surgical History:  Past Surgical History  Procedure Laterality Date  . Cardiac catheterization      PCI - unknown vessel or stent; unknown date; @ [facility].  . Abdominal hysterectomy    . Cholecystectomy    . Coronary artery bypass graft N/A 01/25/2014    Procedure: CORONARY ARTERY BYPASS GRAFTING TIMES FOUR USING LEFT INTERNAL MAMMARY ARTERY TO LAD, SAPHENOUS VEIN GRAFTS TO OM1, OM2, AND PDA;  Surgeon: Kerin Perna, MD;  Location: [facility] OR;  Service: Open Heart Surgery;  Laterality: N/A;  . Mitral valve replacement N/A 01/25/2014    Procedure: MITRAL VALVE (MV) REPLACEMENT;  Surgeon: Kerin Perna, MD;  Location: [facility] OR;  Service: Open Heart Surgery;  Laterality: N/A;   HPI:  Patient admitted with MI and underwent emergent CABG x 4 and MVR on 7/13. Post op course complicated by VDRF and bilateral pleural effusions and requiring CVVHD. Extubated 7/31.    Assessment / Plan / Recommendation Clinical Impression  Patient presents with severe pharyngeal sensorimotor based dysphagia with immediate overt s/s of aspiration with thin liquids via cup and pureed consistencies via teaspoon.  Recommend SLP to follow acutely with goals at this time for PO readiness/readiness for an objective assessment.            Aspiration Risk  Severe    Diet Recommendation NPO   Medication Administration: Via alternative means    Other  Recommendations     Follow Up Recommendations  LTACH;Skilled Nursing facility;24 hour supervision/assistance    Frequency and Duration min 3x week  2 weeks   Pertinent Vitals/Pain None    SLP Swallow Goals  See care plan   Swallow Study Prior Functional Status   Information not available     General HPI: Patient with past medical history of DM, MI, HTN, admitted with MI and underwent emergent CABG x 4 and MVR on 7/13. Post op course complicated by VDRF and bilateral pleural effusions and requiring CVVHD. Extubated 7/31.  Type of Study: Bedside swallow evaluation Previous Swallow Assessment: none on record Diet Prior to this Study: NPO Temperature Spikes Noted: No Respiratory Status: Room air History of Recent Intubation: Yes Length of Intubations (days): 18 days Date extubated: 02/13/14 Behavior/Cognition: Cooperative;Confused;Requires cueing Oral Cavity - Dentition: Missing dentition Self-Feeding Abilities: Able to feed self;Needs assist Patient Positioning: Upright in bed Baseline Vocal Quality: Breathy;Other (comment) (dysphonic) Volitional Cough: Cognitively unable to elicit Volitional Swallow: Unable to elicit    Oral/Motor/Sensory Function Overall Oral Motor/Sensory Function: Appears within functional limits for tasks assessed   Ice Chips Ice chips: Impaired Presentation: Spoon Oral Phase Impairments: Reduced labial seal Pharyngeal Phase Impairments: Suspected delayed Swallow;Decreased hyoid-laryngeal movement   Thin Liquid Thin Liquid: Impaired Presentation: Cup;Self Fed Pharyngeal  Phase Impairments: Suspected delayed Swallow;Cough - Immediate;Decreased hyoid-laryngeal movement Other Comments: weak reflexive cough     Nectar Thick Nectar Thick Liquid: Not tested   Honey Thick Honey Thick Liquid: Not tested   Puree Puree:  Impaired Presentation: Spoon Pharyngeal Phase Impairments: Suspected delayed Swallow;Decreased hyoid-laryngeal movement;Cough - Immediate   Solid   GO    Solid: Not tested      Fae PippinMelissa Bellamarie Pflug, M.A., CCC-SLP [phone]  Davida Falconi 02/23/2014,1:55 PM

## 2014-02-23 NOTE — Progress Notes (Signed)
Scanned bladder for 38 cc urine per order.

## 2014-02-23 NOTE — Progress Notes (Signed)
Advanced Heart Failure Rounding Note   Subjective:    [name] is a 73 yo female with a history of CAD s/p PCI at [facility], HTN, TIA, DM2 and PAD. She presented to [facility] 01/25/14 with CP,Vtach and ST changes in inferior leads and was taken to the cath lab. She had severe multivessel CAD involving 100% distal RCA with tandem 90% & 80% lesions, proximal D1 100%, proximal LAD & AVGroove Circ 90% with 80% OM2 and was evaluated by Dr. Maren Beach for CABG and IABP was placed.  Underwent emergent CABGx4 and MV replacement (LIMA-LAD, SVG to OM1, SVG to OM2 and SVG to PDA) on 01/26/14. She was transferred to the ICU on multiple pressors and IABP. UOP was sluggish and remained volume overloaded and lasix gtt was started. IABP removed 7/17. TF started for nutrition. Had difficulty with thrombocytopenia which improved and HIT studies negative and Coumadin was restarted. Underwent thoracentesis on R on 02/06/14. Extubated 7/25 but re-intubated 7/27 electively for therapeutic bronchoscopy showing no significant mucus, airways clear w/ exception of what appeared to be suction trauma at the inlet of the RUL. Underwent L CT by IR on 7/27. Her Creatinine was stable for awhile and then started trending up even on milrinone. Underwent HD cath placement 7/28 and started on CVVHD on 7/29. Extubated again 02/13/14. Has had issues with Afib/Aflutter.   ECHO 02/17/14. Reviewed personally EF 35-40% Inferior wall out. RV moderately HK with TAPSE 1.18cm  Remains on CVVHD. Switched milrinone to dobutamine last week but had to stop due to profound tachycardia. Now on levophed at 20 and milrinone 0.25. Co-ox remains low but increasing now at 47%. CVP 8. Remains very weak. Amio stopped today by TCTS.   More alert. Able to tell me she is in hospital. CVP 10.  Follows commands and speaks with soft voice. CVVHD now keeping even. Remains anuric.   Objective:   Weight Range:  Vital Signs:   Temp:  [97.9 F (36.6 C)-99.2 F (37.3 C)] 98.4 F (36.9  C) (08/10 0733) Pulse Rate:  [98-124] 106 (08/10 0900) Resp:  [14-31] 25 (08/10 0900) BP: (77-118)/(39-98) 99/49 mmHg (08/10 0900) SpO2:  [95 %-100 %] 96 % (08/10 0900) Weight:  [83.4 kg (183 lb 13.8 oz)] 83.4 kg (183 lb 13.8 oz) (08/10 0336) Last BM Date: 02/23/14  Weight change: Filed Weights   02/22/14 0400 02/23/14 0000 02/23/14 0336  Weight: 83 kg (182 lb 15.7 oz) 83.4 kg (183 lb 13.8 oz) 83.4 kg (183 lb 13.8 oz)    Intake/Output:   Intake/Output Summary (Last 24 hours) at 02/23/14 0914 Last data filed at 02/23/14 0900  Gross per 24 hour  Intake 3194.9 ml  Output   2927 ml  Net  267.9 ml     Physical Exam: CVP 12 with prominent CV waves General:  Chronically ill appearing. No resp difficulty HEENT: normal; Panda intact; L IJ HD cath Neck: supple. JVP 10Carotids 2+ bilat; no bruits. No lymphadenopathy or thryomegaly appreciated. Cor: PMI nondisplaced. Regular rate & irregular rhythm. No rubs, gallops or murmurs. Lungs: Diminished in the bases Abdomen: soft, nontender, nondistended. No hepatosplenomegaly. No bruits or masses. Good bowel sounds. Extremities: no cyanosis, clubbing, rash, no edema; R 3L PICC Neuro: alert. Follows commands. Will whisper her name but o/w wont talk.  moves all 4 extremities w/o difficulty. Affect flat   Telemetry: Afib/flutter 90-120s  Labs: Basic Metabolic Panel:  Recent Labs Lab 02/19/14 0416  02/20/14 0400  02/21/14 0327 02/21/14 1503 02/22/14 0345 02/22/14 1556  02/23/14 0330  NA 136*  < > 137  < > 134* 136* 137 133* 136*  K 4.2  < > 4.5  < > 4.3 3.7 3.9 4.0 4.0  CL 96  < > 97  < > 95* 99 98 95* 96  CO2 25  < > 25  < > 23 24 25 24 25   GLUCOSE 181*  < > 156*  < > 243* 111* 128* 160* 124*  BUN 28*  < > 28*  < > 30* 27* 26* 26* 26*  CREATININE 1.19*  < > 1.22*  < > 1.08 1.07 1.13* 1.10 1.11*  CALCIUM 9.1  < > 9.3  < > 8.7 8.5 8.9 8.9 8.8  MG 2.5  --  2.7*  --  2.5  --  2.5  --  2.6*  PHOS 2.5  < > 2.6  < > 2.8 2.0* 2.2* 2.7 2.5   < > = values in this interval not displayed.  Liver Function Tests:  Recent Labs Lab 02/17/14 0400  02/18/14 0500  02/19/14 0416  02/20/14 1610 02/21/14 0327 02/21/14 1503 02/22/14 0345 02/22/14 1556  AST 30  --  32  --  36  --   --   --   --   --   --   ALT 16  --  20  --  23  --   --   --   --   --   --   ALKPHOS 114  --  109  --  107  --   --   --   --   --   --   BILITOT 0.8  --  0.8  --  0.8  --   --   --   --   --   --   PROT 7.5  --  8.0  --  8.3  --   --   --   --   --   --   ALBUMIN 2.6*  < > 2.7*  < > 2.9*  < > 3.2* 2.9* 2.7* 2.9* 3.0*  < > = values in this interval not displayed. No results found for this basename: LIPASE, AMYLASE,  in the last 168 hours No results found for this basename: AMMONIA,  in the last 168 hours  CBC:  Recent Labs Lab 02/19/14 0416 02/20/14 0400 02/21/14 0327 02/22/14 0345 02/23/14 0330  WBC 10.5 9.6 11.9* 11.8* 12.9*  HGB 9.1* 9.9* 10.1* 9.9* 9.7*  HCT 29.0* 31.9* 33.5* 32.3* 30.9*  MCV 96.0 96.4 99.1 98.8 96.0  PLT 209 185 129* 120* 152    Cardiac Enzymes: No results found for this basename: CKTOTAL, CKMB, CKMBINDEX, TROPONINI,  in the last 168 hours  BNP: BNP (last 3 results) No results found for this basename: PROBNP,  in the last 8760 hours   Other results:  EKG: AF  Imaging: Dg Chest Port 1 View  02/23/2014   CLINICAL DATA:  Postop evaluation.  EXAM: PORTABLE CHEST - 1 VIEW  COMPARISON:  02/21/2014  FINDINGS: There is a left jugular dialysis catheter with the tip in the upper SVC region. Feeding tube extends into the upper abdomen. Hazy densities at the left lung base are suggestive for atelectasis and a small amount of pleural fluid. Patient is status post cardiac surgery. There is no evidence for a large pneumothorax. Heart size is stable. PICC line tip extends into the lower SVC region.  IMPRESSION: Left basilar densities are suggestive for atelectasis and cannot  exclude a small pleural effusion.  Support  apparatuses appear to be stable.   Electronically Signed   By: Richarda Overlie M.D.   On: 02/23/2014 07:40     Medications:     Scheduled Medications: . aspirin  324 mg Per Tube Daily  . bisacodyl  10 mg Oral Daily   Or  . bisacodyl  10 mg Rectal Daily  . chlorhexidine  15 mL Mouth Rinse BID  . feeding supplement (NEPRO CARB STEADY)  1,000 mL Per Tube Q24H  . feeding supplement (PRO-STAT SUGAR FREE 64)  30 mL Per Tube QID  . Gerhardt's butt cream   Topical TID  . insulin aspart  0-24 Units Subcutaneous 6 times per day  . insulin glargine  25 Units Subcutaneous BID  . levalbuterol  0.63 mg Nebulization Q6H  . levothyroxine  75 mcg Per Tube QAC breakfast  . pantoprazole sodium  40 mg Per Tube Q1200  . potassium & sodium phosphates  1 packet Per Tube TID  . sodium chloride  10-40 mL Intracatheter Q12H    Infusions: . sodium chloride Stopped (02/20/14 0700)  . sodium chloride Stopped (02/20/14 1830)  . heparin 1,500 Units/hr (02/23/14 0700)  . milrinone    . norepinephrine (LEVOPHED) Adult infusion 20 mcg/min (02/23/14 0700)  . dialysis replacement fluid (prismasate) 300 mL/hr at 02/22/14 0634  . dialysis replacement fluid (prismasate) 200 mL/hr at 02/22/14 1903  . dialysate (PRISMASATE) 2,000 mL/hr at 02/23/14 0852    PRN Medications: acetaminophen, heparin, heparin, ondansetron (ZOFRAN) IV, oxyCODONE, sodium chloride, sodium chloride, traMADol   Assessment:   1) CAD - s/p CABG x 4 with MV replacement 2) A/C respiratory failure 3) AKI - On CVVHD 4) Cardiogenic shock 5) Afib/A Flutter 6) L Pleural effusion - s/p L CT 7) Anemia 8) Delerium, acute 9) ? 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 Shirlee Latch as well. She has had a large inferior MI with RV involvement. Although RV only looks moderately down on echo TAPSE is very low c/w severe RV failure and is likely the source of her ongoing cardiogenic shock.   Co-ox remains  low (47%) despite dual inotropes. Milrinone cut back today by TCTS. wil repeat co-ox this afternoon and see result. Agree with volume loading in setting of RV failure.   Major long-term issue remains whether or not her kidneys will recover. I think likelihood is low but if there is going to be any chance of recovery we need to optimize co-ox (>= 60%) and keep MAPs >=65 with pressors and volume. I doubt she will be able to tolerate intermittent HD unless we improve her hemodynamics significantly. With RV failure would adjust CVVHD rate to keep CVP 10-12 range with RV failure.   I do think she may benefit form DC-CV of AFL but TCTS concerned about risk of sedation in setting of prolonged recent intubation. Will d/w them. Continue heparin. Amio stopped by TCTS.     Brooke Greggs,MD 9:14 AM

## 2014-02-23 NOTE — Progress Notes (Signed)
ANTICOAGULATION CONSULT NOTE - Follow Up Consult  Pharmacy Consult for Heparin  Indication: atrial fibrillation  No Known Allergies  Patient Measurements: Height: 5\' 7"  (170.2 cm) Weight: 183 lb 13.8 oz (83.4 kg) IBW/kg (Calculated) : 61.6  Vital Signs: Temp: 98 F (36.7 C) (08/10 0945) Temp src: Axillary (08/10 0945) BP: 98/60 mmHg (08/10 1030) Pulse Rate: 102 (08/10 1030)  Labs:  Recent Labs  02/21/14 0327  02/21/14 1504 02/22/14 0345 02/22/14 1556 02/23/14 0330  HGB 10.1*  --   --  9.9*  --  9.7*  HCT 33.5*  --   --  32.3*  --  30.9*  PLT 129*  --   --  120*  --  152  HEPARINUNFRC 0.70  --  0.56 0.73*  --  0.31  CREATININE 1.08  < >  --  1.13* 1.10 1.11*  < > = values in this interval not displayed.  Estimated Creatinine Clearance: 49.3 ml/min (by C-G formula based on Cr of 1.11).   Assessment: 73 y/o F on CRRT for acute renal failure post CABG.  She is on a heparin drip for prosthetic MVR and afib at 1500 uts/hr, level at low end of goal this morning and has been very labile over the past couple of days. H/H is stable and platelets are up to 152 today, no bleeding noted. HIT negative on 7/15. Other labs as above. No issues per RN.   Goal of Therapy:  Heparin level 0.3-0.7 units/ml Monitor platelets by anticoagulation protocol: Yes   Plan:  - Increase heparin to 1600 u/hr - Daily HL, CBC - Monitor platelets closely and s/s bleeding  Sheppard Coil PharmD., BCPS Clinical Pharmacist Pager [phone] 02/23/2014 10:44 AM

## 2014-02-23 NOTE — Progress Notes (Signed)
29 Days Post-Op Procedure(s) (LRB): CORONARY ARTERY BYPASS GRAFTING TIMES FOUR USING LEFT INTERNAL MAMMARY ARTERY TO LAD, SAPHENOUS VEIN GRAFTS TO OM1, OM2, AND PDA (N/A) MITRAL VALVE (MV) REPLACEMENT (N/A) Subjective: Emergency CABG, MVR ( tissue) Preop severe pulmonary HTN Awake and responsive Now postop ATN on CVVH Weight at baseline; bun and creat normal- low cvp and loss of skin turgor- patient dry, high insensible loss from diarrhea With volume replacement she should be able to tolerate HD- will slowly rehydrate and keep CVVH balance even today LTAC discussed with family who agree with need for long term care and PT- patient has been bed bound for 4 weeks since emergency surgery  Objective: Vital signs in last 24 hours: Temp:  [97.9 F (36.6 C)-99.2 F (37.3 C)] 98.4 F (36.9 C) (08/10 0733) Pulse Rate:  [100-124] 107 (08/10 0815) Cardiac Rhythm:  [-] Atrial flutter (08/10 0740) Resp:  [14-31] 20 (08/10 0815) BP: (77-118)/(39-98) 91/44 mmHg (08/10 0815) SpO2:  [95 %-100 %] 98 % (08/10 0815) Weight:  [183 lb 13.8 oz (83.4 kg)] 183 lb 13.8 oz (83.4 kg) (08/10 0336)  Hemodynamic parameters for last 24 hours: CVP:  [4 mmHg-8 mmHg] 8 mmHg  Intake/Output from previous day: 08/09 0701 - 08/10 0700 In: 2795.4 [I.V.:1365.4; NG/GT:1220; IV Piggyback:210] Out: 2931 [Stool:300] Intake/Output this shift: Total I/O In: 307.7 [I.V.:57.7; IV Piggyback:250] Out: 100 [Other:97]  Exam extrem warm abd soft Low skin turgor Moves all extremities Lab Results:  Recent Labs  02/22/14 0345 02/23/14 0330  WBC 11.8* 12.9*  HGB 9.9* 9.7*  HCT 32.3* 30.9*  PLT 120* 152   BMET:  Recent Labs  02/22/14 1556 02/23/14 0330  NA 133* 136*  K 4.0 4.0  CL 95* 96  CO2 24 25  GLUCOSE 160* 124*  BUN 26* 26*  CREATININE 1.10 1.11*  CALCIUM 8.9 8.8    PT/INR: No results found for this basename: LABPROT, INR,  in the last 72 hours ABG    Component Value Date/Time   PHART 7.463*  02/17/2014 1013   HCO3 26.6* 02/17/2014 1013   TCO2 28 02/17/2014 1013   ACIDBASEDEF 0.1 01/30/2014 0339   O2SAT 47.8 02/23/2014 0500   CBG (last 3)   Recent Labs  02/23/14 0007 02/23/14 0400 02/23/14 0730  GLUCAP 187* 109* 165*    Assessment/Plan: S/P Procedure(s) (LRB): CORONARY ARTERY BYPASS GRAFTING TIMES FOUR USING LEFT INTERNAL MAMMARY ARTERY TO LAD, SAPHENOUS VEIN GRAFTS TO OM1, OM2, AND PDA (N/A) MITRAL VALVE (MV) REPLACEMENT (N/A) Cont current support for postop ATN and hope to transition to HD so PT can be started and can get OOB to chair Would not sedate for cardioversion  because patient would prob need intubation Amiodarone has never ben helpful to control atrial  Fib-flutter so will stop   Fib- flutter now at 90-100  LOS: 29 days    VAN TRIGT III,PETER 02/23/2014

## 2014-02-23 NOTE — Plan of Care (Signed)
Problem: Problem: Cardiovascular Progression Goal: NO ARRHYTHMIAS Outcome: Not Progressing Pt in Aflutter

## 2014-02-23 NOTE — Progress Notes (Signed)
Subjective: Interval History: has no complaint.  Objective: Vital signs in last 24 hours: Temp:  [97.6 F (36.4 C)-99.2 F (37.3 C)] 98.4 F (36.9 C) (08/10 0733) Pulse Rate:  [97-124] 102 (08/10 0700) Resp:  [14-27] 20 (08/10 0700) BP: (77-118)/(39-82) 96/82 mmHg (08/10 0700) SpO2:  [95 %-100 %] 99 % (08/10 0732) Weight:  [83.4 kg (183 lb 13.8 oz)] 83.4 kg (183 lb 13.8 oz) (08/10 0336) Weight change: 0.4 kg (14.1 oz)  Intake/Output from previous day: 08/09 0701 - 08/10 0700 In: 2795.4 [I.V.:1365.4; NG/GT:1220; IV Piggyback:210] Out: 2931 [Stool:300] Intake/Output this shift:    General appearance: cooperative, pale and slowed mentation Neck: L IJ cath Resp: rales LLL Cardio: irregularly irregular rhythm and systolic murmur: holosystolic 2/6, blowing at apex GI: pos bs, liver down 4 cm Extremities: extremities normal, atraumatic, no cyanosis or edema  Lab Results:  Recent Labs  02/22/14 0345 02/23/14 0330  WBC 11.8* 12.9*  HGB 9.9* 9.7*  HCT 32.3* 30.9*  PLT 120* 152   BMET:  Recent Labs  02/22/14 1556 02/23/14 0330  NA 133* 136*  K 4.0 4.0  CL 95* 96  CO2 24 25  GLUCOSE 160* 124*  BUN 26* 26*  CREATININE 1.10 1.11*  CALCIUM 8.9 8.8   No results found for this basename: PTH,  in the last 72 hours Iron Studies: No results found for this basename: IRON, TIBC, TRANSFERRIN, FERRITIN,  in the last 72 hours  Studies/Results: Dg Chest Port 1 View  02/23/2014   CLINICAL DATA:  Postop evaluation.  EXAM: PORTABLE CHEST - 1 VIEW  COMPARISON:  02/21/2014  FINDINGS: There is a left jugular dialysis catheter with the tip in the upper SVC region. Feeding tube extends into the upper abdomen. Hazy densities at the left lung base are suggestive for atelectasis and a small amount of pleural fluid. Patient is status post cardiac surgery. There is no evidence for a large pneumothorax. Heart size is stable. PICC line tip extends into the lower SVC region.  IMPRESSION: Left  basilar densities are suggestive for atelectasis and cannot exclude a small pleural effusion.  Support apparatuses appear to be stable.   Electronically Signed   By: Richarda Overlie M.D.   On: 02/23/2014 07:40    I have reviewed the patient's current medications.  Assessment/Plan: 1 AKI oliguric ATN.  Acid base/k/solute ok with CVVHD.  CVP 4-6 . Will bolus with NS as not making progress with bp or weaning pressors or inotropes.  ? RV underfilling. 2 Anemia stable 3 S/P CABG/MVR 4 Nutrition 5 DM 6 PVD P bolus, follow hb, consider epo,  DM control.  Pressors/milrinone    LOS: 29 days   Katherine Tout L 02/23/2014,7:46 AM

## 2014-02-23 NOTE — Progress Notes (Signed)
Pt pulled panda out about 3 inches, panda advanced back to original taping, KUB ordered, positive ascultation.

## 2014-02-24 DIAGNOSIS — I50810 Right heart failure, unspecified: Secondary | ICD-10-CM

## 2014-02-24 LAB — GLUCOSE, CAPILLARY
Glucose-Capillary: 167 mg/dL — ABNORMAL HIGH (ref 70–99)
Glucose-Capillary: 208 mg/dL — ABNORMAL HIGH (ref 70–99)
Glucose-Capillary: 148 mg/dL — ABNORMAL HIGH (ref 70–99)
Glucose-Capillary: 209 mg/dL — ABNORMAL HIGH (ref 70–99)
Glucose-Capillary: 173 mg/dL — ABNORMAL HIGH (ref 70–99)
Glucose-Capillary: 180 mg/dL — ABNORMAL HIGH (ref 70–99)

## 2014-02-24 LAB — COMPREHENSIVE METABOLIC PANEL
ALT: 21 U/L (ref 0–35)
AST: 30 U/L (ref 0–37)
Albumin: 3.3 g/dL — ABNORMAL LOW (ref 3.5–5.2)
Alkaline Phosphatase: 91 U/L (ref 39–117)
Anion gap: 13 (ref 5–15)
BUN: 21 mg/dL (ref 6–23)
CO2: 25 mEq/L (ref 19–32)
Calcium: 9 mg/dL (ref 8.4–10.5)
Chloride: 97 mEq/L (ref 96–112)
Creatinine, Ser: 1.02 mg/dL (ref 0.50–1.10)
GFR calc Af Amer: 61 mL/min — ABNORMAL LOW (ref >90)
GFR calc non Af Amer: 53 mL/min — ABNORMAL LOW (ref >90)
Glucose, Bld: 171 mg/dL — ABNORMAL HIGH (ref 70–99)
Potassium: 4.4 mEq/L (ref 3.7–5.3)
Sodium: 135 mEq/L — ABNORMAL LOW (ref 137–147)
Total Bilirubin: 1 mg/dL (ref 0.3–1.2)
Total Protein: 8.7 g/dL — ABNORMAL HIGH (ref 6.0–8.3)

## 2014-02-24 LAB — HEPARIN LEVEL (UNFRACTIONATED): Heparin Unfractionated: 0.5 IU/mL (ref 0.30–0.70)

## 2014-02-24 LAB — MAGNESIUM: Magnesium: 2.6 mg/dL — ABNORMAL HIGH (ref 1.5–2.5)

## 2014-02-24 LAB — CBC
HCT: 29.5 % — ABNORMAL LOW (ref 36.0–46.0)
Hemoglobin: 9.1 g/dL — ABNORMAL LOW (ref 12.0–15.0)
MCH: 30.4 pg (ref 26.0–34.0)
MCHC: 30.8 g/dL (ref 30.0–36.0)
MCV: 98.7 fL (ref 78.0–100.0)
Platelets: 159 10*3/uL (ref 150–400)
RBC: 2.99 MIL/uL — ABNORMAL LOW (ref 3.87–5.11)
RDW: 18.7 % — ABNORMAL HIGH (ref 11.5–15.5)
WBC: 10 10*3/uL (ref 4.0–10.5)

## 2014-02-24 LAB — RENAL FUNCTION PANEL
ALBUMIN: 2.2 g/dL — AB (ref 3.5–5.2)
Anion gap: 10 (ref 5–15)
BUN: 16 mg/dL (ref 6–23)
CALCIUM: 5.7 mg/dL — AB (ref 8.4–10.5)
CO2: 17 meq/L — AB (ref 19–32)
Chloride: 116 mEq/L — ABNORMAL HIGH (ref 96–112)
Creatinine, Ser: 0.68 mg/dL (ref 0.50–1.10)
GFR calc Af Amer: >90 mL/min (ref >90)
GFR, EST NON AFRICAN AMERICAN: 84 mL/min — AB (ref >90)
Glucose, Bld: 132 mg/dL — ABNORMAL HIGH (ref 70–99)
Phosphorus: 1.4 mg/dL — ABNORMAL LOW (ref 2.3–4.6)
Potassium: 3 mEq/L — ABNORMAL LOW (ref 3.7–5.3)
Sodium: 143 mEq/L (ref 137–147)

## 2014-02-24 LAB — CARBOXYHEMOGLOBIN
Carboxyhemoglobin: 2.2 % — ABNORMAL HIGH (ref 0.5–1.5)
Methemoglobin: 0.9 % (ref 0.0–1.5)
O2 Saturation: 52.6 %
Total hemoglobin: 9.3 g/dL — ABNORMAL LOW (ref 12.0–16.0)

## 2014-02-24 LAB — PHOSPHORUS: Phosphorus: 2.3 mg/dL (ref 2.3–4.6)

## 2014-02-24 MED ORDER — ALBUMIN HUMAN 5 % IV SOLN
12.5000 g | Freq: Once | INTRAVENOUS | Status: AC
  Administered 2014-02-24: 12.5 g via INTRAVENOUS
  Filled 2014-02-24: qty 250

## 2014-02-24 MED ORDER — POTASSIUM CHLORIDE 20 MEQ/15ML (10%) PO LIQD
40.0000 meq | Freq: Once | ORAL | Status: AC
  Administered 2014-02-24: 40 meq via ORAL
  Filled 2014-02-24: qty 30

## 2014-02-24 MED ORDER — ALBUMIN HUMAN 25 % IV SOLN
12.5000 g | Freq: Once | INTRAVENOUS | Status: AC
  Administered 2014-02-24: 12.5 g via INTRAVENOUS
  Filled 2014-02-24: qty 50

## 2014-02-24 MED ORDER — MIDODRINE HCL 5 MG PO TABS
5.0000 mg | ORAL_TABLET | Freq: Two times a day (BID) | ORAL | Status: DC
Start: 2014-02-24 — End: 2014-02-25
  Administered 2014-02-24 (×2): 5 mg via ORAL
  Filled 2014-02-24 (×5): qty 1

## 2014-02-24 MED ORDER — ALBUMIN HUMAN 25 % IV SOLN
12.5000 g | Freq: Once | INTRAVENOUS | Status: DC
  Filled 2014-02-24: qty 50

## 2014-02-24 NOTE — Progress Notes (Signed)
Scanned bladder for 53 cc.

## 2014-02-24 NOTE — Progress Notes (Signed)
30 Days Post-Op Procedure(s) (LRB): CORONARY ARTERY BYPASS GRAFTING TIMES FOUR USING LEFT INTERNAL MAMMARY ARTERY TO LAD, SAPHENOUS VEIN GRAFTS TO OM1, OM2, AND PDA (N/A) MITRAL VALVE (MV) REPLACEMENT (N/A) Subjective: Alert, verbal, voice remains hoarse Slowly weaning NE as volume repleted Controlled afib- flutter Objective: Vital signs in last 24 hours: Temp:  [97.1 F (36.2 C)-98.5 F (36.9 C)] 97.1 F (36.2 C) (08/11 0828) Pulse Rate:  [92-126] 109 (08/11 0930) Cardiac Rhythm:  [-] Atrial flutter (08/11 0739) Resp:  [14-40] 20 (08/11 0930) BP: (72-134)/(34-82) 118/64 mmHg (08/11 0930) SpO2:  [95 %-100 %] 96 % (08/11 0930) Weight:  [183 lb 6.8 oz (83.2 kg)] 183 lb 6.8 oz (83.2 kg) (08/11 0515)  Hemodynamic parameters for last 24 hours: CVP 12 today Intake/Output from previous day: 08/10 0701 - 08/11 0700 In: 2922.4 [I.V.:922.4; NG/GT:1250; IV Piggyback:750] Out: 2527 [Stool:200] Intake/Output this shift: Total I/O In: 256 [I.V.:66; NG/GT:140; IV Piggyback:50] Out: 194 [Other:194] EXAM- extrem warm Incisions healing  Lab Results:  Recent Labs  02/23/14 0330 02/24/14 0400  WBC 12.9* 10.0  HGB 9.7* 9.1*  HCT 30.9* 29.5*  PLT 152 159   BMET:  Recent Labs  02/23/14 1500 02/24/14 0400  NA 136* 135*  K 4.1 4.4  CL 98 97  CO2 25 25  GLUCOSE 186* 171*  BUN 24* 21  CREATININE 1.03 1.02  CALCIUM 8.8 9.0    PT/INR: No results found for this basename: LABPROT, INR,  in the last 72 hours ABG    Component Value Date/Time   PHART 7.463* 02/17/2014 1013   HCO3 26.6* 02/17/2014 1013   TCO2 28 02/17/2014 1013   ACIDBASEDEF 0.1 01/30/2014 0339   O2SAT 52.6 02/24/2014 0400   CBG (last 3)   Recent Labs  02/23/14 2309 02/24/14 0404 02/24/14 0826  GLUCAP 201* 167* 208*    Assessment/Plan: S/P Procedure(s) (LRB): CORONARY ARTERY BYPASS GRAFTING TIMES FOUR USING LEFT INTERNAL MAMMARY ARTERY TO LAD, SAPHENOUS VEIN GRAFTS TO OM1, OM2, AND PDA (N/A) MITRAL VALVE (MV)  REPLACEMENT (N/A)   Plan- wean NE for PAP > 85 mm HG Daily bladder scan    LOS: 30 days    [name],[name] 02/24/2014

## 2014-02-24 NOTE — Progress Notes (Signed)
Subjective: Interval History: has no complaint..  Objective: Vital signs in last 24 hours: Temp:  [97.6 F (36.4 C)-98.5 F (36.9 C)] 98.1 F (36.7 C) (08/11 0400) Pulse Rate:  [92-126] 113 (08/11 0645) Resp:  [14-31] 17 (08/11 0645) BP: (72-134)/(34-98) 114/57 mmHg (08/11 0645) SpO2:  [95 %-100 %] 97 % (08/11 0645) Weight:  [83.2 kg (183 lb 6.8 oz)] 83.2 kg (183 lb 6.8 oz) (08/11 0515) Weight change: -0.2 kg (-7.1 oz)  Intake/Output from previous day: 08/10 0701 - 08/11 0700 In: 2847.4 [I.V.:887.4; NG/GT:1210; IV Piggyback:750] Out: 2527 [Stool:200] Intake/Output this shift:    General appearance: alert and cooperative Resp: rales bibasilar Cardio: irregularly irregular rhythm, S1, S2 normal and systolic murmur: holosystolic 2/6, blowing at apex GI: pos bs, liver down 6 cm, soft Extremities: extremities normal, atraumatic, no cyanosis or edema  Lab Results:  Recent Labs  02/23/14 0330 02/24/14 0400  WBC 12.9* 10.0  HGB 9.7* 9.1*  HCT 30.9* 29.5*  PLT 152 159   BMET:  Recent Labs  02/23/14 1500 02/24/14 0400  NA 136* 135*  K 4.1 4.4  CL 98 97  CO2 25 25  GLUCOSE 186* 171*  BUN 24* 21  CREATININE 1.03 1.02  CALCIUM 8.8 9.0   No results found for this basename: PTH,  in the last 72 hours Iron Studies: No results found for this basename: IRON, TIBC, TRANSFERRIN, FERRITIN,  in the last 72 hours  Studies/Results: Dg Abd 1 View  02/23/2014   CLINICAL DATA:  Panda placement  EXAM: ABDOMEN - 1 VIEW  COMPARISON:  None.  FINDINGS: Interval placement of a feeding tube with the metallic tip projecting over the fourth portion of the duodenum. The bowel gas pattern is normal. No radio-opaque calculi or other significant radiographic abnormality are seen.  IMPRESSION: Interval placement of a feeding tube with the metallic portion projecting over the fourth portion of the duodenum.   Electronically Signed   By: Elige Ko   On: 02/23/2014 12:07   Dg Chest Port 1  View  02/23/2014   CLINICAL DATA:  Postop evaluation.  EXAM: PORTABLE CHEST - 1 VIEW  COMPARISON:  02/21/2014  FINDINGS: There is a left jugular dialysis catheter with the tip in the upper SVC region. Feeding tube extends into the upper abdomen. Hazy densities at the left lung base are suggestive for atelectasis and a small amount of pleural fluid. Patient is status post cardiac surgery. There is no evidence for a large pneumothorax. Heart size is stable. PICC line tip extends into the lower SVC region.  IMPRESSION: Left basilar densities are suggestive for atelectasis and cannot exclude a small pleural effusion.  Support apparatuses appear to be stable.   Electronically Signed   By: Richarda Overlie M.D.   On: 02/23/2014 07:40    I have reviewed the patient's current medications.  Assessment/Plan: 1  AKI oliguric ATN, prolonged. Good clearance of solute, K , acid.  Vol better with vol given and CVP better.  Not sure that current MAP/Coox goals are realistic or achievable clinically.  Will follow CVP closely today and if cont low, will rebolus. 2 Anemia for epo,  3 CABG/MVR  Per CTS 4 Afib 5 DM controlled 6 PAD 7 Nutrition TF P CRRT, epo, cont to keep CVP up.  Discuss MAP goals    LOS: 30 days   Najae Rathert L 02/24/2014,7:20 AM

## 2014-02-24 NOTE — Progress Notes (Signed)
Calcium 5.7 and K 3.0 called to Dr. Darrick Penna, orders received

## 2014-02-24 NOTE — Progress Notes (Signed)
Speech Language Pathology Treatment: Dysphagia  Patient Details Name: [name] MRN: [id number] DOB: [birth year] Today's Date: 02/24/2014 Time: 5573-2202 SLP Time Calculation (min): 14 min  Assessment / Plan / Recommendation Clinical Impression  Skilled treatment session focused on addressing dysphagia goals.  SLP facilitated session with Total assist for oral care; however, patient resistant to allowing SLP to complete and refused to do oral care herself.  Patient's oral mucosa appeared dried, cracked and coated with dried oral secretions which puts her at an increased risk of developing aspiration PNA even if she remains NPO; RN notified.  Patient declined oral care even with prospect of getting ice chips, which patient also declined.  As a result, session was ended; recommend to continue with current plan of care.      HPI HPI: Patient with past medical history of DM, MI, HTN, admitted with MI and underwent emergent CABG x 4 and MVR on 7/13. Post op course complicated by VDRF and bilateral pleural effusions and requiring CVVHD. Extubated 7/31.    Pertinent Vitals Pain Assessment: No/denies pain  SLP Plan  Continue with current plan of care    Recommendations Diet recommendations: NPO Medication Administration: Via alternative means              Oral Care Recommendations: Oral care Q4 per protocol Follow up Recommendations: LTACH;24 hour supervision/assistance Plan: Continue with current plan of care    GO    [name]., CCC-SLP [phone]  [name] 02/24/2014, 4:26 PM

## 2014-02-24 NOTE — Progress Notes (Signed)
ANTICOAGULATION CONSULT NOTE - Follow Up Consult  Pharmacy Consult for Heparin  Indication: atrial fibrillation  No Known Allergies  Patient Measurements: Height: 5\' 7"  (170.2 cm) Weight: 183 lb 6.8 oz (83.2 kg) IBW/kg (Calculated) : 61.6  Vital Signs: Temp: 97.8 F (36.6 C) (08/11 1217) Temp src: Axillary (08/11 1217) BP: 125/52 mmHg (08/11 1400) Pulse Rate: 108 (08/11 1400)  Labs:  Recent Labs  02/22/14 0345  02/23/14 0330 02/23/14 1500 02/24/14 0400  HGB 9.9*  --  9.7*  --  9.1*  HCT 32.3*  --  30.9*  --  29.5*  PLT 120*  --  152  --  159  HEPARINUNFRC 0.73*  --  0.31  --  0.50  CREATININE 1.13*  < > 1.11* 1.03 1.02  < > = values in this interval not displayed.  Estimated Creatinine Clearance: 53.6 ml/min (by C-G formula based on Cr of 1.02).   Assessment: 74 y/o F on CRRT for acute renal failure post CABG.  She is on a heparin drip for prosthetic MVR and afib at 1600 units/hr, levels have been very labile over the past couple of days but currently in middle of goal range. H/H is trending down and platelets up to 159 today, no bleeding noted. HIT negative on 7/15. Other labs as above. No issues per RN.   Goal of Therapy:  Heparin level 0.3-0.7 units/ml Monitor platelets by anticoagulation protocol: Yes   Plan:  - Continue heparin at 1600 u/hr - Daily HL, CBC - Monitor platelets closely and s/s bleeding  Sheppard Coil PharmD., BCPS Clinical Pharmacist Pager [phone] 02/24/2014 2:07 PM

## 2014-02-24 NOTE — Progress Notes (Signed)
NUTRITION FOLLOW UP  INTERVENTION: Continue current TF regimen RD to follow for nutrition care plan  NUTRITION DIAGNOSIS: Inadequate oral intake related to inability to eat as evidenced by NPO status, ongoing  New Goal: Pt to meet >/= 90% of their estimated nutrition needs, met  Monitor:  TF regimen & tolerance, PO diet advancement, weight, labs, I/O's  ASSESSMENT: PMHx significant for CAD, TIA, HTN, DM2, PAD. Admitted with n/v, diaphoresis and chest pain. Work-up reveals STEMI.  Underwent L heart catheterization with coronary angiogram on 7/12. Work-up reveals severe multivessel CAD. Pt then went to OR for emergent CABG x 4 on 7/13.  Patient extubated 7/31.  Chest tube removed 8/4.  Patient continues on CVVHD.  S/p bedside swallow evaluation 8/10.  Pt with severe pharyngeal dysphagia.  SLP recommending continued NPO status.  Nepro formula currently infusing at 40 ml/hr via Panda tube with Prostat liquid protein 4 times daily providing 2128 kcals, 138 gm protein, 501 ml of free water.  Disposition: LTACH.  Height: Ht Readings from Last 1 Encounters:  01/31/14 _0  (1.702 m)    Weight: -----> stable Wt Readings from Last 1 Encounters:  02/24/14 183 lb 6.8 oz (83.2 kg)  01/25/14           180 lb (81.6 kg)  BMI:  Body mass index is 28.72 kg/(m^2).   Re-estimated Nutritional Needs: Kcal: 2000-2200 Protein: 125-135 gm Fluid: per MD  Skin: leg and chest incisions  Diet Order: NPO   Intake/Output Summary (Last 24 hours) at 02/24/14 1108 Last data filed at 02/24/14 1100  Gross per 24 hour  Intake 2443.8 ml  Output   2562 ml  Net -118.2 ml    Labs:   Recent Labs Lab 02/22/14 0345  02/23/14 0330 02/23/14 1500 02/24/14 0400  NA 137  < > 136* 136* 135*  K 3.9  < > 4.0 4.1 4.4  CL 98  < > 96 98 97  CO2 25  < > _1 BUN 26*  < > 26* 24* 21  CREATININE 1.13*  < > 1.11* 1.03 1.02  CALCIUM 8.9  < > 8.8 8.8 9.0  MG 2.5  --  2.6*  --  2.6*  PHOS 2.2*  < >  2.5 2.4 2.3  GLUCOSE 128*  < > 124* 186* 171*  < > = values in this interval not displayed.  CBG (last 3)   Recent Labs  02/23/14 2309 02/24/14 0404 02/24/14 0826  GLUCAP 201* 167* 208*    Scheduled Meds: . albumin human  12.5 g Intravenous Once  . aspirin  324 mg Per Tube Daily  . bisacodyl  10 mg Oral Daily   Or  . bisacodyl  10 mg Rectal Daily  . chlorhexidine  15 mL Mouth Rinse BID  . feeding supplement (NEPRO CARB STEADY)  1,000 mL Per Tube Q24H  . feeding supplement (PRO-STAT SUGAR FREE 64)  30 mL Per Tube QID  . Gerhardt's butt cream   Topical TID  . insulin aspart  0-24 Units Subcutaneous 6 times per day  . insulin glargine  25 Units Subcutaneous BID  . levalbuterol  0.63 mg Nebulization Q6H  . levothyroxine  75 mcg Per Tube QAC breakfast  . midodrine  5 mg Oral BID WC  . pantoprazole sodium  40 mg Per Tube Q1200  . potassium & sodium phosphates  1 packet Per Tube TID  . sodium chloride  10-40 mL Intracatheter Q12H    Continuous Infusions: .  sodium chloride Stopped (02/20/14 0700)  . sodium chloride Stopped (02/20/14 1830)  . heparin 1,600 Units/hr (02/24/14 1038)  . milrinone 0.125 mcg/kg/min (02/24/14 1039)  . norepinephrine (LEVOPHED) Adult infusion 14 mcg/min (02/24/14 1001)  . dialysis replacement fluid (prismasate) 300 mL/hr at 02/24/14 0523  . dialysis replacement fluid (prismasate) 200 mL/hr at 02/23/14 1611  . dialysate (PRISMASATE) 2,000 mL/hr at 02/24/14 1020    Past Medical History  Diagnosis Date  . History of MI (myocardial infarction)     PCI done @ [facility] (cannot get cath report on Care Everywhere(  . Essential hypertension   . TIA (transient ischemic attack)   . Thyroid disease   . Anxiety   . Diabetes mellitus type 2 with peripheral artery disease     On insulin  . PAD (peripheral artery disease)   . Hyperlipidemia associated with type 2 diabetes mellitus   . A-fib   . ARF (acute renal failure)     Past Surgical History   Procedure Laterality Date  . Cardiac catheterization      PCI - unknown vessel or stent; unknown date; @ [facility].  . Abdominal hysterectomy    . Cholecystectomy    . Coronary artery bypass graft N/A 01/25/2014    Procedure: CORONARY ARTERY BYPASS GRAFTING TIMES FOUR USING LEFT INTERNAL MAMMARY ARTERY TO LAD, SAPHENOUS VEIN GRAFTS TO OM1, OM2, AND PDA;  Surgeon: Ivin Poot, MD;  Location: [place];  Service: Open Heart Surgery;  Laterality: N/A;  . Mitral valve replacement N/A 01/25/2014    Procedure: MITRAL VALVE (MV) REPLACEMENT;  Surgeon: Ivin Poot, MD;  Location: [place];  Service: Open Heart Surgery;  Laterality: N/A;    Arthur Holms, RD, LDN Pager #: [phone] After-Hours Pager #: [phone]

## 2014-02-24 NOTE — Progress Notes (Signed)
Patient examined and record reviewed.Hemodynamics stable,labs satisfactory.Patient had stable day.Continue current care.  Norepi weaned to 8 mcg Patient still appears dry- more 5% albumin ordered Consider stopping CVVH  And observe to allow vascular volume to equilibrate VAN TRIGT III,Larcenia Holaday 02/24/2014

## 2014-02-24 NOTE — Progress Notes (Signed)
Physical Therapy Treatment Patient Details Name: [name] MRN: [id number] DOB: 06-03-1941 Today's Date: March 21, 2014    History of Present Illness Pt adm with MI and underwent emergent CABG x 4 and MVR on 7/13. Post op course complicated by VDRF and bil pleural effusions and requiring CVVHD. Extubated 7/31. PMH - DM, MI, HTN,    PT Comments    Pt actively participating in exercises.  Follow Up Recommendations  LTACH     Equipment Recommendations  Other (comment) (to be determined)    Recommendations for Other Services       Precautions / Restrictions Precautions Precautions: Fall;Other (comment) Precaution Comments: CVVHD in neck, multiple lines/tubes.    Mobility  Bed Mobility                  Transfers                    Ambulation/Gait                 Stairs            Wheelchair Mobility    Modified Rankin (Stroke Patients Only)       Balance                                    Cognition Arousal/Alertness: Awake/alert Behavior During Therapy: Flat affect Overall Cognitive Status: Impaired/Different from baseline Area of Impairment: Problem solving       Following Commands: Follows one step commands consistently     Problem Solving: Requires verbal cues      Exercises General Exercises - Upper Extremity Shoulder Flexion: AAROM;Both;10 reps General Exercises - Lower Extremity Short Arc Quad: AROM;Both;10 reps;Supine Heel Slides: AAROM;Both;10 reps;Supine Hip ABduction/ADduction: AAROM;Both;10 reps;Supine Straight Leg Raises: AAROM;Both;10 reps;Supine    General Comments        Pertinent Vitals/Pain Pain Assessment: No/denies pain    Home Living                      Prior Function            PT Goals (current goals can now be found in the care plan section) Progress towards PT goals: Progressing toward goals    Frequency  Min 2X/week    PT Plan Current plan remains  appropriate    Co-evaluation             End of Session   Activity Tolerance: Patient tolerated treatment well Patient left: in bed;with call bell/phone within reach     Time: 1137-1150 PT Time Calculation (min): 13 min  Charges:  $Therapeutic Exercise: 8-22 mins                    G Codes:      Kimberleigh Mehan 2014-03-21, 12:22 PM  [facility] PT [phone]

## 2014-02-24 NOTE — Progress Notes (Signed)
Advanced Heart Failure Rounding Note   Subjective:    Ms. Siever is a 73 yo female with a history of CAD s/p PCI at [facility], HTN, TIA, DM2 and PAD. She presented to [facility] 01/25/14 with CP,Vtach and ST changes in inferior leads and was taken to the cath lab. She had severe multivessel CAD involving 100% distal RCA with tandem 90% & 80% lesions, proximal D1 100%, proximal LAD & AVGroove Circ 90% with 80% OM2 and was evaluated by Dr. Maren Beach for CABG and IABP was placed.  Underwent emergent CABGx4 and MV replacement (LIMA-LAD, SVG to OM1, SVG to OM2 and SVG to PDA) on 01/26/14. She was transferred to the ICU on multiple pressors and IABP. UOP was sluggish and remained volume overloaded and lasix gtt was started. IABP removed 7/17. TF started for nutrition. Had difficulty with thrombocytopenia which improved and HIT studies negative and Coumadin was restarted. Underwent thoracentesis on R on 02/06/14. Extubated 7/25 but re-intubated 7/27 electively for therapeutic bronchoscopy showing no significant mucus, airways clear w/ exception of what appeared to be suction trauma at the inlet of the RUL. Underwent L CT by IR on 7/27. Her Creatinine was stable for awhile and then started trending up even on milrinone. Underwent HD cath placement 7/28 and started on CVVHD on 7/29. Extubated again 02/13/14. Has had issues with Afib/Aflutter.   ECHO 02/17/14. Reviewed personally EF 35-40% Inferior wall out. RV moderately HK with TAPSE 1.18cm  Remains on CVVHD.   Working with PT. Received albumin yesterday and IVF. CVP now 11. Levophed down to 14. SBP 85-105. Co-ox at 53% Remains anuric  Objective:   Weight Range:  Vital Signs:   Temp:  [97.1 F (36.2 C)-98.5 F (36.9 C)] 97.1 F (36.2 C) (08/11 0828) Pulse Rate:  [95-126] 110 (08/11 1100) Resp:  [14-40] 20 (08/11 1100) BP: (72-134)/(16-107) 106/52 mmHg (08/11 1100) SpO2:  [95 %-100 %] 98 % (08/11 1100) Weight:  [83.2 kg (183 lb 6.8 oz)] 83.2 kg (183 lb 6.8 oz) (08/11  0515) Last BM Date: 02/24/14  Weight change: Filed Weights   02/23/14 0000 02/23/14 0336 02/24/14 0515  Weight: 83.4 kg (183 lb 13.8 oz) 83.4 kg (183 lb 13.8 oz) 83.2 kg (183 lb 6.8 oz)    Intake/Output:   Intake/Output Summary (Last 24 hours) at 02/24/14 1149 Last data filed at 02/24/14 1100  Gross per 24 hour  Intake 2443.8 ml  Output   2562 ml  Net -118.2 ml     Physical Exam: CVP 12 with prominent CV waves General:  Chronically ill appearing. No resp difficulty HEENT: normal; Panda intact; L IJ HD cath Neck: supple. JVP 12 Carotids 2+ bilat; no bruits. No lymphadenopathy or thryomegaly appreciated. Cor: PMI nondisplaced. Regular rate & irregular rhythm. No rubs, gallops or murmurs. Lungs: Diminished in the bases Abdomen: soft, nontender, nondistended. No hepatosplenomegaly. No bruits or masses. Good bowel sounds. Extremities: no cyanosis, clubbing, rash, no edema; R 3L PICC Neuro: alert. Working with [employer] all 4 extremities w/o difficulty. Affect flat   Telemetry: Afib/flutter 90-120s  Labs: Basic Metabolic Panel:  Recent Labs Lab 02/20/14 0400  02/21/14 0327  02/22/14 0345 02/22/14 1556 02/23/14 0330 02/23/14 1500 02/24/14 0400  NA 137  < > 134*  < > 137 133* 136* 136* 135*  K 4.5  < > 4.3  < > 3.9 4.0 4.0 4.1 4.4  CL 97  < > 95*  < > 98 95* 96 98 97  CO2 25  < > 23  < >  25 24 25 25 25   GLUCOSE 156*  < > 243*  < > 128* 160* 124* 186* 171*  BUN 28*  < > 30*  < > 26* 26* 26* 24* 21  CREATININE 1.22*  < > 1.08  < > 1.13* 1.10 1.11* 1.03 1.02  CALCIUM 9.3  < > 8.7  < > 8.9 8.9 8.8 8.8 9.0  MG 2.7*  --  2.5  --  2.5  --  2.6*  --  2.6*  PHOS 2.6  < > 2.8  < > 2.2* 2.7 2.5 2.4 2.3  < > = values in this interval not displayed.  Liver Function Tests:  Recent Labs Lab 02/18/14 0500  02/19/14 0416  02/21/14 1503 02/22/14 0345 02/22/14 1556 02/23/14 1500 02/24/14 0400  AST 32  --  36  --   --   --   --   --  30  ALT 20  --  23  --   --   --   --   --  21   ALKPHOS 109  --  107  --   --   --   --   --  91  BILITOT 0.8  --  0.8  --   --   --   --   --  1.0  PROT 8.0  --  8.3  --   --   --   --   --  8.7*  ALBUMIN 2.7*  < > 2.9*  < > 2.7* 2.9* 3.0* 3.3* 3.3*  < > = values in this interval not displayed. No results found for this basename: LIPASE, AMYLASE,  in the last 168 hours No results found for this basename: AMMONIA,  in the last 168 hours  CBC:  Recent Labs Lab 02/20/14 0400 02/21/14 0327 02/22/14 0345 02/23/14 0330 02/24/14 0400  WBC 9.6 11.9* 11.8* 12.9* 10.0  HGB 9.9* 10.1* 9.9* 9.7* 9.1*  HCT 31.9* 33.5* 32.3* 30.9* 29.5*  MCV 96.4 99.1 98.8 96.0 98.7  PLT 185 129* 120* 152 159    Cardiac Enzymes: No results found for this basename: CKTOTAL, CKMB, CKMBINDEX, TROPONINI,  in the last 168 hours  BNP: BNP (last 3 results) No results found for this basename: PROBNP,  in the last 8760 hours   Other results:  EKG: AF  Imaging: Dg Abd 1 View  02/23/2014   CLINICAL DATA:  Panda placement  EXAM: ABDOMEN - 1 VIEW  COMPARISON:  None.  FINDINGS: Interval placement of a feeding tube with the metallic tip projecting over the fourth portion of the duodenum. The bowel gas pattern is normal. No radio-opaque calculi or other significant radiographic abnormality are seen.  IMPRESSION: Interval placement of a feeding tube with the metallic portion projecting over the fourth portion of the duodenum.   Electronically Signed   By: Elige Ko   On: 02/23/2014 12:07   Dg Chest Port 1 View  02/23/2014   CLINICAL DATA:  Postop evaluation.  EXAM: PORTABLE CHEST - 1 VIEW  COMPARISON:  02/21/2014  FINDINGS: There is a left jugular dialysis catheter with the tip in the upper SVC region. Feeding tube extends into the upper abdomen. Hazy densities at the left lung base are suggestive for atelectasis and a small amount of pleural fluid. Patient is status post cardiac surgery. There is no evidence for a large pneumothorax. Heart size is stable. PICC  line tip extends into the lower SVC region.  IMPRESSION: Left basilar densities are suggestive for  atelectasis and cannot exclude a small pleural effusion.  Support apparatuses appear to be stable.   Electronically Signed   By: Richarda OverlieAdam  Henn M.D.   On: 02/23/2014 07:40     Medications:     Scheduled Medications: . albumin human  12.5 g Intravenous Once  . aspirin  324 mg Per Tube Daily  . bisacodyl  10 mg Oral Daily   Or  . bisacodyl  10 mg Rectal Daily  . chlorhexidine  15 mL Mouth Rinse BID  . feeding supplement (NEPRO CARB STEADY)  1,000 mL Per Tube Q24H  . feeding supplement (PRO-STAT SUGAR FREE 64)  30 mL Per Tube QID  . Gerhardt's butt cream   Topical TID  . insulin aspart  0-24 Units Subcutaneous 6 times per day  . insulin glargine  25 Units Subcutaneous BID  . levalbuterol  0.63 mg Nebulization Q6H  . levothyroxine  75 mcg Per Tube QAC breakfast  . midodrine  5 mg Oral BID WC  . pantoprazole sodium  40 mg Per Tube Q1200  . potassium & sodium phosphates  1 packet Per Tube TID  . sodium chloride  10-40 mL Intracatheter Q12H    Infusions: . sodium chloride Stopped (02/20/14 0700)  . sodium chloride Stopped (02/20/14 1830)  . heparin 1,600 Units/hr (02/24/14 1038)  . milrinone 0.125 mcg/kg/min (02/24/14 1039)  . norepinephrine (LEVOPHED) Adult infusion 14 mcg/min (02/24/14 1001)  . dialysis replacement fluid (prismasate) 300 mL/hr at 02/24/14 0523  . dialysis replacement fluid (prismasate) 200 mL/hr at 02/23/14 1611  . dialysate (PRISMASATE) 2,000 mL/hr at 02/24/14 1020    PRN Medications: acetaminophen, heparin, heparin, ondansetron (ZOFRAN) IV, oxyCODONE, sodium chloride, sodium chloride, traMADol   Assessment:   1) CAD - s/p CABG x 4 with MV replacement 2) A/C respiratory failure 3) AKI - On CVVHD 4) Cardiogenic shock 5) Afib/A Flutter 6) L Pleural effusion - s/p L CT 7) Anemia 8) Delerium, acute 9) ? 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 Shirlee LatchMclean as well. She has had a large inferior MI with RV involvement. Although RV only looks moderately down on echo TAPSE is very low c/w severe RV failure and is likely the source of her ongoing cardiogenic shock.   Somewhat improved with giving back volume but still inotrope dependent with low co-ox. CVP tracing suggest significant TR. Would continue with current plan for now. May be worth repeating echo this week.   Major long-term issue remains whether or not her kidneys will recover. I think likelihood is low but if there is going to be any chance of recovery we need to optimize co-ox (>= 60%) and keep MAPs >=65 with pressors and volume. I doubt she will be able to tolerate intermittent HD unless we improve her hemodynamics significantly. With RV failure would adjust CVVHD rate to keep CVP 10-12 range with RV failure.   I do think she may benefit form DC-CV of AFL but TCTS concerned about risk of sedation in setting of prolonged recent intubation. Will d/w them. Continue heparin. Amio stopped by TCTS.    I will be out of town tomorrow but back Thursday. Please call me on my cell phone with questions.  Daniel Bensimhon,MD 11:49 AM

## 2014-02-25 ENCOUNTER — Inpatient Hospital Stay (HOSPITAL_COMMUNITY): Payer: Medicare Other

## 2014-02-25 DIAGNOSIS — I369 Nonrheumatic tricuspid valve disorder, unspecified: Secondary | ICD-10-CM

## 2014-02-25 DIAGNOSIS — I509 Heart failure, unspecified: Secondary | ICD-10-CM

## 2014-02-25 LAB — GLUCOSE, CAPILLARY
Glucose-Capillary: 171 mg/dL — ABNORMAL HIGH (ref 70–99)
Glucose-Capillary: 172 mg/dL — ABNORMAL HIGH (ref 70–99)
Glucose-Capillary: 185 mg/dL — ABNORMAL HIGH (ref 70–99)
Glucose-Capillary: 210 mg/dL — ABNORMAL HIGH (ref 70–99)
Glucose-Capillary: 213 mg/dL — ABNORMAL HIGH (ref 70–99)
Glucose-Capillary: 88 mg/dL (ref 70–99)

## 2014-02-25 LAB — POCT I-STAT, CHEM 8
BUN: 23 mg/dL (ref 6–23)
Calcium, Ion: 1.14 mmol/L (ref 1.13–1.30)
Chloride: 98 mEq/L (ref 96–112)
Creatinine, Ser: 1 mg/dL (ref 0.50–1.10)
Glucose, Bld: 250 mg/dL — ABNORMAL HIGH (ref 70–99)
HCT: 32 % — ABNORMAL LOW (ref 36.0–46.0)
Hemoglobin: 10.9 g/dL — ABNORMAL LOW (ref 12.0–15.0)
Potassium: 4.7 mEq/L (ref 3.7–5.3)
Sodium: 134 mEq/L — ABNORMAL LOW (ref 137–147)
TCO2: 23 mmol/L (ref 0–100)

## 2014-02-25 LAB — RENAL FUNCTION PANEL
ANION GAP: 14 (ref 5–15)
Albumin: 3.8 g/dL (ref 3.5–5.2)
Albumin: 3.4 g/dL — ABNORMAL LOW (ref 3.5–5.2)
Anion gap: 14 (ref 5–15)
BUN: 24 mg/dL — ABNORMAL HIGH (ref 6–23)
BUN: 23 mg/dL (ref 6–23)
CHLORIDE: 96 meq/L (ref 96–112)
CO2: 25 mEq/L (ref 19–32)
CO2: 25 mEq/L (ref 19–32)
Calcium: 9.2 mg/dL (ref 8.4–10.5)
Calcium: 8.8 mg/dL (ref 8.4–10.5)
Chloride: 96 mEq/L (ref 96–112)
Creatinine, Ser: 1 mg/dL (ref 0.50–1.10)
Creatinine, Ser: 0.92 mg/dL (ref 0.50–1.10)
GFR calc Af Amer: 63 mL/min — ABNORMAL LOW (ref >90)
GFR calc Af Amer: 69 mL/min — ABNORMAL LOW (ref >90)
GFR calc non Af Amer: 54 mL/min — ABNORMAL LOW (ref >90)
GFR calc non Af Amer: 60 mL/min — ABNORMAL LOW (ref >90)
GLUCOSE: 172 mg/dL — AB (ref 70–99)
Glucose, Bld: 198 mg/dL — ABNORMAL HIGH (ref 70–99)
PHOSPHORUS: 3.3 mg/dL (ref 2.3–4.6)
Phosphorus: 2.1 mg/dL — ABNORMAL LOW (ref 2.3–4.6)
Potassium: 4.9 mEq/L (ref 3.7–5.3)
Potassium: 4.5 mEq/L (ref 3.7–5.3)
Sodium: 135 mEq/L — ABNORMAL LOW (ref 137–147)
Sodium: 135 mEq/L — ABNORMAL LOW (ref 137–147)

## 2014-02-25 LAB — CBC
HCT: 29.3 % — ABNORMAL LOW (ref 36.0–46.0)
Hemoglobin: 8.9 g/dL — ABNORMAL LOW (ref 12.0–15.0)
MCH: 30.5 pg (ref 26.0–34.0)
MCHC: 30.4 g/dL (ref 30.0–36.0)
MCV: 100.3 fL — ABNORMAL HIGH (ref 78.0–100.0)
Platelets: 148 10*3/uL — ABNORMAL LOW (ref 150–400)
RBC: 2.92 MIL/uL — ABNORMAL LOW (ref 3.87–5.11)
RDW: 18.7 % — ABNORMAL HIGH (ref 11.5–15.5)
WBC: 10.3 10*3/uL (ref 4.0–10.5)

## 2014-02-25 LAB — HEPARIN LEVEL (UNFRACTIONATED): Heparin Unfractionated: 0.57 IU/mL (ref 0.30–0.70)

## 2014-02-25 LAB — CARBOXYHEMOGLOBIN
Carboxyhemoglobin: 1.8 % — ABNORMAL HIGH (ref 0.5–1.5)
Carboxyhemoglobin: 2.2 % — ABNORMAL HIGH (ref 0.5–1.5)
Methemoglobin: 0.9 % (ref 0.0–1.5)
Methemoglobin: 0.7 % (ref 0.0–1.5)
O2 Saturation: 39.2 %
O2 Saturation: 46.5 %
Total hemoglobin: 10.2 g/dL — ABNORMAL LOW (ref 12.0–16.0)
Total hemoglobin: 8.6 g/dL — ABNORMAL LOW (ref 12.0–16.0)

## 2014-02-25 LAB — CALCIUM, IONIZED: Calcium, Ion: 1.18 mmol/L (ref 1.13–1.30)

## 2014-02-25 LAB — MAGNESIUM: Magnesium: 2.7 mg/dL — ABNORMAL HIGH (ref 1.5–2.5)

## 2014-02-25 MED ORDER — SODIUM CHLORIDE 0.9 % IV SOLN
INTRAVENOUS | Status: AC
  Administered 2014-02-25: 50 mL/h via INTRAVENOUS
  Administered 2014-02-26: 06:00:00 via INTRAVENOUS

## 2014-02-25 MED ORDER — PERFLUTREN LIPID MICROSPHERE
1.0000 mL | INTRAVENOUS | Status: AC | PRN
  Administered 2014-02-25: 2 mL via INTRAVENOUS
  Filled 2014-02-25: qty 10

## 2014-02-25 MED ORDER — DEXTROSE 5 % IV SOLN
20.0000 mmol | Freq: Once | INTRAVENOUS | Status: AC
  Administered 2014-02-25: 20 mmol via INTRAVENOUS
  Filled 2014-02-25: qty 6.67

## 2014-02-25 MED ORDER — SODIUM CHLORIDE 0.9 % IV SOLN
Freq: Once | INTRAVENOUS | Status: AC
  Administered 2014-02-25: 08:00:00 via INTRAVENOUS

## 2014-02-25 MED ORDER — MIDODRINE HCL 5 MG PO TABS
10.0000 mg | ORAL_TABLET | Freq: Three times a day (TID) | ORAL | Status: DC
  Administered 2014-02-25 – 2014-03-06 (×30): 10 mg via ORAL
  Filled 2014-02-25 (×32): qty 2

## 2014-02-25 MED ORDER — AMIODARONE HCL IN DEXTROSE 360-4.14 MG/200ML-% IV SOLN
30.0000 mg/h | INTRAVENOUS | Status: DC
  Administered 2014-02-25 – 2014-03-06 (×18): 30 mg/h via INTRAVENOUS
  Filled 2014-02-25 (×41): qty 200

## 2014-02-25 MED ORDER — INSULIN GLARGINE 100 UNIT/ML ~~LOC~~ SOLN
15.0000 [IU] | Freq: Two times a day (BID) | SUBCUTANEOUS | Status: DC
  Administered 2014-02-26 – 2014-02-27 (×2): 15 [IU] via SUBCUTANEOUS
  Filled 2014-02-25 (×5): qty 0.15

## 2014-02-25 MED ORDER — HEPARIN (PORCINE) IN NACL 100-0.45 UNIT/ML-% IJ SOLN
1400.0000 [IU]/h | INTRAMUSCULAR | Status: DC
  Administered 2014-02-25: 1400 [IU]/h via INTRAVENOUS
  Filled 2014-02-25 (×2): qty 250

## 2014-02-25 MED ORDER — AMIODARONE HCL IN DEXTROSE 360-4.14 MG/200ML-% IV SOLN
60.0000 mg/h | INTRAVENOUS | Status: AC
  Administered 2014-02-25 (×2): 60 mg/h via INTRAVENOUS
  Filled 2014-02-25: qty 200

## 2014-02-25 MED ORDER — POTASSIUM CHLORIDE 10 MEQ/50ML IV SOLN
10.0000 meq | Freq: Once | INTRAVENOUS | Status: AC
  Administered 2014-02-25: 10 meq via INTRAVENOUS

## 2014-02-25 NOTE — Progress Notes (Addendum)
Advanced Heart Failure Rounding Note   Subjective:    [name] is a 73 yo female with a history of CAD s/p PCI at [facility], HTN, TIA, DM2 and PAD. She [facility]25/14 with CP,Vtach and ST changes in inferior leads and was taken to the cath lab. She had severe multivessel CAD involving 100% distal RCA with tandem 90% & 80% lesions, proximal D1 100%, proximal LAD & AVGroove Circ 90% with 80% OM2 and was evaluated by Dr. Maren BeachVanTrigt for CABG and IABP was placed.  Underwent emergent CABGx4 and MV replacement (LIMA-LAD, SVG to OM1, SVG to OM2 and SVG to PDA) on 01/26/14. She was transferred to the ICU on multiple pressors and IABP. UOP was sluggish and remained volume overloaded and lasix gtt was started. IABP removed 7/17. TF started for nutrition. Had difficulty with thrombocytopenia which improved and HIT studies negative and Coumadin was restarted. Underwent thoracentesis on R on 02/06/14. Extubated 7/25 but re-intubated 7/27 electively for therapeutic bronchoscopy showing no significant mucus, airways clear w/ exception of what appeared to be suction trauma at the inlet of the RUL. Underwent L CT by IR on 7/27. Her Creatinine was stable for awhile and then started trending up even on milrinone. Underwent HD cath placement 7/28 and started on CVVHD on 7/29. Extubated again 02/13/14. Has had issues with Afib/Aflutter.   ECHO 02/17/14. Reviewed personally EF 35-40% Inferior wall out. RV moderately HK with TAPSE 1.18cm  Remains on CVVHD.   Levophed being weaned. Now down to 9. Unfortunately co-ox down to 39%. BP soft. Still on milrinone. Given more albumin last nigh.CVP 12. Remains weak but alert. Amio restarted for AFL with RVR  Objective:   Weight Range:  Vital Signs:   Temp:  [97.1 F (36.2 C)-98.6 F (37 C)] 97.4 F (36.3 C) (08/12 0400) Pulse Rate:  [64-126] 104 (08/12 0500) Resp:  [16-40] 24 (08/12 0500) BP: (76-127)/(16-107) 85/51 mmHg (08/12 0500) SpO2:  [95 %-100 %] 100 % (08/12  0500) Weight:  [83.2 kg (183 lb 6.8 oz)] 83.2 kg (183 lb 6.8 oz) (08/11 0515) Last BM Date: 02/24/14  Weight change: Filed Weights   02/23/14 0000 02/23/14 0336 02/24/14 0515  Weight: 83.4 kg (183 lb 13.8 oz) 83.4 kg (183 lb 13.8 oz) 83.2 kg (183 lb 6.8 oz)    Intake/Output:   Intake/Output Summary (Last 24 hours) at 02/25/14 0511 Last data filed at 02/25/14 0500  Gross per 24 hour  Intake 2486.5 ml  Output   2895 ml  Net -408.5 ml     Physical Exam: CVP 12 with prominent CV waves General:  Chronically ill appearing. No resp difficulty HEENT: normal; Panda intact; L IJ HD cath Neck: supple. JVP 12 Carotids 2+ bilat; no bruits. No lymphadenopathy or thryomegaly appreciated. Cor: PMI nondisplaced. Regular rate & irregular rhythm. No rubs, gallops or murmurs. Lungs: Diminished in the bases Abdomen: soft, nontender, nondistended. No hepatosplenomegaly. No bruits or masses. Good bowel sounds. Extremities: no cyanosis, clubbing, rash, no edema; R 3L PICC Neuro: alert. Working with [employer] all 4 extremities w/o difficulty. Affect flat   Telemetry: Afib/flutter 90-120s  Labs: Basic Metabolic Panel:  Recent Labs Lab 02/20/14 0400  02/21/14 0327  02/22/14 0345 02/22/14 1556 02/23/14 0330 02/23/14 1500 02/24/14 0400 02/24/14 1500  NA 137  < > 134*  < > 137 133* 136* 136* 135* 143  K 4.5  < > 4.3  < > 3.9 4.0 4.0 4.1 4.4 3.0*  CL 97  < > 95*  < >  98 95* 96 98 97 116*  CO2 25  < > 23  < > 25 24 25 25 25  17*  GLUCOSE 156*  < > 243*  < > 128* 160* 124* 186* 171* 132*  BUN 28*  < > 30*  < > 26* 26* 26* 24* 21 16  CREATININE 1.22*  < > 1.08  < > 1.13* 1.10 1.11* 1.03 1.02 0.68  CALCIUM 9.3  < > 8.7  < > 8.9 8.9 8.8 8.8 9.0 5.7*  MG 2.7*  --  2.5  --  2.5  --  2.6*  --  2.6*  --   PHOS 2.6  < > 2.8  < > 2.2* 2.7 2.5 2.4 2.3 1.4*  < > = values in this interval not displayed.  Liver Function Tests:  Recent Labs Lab 02/19/14 0416  02/22/14 0345 02/22/14 1556 02/23/14 1500  02/24/14 0400 02/24/14 1500  AST 36  --   --   --   --  30  --   ALT 23  --   --   --   --  21  --   ALKPHOS 107  --   --   --   --  91  --   BILITOT 0.8  --   --   --   --  1.0  --   PROT 8.3  --   --   --   --  8.7*  --   ALBUMIN 2.9*  < > 2.9* 3.0* 3.3* 3.3* 2.2*  < > = values in this interval not displayed. No results found for this basename: LIPASE, AMYLASE,  in the last 168 hours No results found for this basename: AMMONIA,  in the last 168 hours  CBC:  Recent Labs Lab 02/20/14 0400 02/21/14 0327 02/22/14 0345 02/23/14 0330 02/24/14 0400  WBC 9.6 11.9* 11.8* 12.9* 10.0  HGB 9.9* 10.1* 9.9* 9.7* 9.1*  HCT 31.9* 33.5* 32.3* 30.9* 29.5*  MCV 96.4 99.1 98.8 96.0 98.7  PLT 185 129* 120* 152 159    Cardiac Enzymes: No results found for this basename: CKTOTAL, CKMB, CKMBINDEX, TROPONINI,  in the last 168 hours  BNP: BNP (last 3 results) No results found for this basename: PROBNP,  in the last 8760 hours   Other results:  EKG: AF  Imaging: Dg Abd 1 View  02/23/2014   CLINICAL DATA:  Panda placement  EXAM: ABDOMEN - 1 VIEW  COMPARISON:  None.  FINDINGS: Interval placement of a feeding tube with the metallic tip projecting over the fourth portion of the duodenum. The bowel gas pattern is normal. No radio-opaque calculi or other significant radiographic abnormality are seen.  IMPRESSION: Interval placement of a feeding tube with the metallic portion projecting over the fourth portion of the duodenum.   Electronically Signed   By: Elige Ko   On: 02/23/2014 12:07   Dg Chest Port 1 View  02/23/2014   CLINICAL DATA:  Postop evaluation.  EXAM: PORTABLE CHEST - 1 VIEW  COMPARISON:  02/21/2014  FINDINGS: There is a left jugular dialysis catheter with the tip in the upper SVC region. Feeding tube extends into the upper abdomen. Hazy densities at the left lung base are suggestive for atelectasis and a small amount of pleural fluid. Patient is status post cardiac surgery. There is  no evidence for a large pneumothorax. Heart size is stable. PICC line tip extends into the lower SVC region.  IMPRESSION: Left basilar densities are suggestive for atelectasis and  cannot exclude a small pleural effusion.  Support apparatuses appear to be stable.   Electronically Signed   By: Richarda Overlie M.D.   On: 02/23/2014 07:40     Medications:     Scheduled Medications: . aspirin  324 mg Per Tube Daily  . bisacodyl  10 mg Oral Daily   Or  . bisacodyl  10 mg Rectal Daily  . chlorhexidine  15 mL Mouth Rinse BID  . feeding supplement (NEPRO CARB STEADY)  1,000 mL Per Tube Q24H  . feeding supplement (PRO-STAT SUGAR FREE 64)  30 mL Per Tube QID  . Gerhardt's butt cream   Topical TID  . insulin aspart  0-24 Units Subcutaneous 6 times per day  . insulin glargine  25 Units Subcutaneous BID  . levalbuterol  0.63 mg Nebulization Q6H  . levothyroxine  75 mcg Per Tube QAC breakfast  . midodrine  5 mg Oral BID WC  . pantoprazole sodium  40 mg Per Tube Q1200  . potassium & sodium phosphates  1 packet Per Tube TID  . sodium chloride  10-40 mL Intracatheter Q12H    Infusions: . sodium chloride Stopped (02/20/14 0700)  . sodium chloride Stopped (02/20/14 1830)  . amiodarone 60 mg/hr (02/25/14 0110)  . amiodarone    . heparin 1,500 Units/hr (02/25/14 0400)  . milrinone 0.125 mcg/kg/min (02/25/14 0400)  . norepinephrine (LEVOPHED) Adult infusion 9 mcg/min (02/25/14 0400)  . dialysis replacement fluid (prismasate) 300 mL/hr at 02/24/14 2100  . dialysis replacement fluid (prismasate) 200 mL/hr at 02/24/14 1748  . dialysate (PRISMASATE) 2,000 mL/hr at 02/25/14 0310    PRN Medications: acetaminophen, heparin, heparin, ondansetron (ZOFRAN) IV, oxyCODONE, sodium chloride, sodium chloride, traMADol   Assessment:   1) CAD - s/p CABG x 4 with MV replacement 2) A/C respiratory failure 3) AKI - On CVVHD 4) Cardiogenic shock 5) Afib/A Flutter 6) L Pleural effusion - s/p L CT 7) Anemia 8)  Delerium, acute 9) ? 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 Shirlee Latch as well. She has had a large inferior MI with RV involvement. Although RV only looks moderately down on echo TAPSE is very low c/w severe RV failure and is likely the source of her ongoing cardiogenic shock.   BP very soft and co-ox markedly reduced with levophed wean despite milrinone. Suspect profound cardiogenic shock due to RV failure. Will repeat echo. Will d/w Dr. Donata Clay regarding his thought re DC-CV to try to improve cardiac output in any way possible.   I am concerned prognosis is quite poor.   Daniel Bensimhon,MD 5:11 AM

## 2014-02-25 NOTE — Progress Notes (Addendum)
31 Days Post-Op Procedure(s) (LRB): CORONARY ARTERY BYPASS GRAFTING TIMES FOUR USING LEFT INTERNAL MAMMARY ARTERY TO LAD, SAPHENOUS VEIN GRAFTS TO OM1, OM2, AND PDA (N/A) MITRAL VALVE (MV) REPLACEMENT (N/A) Subjective: Back in rapid jx rhythm last night- back on iv amio CO-Ox lower today Will need DCCV by cards- echo pending today Cont iv heparin and amio Objective: Vital signs in last 24 hours: Temp:  [97.4 F (36.3 C)-98.6 F (37 C)] 98.3 F (36.8 C) (08/12 0815) Pulse Rate:  [64-126] 102 (08/12 0830) Cardiac Rhythm:  [-] Atrial flutter (08/12 0800) Resp:  [16-26] 21 (08/12 0830) BP: (76-127)/(16-107) 88/31 mmHg (08/12 0830) SpO2:  [95 %-100 %] 98 % (08/12 0830) Weight:  [180 lb 3.2 oz (81.738 kg)] 180 lb 3.2 oz (81.738 kg) (08/12 0500)  Hemodynamic parameters for last 24 hours: CVP:  [3 mmHg-15 mmHg] 9 mmHg  Intake/Output from previous day: 08/11 0701 - 08/12 0700 In: 3420 [I.V.:1570; NG/GT:1300; IV Piggyback:550] Out: 3080 [Stool:700] Intake/Output this shift: Total I/O In: 83.2 [I.V.:43.2; NG/GT:40] Out: 100 [Other:100]  Skin dry Responds but min verbalization  Lab Results:  Recent Labs  02/24/14 0400 02/25/14 0433 02/25/14 0827  WBC 10.0 10.3  --   HGB 9.1* 8.9* 10.9*  HCT 29.5* 29.3* 32.0*  PLT 159 148*  --    BMET:  Recent Labs  02/24/14 1500 02/25/14 0433 02/25/14 0827  NA 143 135* 134*  K 3.0* 4.9 4.7  CL 116* 96 98  CO2 17* 25  --   GLUCOSE 132* 198* 250*  BUN 16 24* 23  CREATININE 0.68 1.00 1.00  CALCIUM 5.7* 9.2  --     PT/INR: No results found for this basename: LABPROT, INR,  in the last 72 hours ABG    Component Value Date/Time   PHART 7.463* 02/17/2014 1013   HCO3 26.6* 02/17/2014 1013   TCO2 23 02/25/2014 0827   ACIDBASEDEF 0.1 01/30/2014 0339   O2SAT 46.5 02/25/2014 0825   CBG (last 3)   Recent Labs  02/24/14 2325 02/25/14 0350 02/25/14 0811  GLUCAP 180* 185* 172*    Assessment/Plan: S/P Procedure(s) (LRB): CORONARY  ARTERY BYPASS GRAFTING TIMES FOUR USING LEFT INTERNAL MAMMARY ARTERY TO LAD, SAPHENOUS VEIN GRAFTS TO OM1, OM2, AND PDA (N/A) MITRAL VALVE (MV) REPLACEMENT (N/A) DCCV per cards Stop CVVH soon- patient appears to have met goals of fluid removal, BUN Creat normal- will need transition to HD and tx to LTAC   LOS: 31 days    VAN TRIGT III,PETER 02/25/2014

## 2014-02-25 NOTE — Progress Notes (Signed)
Patient ID: [name], female   DOB: July 05, 1941, 73 y.o.   MRN: [id number] EVENING ROUNDS NOTE :     [address]       [place] [address]             [phone]                 31 Days Post-Op Procedure(s) (LRB): CORONARY ARTERY BYPASS GRAFTING TIMES FOUR USING LEFT INTERNAL MAMMARY ARTERY TO LAD, SAPHENOUS VEIN GRAFTS TO OM1, OM2, AND PDA (N/A) MITRAL VALVE (MV) REPLACEMENT (N/A)  Total Length of Stay:  LOS: 31 days  BP 103/52  Pulse 96  Temp(Src) 96.6 F (35.9 C) (Axillary)  Resp 20  Ht 5\' 7"  (1.702 m)  Wt 180 lb 3.2 oz (81.738 kg)  BMI 28.22 kg/m2  SpO2 97%  .Intake/Output     08/12 0701 - 08/13 0700   I.V. (mL/kg) 1236.2 (15.1)   NG/GT 510   IV Piggyback 258   Total Intake(mL/kg) 2004.2 (24.5)   Other 1553   Stool    Total Output 1553   Net +451.2         . sodium chloride Stopped (02/20/14 0700)  . sodium chloride Stopped (02/20/14 1830)  . sodium chloride 50 mL/hr (02/25/14 1008)  . amiodarone 30 mg/hr (02/25/14 1900)  . heparin 1,400 Units/hr (02/25/14 1900)  . milrinone 0.25 mcg/kg/min (02/25/14 1900)  . norepinephrine (LEVOPHED) Adult infusion 9 mcg/min (02/25/14 1900)  . dialysis replacement fluid (prismasate) 300 mL/hr at 02/25/14 1240  . dialysis replacement fluid (prismasate) 200 mL/hr at 02/24/14 1748  . dialysate (PRISMASATE) 2,000 mL/hr at 02/25/14 1845     Lab Results  Component Value Date   WBC 10.3 02/25/2014   HGB 10.9* 02/25/2014   HCT 32.0* 02/25/2014   PLT 148* 02/25/2014   GLUCOSE 172* 02/25/2014   CHOL 55 02/06/2014   TRIG 115 01/26/2014   HDL 20* 01/26/2014   LDLCALC 39 01/26/2014   ALT 21 02/24/2014   AST 30 02/24/2014   NA 135* 02/25/2014   K 4.5 02/25/2014   CL 96 02/25/2014   CREATININE 0.92 02/25/2014   BUN 23 02/25/2014   CO2 25 02/25/2014   TSH 6.630* 02/11/2014   INR 1.73* 01/26/2014   HGBA1C 8.4* 01/26/2014   No change today, panda back in after being pulled out  Delight Ovens MD  Beeper  [phone] Office [phone] 02/25/2014 7:40 PM

## 2014-02-25 NOTE — Progress Notes (Signed)
ANTICOAGULATION CONSULT NOTE - Follow Up Consult  Pharmacy Consult for Heparin  Indication: atrial fibrillation  No Known Allergies  Patient Measurements: Height: 5\' 7"  (170.2 cm) Weight: 180 lb 3.2 oz (81.738 kg) IBW/kg (Calculated) : 61.6  Vital Signs: Temp: 98.3 F (36.8 C) (08/12 0815) Temp src: Axillary (08/12 0815) BP: 88/31 mmHg (08/12 0830) Pulse Rate: 102 (08/12 0830)  Labs:  Recent Labs  02/23/14 0330  02/24/14 0400 02/24/14 1500 02/25/14 0433 02/25/14 0827  HGB 9.7*  --  9.1*  --  8.9* 10.9*  HCT 30.9*  --  29.5*  --  29.3* 32.0*  PLT 152  --  159  --  148*  --   HEPARINUNFRC 0.31  --  0.50  --  0.57  --   CREATININE 1.11*  < > 1.02 0.68 1.00 1.00  < > = values in this interval not displayed.  Estimated Creatinine Clearance: 54.2 ml/min (by C-G formula based on Cr of 1).   Assessment: 73 y/o F on CRRT for acute renal failure post CABG.  She is on a heparin drip for prosthetic MVR and afib at 1500 units/hr, levels have been very labile over the past couple of days but currently in middle of goal range at 0.57. H/H is low but has trended up and platelets are slowly trending down to 148, no bleeding noted. HIT negative on 7/15. Other labs as above.   Goal of Therapy:  Heparin level 0.3-0.7 units/ml Monitor platelets by anticoagulation protocol: Yes   Plan:  - Heparin decreased to 1400 u/hr per Dr. Donata Clay - Daily HL, CBC - Monitor platelets closely and s/s bleeding  Margie Billet, PharmD Clinical Pharmacist - Resident Pager: [phone] Pharmacy: [phone] 02/25/2014 9:02 AM

## 2014-02-25 NOTE — Progress Notes (Signed)
Spoke with Dr. Donata Clay regarding rhythm change. She is fluctuating between a-flutter with a rate 90s-110s, and accelerated junctional (confirmed with EKG) with a rate in the upper 120s. SBP 90s-110s. Orders received to restart Amiodarone drip and to give IV potassium. Will continue to closely monitor. Thresa Ross RN

## 2014-02-25 NOTE — Progress Notes (Signed)
  Echocardiogram 2D Echocardiogram with Definity has been performed.  [name] 02/25/2014, 9:41 AM

## 2014-02-25 NOTE — Progress Notes (Signed)
Subjective: Interval History: has no complaint, but just shakes head.  Objective: Vital signs in last 24 hours: Temp:  [97.1 F (36.2 C)-98.6 F (37 C)] 97.4 F (36.3 C) (08/12 0400) Pulse Rate:  [64-126] 105 (08/12 0715) Resp:  [16-40] 22 (08/12 0715) BP: (76-127)/(16-107) 101/53 mmHg (08/12 0700) SpO2:  [95 %-100 %] 97 % (08/12 0715) Weight:  [81.738 kg (180 lb 3.2 oz)] 81.738 kg (180 lb 3.2 oz) (08/12 0500) Weight change: -1.462 kg (-3 lb 3.6 oz)  Intake/Output from previous day: 08/11 0701 - 08/12 0700 In: 3420 [I.V.:1570; NG/GT:1300; IV Piggyback:550] Out: 3080 [Stool:700] Intake/Output this shift:    General appearance: alert, pale and slowed mentation Neck: LIJ cath Resp: rales LLL Cardio: irregularly irregular rhythm and systolic murmur: holosystolic 2/6, blowing at apex GI: pos bs,soft, liver down 4 cm Extremities: edema 2+  Lab Results:  Recent Labs  02/24/14 0400 02/25/14 0433  WBC 10.0 10.3  HGB 9.1* 8.9*  HCT 29.5* 29.3*  PLT 159 148*   BMET:  Recent Labs  02/24/14 1500 02/25/14 0433  NA 143 135*  K 3.0* 4.9  CL 116* 96  CO2 17* 25  GLUCOSE 132* 198*  BUN 16 24*  CREATININE 0.68 1.00  CALCIUM 5.7* 9.2   No results found for this basename: PTH,  in the last 72 hours Iron Studies: No results found for this basename: IRON, TIBC, TRANSFERRIN, FERRITIN,  in the last 72 hours  Studies/Results: Dg Abd 1 View  02/23/2014   CLINICAL DATA:  Panda placement  EXAM: ABDOMEN - 1 VIEW  COMPARISON:  None.  FINDINGS: Interval placement of a feeding tube with the metallic tip projecting over the fourth portion of the duodenum. The bowel gas pattern is normal. No radio-opaque calculi or other significant radiographic abnormality are seen.  IMPRESSION: Interval placement of a feeding tube with the metallic portion projecting over the fourth portion of the duodenum.   Electronically Signed   By: Elige Ko   On: 02/23/2014 12:07    I have reviewed the  patient's current medications.  Assessment/Plan: 1 AKI oliguric ATN.Peri Jefferson solute clearance, acid/base/k.  Phos low replete. Will try to give a little more vol today 2 CM/CABG/MVR  RV dysfunction , low bp, ^ Mido, give vol 3 Anemia trending down 4 DM controlle 5 Nutrition TF 6 PVD P CRRT, vol , ^ mido, support. , Phos    LOS: 31 days   Laticha Ferrucci L 02/25/2014,7:42 AM

## 2014-02-25 NOTE — Progress Notes (Signed)
Pt pulled panda out . Dr Donata Clay informed and new panda inserted per protocol.Portable KUB ordered after co2 detector negative

## 2014-02-26 ENCOUNTER — Inpatient Hospital Stay (HOSPITAL_COMMUNITY): Payer: Medicare Other

## 2014-02-26 ENCOUNTER — Inpatient Hospital Stay (HOSPITAL_COMMUNITY): Payer: Medicare Other | Admitting: Certified Registered Nurse Anesthetist

## 2014-02-26 ENCOUNTER — Encounter (HOSPITAL_COMMUNITY): Payer: Medicare Other | Admitting: Certified Registered Nurse Anesthetist

## 2014-02-26 LAB — RENAL FUNCTION PANEL
ALBUMIN: 3.2 g/dL — AB (ref 3.5–5.2)
ANION GAP: 12 (ref 5–15)
ANION GAP: 15 (ref 5–15)
Albumin: 3.5 g/dL (ref 3.5–5.2)
BUN: 23 mg/dL (ref 6–23)
BUN: 19 mg/dL (ref 6–23)
CHLORIDE: 97 meq/L (ref 96–112)
CO2: 26 meq/L (ref 19–32)
CO2: 25 mEq/L (ref 19–32)
Calcium: 8.9 mg/dL (ref 8.4–10.5)
Calcium: 9 mg/dL (ref 8.4–10.5)
Chloride: 97 mEq/L (ref 96–112)
Creatinine, Ser: 0.95 mg/dL (ref 0.50–1.10)
Creatinine, Ser: 1.13 mg/dL — ABNORMAL HIGH (ref 0.50–1.10)
GFR calc non Af Amer: 58 mL/min — ABNORMAL LOW (ref >90)
GFR calc non Af Amer: 47 mL/min — ABNORMAL LOW (ref >90)
GFR, EST AFRICAN AMERICAN: 67 mL/min — AB (ref >90)
GFR, EST AFRICAN AMERICAN: 54 mL/min — AB (ref >90)
Glucose, Bld: 116 mg/dL — ABNORMAL HIGH (ref 70–99)
Glucose, Bld: 130 mg/dL — ABNORMAL HIGH (ref 70–99)
POTASSIUM: 4.4 meq/L (ref 3.7–5.3)
Phosphorus: 2.7 mg/dL (ref 2.3–4.6)
Phosphorus: 2.3 mg/dL (ref 2.3–4.6)
Potassium: 4.7 mEq/L (ref 3.7–5.3)
SODIUM: 135 meq/L — AB (ref 137–147)
Sodium: 137 mEq/L (ref 137–147)

## 2014-02-26 LAB — COMPREHENSIVE METABOLIC PANEL
ALT: 17 U/L (ref 0–35)
AST: 29 U/L (ref 0–37)
Albumin: 3.4 g/dL — ABNORMAL LOW (ref 3.5–5.2)
Alkaline Phosphatase: 89 U/L (ref 39–117)
Anion gap: 12 (ref 5–15)
BUN: 23 mg/dL (ref 6–23)
CO2: 26 mEq/L (ref 19–32)
CREATININE: 0.95 mg/dL (ref 0.50–1.10)
Calcium: 9 mg/dL (ref 8.4–10.5)
Chloride: 98 mEq/L (ref 96–112)
GFR calc Af Amer: 67 mL/min — ABNORMAL LOW (ref >90)
GFR, EST NON AFRICAN AMERICAN: 58 mL/min — AB (ref >90)
Glucose, Bld: 117 mg/dL — ABNORMAL HIGH (ref 70–99)
Potassium: 4.8 mEq/L (ref 3.7–5.3)
Sodium: 136 mEq/L — ABNORMAL LOW (ref 137–147)
Total Bilirubin: 1.2 mg/dL (ref 0.3–1.2)
Total Protein: 8.5 g/dL — ABNORMAL HIGH (ref 6.0–8.3)

## 2014-02-26 LAB — CBC
HCT: 27.8 % — ABNORMAL LOW (ref 36.0–46.0)
Hemoglobin: 8.4 g/dL — ABNORMAL LOW (ref 12.0–15.0)
MCH: 29.6 pg (ref 26.0–34.0)
MCHC: 30.2 g/dL (ref 30.0–36.0)
MCV: 97.9 fL (ref 78.0–100.0)
Platelets: 200 10*3/uL (ref 150–400)
RBC: 2.84 MIL/uL — ABNORMAL LOW (ref 3.87–5.11)
RDW: 18.8 % — ABNORMAL HIGH (ref 11.5–15.5)
WBC: 12.9 10*3/uL — ABNORMAL HIGH (ref 4.0–10.5)

## 2014-02-26 LAB — GLUCOSE, CAPILLARY
Glucose-Capillary: 117 mg/dL — ABNORMAL HIGH (ref 70–99)
Glucose-Capillary: 148 mg/dL — ABNORMAL HIGH (ref 70–99)
Glucose-Capillary: 142 mg/dL — ABNORMAL HIGH (ref 70–99)
Glucose-Capillary: 174 mg/dL — ABNORMAL HIGH (ref 70–99)
Glucose-Capillary: 215 mg/dL — ABNORMAL HIGH (ref 70–99)

## 2014-02-26 LAB — CARBOXYHEMOGLOBIN
Carboxyhemoglobin: 2.1 % — ABNORMAL HIGH (ref 0.5–1.5)
Methemoglobin: 0.9 % (ref 0.0–1.5)
O2 Saturation: 42.4 %
Total hemoglobin: 8.2 g/dL — ABNORMAL LOW (ref 12.0–16.0)

## 2014-02-26 LAB — HEPARIN LEVEL (UNFRACTIONATED)
Heparin Unfractionated: <0.1 IU/mL — ABNORMAL LOW (ref 0.30–0.70)
Heparin Unfractionated: 0.25 IU/mL — ABNORMAL LOW (ref 0.30–0.70)

## 2014-02-26 LAB — MAGNESIUM: Magnesium: 2.5 mg/dL (ref 1.5–2.5)

## 2014-02-26 LAB — PHOSPHORUS: Phosphorus: 2.6 mg/dL (ref 2.3–4.6)

## 2014-02-26 MED ORDER — AMIODARONE IV BOLUS ONLY 150 MG/100ML
150.0000 mg | Freq: Once | INTRAVENOUS | Status: AC
  Administered 2014-02-26: 150 mg via INTRAVENOUS

## 2014-02-26 MED ORDER — NEPRO/CARBSTEADY PO LIQD
1000.0000 mL | ORAL | Status: DC
  Administered 2014-02-26: 40 mL/h via ORAL
  Administered 2014-02-27: 1000 mL via ORAL
  Filled 2014-02-26 (×6): qty 1000

## 2014-02-26 MED ORDER — HEPARIN (PORCINE) IN NACL 100-0.45 UNIT/ML-% IJ SOLN
1500.0000 [IU]/h | INTRAMUSCULAR | Status: DC
  Administered 2014-02-26: 1500 [IU]/h via INTRAVENOUS
  Filled 2014-02-26 (×2): qty 250

## 2014-02-26 MED ORDER — HEPARIN (PORCINE) IN NACL 100-0.45 UNIT/ML-% IJ SOLN
1750.0000 [IU]/h | INTRAMUSCULAR | Status: DC
  Administered 2014-02-27 – 2014-02-28 (×3): 1600 [IU]/h via INTRAVENOUS
  Administered 2014-03-01 – 2014-03-02 (×3): 1800 [IU]/h via INTRAVENOUS
  Filled 2014-02-26 (×8): qty 250

## 2014-02-26 MED ORDER — PROPOFOL 10 MG/ML IV BOLUS
INTRAVENOUS | Status: DC | PRN
  Administered 2014-02-26: 60 mg via INTRAVENOUS

## 2014-02-26 MED ORDER — SODIUM CHLORIDE 0.9 % IV SOLN
INTRAVENOUS | Status: DC | PRN
  Administered 2014-02-26: 18:00:00 via INTRAVENOUS

## 2014-02-26 MED ORDER — NEPRO/CARBSTEADY PO LIQD: 237.0000 mL | ORAL | Status: DC

## 2014-02-26 NOTE — Consult Note (Signed)
WOC wound consult note Reason for Consult: evaluation of gluteal skin breakdown. Pt now with flexiseal in place since 02/10/14.  Bedside nurse reports a reddened  irritated area on the lower gluteal region.  Wound type: upon my assessment today the area does not appear red or irritated any longer.  Bedside nursing is using Gerhardt's barrier cream on the affected area and this seems to improved the area of concern.    Discussed POC with patient and bedside nurse.  Re consult if needed, will not follow at this time. Thanks  Bernestine Holsapple [employer], CWOCN [phone])

## 2014-02-26 NOTE — Transfer of Care (Addendum)
Immediate Anesthesia Transfer of Care Note  Patient: [name]  Procedure(s) Performed: * No procedures listed *  Patient Location: SICU  Anesthesia Type:MAC  Level of Consciousness: sedated  Airway & Oxygen Therapy: Patient Spontanous Breathing and Patient connected to nasal cannula oxygen  Post-op Assessment: Report given to PACU RN and Post -op Vital signs reviewed and stable  Post vital signs: Reviewed and stable  Complications: No apparent anesthesia complications

## 2014-02-26 NOTE — Progress Notes (Signed)
  Reverted back to AF soon after DC-CV. Will continue to load amio and repeat DC-CV in 48 hours.  [name] 6:46 PM

## 2014-02-26 NOTE — Anesthesia Preprocedure Evaluation (Addendum)
Anesthesia Evaluation  Patient identified by MRN, date of birth, ID band Patient awake and Patient confused    Reviewed: Allergy & Precautions  Airway Mallampati: I TM Distance: >3 FB Neck ROM: Full    Dental  (+) Teeth Intact, Dental Advisory Given   Pulmonary          Cardiovascular hypertension, + CAD, + Past MI and + Peripheral Vascular Disease + dysrhythmias Atrial Fibrillation  S/p CABG and MVR 01/25/14, EF 02/25/14 40-45%   Neuro/Psych Anxiety TIA   GI/Hepatic   Endo/Other  diabetes, Type 2CBG 130  Renal/GU Renal disease     Musculoskeletal   Abdominal   Peds  Hematology   Anesthesia Other Findings   Reproductive/Obstetrics                        Anesthesia Physical Anesthesia Plan  ASA: III  Anesthesia Plan: General   Post-op Pain Management:    Induction: Intravenous  Airway Management Planned: Mask  Additional Equipment:   Intra-op Plan:   Post-operative Plan:   Informed Consent: I have reviewed the patients History and Physical, chart, labs and discussed the procedure including the risks, benefits and alternatives for the proposed anesthesia with the patient or authorized representative who has indicated his/her understanding and acceptance.   Dental advisory given  Plan Discussed with: CRNA and Surgeon  Anesthesia Plan Comments:        Anesthesia Quick Evaluation

## 2014-02-26 NOTE — Progress Notes (Signed)
32 Days Post-Op Procedure(s) (LRB): CORONARY ARTERY BYPASS GRAFTING TIMES FOUR USING LEFT INTERNAL MAMMARY ARTERY TO LAD, SAPHENOUS VEIN GRAFTS TO OM1, OM2, AND PDA (N/A) MITRAL VALVE (MV) REPLACEMENT (N/A) Subjective: Patient remains in atrial flutter, blood pressure stable on moderate dose norepinephrine and low-dose milrinone 2-D echocardiogram performed yesterday shows no significant change-EF 40-45%, mild RV dilatation, mild TR CVP 8-10 cm H2O, CVVH to be stopped in the near future with transition to HD Cardioversion for persistent rapid atrial flutter per cardiology hopefully will improve mixed venous saturation Patient remains awake and responsive, she appears comfortable Plan resuming tube feeds with Nepro once cardioversion completed Continue IV heparin for cardioversion for atrial arrhythmias with conversion to Coumadin later Hopefully with discontinuation of CVVH patient can be mobilized out of bed to chair and start physical therapy Objective: Vital signs in last 24 hours: Temp:  [96.6 F (35.9 C)-98.4 F (36.9 C)] 97.8 F (36.6 C) (08/13 0800) Pulse Rate:  [93-123] 114 (08/13 0800) Cardiac Rhythm:  [-] Atrial flutter (08/13 0800) Resp:  [16-31] 21 (08/13 0800) BP: (74-128)/(33-94) 101/54 mmHg (08/13 0800) SpO2:  [95 %-100 %] 100 % (08/13 0800) Weight:  [183 lb 3.2 oz (83.1 kg)] 183 lb 3.2 oz (83.1 kg) (08/13 0500)  Hemodynamic parameters for last 24 hours: CVP:  [5 mmHg-48 mmHg] 11 mmHg  Intake/Output from previous day: 08/12 0701 - 08/13 0700 In: 3489 [I.V.:2381; NG/GT:850; IV Piggyback:258] Out: 3104  Intake/Output this shift: Total I/O In: 145.5 [I.V.:95.5; NG/GT:50] Out: 100 [Other:100]  Awake and responsive Breath sounds clear Extremities warm Abdomen negative, feeding tube in pre-pyloric distal antrum Surgical incisions clean and dry  Lab Results:  Recent Labs  02/25/14 0433 02/25/14 0827 02/26/14 0410  WBC 10.3  --  12.9*  HGB 8.9* 10.9* 8.4*   HCT 29.3* 32.0* 27.8*  PLT 148*  --  200   BMET:  Recent Labs  02/25/14 1600 02/26/14 0410  NA 135* 135*  136*  K 4.5 4.7  4.8  CL 96 97  98  CO2 25 26  26   GLUCOSE 172* 116*  117*  BUN 23 23  23   CREATININE 0.92 0.95  0.95  CALCIUM 8.8 8.9  9.0    PT/INR: No results found for this basename: LABPROT, INR,  in the last 72 hours ABG    Component Value Date/Time   PHART 7.463* 02/17/2014 1013   HCO3 26.6* 02/17/2014 1013   TCO2 23 02/25/2014 0827   ACIDBASEDEF 0.1 01/30/2014 0339   O2SAT 42.4 02/26/2014 0500   CBG (last 3)   Recent Labs  02/25/14 1932 02/25/14 2333 02/26/14 0333  GLUCAP 171* 213* 117*    Assessment/Plan: S/P Procedure(s) (LRB): CORONARY ARTERY BYPASS GRAFTING TIMES FOUR USING LEFT INTERNAL MAMMARY ARTERY TO LAD, SAPHENOUS VEIN GRAFTS TO OM1, OM2, AND PDA (N/A) MITRAL VALVE (MV) REPLACEMENT (N/A) Continue current care long-term plan for LTAC-consult placed   LOS: 32 days    VAN TRIGT III,Thad Osoria 02/26/2014

## 2014-02-26 NOTE — CV Procedure (Signed)
     DIRECT CURRENT CARDIOVERSION  NAME:  [name]   MRN: [id number] DOB:  07-01-1941   ADMIT DATE: 01/25/2014   INDICATIONS: Atrial fibrillation    PROCEDURE:   Informed consent was obtained prior to the procedure. Once an appropriate time out was taken, the patient had the defibrillator pads placed in the anterior and posterior position. The patient then underwent sedation by the anesthesia service. Once an appropriate level of sedation was achieved, the patient received a single biphasic, synchronized 150J shock with prompt conversion to sinus rhythm. No apparent complications.  [name] 6:38 PM

## 2014-02-26 NOTE — Progress Notes (Signed)
Advanced Heart Failure Rounding Note   Subjective:    [name] is a 73 yo female with a history of CAD s/p PCI at [facility], HTN, TIA, DM2 and PAD. She presented to [facility] 01/25/14 with CP,Vtach and ST changes in inferior leads and was taken to the cath lab. She had severe multivessel CAD involving 100% distal RCA with tandem 90% & 80% lesions, proximal D1 100%, proximal LAD & AVGroove Circ 90% with 80% OM2 and was evaluated by Dr. Maren Beach for CABG and IABP was placed.  Underwent emergent CABGx4 and MV replacement (LIMA-LAD, SVG to OM1, SVG to OM2 and SVG to PDA) on 01/26/14. She was transferred to the ICU on multiple pressors and IABP. UOP was sluggish and remained volume overloaded and lasix gtt was started. IABP removed 7/17. TF started for nutrition. Had difficulty with thrombocytopenia which improved and HIT studies negative and Coumadin was restarted. Underwent thoracentesis on R on 02/06/14. Extubated 7/25 but re-intubated 7/27 electively for therapeutic bronchoscopy showing no significant mucus, airways clear w/ exception of what appeared to be suction trauma at the inlet of the RUL. Underwent L CT by IR on 7/27. Her Creatinine was stable for awhile and then started trending up even on milrinone. Underwent HD cath placement 7/28 and started on CVVHD on 7/29. Extubated again 02/13/14. Has had issues with Afib/Aflutter.   ECHO 02/17/14. Reviewed personally EF 35-40% Inferior wall out. RV moderately HK with TAPSE 1.18cm  Remains on CVVHD.   Levophed being weaned. Now down to 8. Unfortunately co-ox remains 42%. SBP in 80s. Still on milrinone. Given more  Remains weak but alert. Amio restarted for AFL with RVR  Objective:   Weight Range:  Vital Signs:   Temp:  [96.6 F (35.9 C)-98.4 F (36.9 C)] 98.4 F (36.9 C) (08/13 1130) Pulse Rate:  [93-123] 112 (08/13 1330) Resp:  [12-31] 21 (08/13 1330) BP: (71-128)/(28-94) 98/52 mmHg (08/13 1322) SpO2:  [95 %-100 %] 96 % (08/13 1330) Weight:  [183 lb 3.2 oz  (83.1 kg)] 183 lb 3.2 oz (83.1 kg) (08/13 0500) Last BM Date: 02/24/14  Weight change: Filed Weights   02/24/14 0515 02/25/14 0500 02/26/14 0500  Weight: 183 lb 6.8 oz (83.2 kg) 180 lb 3.2 oz (81.738 kg) 183 lb 3.2 oz (83.1 kg)    Intake/Output:   Intake/Output Summary (Last 24 hours) at 02/26/14 1359 Last data filed at 02/26/14 1300  Gross per 24 hour  Intake 3118.7 ml  Output   3058 ml  Net   60.7 ml     Physical Exam: CVP 10 with prominent CV waves General:  Chronically ill appearing. No resp difficulty HEENT: normal; Panda intact; L IJ HD cath Neck: supple. JVP 10 Carotids 2+ bilat; no bruits. No lymphadenopathy or thryomegaly appreciated. Cor: PMI nondisplaced. Regular rate & irregular rhythm. No rubs, gallops or murmurs. Lungs: Diminished in the bases Abdomen: soft, nontender, nondistended. No hepatosplenomegaly. No bruits or masses. Good bowel sounds. Extremities: no cyanosis, clubbing, rash, no edema; R 3L PICC Neuro: alert. Working with [employer] all 4 extremities w/o difficulty. Affect flat   Telemetry: Afib/flutter 90-120s  Labs: Basic Metabolic Panel:  Recent Labs Lab 02/22/14 0345  02/23/14 0330  02/24/14 0400 02/24/14 1500 02/25/14 0433 02/25/14 0827 02/25/14 1600 02/26/14 0410  NA 137  < > 136*  < > 135* 143 135* 134* 135* 135*  136*  K 3.9  < > 4.0  < > 4.4 3.0* 4.9 4.7 4.5 4.7  4.8  CL 98  < >  96  < > 97 116* 96 98 96 97  98  CO2 25  < > 25  < > 25 17* 25  --  25 26  26   GLUCOSE 128*  < > 124*  < > 171* 132* 198* 250* 172* 116*  117*  BUN 26*  < > 26*  < > 21 16 24* 23 23 23  23   CREATININE 1.13*  < > 1.11*  < > 1.02 0.68 1.00 1.00 0.92 0.95  0.95  CALCIUM 8.9  < > 8.8  < > 9.0 5.7* 9.2  --  8.8 8.9  9.0  MG 2.5  --  2.6*  --  2.6*  --  2.7*  --   --  2.5  PHOS 2.2*  < > 2.5  < > 2.3 1.4* 2.1*  --  3.3 2.7  2.6  < > = values in this interval not displayed.  Liver Function Tests:  Recent Labs Lab 02/24/14 0400 02/24/14 1500  02/25/14 0433 02/25/14 1600 02/26/14 0410  AST 30  --   --   --  29  ALT 21  --   --   --  17  ALKPHOS 91  --   --   --  89  BILITOT 1.0  --   --   --  1.2  PROT 8.7*  --   --   --  8.5*  ALBUMIN 3.3* 2.2* 3.8 3.4* 3.5  3.4*   No results found for this basename: LIPASE, AMYLASE,  in the last 168 hours No results found for this basename: AMMONIA,  in the last 168 hours  CBC:  Recent Labs Lab 02/22/14 0345 02/23/14 0330 02/24/14 0400 02/25/14 0433 02/25/14 0827 02/26/14 0410  WBC 11.8* 12.9* 10.0 10.3  --  12.9*  HGB 9.9* 9.7* 9.1* 8.9* 10.9* 8.4*  HCT 32.3* 30.9* 29.5* 29.3* 32.0* 27.8*  MCV 98.8 96.0 98.7 100.3*  --  97.9  PLT 120* 152 159 148*  --  200    Cardiac Enzymes: No results found for this basename: CKTOTAL, CKMB, CKMBINDEX, TROPONINI,  in the last 168 hours  BNP: BNP (last 3 results) No results found for this basename: PROBNP,  in the last 8760 hours   Other results:  EKG: AF  Imaging: Dg Chest Port 1 View  02/26/2014   CLINICAL DATA:  Status post cardiac surgery  EXAM: PORTABLE CHEST - 1 VIEW  COMPARISON:  02/25/2014  FINDINGS: Postsurgical changes are again identified. A right-sided PICC line is seen at the cavoatrial junction. A temporary dialysis catheter is noted with the tip in the proximal superior vena cava. This is stable from the prior exam. The lungs are well aerated bilaterally. Left lower lobe atelectasis remains. No other focal abnormality is seen.  IMPRESSION: Stable appearance of the chest when compared with the prior exam.   Electronically Signed   By: Alcide CleverMark  Lukens M.D.   On: 02/26/2014 07:57   Dg Chest Port 1 View  02/25/2014   CLINICAL DATA:  Status post CABG 01/26/2014.  EXAM: PORTABLE CHEST - 1 VIEW  COMPARISON:  Single view of the chest 02/23/2014 and 02/21/2014.  FINDINGS: Support tubes and lines are unchanged. Left basilar airspace opacity most consistent with atelectasis and effusion persist. The right lung is clear. No pneumothorax  identified. Heart size is upper normal.  IMPRESSION: No change in left effusion and basilar atelectasis since the most recent examination. No new abnormality.   Electronically Signed   By: Maisie Fushomas  Dalessio M.D.   On: 02/25/2014 08:10   Dg Abd Portable 1v  02/26/2014   CLINICAL DATA:  Check feeding catheter placement  EXAM: PORTABLE ABDOMEN - 1 VIEW  COMPARISON:  02/25/2014  FINDINGS: Feeding catheter is again seen coiled within the stomach. The tip is directed towards the pyloric channel. Scattered large and small bowel gas is noted. Prominent Riedel's lobe of the liver is seen.  IMPRESSION: Feeding catheter within the stomach.   Electronically Signed   By: Alcide Clever M.D.   On: 02/26/2014 08:01   Dg Abd Portable 1v  02/25/2014   CLINICAL DATA:  Feeding tube placement  EXAM: PORTABLE ABDOMEN - 1 VIEW  COMPARISON:  Study obtained earlier in the day  FINDINGS: Feeding tube tip is at the level of the gastric pylorus. Bowel gas pattern is unremarkable. Temporary pacemaker leads are attached to the right heart.  IMPRESSION: Feeding tube tip at level of gastric pylorus.   Electronically Signed   By: Bretta Bang M.D.   On: 02/25/2014 16:13   Dg Abd Portable 1v  02/25/2014   CLINICAL DATA:  Feeding tube repositioning.  EXAM: PORTABLE ABDOMEN - 1 VIEW  COMPARISON:  02/23/2014.  FINDINGS: 1533 hr. The feeding tube is looped in the stomach with the tip in the fundal region. The bowel gas pattern is nonobstructive. Cholecystectomy clips are noted. There is elevation of the left hemidiaphragm with left basilar pulmonary atelectasis.  IMPRESSION: Feeding tube is looped in the stomach with the tip in the fundal region.   Electronically Signed   By: Roxy Horseman M.D.   On: 02/25/2014 15:49     Medications:     Scheduled Medications: . aspirin  324 mg Per Tube Daily  . bisacodyl  10 mg Oral Daily   Or  . bisacodyl  10 mg Rectal Daily  . chlorhexidine  15 mL Mouth Rinse BID  . feeding supplement  (PRO-STAT SUGAR FREE 64)  30 mL Per Tube QID  . Gerhardt's butt cream   Topical TID  . insulin aspart  0-24 Units Subcutaneous 6 times per day  . insulin glargine  15 Units Subcutaneous BID  . levalbuterol  0.63 mg Nebulization Q6H  . levothyroxine  75 mcg Per Tube QAC breakfast  . midodrine  10 mg Oral TID WC  . pantoprazole sodium  40 mg Per Tube Q1200  . potassium & sodium phosphates  1 packet Per Tube TID  . sodium chloride  10-40 mL Intracatheter Q12H    Infusions: . sodium chloride Stopped (02/20/14 0700)  . sodium chloride Stopped (02/20/14 1830)  . amiodarone 30 mg/hr (02/26/14 1300)  . feeding supplement (NEPRO CARB STEADY)    . heparin 1,500 Units/hr (02/26/14 1300)  . milrinone 0.25 mcg/kg/min (02/26/14 1300)  . norepinephrine (LEVOPHED) Adult infusion 12 mcg/min (02/26/14 1300)  . dialysis replacement fluid (prismasate) 300 mL/hr at 02/26/14 0601  . dialysis replacement fluid (prismasate) 200 mL/hr at 02/25/14 2208  . dialysate (PRISMASATE) 2,000 mL/hr at 02/26/14 1341    PRN Medications: acetaminophen, heparin, heparin, ondansetron (ZOFRAN) IV, oxyCODONE, sodium chloride, sodium chloride, traMADol   Assessment:   1) CAD - s/p CABG x 4 with MV replacement 2) A/C respiratory failure 3) AKI - On CVVHD 4) Cardiogenic shock 5) Afib/A Flutter 6) L Pleural effusion - s/p L CT 7) Anemia 8) Delerium, acute 9) ? 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 Shirlee Latch as well. She has  had a large inferior MI with RV involvement. Although RV only looks moderately down on echo TAPSE is very low c/w severe RV failure and is likely the source of her ongoing cardiogenic shock.   BP very soft and co-ox markedly reduced with levophed wean despite milrinone. Suspect profound cardiogenic shock due to RV failure. Echo unchanged. Will plan DC-CV today to see if this will improve hemodynamics though I am skeptical it will be sufficient.   Continue heparin and IV amio. No b-blocker or ACE due to shock and renal failure.   CAD is stable.   I am concerned prognosis is quite poor.   Clell Trahan,MD 1:59 PM

## 2014-02-26 NOTE — Progress Notes (Signed)
CT surgery p.m. Rounds  Uneventful day Tube feeds on hold pending DC cardioversion CVVH has been discontinued after circuit clotted Continue IV heparin and will start Coumadin tomorrow Stop IV maintenance fluid after tube feeds resumed after cardioversion

## 2014-02-26 NOTE — Progress Notes (Signed)
ANTICOAGULATION CONSULT NOTE - Follow Up Consult   HL = 0.25 (goal 0.3 - 0.5 units/mL per CVTS) Heparin dosing weight = 79 kg   Assessment: 72 YOF on IV heparin for Afib and prosthetic MVR.  Heparin level sub-therapeutic but is trending up.  No complications with infusion nor bleeding reported.  Noted CVVH discontinued and Coumadin to start tomorrow.   Plan: - Increase heparin gtt to 1600 units/hr - Check 8 hr HL post rate increase    [name], PharmD, BCPS Pager:  [phone] 02/26/2014, 5:31 PM

## 2014-02-26 NOTE — Progress Notes (Signed)
Subjective: Interval History: has no complaint .  Objective: Vital signs in last 24 hours: Temp:  [96.6 F (35.9 C)-98.4 F (36.9 C)] 98.1 F (36.7 C) (08/13 0346) Pulse Rate:  [93-123] 109 (08/13 0700) Resp:  [16-31] 20 (08/13 0700) BP: (74-126)/(31-79) 108/60 mmHg (08/13 0700) SpO2:  [93 %-100 %] 98 % (08/13 0700) Weight:  [83.1 kg (183 lb 3.2 oz)] 83.1 kg (183 lb 3.2 oz) (08/13 0500) Weight change: 1.362 kg (3 lb)  Intake/Output from previous day: 08/12 0701 - 08/13 0700 In: 3393.6 [I.V.:2285.6; NG/GT:850; IV Piggyback:258] Out: 2982  Intake/Output this shift:    General appearance: alert and cooperative Neck: LIJ cath Resp: rales LLL Cardio: irregularly irregular rhythm and systolic murmur: holosystolic 2/6, blowing at apex GI: pos bs, liver down 5 cm, soft Extremities: edema 2-3+  Lab Results:  Recent Labs  02/25/14 0433 02/25/14 0827 02/26/14 0410  WBC 10.3  --  12.9*  HGB 8.9* 10.9* 8.4*  HCT 29.3* 32.0* 27.8*  PLT 148*  --  200   BMET:  Recent Labs  02/25/14 1600 02/26/14 0410  NA 135* 135*  136*  K 4.5 4.7  4.8  CL 96 97  98  CO2 25 26  26   GLUCOSE 172* 116*  117*  BUN 23 23  23   CREATININE 0.92 0.95  0.95  CALCIUM 8.8 8.9  9.0   No results found for this basename: PTH,  in the last 72 hours Iron Studies: No results found for this basename: IRON, TIBC, TRANSFERRIN, FERRITIN,  in the last 72 hours  Studies/Results: Dg Chest Port 1 View  02/25/2014   CLINICAL DATA:  Status post CABG 01/26/2014.  EXAM: PORTABLE CHEST - 1 VIEW  COMPARISON:  Single view of the chest 02/23/2014 and 02/21/2014.  FINDINGS: Support tubes and lines are unchanged. Left basilar airspace opacity most consistent with atelectasis and effusion persist. The right lung is clear. No pneumothorax identified. Heart size is upper normal.  IMPRESSION: No change in left effusion and basilar atelectasis since the most recent examination. No new abnormality.   Electronically Signed    By: Drusilla Kanner M.D.   On: 02/25/2014 08:10   Dg Abd Portable 1v  02/25/2014   CLINICAL DATA:  Feeding tube placement  EXAM: PORTABLE ABDOMEN - 1 VIEW  COMPARISON:  Study obtained earlier in the day  FINDINGS: Feeding tube tip is at the level of the gastric pylorus. Bowel gas pattern is unremarkable. Temporary pacemaker leads are attached to the right heart.  IMPRESSION: Feeding tube tip at level of gastric pylorus.   Electronically Signed   By: Bretta Bang M.D.   On: 02/25/2014 16:13   Dg Abd Portable 1v  02/25/2014   CLINICAL DATA:  Feeding tube repositioning.  EXAM: PORTABLE ABDOMEN - 1 VIEW  COMPARISON:  02/23/2014.  FINDINGS: 1533 hr. The feeding tube is looped in the stomach with the tip in the fundal region. The bowel gas pattern is nonobstructive. Cholecystectomy clips are noted. There is elevation of the left hemidiaphragm with left basilar pulmonary atelectasis.  IMPRESSION: Feeding tube is looped in the stomach with the tip in the fundal region.   Electronically Signed   By: Roxy Horseman M.D.   On: 02/25/2014 15:49    I have reviewed the patient's current medications.  Assessment/Plan: 1  AKI oliguric ATN.  Recovery chances slim at this time.  Good solute, acid/base, K on CRRT.  will attempt to convert to IHD but skeptical with hemodynamics 2 Afib  for cardioversion 3 S/P CABG and MVR  Per CTS 4 Anemia fluctuating HB  ? accuracy 5 CM 6 PVD 7 Nutrition TF P stop CRRT, use epo, TF,  Cardiovert   LOS: 32 days   Norvell Ureste L 02/26/2014,7:07 AM

## 2014-02-26 NOTE — Anesthesia Postprocedure Evaluation (Signed)
  Anesthesia Post-op Note  Patient: [name]  Procedure(s) Performed: * No procedures listed *  Patient Location: SICU  Anesthesia Type:MAC  Level of Consciousness: sedated  Airway and Oxygen Therapy: Patient Spontanous Breathing and Patient connected to nasal cannula oxygen  Post-op Pain: none  Post-op Assessment: Post-op Vital signs reviewed, Patient's Cardiovascular Status Stable, Respiratory Function Stable, Patent Airway and No signs of Nausea or vomiting  Post-op Vital Signs: Reviewed and stable  Last Vitals:  Filed Vitals:   02/26/14 1800  BP: 105/57  Pulse: 116  Temp:   Resp: 24    Complications: No apparent anesthesia complications

## 2014-02-26 NOTE — Progress Notes (Addendum)
ANTICOAGULATION CONSULT NOTE - Follow Up Consult  Pharmacy Consult for Heparin  Indication: atrial fibrillation  No Known Allergies  Patient Measurements: Height: 5\' 7"  (170.2 cm) Weight: 183 lb 3.2 oz (83.1 kg) IBW/kg (Calculated) : 61.6  Vital Signs: Temp: 98.1 F (36.7 C) (08/13 0346) Temp src: Axillary (08/13 0346) BP: 108/60 mmHg (08/13 0700) Pulse Rate: 109 (08/13 0700)  Labs:  Recent Labs  02/24/14 0400  02/25/14 0433 02/25/14 0827 02/25/14 1600 02/26/14 0410  HGB 9.1*  --  8.9* 10.9*  --  8.4*  HCT 29.5*  --  29.3* 32.0*  --  27.8*  PLT 159  --  148*  --   --  200  HEPARINUNFRC 0.50  --  0.57  --   --  <0.10*  CREATININE 1.02  < > 1.00 1.00 0.92 0.95  0.95  < > = values in this interval not displayed.  Estimated Creatinine Clearance: 57.6 ml/min (by C-G formula based on Cr of 0.95).   Assessment: 73 y/o F on CRRT for acute renal failure post CABG.  She is on a heparin drip for prosthetic MVR and afib at 1400 units/hr, levels have been very labile over the past couple of days and is now undetectable. Per nurse, no interruptions in drip. H/H is low but relatively stable and platelets are now back up to 200, no bleeding noted. HIT negative on 7/15. Other labs as above.   Goal of Therapy:  Heparin level: 0.3-0.5 per Dr. Donata Clay Monitor platelets by anticoagulation protocol: Yes   Plan:  - Increase heparin to 1500 u/hr - 8 hr HL - Daily HL, CBC - Monitor platelets closely and s/s bleeding  Margie Billet, PharmD Clinical Pharmacist - Resident Pager: [phone] Pharmacy: [phone] 02/26/2014 7:17 AM

## 2014-02-26 NOTE — Progress Notes (Signed)
CRRT filter clotted . Treatment ended per Dr Detterding. 1400 units heparin instilled in each HD cath ports per orders.

## 2014-02-27 ENCOUNTER — Inpatient Hospital Stay (HOSPITAL_COMMUNITY): Payer: Medicare Other

## 2014-02-27 LAB — RENAL FUNCTION PANEL
Albumin: 3.1 g/dL — ABNORMAL LOW (ref 3.5–5.2)
Anion gap: 15 (ref 5–15)
BUN: 39 mg/dL — ABNORMAL HIGH (ref 6–23)
CALCIUM: 9 mg/dL (ref 8.4–10.5)
CHLORIDE: 97 meq/L (ref 96–112)
CO2: 23 meq/L (ref 19–32)
CREATININE: 2.21 mg/dL — AB (ref 0.50–1.10)
GFR calc Af Amer: 24 mL/min — ABNORMAL LOW (ref >90)
GFR, EST NON AFRICAN AMERICAN: 21 mL/min — AB (ref >90)
GLUCOSE: 215 mg/dL — AB (ref 70–99)
Phosphorus: 3.8 mg/dL (ref 2.3–4.6)
Potassium: 4.6 mEq/L (ref 3.7–5.3)
Sodium: 135 mEq/L — ABNORMAL LOW (ref 137–147)

## 2014-02-27 LAB — HEPATITIS B SURFACE ANTIGEN: Hepatitis B Surface Ag: NEGATIVE

## 2014-02-27 LAB — GLUCOSE, CAPILLARY
Glucose-Capillary: 196 mg/dL — ABNORMAL HIGH (ref 70–99)
Glucose-Capillary: 234 mg/dL — ABNORMAL HIGH (ref 70–99)
Glucose-Capillary: 169 mg/dL — ABNORMAL HIGH (ref 70–99)
Glucose-Capillary: 188 mg/dL — ABNORMAL HIGH (ref 70–99)
Glucose-Capillary: 262 mg/dL — ABNORMAL HIGH (ref 70–99)

## 2014-02-27 LAB — CBC
HCT: 26.1 % — ABNORMAL LOW (ref 36.0–46.0)
Hemoglobin: 8 g/dL — ABNORMAL LOW (ref 12.0–15.0)
MCH: 30.5 pg (ref 26.0–34.0)
MCHC: 30.7 g/dL (ref 30.0–36.0)
MCV: 99.6 fL (ref 78.0–100.0)
Platelets: 215 10*3/uL (ref 150–400)
RBC: 2.62 MIL/uL — ABNORMAL LOW (ref 3.87–5.11)
RDW: 19.3 % — ABNORMAL HIGH (ref 11.5–15.5)
WBC: 12 10*3/uL — ABNORMAL HIGH (ref 4.0–10.5)

## 2014-02-27 LAB — PROTIME-INR
INR: 1.23 (ref 0.00–1.49)
Prothrombin Time: 15.5 seconds — ABNORMAL HIGH (ref 11.6–15.2)

## 2014-02-27 LAB — HEPATITIS B CORE ANTIBODY, IGM: Hep B C IgM: NONREACTIVE

## 2014-02-27 LAB — HEPARIN LEVEL (UNFRACTIONATED)
Heparin Unfractionated: 0.3 IU/mL (ref 0.30–0.70)
Heparin Unfractionated: 0.3 IU/mL (ref 0.30–0.70)

## 2014-02-27 LAB — CARBOXYHEMOGLOBIN
Carboxyhemoglobin: 2.1 % — ABNORMAL HIGH (ref 0.5–1.5)
Methemoglobin: 1 % (ref 0.0–1.5)
O2 Saturation: 54.7 %
Total hemoglobin: 8 g/dL — ABNORMAL LOW (ref 12.0–16.0)

## 2014-02-27 LAB — PREPARE RBC (CROSSMATCH)

## 2014-02-27 LAB — MAGNESIUM: MAGNESIUM: 2.5 mg/dL (ref 1.5–2.5)

## 2014-02-27 MED ORDER — NEPRO/CARBSTEADY PO LIQD
237.0000 mL | ORAL | Status: DC | PRN
  Filled 2014-02-27: qty 237

## 2014-02-27 MED ORDER — HEPARIN SODIUM (PORCINE) 1000 UNIT/ML DIALYSIS
100.0000 [IU]/kg | INTRAMUSCULAR | Status: DC | PRN
  Filled 2014-02-27: qty 9

## 2014-02-27 MED ORDER — SODIUM CHLORIDE 0.9 % IV SOLN
100.0000 mL | INTRAVENOUS | Status: DC | PRN
Start: 2014-02-27 — End: 2014-03-06

## 2014-02-27 MED ORDER — HEPARIN SODIUM (PORCINE) 1000 UNIT/ML DIALYSIS: 1000.0000 [IU] | INTRAMUSCULAR | Status: DC | PRN

## 2014-02-27 MED ORDER — IOHEXOL 300 MG/ML  SOLN
50.0000 mL | Freq: Once | INTRAMUSCULAR | Status: AC | PRN
  Administered 2014-02-27: 50 mL

## 2014-02-27 MED ORDER — SODIUM CHLORIDE 0.9 % IV SOLN: 100.0000 mL | INTRAVENOUS | Status: DC | PRN

## 2014-02-27 MED ORDER — NEPRO/CARBSTEADY PO LIQD: 237.0000 mL | ORAL | Status: DC | PRN

## 2014-02-27 MED ORDER — LIDOCAINE HCL (PF) 1 % IJ SOLN: 5.0000 mL | INTRAMUSCULAR | Status: DC | PRN

## 2014-02-27 MED ORDER — INSULIN GLARGINE 100 UNIT/ML ~~LOC~~ SOLN
25.0000 [IU] | Freq: Two times a day (BID) | SUBCUTANEOUS | Status: DC
  Administered 2014-02-27: 25 [IU] via SUBCUTANEOUS
  Filled 2014-02-27 (×3): qty 0.25

## 2014-02-27 MED ORDER — PENTAFLUOROPROP-TETRAFLUOROETH EX AERO: 1.0000 "application " | INHALATION_SPRAY | CUTANEOUS | Status: DC | PRN

## 2014-02-27 MED ORDER — ALTEPLASE 2 MG IJ SOLR
2.0000 mg | Freq: Once | INTRAMUSCULAR | Status: AC | PRN
  Filled 2014-02-27: qty 2

## 2014-02-27 MED ORDER — WARFARIN SODIUM 2.5 MG PO TABS
2.5000 mg | ORAL_TABLET | Freq: Every day | ORAL | Status: DC
  Administered 2014-02-27 – 2014-03-01 (×3): 2.5 mg via ORAL
  Filled 2014-02-27 (×4): qty 1

## 2014-02-27 MED ORDER — ALTEPLASE 2 MG IJ SOLR: 2.0000 mg | Freq: Once | INTRAMUSCULAR | Status: DC | PRN

## 2014-02-27 MED ORDER — LIDOCAINE-PRILOCAINE 2.5-2.5 % EX CREA: 1.0000 "application " | TOPICAL_CREAM | CUTANEOUS | Status: DC | PRN

## 2014-02-27 MED ORDER — WARFARIN - PHYSICIAN DOSING INPATIENT
Freq: Every day | Status: DC
  Administered 2014-02-27 – 2014-03-06 (×6)

## 2014-02-27 NOTE — Progress Notes (Signed)
Physical Therapy Treatment Patient Details Name: [name] MRN: [id number] DOB: 07-Jul-1941 Today's Date: 2014/03/12    History of Present Illness Pt adm with MI and underwent emergent CABG x 4 and MVR on 7/13. Post op course complicated by VDRF and bil pleural effusions and requiring CVVHD. Extubated 7/31. Transitioned from CVVHD to HD on 03/13/23.  PMH - DM, MI, HTN,    PT Comments    Pt making slow progress.  Follow Up Recommendations  LTACH     Equipment Recommendations  Other (comment) (to be determined)    Recommendations for Other Services       Precautions / Restrictions Precautions Precautions: Fall;Other (comment) Precaution Comments: multiple lines/tubes    Mobility  Bed Mobility Overal bed mobility: Needs Assistance Bed Mobility: Rolling Rolling: +2 for physical assistance;Max assist            Transfers                 General transfer comment: Transferred pt bed to chair with maxisky with 2 person assist.  Ambulation/Gait                 Stairs            Wheelchair Mobility    Modified Rankin (Stroke Patients Only)       Balance                                    Cognition Arousal/Alertness: Awake/alert Behavior During Therapy: Flat affect Overall Cognitive Status: Impaired/Different from baseline         Following Commands: Follows one step commands consistently;Follows one step commands with increased time     Problem Solving: Requires verbal cues;Requires tactile cues General Comments: Will only answer with yes/no answers.    Exercises General Exercises - Upper Extremity Shoulder Flexion: AAROM;Both;10 reps;Seated (to 90 degrees) General Exercises - Lower Extremity Ankle Circles/Pumps: AAROM;Both;10 reps;Seated Heel Slides: AAROM;Both;5 reps;Seated Straight Leg Raises: AAROM;Both;10 reps;Seated    General Comments        Pertinent Vitals/Pain Pain Assessment: No/denies pain     Home Living                      Prior Function            PT Goals (current goals can now be found in the care plan section) Progress towards PT goals: Progressing toward goals    Frequency  Min 2X/week    PT Plan Current plan remains appropriate    Co-evaluation             End of Session Equipment Utilized During Treatment: Other (comment) (maxisky) Activity Tolerance: Patient tolerated treatment well Patient left: in bed;with call bell/phone within reach;with nursing/sitter in room     Time: 2440-1027 PT Time Calculation (min): 26 min  Charges:  $Therapeutic Exercise: 8-22 mins $Therapeutic Activity: 8-22 mins                    G Codes:      [name] Mar 12, 2014, 5:14 PM  [facility] PT [phone]

## 2014-02-27 NOTE — Procedures (Signed)
I was present at this session.  I have reviewed the session itself and made appropriate changes.  HD via temp cath, bp  Low 100s   [name] L 8/14/201511:13 AM

## 2014-02-27 NOTE — Progress Notes (Signed)
ANTICOAGULATION CONSULT NOTE   Pharmacy Consult for Heparin  Indication: atrial fibrillation  No Known Allergies  Patient Measurements: Height: 5\' 7"  (170.2 cm) Weight: 183 lb 3.2 oz (83.1 kg) IBW/kg (Calculated) : 61.6  Vital Signs: Temp: 98.4 F (36.9 C) (08/13 2334) Temp src: Oral (08/13 2334) BP: 103/48 mmHg (08/14 0000) Pulse Rate: 110 (08/14 0000)  Labs:  Recent Labs  02/24/14 0400  02/25/14 0433 02/25/14 0827 02/25/14 1600 02/26/14 0410 02/26/14 1527 02/26/14 1528 02/27/14 0130  HGB 9.1*  --  8.9* 10.9*  --  8.4*  --   --   --   HCT 29.5*  --  29.3* 32.0*  --  27.8*  --   --   --   PLT 159  --  148*  --   --  200  --   --   --   HEPARINUNFRC 0.50  --  0.57  --   --  <0.10*  --  0.25* 0.30  CREATININE 1.02  < > 1.00 1.00 0.92 0.95  0.95 1.13*  --   --   < > = values in this interval not displayed.  Estimated Creatinine Clearance: 48.4 ml/min (by C-G formula based on Cr of 1.13).   Assessment: 73 y/o F on CRRT for acute renal failure post CABG.  She is on a heparin drip for prosthetic MVR and afib at 1400 units/hr, levels have been very labile over the past couple of days and is now undetectable. Per nurse, no interruptions in drip. H/H is low but relatively stable and platelets are now back up to 200, no bleeding noted. HIT negative on 7/15. Other labs as above.   Follow up heparin level this am is at goal (0.3) no bleeding issues noted.  Goal of Therapy:  Heparin level: 0.3-0.5 per Dr. Donata Clay Monitor platelets by anticoagulation protocol: Yes   Plan:  - Heparin at 1600 u/hr - Recheck HL later in am to confirm - Daily HL, CBC - Monitor platelets closely and s/s bleeding  Sheppard Coil PharmD., BCPS Clinical Pharmacist Pager [phone] 02/27/2014 2:34 AM

## 2014-02-27 NOTE — Progress Notes (Signed)
Up in chair  BP 110/58  Pulse 111  Temp(Src) 99.6 F (37.6 C) (Oral)  Resp 27  Ht 5\' 7"  (1.702 m)  Wt 192 lb 3.9 oz (87.2 kg)  BMI 30.10 kg/m2  SpO2 95%   Intake/Output Summary (Last 24 hours) at 02/27/14 1800 Last data filed at 02/27/14 1700  Gross per 24 hour  Intake 2759.2 ml  Output   1077 ml  Net 1682.2 ml    Stable day  Coumadin ordered

## 2014-02-27 NOTE — Progress Notes (Signed)
ANTICOAGULATION CONSULT NOTE - Follow Up Consult  Pharmacy Consult for Heparin  Indication: atrial fibrillation  No Known Allergies  Patient Measurements: Height: 5\' 7"  (170.2 cm) Weight: 194 lb 14.2 oz (88.4 kg) IBW/kg (Calculated) : 61.6  Vital Signs: Temp: 98.7 F (37.1 C) (08/14 1200) Temp src: Axillary (08/14 1200) BP: 103/54 mmHg (08/14 1215) Pulse Rate: 118 (08/14 1215)  Labs:  Recent Labs  02/25/14 0433 02/25/14 0827  02/26/14 0410 02/26/14 1527 02/26/14 1528 02/27/14 0130 02/27/14 0410 02/27/14 1055  HGB 8.9* 10.9*  --  8.4*  --   --   --  8.0*  --   HCT 29.3* 32.0*  --  27.8*  --   --   --  26.1*  --   PLT 148*  --   --  200  --   --   --  215  --   LABPROT  --   --   --   --   --   --   --  15.5*  --   INR  --   --   --   --   --   --   --  1.23  --   HEPARINUNFRC 0.57  --   --  <0.10*  --  0.25* 0.30  --  0.30  CREATININE 1.00 1.00  < > 0.95  0.95 1.13*  --   --  2.21*  --   < > = values in this interval not displayed.  Estimated Creatinine Clearance: 25.5 ml/min (by C-G formula based on Cr of 2.21).   Assessment: 73 y/o F on CRRT for acute renal failure post CABG.  She is on a heparin drip for prosthetic MVR and afib at 1600 units/hr. Heparin level is within goal range.   Per nurse, no interruptions in drip. H/H is low but relatively stable and platelets are now back up to 215, no bleeding noted. HIT negative on 7/15. Other labs as above.   Goal of Therapy:  Heparin level: 0.3-0.5 per Dr. Donata Clay Monitor platelets by anticoagulation protocol: Yes   Plan:  - Continue IV heparin at current rate. - Daily HL, CBC - Monitor platelets closely and s/s bleeding  Tad Moore, BCPS  Clinical Pharmacist Pager [phone]  02/27/2014 12:36 PM

## 2014-02-27 NOTE — Progress Notes (Signed)
Subjective: Interval History: Cv yest and reverted to afib, no C/O  Objective: Vital signs in last 24 hours: Temp:  [97.8 F (36.6 C)-98.5 F (36.9 C)] 97.9 F (36.6 C) (08/14 0402) Pulse Rate:  [106-119] 112 (08/14 0630) Resp:  [12-33] 21 (08/14 0630) BP: (71-128)/(28-91) 113/53 mmHg (08/14 0630) SpO2:  [94 %-100 %] 97 % (08/14 0630) Weight:  [90.1 kg (198 lb 10.2 oz)] 90.1 kg (198 lb 10.2 oz) (08/14 0500) Weight change: 7 kg (15 lb 6.9 oz)  Intake/Output from previous day: 08/13 0701 - 08/14 0700 In: 2787.3 [I.V.:1997.3; NG/GT:790] Out: 899 [Stool:100] Intake/Output this shift:    General appearance: alert, cooperative and not interactive Neck: L IJ CAth Resp: diminished breath sounds bilaterally and rales LLL Cardio: irregularly irregular rhythm and systolic murmur: holosystolic 2/6, blowing at apex GI: pos bs, liver down 5 cm Extremities: edema 2+  Lab Results:  Recent Labs  02/26/14 0410 02/27/14 0410  WBC 12.9* 12.0*  HGB 8.4* 8.0*  HCT 27.8* 26.1*  PLT 200 215   BMET:  Recent Labs  02/26/14 1527 02/27/14 0410  NA 137 135*  K 4.4 4.6  CL 97 97  CO2 25 23  GLUCOSE 130* 215*  BUN 19 39*  CREATININE 1.13* 2.21*  CALCIUM 9.0 9.0   No results found for this basename: PTH,  in the last 72 hours Iron Studies: No results found for this basename: IRON, TIBC, TRANSFERRIN, FERRITIN,  in the last 72 hours  Studies/Results: Dg Chest Port 1 View  02/26/2014   CLINICAL DATA:  Status post cardiac surgery  EXAM: PORTABLE CHEST - 1 VIEW  COMPARISON:  02/25/2014  FINDINGS: Postsurgical changes are again identified. A right-sided PICC line is seen at the cavoatrial junction. A temporary dialysis catheter is noted with the tip in the proximal superior vena cava. This is stable from the prior exam. The lungs are well aerated bilaterally. Left lower lobe atelectasis remains. No other focal abnormality is seen.  IMPRESSION: Stable appearance of the chest when compared with  the prior exam.   Electronically Signed   By: Alcide Clever M.D.   On: 02/26/2014 07:57   Dg Abd Portable 1v  02/26/2014   CLINICAL DATA:  Check feeding catheter placement  EXAM: PORTABLE ABDOMEN - 1 VIEW  COMPARISON:  02/25/2014  FINDINGS: Feeding catheter is again seen coiled within the stomach. The tip is directed towards the pyloric channel. Scattered large and small bowel gas is noted. Prominent Riedel's lobe of the liver is seen.  IMPRESSION: Feeding catheter within the stomach.   Electronically Signed   By: Alcide Clever M.D.   On: 02/26/2014 08:01   Dg Abd Portable 1v  02/25/2014   CLINICAL DATA:  Feeding tube placement  EXAM: PORTABLE ABDOMEN - 1 VIEW  COMPARISON:  Study obtained earlier in the day  FINDINGS: Feeding tube tip is at the level of the gastric pylorus. Bowel gas pattern is unremarkable. Temporary pacemaker leads are attached to the right heart.  IMPRESSION: Feeding tube tip at level of gastric pylorus.   Electronically Signed   By: Bretta Bang M.D.   On: 02/25/2014 16:13   Dg Abd Portable 1v  02/25/2014   CLINICAL DATA:  Feeding tube repositioning.  EXAM: PORTABLE ABDOMEN - 1 VIEW  COMPARISON:  02/23/2014.  FINDINGS: 1533 hr. The feeding tube is looped in the stomach with the tip in the fundal region. The bowel gas pattern is nonobstructive. Cholecystectomy clips are noted. There is elevation of the  left hemidiaphragm with left basilar pulmonary atelectasis.  IMPRESSION: Feeding tube is looped in the stomach with the tip in the fundal region.   Electronically Signed   By: Roxy HorsemanBill  Veazey M.D.   On: 02/25/2014 15:49    I have reviewed the patient's current medications.  Assessment/Plan: 1  AKI oliguric ATN.  Vol xs but with RHF cannot remove.  BP marginal, on M[place] and NE.  Will try HD today and tomorrow, short. 2 Anemia stable 3 CABG/MVR per CTS 4 Afib per Cards 5 Nutrition on TF 6 DM controlled P HD, epo, ? CV, TF.  Pressors, DM control    LOS: 33 days    [name] L 02/27/2014,7:35 AM

## 2014-02-27 NOTE — Progress Notes (Signed)
33 Days Post-Op Procedure(s) (LRB): CORONARY ARTERY BYPASS GRAFTING TIMES FOUR USING LEFT INTERNAL MAMMARY ARTERY TO LAD, SAPHENOUS VEIN GRAFTS TO OM1, OM2, AND PDA (N/A) MITRAL VALVE (MV) REPLACEMENT (N/A) Subjective: Patient resting comfortably today without specific complaint Remains in atrial flutter heart rate 100-110 Morning mixed venous saturation 55% on low-dose milrinone Hemodialysis scheduled for today, CVP 10, chest x-ray with minimal edema Hemoglobin slowly declined to 8.0 with IV heparin for A. fib-we'll give one unit with dialysis Coumadin per feeding tube being started Panda  tube in distal stomach-we'll direct postpyloric by C-arm fluoroscopy  Objective: Vital signs in last 24 hours: Temp:  [97.9 F (36.6 C)-98.8 F (37.1 C)] 98.8 F (37.1 C) (08/14 0744) Pulse Rate:  [106-119] 108 (08/14 1100) Cardiac Rhythm:  [-] Atrial flutter (08/14 0800) Resp:  [12-33] 24 (08/14 1100) BP: (71-123)/(38-72) 101/48 mmHg (08/14 1000) SpO2:  [94 %-100 %] 96 % (08/14 1100) Weight:  [198 lb 10.2 oz (90.1 kg)] 198 lb 10.2 oz (90.1 kg) (08/14 0500)  Hemodynamic parameters for last 24 hours: CVP:  [4 mmHg-14 mmHg] 10 mmHg  Intake/Output from previous day: 08/13 0701 - 08/14 0700 In: 2796.7 [I.V.:2006.7; NG/GT:790] Out: 899 [Stool:100] Intake/Output this shift: Total I/O In: 482.8 [I.V.:142.8; NG/GT:340] Out: -   abd nontender extrem warm  Lab Results:  Recent Labs  02/26/14 0410 02/27/14 0410  WBC 12.9* 12.0*  HGB 8.4* 8.0*  HCT 27.8* 26.1*  PLT 200 215   BMET:  Recent Labs  02/26/14 1527 02/27/14 0410  NA 137 135*  K 4.4 4.6  CL 97 97  CO2 25 23  GLUCOSE 130* 215*  BUN 19 39*  CREATININE 1.13* 2.21*  CALCIUM 9.0 9.0    PT/INR:  Recent Labs  02/27/14 0410  LABPROT 15.5*  INR 1.23   ABG    Component Value Date/Time   PHART 7.463* 02/17/2014 1013   HCO3 26.6* 02/17/2014 1013   TCO2 23 02/25/2014 0827   ACIDBASEDEF 0.1 01/30/2014 0339   O2SAT 54.7  02/27/2014 0504   CBG (last 3)   Recent Labs  02/26/14 2316 02/27/14 0337 02/27/14 0740  GLUCAP 215* 196* 234*    Assessment/Plan: S/P Procedure(s) (LRB): CORONARY ARTERY BYPASS GRAFTING TIMES FOUR USING LEFT INTERNAL MAMMARY ARTERY TO LAD, SAPHENOUS VEIN GRAFTS TO OM1, OM2, AND PDA (N/A) MITRAL VALVE (MV) REPLACEMENT (N/A) Mobilize Diabetes control- inc Lantus  OOB to chair now CVVH off Starting coumadin- cont hep per pharm until INR 2  LOS: 33 days    [name],Brooke 02/27/2014

## 2014-02-27 NOTE — Progress Notes (Addendum)
Advanced Heart Failure Rounding Note   Subjective:    [name] is a 73 yo female with a history of CAD s/p PCI at [facility], HTN, TIA, DM2 and PAD. [facility] with CP,Vtach and ST changes in inferior leads and was taken to the cath lab. She had severe multivessel CAD involving 100% distal RCA with tandem 90% & 80% lesions, proximal D1 100%, proximal LAD & AVGroove Circ 90% with 80% OM2 and was evaluated by Dr. Maren BeachVanTrigt for CABG and IABP was placed.  Underwent emergent CABGx4 and MV replacement (LIMA-LAD, SVG to OM1, SVG to OM2 and SVG to PDA) on 01/26/14. She was transferred to the ICU on multiple pressors and IABP. UOP was sluggish and remained volume overloaded and lasix gtt was started. IABP removed 7/17. TF started for nutrition. Had difficulty with thrombocytopenia which improved and HIT studies negative and Coumadin was restarted. Underwent thoracentesis on R on 02/06/14. Extubated 7/25 but re-intubated 7/27 electively for therapeutic bronchoscopy showing no significant mucus, airways clear w/ exception of what appeared to be suction trauma at the inlet of the RUL. Underwent L CT by IR on 7/27. Her Creatinine was stable for awhile and then started trending up even on milrinone. Underwent HD cath placement 7/28 and started on CVVHD on 7/29. Extubated again 02/13/14. Has had issues with Afib/Aflutter.   ECHO 02/17/14. Reviewed personally EF 35-40% Inferior wall out. RV moderately HK with TAPSE 1.18cm  Remains on CVVHD. Underwent DC-CV yesterday but reverted to AF soon after. Amio load ongoing.  Remains confused. SBP soft on levophed 10 and milrinone 0.25. CVVHD being switched to HD. Getting blood today.  CVP 10. Co-ox improved at 55%  Objective:   Weight Range:  Vital Signs:   Temp:  [97.9 F (36.6 C)-98.8 F (37.1 C)] 98.8 F (37.1 C) (08/14 0744) Pulse Rate:  [106-119] 114 (08/14 1115) Resp:  [12-33] 24 (08/14 1115) BP: (81-123)/(38-72) 103/49 mmHg (08/14 1109) SpO2:  [94 %-100  %] 98 % (08/14 1115) Weight:  [88.4 kg (194 lb 14.2 oz)-90.1 kg (198 lb 10.2 oz)] 88.4 kg (194 lb 14.2 oz) (08/14 1100) Last BM Date: 02/24/14  Weight change: Filed Weights   02/26/14 0500 02/27/14 0500 02/27/14 1100  Weight: 83.1 kg (183 lb 3.2 oz) 90.1 kg (198 lb 10.2 oz) 88.4 kg (194 lb 14.2 oz)    Intake/Output:   Intake/Output Summary (Last 24 hours) at 02/27/14 1128 Last data filed at 02/27/14 1000  Gross per 24 hour  Intake 2763.7 ml  Output    512 ml  Net 2251.7 ml     Physical Exam: CVP 10 with prominent CV waves General:  Chronically ill appearing. No resp difficulty confused HEENT: normal; Panda intact; L IJ HD cath Neck: supple. JVP 10 Carotids 2+ bilat; no bruits. No lymphadenopathy or thryomegaly appreciated. Cor: PMI nondisplaced. Regular rate & irregular rhythm. No rubs, gallops or murmurs. Lungs: Diminished in the bases Abdomen: soft, nontender, nondistended. No hepatosplenomegaly. No bruits or masses. Good bowel sounds. Extremities: no cyanosis, clubbing, rash, no edema; R 3L PICC Neuro: alert. Working with [employer]moves all 4 extremities w/o difficulty. Affect flat   Telemetry: Afib/flutter 90-120s  Labs: Basic Metabolic Panel:  Recent Labs Lab 02/23/14 0330  02/24/14 0400  02/25/14 0433 02/25/14 0827 02/25/14 1600 02/26/14 0410 02/26/14 1527 02/27/14 0410  NA 136*  < > 135*  < > 135* 134* 135* 135*  136* 137 135*  K 4.0  < > 4.4  < > 4.9  4.7 4.5 4.7  4.8 4.4 4.6  CL 96  < > 97  < > 96 98 96 97  98 97 97  CO2 25  < > 25  < > 25  --  25 26  26 25 23   GLUCOSE 124*  < > 171*  < > 198* 250* 172* 116*  117* 130* 215*  BUN 26*  < > 21  < > 24* 23 23 23  23 19  39*  CREATININE 1.11*  < > 1.02  < > 1.00 1.00 0.92 0.95  0.95 1.13* 2.21*  CALCIUM 8.8  < > 9.0  < > 9.2  --  8.8 8.9  9.0 9.0 9.0  MG 2.6*  --  2.6*  --  2.7*  --   --  2.5  --  2.5  PHOS 2.5  < > 2.3  < > 2.1*  --  3.3 2.7  2.6 2.3 3.8  < > = values in this interval not  displayed.  Liver Function Tests:  Recent Labs Lab 02/24/14 0400  02/25/14 0433 02/25/14 1600 02/26/14 0410 02/26/14 1527 02/27/14 0410  AST 30  --   --   --  29  --   --   ALT 21  --   --   --  17  --   --   ALKPHOS 91  --   --   --  89  --   --   BILITOT 1.0  --   --   --  1.2  --   --   PROT 8.7*  --   --   --  8.5*  --   --   ALBUMIN 3.3*  < > 3.8 3.4* 3.5  3.4* 3.2* 3.1*  < > = values in this interval not displayed. No results found for this basename: LIPASE, AMYLASE,  in the last 168 hours No results found for this basename: AMMONIA,  in the last 168 hours  CBC:  Recent Labs Lab 02/23/14 0330 02/24/14 0400 02/25/14 0433 02/25/14 0827 02/26/14 0410 02/27/14 0410  WBC 12.9* 10.0 10.3  --  12.9* 12.0*  HGB 9.7* 9.1* 8.9* 10.9* 8.4* 8.0*  HCT 30.9* 29.5* 29.3* 32.0* 27.8* 26.1*  MCV 96.0 98.7 100.3*  --  97.9 99.6  PLT 152 159 148*  --  200 215    Cardiac Enzymes: No results found for this basename: CKTOTAL, CKMB, CKMBINDEX, TROPONINI,  in the last 168 hours  BNP: BNP (last 3 results) No results found for this basename: PROBNP,  in the last 8760 hours   Other results:  EKG: AF  Imaging: Dg Chest Port 1 View  02/27/2014   CLINICAL DATA:  73 year old female status post CABG with respiratory failure. Initial encounter.  EXAM: PORTABLE CHEST - 1 VIEW  COMPARISON:  02/26/2014 and earlier.  FINDINGS: Portable AP semi upright view at 0610 hrs. Enteric feeding tube remains in place. Stable left IJ and right PICC line catheters. Stable cardiac size and mediastinal contours. Sequelae of cardiac valve replacement and CABG. No pneumothorax. Stable pulmonary vascularity without overt edema. Continued retrocardiac, left lung base opacity. No areas of worsening ventilation.  IMPRESSION: Stable. Continued left lung base opacity favored to reflect a combination of pleural effusion and atelectasis.   Electronically Signed   By: Augusto Gamble M.D.   On: 02/27/2014 07:54   Dg Chest  Port 1 View  02/26/2014   CLINICAL DATA:  Status post cardiac surgery  EXAM: PORTABLE CHEST -  1 VIEW  COMPARISON:  02/25/2014  FINDINGS: Postsurgical changes are again identified. A right-sided PICC line is seen at the cavoatrial junction. A temporary dialysis catheter is noted with the tip in the proximal superior vena cava. This is stable from the prior exam. The lungs are well aerated bilaterally. Left lower lobe atelectasis remains. No other focal abnormality is seen.  IMPRESSION: Stable appearance of the chest when compared with the prior exam.   Electronically Signed   By: Alcide Clever M.D.   On: 02/26/2014 07:57   Dg Abd Portable 1v  02/27/2014   CLINICAL DATA:  73 year old female status post feeding tube placement. Initial encounter.  EXAM: PORTABLE ABDOMEN - 1 VIEW  COMPARISON:  02/26/2014.  FINDINGS: Portable AP supine view at 0748 hrs. Feeding tube remains looped within the stomach. The weighted tip is effacing distally at the level of the distal body, but is not yet post pyloric. There is adequate slack to allow for post pyloric transit.  Mild motion artifact. Stable bowel gas pattern. Stable surgical clips in the right abdomen. Aortoiliac calcified atherosclerosis noted. Stable visualized osseous structures.  IMPRESSION: Stable feeding tube tip at the distal stomach, not yet post pyloric.   Electronically Signed   By: Augusto Gamble M.D.   On: 02/27/2014 08:02   Dg Abd Portable 1v  02/26/2014   CLINICAL DATA:  Check feeding catheter placement  EXAM: PORTABLE ABDOMEN - 1 VIEW  COMPARISON:  02/25/2014  FINDINGS: Feeding catheter is again seen coiled within the stomach. The tip is directed towards the pyloric channel. Scattered large and small bowel gas is noted. Prominent Riedel's lobe of the liver is seen.  IMPRESSION: Feeding catheter within the stomach.   Electronically Signed   By: Alcide Clever M.D.   On: 02/26/2014 08:01   Dg Abd Portable 1v  02/25/2014   CLINICAL DATA:  Feeding tube placement   EXAM: PORTABLE ABDOMEN - 1 VIEW  COMPARISON:  Study obtained earlier in the day  FINDINGS: Feeding tube tip is at the level of the gastric pylorus. Bowel gas pattern is unremarkable. Temporary pacemaker leads are attached to the right heart.  IMPRESSION: Feeding tube tip at level of gastric pylorus.   Electronically Signed   By: Bretta Bang M.D.   On: 02/25/2014 16:13   Dg Abd Portable 1v  02/25/2014   CLINICAL DATA:  Feeding tube repositioning.  EXAM: PORTABLE ABDOMEN - 1 VIEW  COMPARISON:  02/23/2014.  FINDINGS: 1533 hr. The feeding tube is looped in the stomach with the tip in the fundal region. The bowel gas pattern is nonobstructive. Cholecystectomy clips are noted. There is elevation of the left hemidiaphragm with left basilar pulmonary atelectasis.  IMPRESSION: Feeding tube is looped in the stomach with the tip in the fundal region.   Electronically Signed   By: Roxy Horseman M.D.   On: 02/25/2014 15:49     Medications:     Scheduled Medications: . aspirin  324 mg Per Tube Daily  . bisacodyl  10 mg Oral Daily   Or  . bisacodyl  10 mg Rectal Daily  . chlorhexidine  15 mL Mouth Rinse BID  . feeding supplement (PRO-STAT SUGAR FREE 64)  30 mL Per Tube QID  . Gerhardt's butt cream   Topical TID  . insulin aspart  0-24 Units Subcutaneous 6 times per day  . insulin glargine  25 Units Subcutaneous BID  . levalbuterol  0.63 mg Nebulization Q6H  . levothyroxine  75 mcg Per  Tube QAC breakfast  . midodrine  10 mg Oral TID WC  . pantoprazole sodium  40 mg Per Tube Q1200  . potassium & sodium phosphates  1 packet Per Tube TID  . sodium chloride  10-40 mL Intracatheter Q12H  . warfarin  2.5 mg Oral q1800    Infusions: . sodium chloride Stopped (02/20/14 0700)  . sodium chloride Stopped (02/20/14 1830)  . amiodarone 30 mg/hr (02/27/14 1000)  . feeding supplement (NEPRO CARB STEADY) 1,000 mL (02/27/14 1000)  . heparin 1,600 Units/hr (02/27/14 1000)  . milrinone 0.25 mcg/kg/min (02/27/14  1000)  . norepinephrine (LEVOPHED) Adult infusion 10 mcg/min (02/27/14 1000)    PRN Medications: sodium chloride, sodium chloride, sodium chloride, sodium chloride, acetaminophen, alteplase, alteplase, feeding supplement (NEPRO CARB STEADY), feeding supplement (NEPRO CARB STEADY), heparin, heparin, heparin, heparin, lidocaine (PF), lidocaine (PF), lidocaine-prilocaine, lidocaine-prilocaine, ondansetron (ZOFRAN) IV, oxyCODONE, pentafluoroprop-tetrafluoroeth, pentafluoroprop-tetrafluoroeth, sodium chloride, traMADol   Assessment:   1) CAD - s/p CABG x 4 with MV replacement 2) A/C respiratory failure 3) AKI - On CVVHD 4) Cardiogenic shock 5) Afib/A Flutter 6) L Pleural effusion - s/p L CT 7) Anemia 8) Delerium, acute 9) ? Dementia 10) HCAP 11) RV failure   Plan/Discussion:    Continues to struggle with RV failure and shock. On levophed and milrinone. Co-ox somewhat improved but BP remains soft. Switching to HD today. Keep CVP 10-12 with RV failure.   Failed DC-CV yesterday. Continue amio load and plan repeat DC-CV tomorrow. Continue heparin. No b-blocker or ACE due to shock and renal failure.   CAD is stable.   I am concerned prognosis is quite poor.   Danijah Noh,MD 11:28 AM

## 2014-02-28 ENCOUNTER — Inpatient Hospital Stay (HOSPITAL_COMMUNITY): Payer: Medicare Other

## 2014-02-28 DIAGNOSIS — I10 Essential (primary) hypertension: Secondary | ICD-10-CM

## 2014-02-28 LAB — TYPE AND SCREEN
ABO/RH(D): A POS
Antibody Screen: NEGATIVE
Unit division: 0

## 2014-02-28 LAB — POCT I-STAT, CHEM 8
BUN: 35 mg/dL — ABNORMAL HIGH (ref 6–23)
Calcium, Ion: 1.19 mmol/L (ref 1.13–1.30)
Chloride: 97 mEq/L (ref 96–112)
Creatinine, Ser: 2.7 mg/dL — ABNORMAL HIGH (ref 0.50–1.10)
Glucose, Bld: 244 mg/dL — ABNORMAL HIGH (ref 70–99)
HCT: 30 % — ABNORMAL LOW (ref 36.0–46.0)
Hemoglobin: 10.2 g/dL — ABNORMAL LOW (ref 12.0–15.0)
Potassium: 3.7 mEq/L (ref 3.7–5.3)
Sodium: 136 mEq/L — ABNORMAL LOW (ref 137–147)
TCO2: 23 mmol/L (ref 0–100)

## 2014-02-28 LAB — PROTIME-INR
INR: 1.23 (ref 0.00–1.49)
Prothrombin Time: 15.5 seconds — ABNORMAL HIGH (ref 11.6–15.2)

## 2014-02-28 LAB — GLUCOSE, CAPILLARY
Glucose-Capillary: 125 mg/dL — ABNORMAL HIGH (ref 70–99)
Glucose-Capillary: 191 mg/dL — ABNORMAL HIGH (ref 70–99)
Glucose-Capillary: 218 mg/dL — ABNORMAL HIGH (ref 70–99)
Glucose-Capillary: 223 mg/dL — ABNORMAL HIGH (ref 70–99)
Glucose-Capillary: 260 mg/dL — ABNORMAL HIGH (ref 70–99)
Glucose-Capillary: 272 mg/dL — ABNORMAL HIGH (ref 70–99)

## 2014-02-28 LAB — CBC
HCT: 27.9 % — ABNORMAL LOW (ref 36.0–46.0)
Hemoglobin: 8.7 g/dL — ABNORMAL LOW (ref 12.0–15.0)
MCH: 29.7 pg (ref 26.0–34.0)
MCHC: 31.2 g/dL (ref 30.0–36.0)
MCV: 95.2 fL (ref 78.0–100.0)
Platelets: 220 10*3/uL (ref 150–400)
RBC: 2.93 MIL/uL — ABNORMAL LOW (ref 3.87–5.11)
RDW: 19.4 % — ABNORMAL HIGH (ref 11.5–15.5)
WBC: 12.3 10*3/uL — ABNORMAL HIGH (ref 4.0–10.5)

## 2014-02-28 LAB — CARBOXYHEMOGLOBIN
Carboxyhemoglobin: 2.3 % — ABNORMAL HIGH (ref 0.5–1.5)
Methemoglobin: 0.9 % (ref 0.0–1.5)
O2 Saturation: 60.3 %
Total hemoglobin: 8.6 g/dL — ABNORMAL LOW (ref 12.0–16.0)

## 2014-02-28 LAB — HEPARIN LEVEL (UNFRACTIONATED): Heparin Unfractionated: 0.3 IU/mL (ref 0.30–0.70)

## 2014-02-28 LAB — MAGNESIUM: Magnesium: 2.1 mg/dL (ref 1.5–2.5)

## 2014-02-28 MED ORDER — INSULIN GLARGINE 100 UNIT/ML ~~LOC~~ SOLN
30.0000 [IU] | Freq: Two times a day (BID) | SUBCUTANEOUS | Status: DC
  Administered 2014-02-28 (×2): 30 [IU] via SUBCUTANEOUS
  Filled 2014-02-28 (×3): qty 0.3

## 2014-02-28 MED ORDER — LEVALBUTEROL HCL 0.63 MG/3ML IN NEBU: 0.6300 mg | INHALATION_SOLUTION | Freq: Four times a day (QID) | RESPIRATORY_TRACT | Status: DC | PRN

## 2014-02-28 NOTE — Progress Notes (Signed)
ANTICOAGULATION CONSULT NOTE - Follow Up Consult  Pharmacy Consult for Heparin  Indication: atrial fibrillation  No Known Allergies  Patient Measurements: Height: 5\' 7"  (170.2 cm) Weight: 188 lb 11.4 oz (85.6 kg) IBW/kg (Calculated) : 61.6  Vital Signs: Temp: 98.7 F (37.1 C) (08/15 0758) Temp src: Axillary (08/15 0758) BP: 92/54 mmHg (08/15 1100) Pulse Rate: 112 (08/15 1100)  Labs:  Recent Labs  02/26/14 0410 02/26/14 1527  02/27/14 0130 02/27/14 0410 02/27/14 1055 02/28/14 0248 02/28/14 0548  HGB 8.4*  --   --   --  8.0*  --  8.7* 10.2*  HCT 27.8*  --   --   --  26.1*  --  27.9* 30.0*  PLT 200  --   --   --  215  --  220  --   LABPROT  --   --   --   --  15.5*  --  15.5*  --   INR  --   --   --   --  1.23  --  1.23  --   HEPARINUNFRC <0.10*  --   < > 0.30  --  0.30 0.30  --   CREATININE 0.95  0.95 1.13*  --   --  2.21*  --   --  2.70*  < > = values in this interval not displayed.  Estimated Creatinine Clearance: 20.5 ml/min (by C-G formula based on Cr of 2.7).   Assessment: 73 y/o F transitioning to [facility] for acute renal failure post CABG.  She is on a heparin drip for prosthetic MVR and afib at 1600 units/hr. Heparin level is within goal range.   H/H is low but relatively stable.  Suspect today's iSTAT labs are falsely elevated.  No bleeding noted. HIT negative on 7/15. Other labs as above.   Goal of Therapy:  Heparin level: 0.3-0.5 per Dr. Donata Clay Monitor platelets by anticoagulation protocol: Yes   Plan:  - Continue IV heparin at current rate. - Daily heparin level and CBC - Monitor platelets closely and s/s bleeding -Continue Coumadin dosing per MD  Dixie Dials, Pharm.D., BCPS Clinical Pharmacist Pager [phone] 02/28/2014 12:01 PM

## 2014-02-28 NOTE — Progress Notes (Signed)
Subjective: Interval History: has no complaint.  Objective: Vital signs in last 24 hours: Temp:  [98.5 F (36.9 C)-99.8 F (37.7 C)] 98.7 F (37.1 C) (08/15 0758) Pulse Rate:  [108-126] 112 (08/15 0730) Resp:  [16-29] 23 (08/15 0730) BP: (74-118)/(29-84) 118/47 mmHg (08/15 0700) SpO2:  [94 %-100 %] 96 % (08/15 0750) Weight:  [85.6 kg (188 lb 11.4 oz)-88.4 kg (194 lb 14.2 oz)] 85.6 kg (188 lb 11.4 oz) (08/15 0530) Weight change: -1.7 kg (-3 lb 12 oz)  Intake/Output from previous day: 08/14 0701 - 08/15 0700 In: 2590.6 [I.V.:1245.6; Blood:335; NG/GT:1010] Out: 1277 [Stool:300] Intake/Output this shift:    General appearance: alert, pale and slowed mentation Neck: LIJ cath Resp: diminished breath sounds bilaterally and rales LLL Cardio: irregularly irregular rhythm and systolic murmur: holosystolic 2/6, blowing at apex GI: pos bs, soft, liver down 5 cm Extremities: edema 1+  Lab Results:  Recent Labs  02/27/14 0410 02/28/14 0248 02/28/14 0548  WBC 12.0* 12.3*  --   HGB 8.0* 8.7* 10.2*  HCT 26.1* 27.9* 30.0*  PLT 215 220  --    BMET:  Recent Labs  02/26/14 1527 02/27/14 0410 02/28/14 0548  NA 137 135* 136*  K 4.4 4.6 3.7  CL 97 97 97  CO2 25 23  --   GLUCOSE 130* 215* 244*  BUN 19 39* 35*  CREATININE 1.13* 2.21* 2.70*  CALCIUM 9.0 9.0  --    No results found for this basename: PTH,  in the last 72 hours Iron Studies: No results found for this basename: IRON, TIBC, TRANSFERRIN, FERRITIN,  in the last 72 hours  Studies/Results: Dg Abd 1 View  02/27/2014   CLINICAL DATA:  Feeding tube placement  EXAM: ABDOMEN - 1 VIEW  COMPARISON:  Abdominal film of 27 February 2014 at 7:48 a.m.  FINDINGS: The feeding tube has been repositioned or replaced. The current tube tip lies in the left upper quadrant of the abdomen and appears to lie within the proximal jejunum. There is contrast present which outlines a small bowel mucosal pattern consistent with jejunum.  IMPRESSION:  The feeding tube tip now lies in the proximal jejunum.   Electronically Signed   By: David  [place]   On: 02/27/2014 17:08   Dg Chest Port 1 View  02/28/2014   CLINICAL DATA:  CABG.  EXAM: PORTABLE CHEST - 1 VIEW  COMPARISON:  02/27/2014.  FINDINGS: The heart remains enlarged. Prior CABG and valve replacement. There is improved vascular congestion. Tubes and lines remain stable. No pneumothorax. Mild retrocardiac density, slight subsegmental atelectasis, also improved.  IMPRESSION: Improved vascular congestion.  No new findings.   Electronically Signed   By: Davonna Belling M.D.   On: 02/28/2014 08:08   Dg Chest Port 1 View  02/27/2014   CLINICAL DATA:  73 year old female status post CABG with respiratory failure. Initial encounter.  EXAM: PORTABLE CHEST - 1 VIEW  COMPARISON:  02/26/2014 and earlier.  FINDINGS: Portable AP semi upright view at 0610 hrs. Enteric feeding tube remains in place. Stable left IJ and right PICC line catheters. Stable cardiac size and mediastinal contours. Sequelae of cardiac valve replacement and CABG. No pneumothorax. Stable pulmonary vascularity without overt edema. Continued retrocardiac, left lung base opacity. No areas of worsening ventilation.  IMPRESSION: Stable. Continued left lung base opacity favored to reflect a combination of pleural effusion and atelectasis.   Electronically Signed   By: Augusto Gamble M.D.   On: 02/27/2014 07:54   Dg Abd Portable 1v  02/27/2014   CLINICAL DATA:  73 year old female status post feeding tube placement. Initial encounter.  EXAM: PORTABLE ABDOMEN - 1 VIEW  COMPARISON:  02/26/2014.  FINDINGS: Portable AP supine view at 0748 hrs. Feeding tube remains looped within the stomach. The weighted tip is effacing distally at the level of the distal body, but is not yet post pyloric. There is adequate slack to allow for post pyloric transit.  Mild motion artifact. Stable bowel gas pattern. Stable surgical clips in the right abdomen. Aortoiliac calcified  atherosclerosis noted. Stable visualized osseous structures.  IMPRESSION: Stable feeding tube tip at the distal stomach, not yet post pyloric.   Electronically Signed   By: Augusto GambleLee  Hall M.D.   On: 02/27/2014 08:02   Dg Basil Dessaso G Tube Plc W/fl-no Rad  02/27/2014   CLINICAL DATA: need post pyloric panda   NASO G TUBE PLACEMENT WITH FLUORO  Fluoroscopy was utilized by the requesting physician.  No radiographic  interpretation.     I have reviewed the patient's current medications.  Assessment/Plan: 1 AKI oliguric.  Did well with short, limited vol off HD yest, repeat today. 2 Anemia  ? Varialbility. Ok 3 Confusion 4 Afib per Cards 5 S/P CABG, MVR per CTS 6 Nutrition  7 Mobility 8 CM controlled P HD, get PC next week if stable, cont support, Tf, mobilize    LOS: 34 days   Coleton Woon L 02/28/2014,8:19 AM

## 2014-02-28 NOTE — Progress Notes (Signed)
SUBJECTIVE:  sleepy  OBJECTIVE:   Vitals:   Filed Vitals:   02/28/14 0800 02/28/14 0900 02/28/14 1000 02/28/14 1100  BP: 114/59 119/70 114/69 92/54  Pulse: 112 111 115 112  Temp:      TempSrc:      Resp: 23 24 27 27   Height:      Weight:      SpO2: 96% 95% 94% 95%   I&O's:   Intake/Output Summary (Last 24 hours) at 02/28/14 1140 Last data filed at 02/28/14 1100  Gross per 24 hour  Intake 2698.8 ml  Output   1377 ml  Net 1321.8 ml   TELEMETRY: Reviewed telemetry pt in ? Sinus tachycardia     PHYSICAL EXAM General: Well developed, well nourished, in no acute distress Head: Eyes PERRLA, No xanthomas.   Normal cephalic and atramatic  Lungs:   Clear bilaterally to auscultation anteriorly Heart:   tachy S1 S2 Pulses are 2+ & equal. Abdomen: Bowel sounds are positive, abdomen soft and non-tender without masses Extremities:   No clubbing, cyanosis or edema.  DP +1  LABS: Basic Metabolic Panel:  Recent Labs  52/84/13 1527 02/27/14 0410 02/28/14 0248 02/28/14 0548  NA 137 135*  --  136*  K 4.4 4.6  --  3.7  CL 97 97  --  97  CO2 25 23  --   --   GLUCOSE 130* 215*  --  244*  BUN 19 39*  --  35*  CREATININE 1.13* 2.21*  --  2.70*  CALCIUM 9.0 9.0  --   --   MG  --  2.5 2.1  --   PHOS 2.3 3.8  --   --    Liver Function Tests:  Recent Labs  02/26/14 0410 02/26/14 1527 02/27/14 0410  AST 29  --   --   ALT 17  --   --   ALKPHOS 89  --   --   BILITOT 1.2  --   --   PROT 8.5*  --   --   ALBUMIN 3.5  3.4* 3.2* 3.1*   No results found for this basename: LIPASE, AMYLASE,  in the last 72 hours CBC:  Recent Labs  02/27/14 0410 02/28/14 0248 02/28/14 0548  WBC 12.0* 12.3*  --   HGB 8.0* 8.7* 10.2*  HCT 26.1* 27.9* 30.0*  MCV 99.6 95.2  --   PLT 215 220  --    Cardiac Enzymes: No results found for this basename: CKTOTAL, CKMB, CKMBINDEX, TROPONINI,  in the last 72 hours BNP: No components found with this basename: POCBNP,  D-Dimer: No results found  for this basename: DDIMER,  in the last 72 hours Hemoglobin A1C: No results found for this basename: HGBA1C,  in the last 72 hours Fasting Lipid Panel: No results found for this basename: CHOL, HDL, LDLCALC, TRIG, CHOLHDL, LDLDIRECT,  in the last 72 hours Thyroid Function Tests: No results found for this basename: TSH, T4TOTAL, FREET3, T3FREE, THYROIDAB,  in the last 72 hours Anemia Panel: No results found for this basename: VITAMINB12, FOLATE, FERRITIN, TIBC, IRON, RETICCTPCT,  in the last 72 hours Coag Panel:   Lab Results  Component Value Date   INR 1.23 02/28/2014   INR 1.23 02/27/2014   INR 1.73* 01/26/2014    RADIOLOGY: Dg Chest 1 View  02/17/2014   CLINICAL DATA:  Left-sided chest tube removal.  EXAM: CHEST - 1 VIEW  COMPARISON:  One-view chest 02/17/2014.  FINDINGS: The heart is enlarged. The left-sided  chest tube has been removed. There is no pneumothorax. A left IJ catheter is stable in position. A right-sided PICC line is stable in position. The NG tube courses off the inferior border the film. There is interval increase in diffuse interstitial edema. Bilateral pleural effusions are noted.  IMPRESSION: 1. Interval removal of left-sided chest tube without pneumothorax. 2. The support apparatus is otherwise stable. 3. Interval increase in diffuse interstitial edema.   Electronically Signed   By: [name] M.D.   On: 02/17/2014 12:19   Dg Abd 1 View  02/27/2014   CLINICAL DATA:  Feeding tube placement  EXAM: ABDOMEN - 1 VIEW  COMPARISON:  Abdominal film of 27 February 2014 at 7:48 a.m.  FINDINGS: The feeding tube has been repositioned or replaced. The current tube tip lies in the left upper quadrant of the abdomen and appears to lie within the proximal jejunum. There is contrast present which outlines a small bowel mucosal pattern consistent with jejunum.  IMPRESSION: The feeding tube tip now lies in the proximal jejunum.   Electronically Signed   By: David  [place]   On: 02/27/2014 17:08     Dg Abd 1 View  02/23/2014   CLINICAL DATA:  Panda placement  EXAM: ABDOMEN - 1 VIEW  COMPARISON:  None.  FINDINGS: Interval placement of a feeding tube with the metallic tip projecting over the fourth portion of the duodenum. The bowel gas pattern is normal. No radio-opaque calculi or other significant radiographic abnormality are seen.  IMPRESSION: Interval placement of a feeding tube with the metallic portion projecting over the fourth portion of the duodenum.   Electronically Signed   By: Elige Ko   On: 02/23/2014 12:07   Ct Chest Wo Contrast  02/09/2014   CLINICAL DATA:  History of CABG and mitral valve replacement complicated by subsequent respiratory failure, now with large symptomatic left-sided pleural effusion. Please evaluate left-sided pleural fluid prior to potential CT-guided left-sided percutaneous drainage catheter placement.  EXAM: CT CHEST WITHOUT CONTRAST  TECHNIQUE: Multidetector CT imaging of the chest was performed following the standard protocol without IV contrast.  COMPARISON:  None.  FINDINGS: Evaluation of the pulmonary parenchyma is degraded secondary to patient respiratory artifact.  There is a small right and moderate-sized left-sided pleural effusions with associated atelectasis/collapse of the left lower lobe and lingula as well as partial atelectasis of the right lower lobe. Ill-defined calcifications within the collapsed left lower lobe may represent granulomas. No discrete pulmonary nodules given the limitations of this examination.  Scattered mediastinal lymph nodes individually not enlarged by size criteria with index precarinal lymph node measuring 0.8 cm in greatest short axis diameter. No definite mediastinal, hilar or axillary lymphadenopathy on this noncontrast examination.  Endotracheal to tip terminates superior to the carina. No pneumothorax. Right upper extremity approach PICC line tip terminates within the superior cavoatrial junction.  Post median sternotomy  and CABG with extensive calcifications within the native coronary arteries. Epicardial pacing wires persist. There is a very minimal amount of pericardial thickening, presumably postoperative in nature. Post mitral valve replacement with extensive calcifications within the mitral valve annulus. Scattered atherosclerotic plaque within a normal caliber thoracic aorta. The left vertebral artery is incidentally noted to arise directly from the aortic arch.  Enteric tube tip terminates within the mid body of the stomach. There is a radiopaque structure seen within the left upper abdomen, incompletely imaged. There is a small amount of intra-abdominal ascites. There is an ill-defined approximately 2.2 x 1.6 cm  hypo attenuating lesion within the mid body of the spleen (image 74, series 2), which is incompletely characterized on this noncontrast examination though possibly representative of a splenic cyst and/or hemangioma. Exuberant calcifications within the splenic artery. Punctate calcifications within the dome of the right lobe of the liver, the sequela of prior granulomatous infection.  No acute or aggressive osseous abnormalities. The thyroid gland appears heterogeneous and possibly minimally enlarged post incompletely imaged on the present examination.  IMPRESSION: 1. Small right and moderate left-sided pleural effusions with associated bilateral atelectasis/collapse, left greater than right. 2. Post median sternotomy, CABG and mitral valve replacement without evidence of complication on this noncontrast examination. 3. Appropriately positioned support apparatus.  No pneumothorax. 4. Small amount of ascites seen within the imaged upper abdomen.   Electronically Signed   By: Simonne Come M.D.   On: 02/09/2014 16:26   US Renal Port  02/11/2014   CLINICAL DATA:  Acute renal failure. History of diabetes and hypertension.  EXAM: RENAL/URINARY TRACT ULTRASOUND COMPLETE  COMPARISON:  Chest CT 02/09/2014  FINDINGS: Right  Kidney:  Length: 10.7 cm. There is mild renal cortical thinning and increased echogenicity. The renal sinus fat is prominent. There is no hydronephrosis or focal cortical abnormality.  Left Kidney:  Length: 10.4 cm. Echogenicity within normal limits. No mass or hydronephrosis visualized.  Bladder:  Decompressed by Foley catheter and not visualized.  Ascites and a right pleural effusion are noted.  IMPRESSION: No evidence of hydronephrosis. The right kidney demonstrates mild cortical thinning and increased echogenicity. Bladder is not visualized, appearing decompressed by Foley catheter.   Electronically Signed   By: Roxy Horseman M.D.   On: 02/11/2014 16:25   Dg Chest Port 1 View  02/28/2014   CLINICAL DATA:  CABG.  EXAM: PORTABLE CHEST - 1 VIEW  COMPARISON:  02/27/2014.  FINDINGS: The heart remains enlarged. Prior CABG and valve replacement. There is improved vascular congestion. Tubes and lines remain stable. No pneumothorax. Mild retrocardiac density, slight subsegmental atelectasis, also improved.  IMPRESSION: Improved vascular congestion.  No new findings.   Electronically Signed   By: Davonna Belling M.D.   On: 02/28/2014 08:08   Dg Chest Port 1 View  02/27/2014   CLINICAL DATA:  73 year old female status post CABG with respiratory failure. Initial encounter.  EXAM: PORTABLE CHEST - 1 VIEW  COMPARISON:  02/26/2014 and earlier.  FINDINGS: Portable AP semi upright view at 0610 hrs. Enteric feeding tube remains in place. Stable left IJ and right PICC line catheters. Stable cardiac size and mediastinal contours. Sequelae of cardiac valve replacement and CABG. No pneumothorax. Stable pulmonary vascularity without overt edema. Continued retrocardiac, left lung base opacity. No areas of worsening ventilation.  IMPRESSION: Stable. Continued left lung base opacity favored to reflect a combination of pleural effusion and atelectasis.   Electronically Signed   By: Augusto Gamble M.D.   On: 02/27/2014 07:54   Dg Chest Port  1 View  02/26/2014   CLINICAL DATA:  Status post cardiac surgery  EXAM: PORTABLE CHEST - 1 VIEW  COMPARISON:  02/25/2014  FINDINGS: Postsurgical changes are again identified. A right-sided PICC line is seen at the cavoatrial junction. A temporary dialysis catheter is noted with the tip in the proximal superior vena cava. This is stable from the prior exam. The lungs are well aerated bilaterally. Left lower lobe atelectasis remains. No other focal abnormality is seen.  IMPRESSION: Stable appearance of the chest when compared with the prior exam.   Electronically Signed  By: Alcide Clever M.D.   On: 02/26/2014 07:57   Dg Chest Port 1 View  02/25/2014   CLINICAL DATA:  Status post CABG 01/26/2014.  EXAM: PORTABLE CHEST - 1 VIEW  COMPARISON:  Single view of the chest 02/23/2014 and 02/21/2014.  FINDINGS: Support tubes and lines are unchanged. Left basilar airspace opacity most consistent with atelectasis and effusion persist. The right lung is clear. No pneumothorax identified. Heart size is upper normal.  IMPRESSION: No change in left effusion and basilar atelectasis since the most recent examination. No new abnormality.   Electronically Signed   By: Drusilla Kanner M.D.   On: 02/25/2014 08:10   Dg Chest Port 1 View  02/23/2014   CLINICAL DATA:  Postop evaluation.  EXAM: PORTABLE CHEST - 1 VIEW  COMPARISON:  02/21/2014  FINDINGS: There is a left jugular dialysis catheter with the tip in the upper SVC region. Feeding tube extends into the upper abdomen. Hazy densities at the left lung base are suggestive for atelectasis and a small amount of pleural fluid. Patient is status post cardiac surgery. There is no evidence for a large pneumothorax. Heart size is stable. PICC line tip extends into the lower SVC region.  IMPRESSION: Left basilar densities are suggestive for atelectasis and cannot exclude a small pleural effusion.  Support apparatuses appear to be stable.   Electronically Signed   By: Richarda Overlie M.D.    On: 02/23/2014 07:40   Dg Chest Port 1 View  02/21/2014   CLINICAL DATA:  Status post cardiac surgery.  Check chest tube.  EXAM: PORTABLE CHEST - 1 VIEW  COMPARISON:  One-view chest 02/19/2014.  FINDINGS: A small bore feeding tube courses off the inferior border of the film. A left IJ catheter is stable in position. No chest tube is in place. A right-sided PICC line is stable in position.  The heart size is normal. The patient is status post mitral valve replacement. Mild edema is improved. A small left pleural effusion and associated atelectasis is again noted.  IMPRESSION: 1. Stable support apparatus. 2. Improving aeration. 3. Small left pleural effusion and associated atelectasis.   Electronically Signed   By: [name] M.D.   On: 02/21/2014 08:50   Dg Chest Portable 1 View  02/19/2014   CLINICAL DATA:  Pleural effusions.  Pulmonary edema.  EXAM: PORTABLE CHEST - 1 VIEW  COMPARISON:  02/18/2014 and 02/17/2014  FINDINGS: Pulmonary edema has resolved. There small bilateral pleural effusions, diminished. Pulmonary vascularity is now normal. Prosthetic mitral valve noted. CABG.  Feeding tube tip is below the diaphragm. PICC tip is at the cavoatrial junction.  IMPRESSION: Pulmonary edema resolved.  Decreasing pleural effusions.   Electronically Signed   By: Geanie Cooley M.D.   On: 02/19/2014 07:16   Dg Chest Port 1 View  02/18/2014   CLINICAL DATA:  Pulmonary edema  EXAM: PORTABLE CHEST - 1 VIEW  COMPARISON:  February 17, 2014  FINDINGS: Dual-lumen central catheter has its tip in the superior vena cava. Right central catheter tip is also in the superior vena cava. Feeding tube tip is below the diaphragm and not seen. No pneumothorax. There is less interstitial edema compared to 1 day prior. There is atelectatic change in both lung bases. There are effusions bilaterally, larger on the right than on the left, stable. Cardiomegaly is stable. There is pulmonary venous hypertension. Patient is status post mitral  valve replacement and coronary artery bypass grafting.  IMPRESSION: Less interstitial edema compared to  1 day prior. Moderate edema remains. Other changes of congestive heart failure are stable. There is no new opacity. No pneumothorax.   Electronically Signed   By: Bretta Bang M.D.   On: 02/18/2014 07:09   Dg Chest Portable 1 View  02/17/2014   CLINICAL DATA:  Respiratory failure.  EXAM: PORTABLE CHEST - 1 VIEW  COMPARISON:  02/16/2014, 02/13/2014  FINDINGS: Left IJ catheter, NG tube, PICC line in stable position. Left chest tube in stable position. Cardiomegaly with pulmonary vascular congestion. Prior CABG. Mild bilateral alveolar infiltrates are present. Bilateral pleural effusion present. These findings suggest congestive heart failure. Left lower lobe atelectasis. No pneumothorax. No acute osseous abnormality.  IMPRESSION: 1. Line and tube position stable. Left chest tube in unchanged position. No pneumothorax. 2. Cardiomegaly. Prior CABG. Bilateral pulmonary infiltrates and pleural effusions suggest congestive heart failure with pulmonary edema. 3. Left lower lobe atelectasis.   Electronically Signed   By: Maisie Fus  Register   On: 02/17/2014 07:44   Dg Chest Port 1 View  02/16/2014   CLINICAL DATA:  Followup effusions  EXAM: PORTABLE CHEST - 1 VIEW  COMPARISON:  02/14/2014  FINDINGS: Postsurgical changes are again seen. The cardiac shadow is stable. A feeding catheter, right-sided PICC line and left-sided central venous catheter are again seen and stable. A left-sided pleural drain is again noted. No pneumothorax or sizable pleural effusion is noted on the left. Persistent right-sided pleural effusion is seen. Mild vascular congestion remains.  IMPRESSION: Persistent right-sided effusion stable from the prior exam. Persistent vascular congestion is noted as well.  Persistent left pleural drain in satisfactory position.  Tubes and lines as described.   Electronically Signed   By: Alcide Clever M.D.    On: 02/16/2014 07:22   Dg Chest Port 1 View  02/14/2014   CLINICAL DATA:  Mitral valve replacement.  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 Cardiac silhouette remains enlarged. Left chest pigtail catheter remains in place, stable to slightly lower in the chest than on the prior study. Left jugular central venous catheter remains in place with tip overlying the mid SVC. Right PICC remains in place with tip near the cavoatrial junction. Endotracheal tube has been removed. Enteric tube courses into the left upper abdomen with tip not imaged. There is persistent pulmonary vascular congestion with hazy bilateral lung opacities, slightly improved from the prior study. Small right and possibly small left pleural effusions are suspected. No pneumothorax is identified.  IMPRESSION: 1. Interval removal of endotracheal tube. Other lines and tubes as above. 2. Pulmonary vascular congestion and hazy bilateral lung opacities, compatible with pulmonary edema and slightly improved from prior. Small right and possibly small left pleural effusions.   Electronically Signed   By: Sebastian Ache   On: 02/14/2014 07:26   Dg Chest Port 1 View  02/13/2014   CLINICAL DATA:  Status post CABG  EXAM: PORTABLE CHEST - 1 VIEW  COMPARISON:  Portable chest of February 12, 2014  FINDINGS: The pulmonary interstitial markings have increased bilaterally. The hemidiaphragms are obscured though this is stable. The cardiac silhouette remains enlarged. The endotracheal tube tip lies 4.5 cm above the crotch of the carina. The right-sided PICC line in the left internal jugular venous catheter remain in appropriate position. The pigtail catheter on the left is stable. No pneumothorax is demonstrated.  IMPRESSION: Worsening pulmonary interstitial edema bilaterally may reflect the patient's volume status  or cardiac function. Pleural fluid on the right and  possibly on the left is present.   Electronically Signed   By: David  [place]   On: 02/13/2014 07:36   Dg Chest Port 1 View  02/12/2014   CLINICAL DATA:  Status post mitral valve replacement  EXAM: PORTABLE CHEST - 1 VIEW  COMPARISON:  Portable chest x-ray of February 07, 2014  FINDINGS: The lungs are well-expanded. There has been interval improvement in the appearance of the left lung base. The pigtail catheter is unchanged in position. On the right there is density over the lower hemi thorax which may reflect pleural fluid layering posteriorly. This is more conspicuous today but could reflect the fact that today's exam was performed in the semi upright position versus yesterday's upright position.  The cardiac silhouette is normal in size. The mitral valve ring is visible as are coronary artery graft markers. The pulmonary vascularity is prominent centrally but is more distinct today. The endotracheal tube tip lies approximately 3.4 cm above the crotch of the carina. The feeding tube tip projects off the inferior margin of the study. The left internal jugular venous catheter tip lies in the midportion of the SVC.  IMPRESSION: There has been slight interval improvement in the appearance of the pulmonary interstitium on the left. There remain findings of mild CHF. There is a right-sided pleural effusion.   Electronically Signed   By: David  [place]   On: 02/12/2014 07:50   Dg Chest Port 1 View  02/11/2014   CLINICAL DATA:  CABG.  EXAM: PORTABLE CHEST - 1 VIEW  COMPARISON:  02/10/2014 and 02/09/2014  FINDINGS: Patient is rotated to the right. Endotracheal tube has tip approximately 5 cm above the carina. Left IJ central venous catheter has tip over the SVC. Right-sided PICC line is unchanged. Enteric tube courses into the region of the stomach and off the inferior portion of the film. Left-sided chest tube unchanged.  Lungs are adequately inflated with mild improved aeration over the right base. Mild worsening hazy  opacification in the left mid to lower lung and perihilar region. No definite pneumothorax. Stable cardiomegaly. Remainder of the exam is unchanged.  IMPRESSION: Worsening hazy opacification over the left perihilar region and mid to lower lung which may be due to asymmetric edema versus infection with basilar effusions/atelectasis. Improved aeration right base.  Stable cardiomegaly.  Tubes and lines as described.   Electronically Signed   By: Elberta Fortis M.D.   On: 02/11/2014 07:49   Dg Chest Port 1 View  02/10/2014   CLINICAL DATA:  Dialysis catheter placement.  EXAM: PORTABLE CHEST - 1 VIEW  COMPARISON:  Chest x-ray 02/07/2014.  FINDINGS: Endotracheal tube, NG tube, right PICC line, left IJ catheter in good anatomic position. Left chest tube noted and good anatomic position. No pneumothorax. Cardiomegaly. Prior median sternotomy and CABG. Prior valve replacement. Pulmonary venous congestion with bilateral pulmonary alveolar infiltrates and pleural effusions consistent with congestive heart failure. Carotid atherosclerotic vascular calcification. No acute bony abnormality.  IMPRESSION: 1. Interim placement of left IJ dialysis catheter, its tip is in the region of the superior vena cava. 2. Left chest tube noted. No pneumothorax. Remainder of the lines and tubes are in stable position. 3. Congestive heart failure with pulmonary edema and bilateral pleural effusions. No significant improvement from prior study. 4. Carotid vascular disease.   Electronically Signed   By: Maisie Fus  Register   On: 02/10/2014 15:47   Dg Chest Port 1 View  02/10/2014  CLINICAL DATA:  Status post chest tube placement  EXAM: PORTABLE CHEST - 1 VIEW  COMPARISON:  02/09/2014  FINDINGS: An endotracheal tube is noted 3.7 cm above the carina. Feeding catheter extends into the stomach. The left-sided chest tube is now seen near complete reduction of the left pleural effusion. No pneumothorax is noted. Left basilar atelectasis is noted. A  small right-sided pleural effusion seen. A PICC line is noted in the mid superior vena cava. Postsurgical changes are again noted.  IMPRESSION: Significant reduction in left-sided pleural effusion. Persistent left basilar atelectasis is noted. Small right-sided pleural effusion remains.  Tubes and lines as described.   Electronically Signed   By: Alcide Clever M.D.   On: 02/10/2014 07:41   Dg Chest Port 1 View  02/09/2014   CLINICAL DATA:  Hypoxia  EXAM: PORTABLE CHEST - 1 VIEW  COMPARISON:  Study obtained earlier in the day  FINDINGS: Endotracheal tube tip is 3.5 cm above the carina. Central catheter tip is in the superior vena cava. No pneumothorax.  There is a sizable effusion on the left which appears slightly smaller compared to earlier in the day. There is atelectatic change throughout much of the left lung is well. There may be superimposed airspace consolidation on the left. There is a much smaller effusion on the right, stable. Right lung is otherwise clear.  Heart is mildly enlarged. 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 No adenopathy is apparent. The pulmonary vascularity is within normal limits.  IMPRESSION: Tube and catheter positions as described without pneumothorax. Bilateral effusions, much larger on the left than on the right. Effusion on the left is slightly smaller compared to earlier in the day. There is extensive atelectatic change on the left. There may be some airspace consolidation superimposed with effusion on the left as well. No change in cardiac silhouette.   Electronically Signed   By: Bretta Bang M.D.   On: 02/09/2014 10:12   Dg Chest Port 1 View  02/09/2014   CLINICAL DATA:  Respiratory failure.  EXAM: PORTABLE CHEST - 1 VIEW  COMPARISON:  02/07/2014.  FINDINGS: Interim removal of endotracheal tube and NG tube. Right PICC line in stable position. Progressive opacification of the left hemi thorax is present. This could be from  pulmonary infiltrate and/or effusion. Persistent right lower lobe infiltrate and right pleural effusion noted. Cardiomegaly. Prior median sternotomy. CABG. Cardiac valve replacement. No pneumothorax. No acute bony abnormality.  IMPRESSION: 1. PICC line in stable position. Interim removal of endotracheal tube and NGT. 2. Complete opacification of the left hemithorax. This could be from underlying pulmonary infiltrate and/or pleural effusion. 3. Persistent right pulmonary infiltrate and small right pleural effusion. 4. Cardiomegaly. Prior CABG and cardiac valve replacement. A component of congestive heart failure may be present.   Electronically Signed   By: Maisie Fus  Register   On: 02/09/2014 07:48   Dg Chest Port 1 View  02/07/2014   CLINICAL DATA:  Pneumonia.  EXAM: PORTABLE CHEST - 1 VIEW  COMPARISON:  02/06/2014.  FINDINGS: The endotracheal tube, NG tube and right PICC line are stable. Persistent mild cardiac enlargement with pulmonary edema, pleural effusions and areas of atelectasis.  IMPRESSION: Stable support apparatus.  Persistent edema, atelectasis and effusions.   Electronically Signed   By: Loralie Champagne M.D.   On: 02/07/2014 08:37   Dg Chest Port 1 View  02/06/2014   CLINICAL DATA:  Status post right thoracentesis  EXAM: PORTABLE CHEST - 1  VIEW  COMPARISON:  02/06/2014  FINDINGS: Resolution of the right effusion following thoracentesis. No pneumothorax. Posterior layering moderate left effusion remains. Stable support apparatus. Endotracheal tube 6 cm above the carina. Prior coronary bypass and aortic valve replacement. Atherosclerosis of the aorta.  IMPRESSION: Resolved right effusion following thoracentesis.  No pneumothorax.  Persistent left effusion.   Electronically Signed   By: Ruel Favors M.D.   On: 02/06/2014 15:49   Dg Chest Port 1 View  02/06/2014   CLINICAL DATA:  Evaluate endotracheal tube.  EXAM: PORTABLE CHEST - 1 VIEW  COMPARISON:  02/05/2014  FINDINGS: Endotracheal tube is 4.2  cm above the carina. Nasogastric tube extends into the abdomen. Prominent vascular structures with diffuse lung densities. Persistent basilar densities which are incompletely evaluated. Heart size is within normal limits. PICC line tip is in the lower SVC region. Feeding tube extends into the abdomen.  IMPRESSION: Diffuse lung densities are suggestive for pulmonary edema.  Persistent bibasilar densities probably represent a combination of atelectasis and pleural fluid.  Support apparatuses as described.   Electronically Signed   By: Richarda Overlie M.D.   On: 02/06/2014 07:48   Dg Chest Port 1 View  02/05/2014   CLINICAL DATA:  Status post coronary artery bypass graft.  EXAM: PORTABLE CHEST - 1 VIEW  COMPARISON:  February 04, 2014.  FINDINGS: Stable cardiomediastinal silhouette. Endotracheal tube is noted with distal tip 5 cm above the carina. Nasogastric tube is seen entering the stomach. Status post coronary artery bypass graft. Bilateral pulmonary edema appears improved. Stable bilateral pleural effusions are noted. Swan-Ganz catheter has been removed. No pneumothorax is noted.  IMPRESSION: Endotracheal tube in grossly good position. Improved bilateral pulmonary edema with stable bilateral pleural effusions.   Electronically Signed   By: Roque Lias M.D.   On: 02/05/2014 08:01   Dg Chest Port 1 View  02/04/2014   CLINICAL DATA:  Status post CABG  EXAM: PORTABLE CHEST - 1 VIEW  COMPARISON:  Portable chest x-ray of February 03, 2014  FINDINGS: Positioning is limited on today's study. The pulmonary interstitial markings remain increased. The right hemidiaphragm remains obscured. The left hemidiaphragm is excluded from the image. The cardiac silhouette is mildly enlarged and the pulmonary vascularity remains engorged.  The endotracheal tube tip lies 4.7 cm above the crotch of the carina. The Swan-Ganz catheter tip is in the proximal right main pulmonary artery. The intra-aortic balloon pump marker lies just below the  aortic knob. Faintly demonstrated is a mediastinal drain on the left. The esophagogastric tube tip projects off the image.  IMPRESSION: Stable appearance of the lungs consistent with interstitial edema, bibasilar atelectasis, and bilateral pleural effusions. The support tubes and lines are in appropriate position.   Electronically Signed   By: David  [place]   On: 02/04/2014 07:56   Dg Chest Port 1 View  02/03/2014   CLINICAL DATA:  Status post CABG and mitral valve replacement  EXAM: PORTABLE CHEST - 1 VIEW  COMPARISON:  Portable chest x-ray of February 02, 2014  FINDINGS: The cardiopericardial silhouette is mildly enlarged though stable. The mitral valve ring is visible. The pulmonary vascularity is more prominent today. The interstitial markings are slightly more prominent as in both lungs. The hemidiaphragms remain obscured.  The endotracheal tube tip lies approximately 4 cm above the crotch of the carina. The esophagogastric tube tip projects off the inferior margin of the image. The Swan-Ganz type catheter tip lies in the proximal to midportion of the right main  pulmonary artery. The PICC line on the right has its tip in the midportion of the SVC.  IMPRESSION: There has been slight interval increase in the pulmonary interstitial edema. Small bilateral pleural effusions persist.   Electronically Signed   By: David  [place]   On: 02/03/2014 07:36   Dg Chest Port 1 View  02/02/2014   CLINICAL DATA:  Status post CABG and mitral valve repair.  EXAM: PORTABLE CHEST - 1 VIEW  COMPARISON:  Single view of the chest 02/01/2014 and 01/31/2014.  FINDINGS: Support tubes and lines are unchanged. Bilateral effusions and basilar airspace disease have worsened. Cardiomegaly and pulmonary edema are again seen.  IMPRESSION: Increased bilateral effusions and basilar airspace disease.  Pulmonary edema persists.   Electronically Signed   By: Drusilla Kannerhomas  Dalessio M.D.   On: 02/02/2014 07:57   Dg Chest Port 1 View  02/01/2014    CLINICAL DATA:  Respiratory failure and status post CABG and mitral valve replacement.  EXAM: PORTABLE CHEST - 1 VIEW  COMPARISON:  01/31/2014  FINDINGS: Endotracheal tube remains with tip approximately 5 cm above the carina. Swan-Ganz catheter positioning is stable in the right pulmonary artery. Right PICC line extends into SVC. Lungs show slight improvement in left lower lobe atelectasis. There remains a small left pleural effusion. Mild interstitial edema is stable. The heart size and mediastinal contours are stable. No pneumothorax.  IMPRESSION: Slight diminishment in left lower lobe atelectasis. Stable mild interstitial edema and small left pleural effusion.   Electronically Signed   By: Irish LackGlenn  Yamagata M.D.   On: 02/01/2014 08:23   Dg Chest Port 1 View  01/31/2014   CLINICAL DATA:  Status post cardiac surgery  EXAM: PORTABLE CHEST - 1 VIEW  COMPARISON:  01/30/2014  FINDINGS: Hazy lung base opacity consistent with a combination of effusions and atelectasis, stable. Mild central vascular prominence is also stable. No overt pulmonary edema. No gross pneumothorax on this semi-erect study.  Cardiac silhouette is mildly enlarged.  No mediastinal widening.  Endotracheal tube, right internal jugular Swan-Ganz catheter, nasogastric tube and right PICC are stable in well positioned. Left chest tube has been removed. New enteric tube passes below the diaphragm into the stomach and below the included field of view.  IMPRESSION: 1. Persistent small pleural effusions with lung base atelectasis. No overt edema. No mediastinal widening. No gross pneumothorax. 2. Status post removal of the left chest tube. New enteric tube passes at least into the distal stomach. 3. No other change.   Electronically Signed   By: Amie Portlandavid  Ormond M.D.   On: 01/31/2014 07:59   Dg Chest Port 1 View  01/30/2014   CLINICAL DATA:  Status post emergency CABG  EXAM: PORTABLE CHEST - 1 VIEW  COMPARISON:  Portable chest x-ray of January 29, 2014   FINDINGS: The lungs are reasonably well inflated. The left hemidiaphragm remains obscured. The cardiac silhouette is normal in size. A prosthetic mitral valve ring. The pulmonary vascularity is mildly prominent centrally but has decreased in conspicuity since yesterday's study.  The endotracheal tube tip lies 5.6 cm above the crotch of the carina. The esophagogastric tube tip projects off the inferior margin of the image. The Swan-Ganz catheter tip projecting in the region of the proximal right pulmonary artery. The intra-aortic balloon pump marker lies cm below the aortic arch. The left-sided chest tube is stable with the tip projecting between the fourth and fifth posterior ribs.  IMPRESSION: There has been slight interval improvement in the appearance of the  pulmonary interstitium since yesterday's study. The support tubes and lines are in appropriate position.   Electronically Signed   By: David  [place]   On: 01/30/2014 08:20   Dg Abd Portable 1v  02/27/2014   CLINICAL DATA:  73 year old female status post feeding tube placement. Initial encounter.  EXAM: PORTABLE ABDOMEN - 1 VIEW  COMPARISON:  02/26/2014.  FINDINGS: Portable AP supine view at 0748 hrs. Feeding tube remains looped within the stomach. The weighted tip is effacing distally at the level of the distal body, but is not yet post pyloric. There is adequate slack to allow for post pyloric transit.  Mild motion artifact. Stable bowel gas pattern. Stable surgical clips in the right abdomen. Aortoiliac calcified atherosclerosis noted. Stable visualized osseous structures.  IMPRESSION: Stable feeding tube tip at the distal stomach, not yet post pyloric.   Electronically Signed   By: Augusto Gamble M.D.   On: 02/27/2014 08:02   Dg Abd Portable 1v  02/26/2014   CLINICAL DATA:  Check feeding catheter placement  EXAM: PORTABLE ABDOMEN - 1 VIEW  COMPARISON:  02/25/2014  FINDINGS: Feeding catheter is again seen coiled within the stomach. The tip is directed  towards the pyloric channel. Scattered large and small bowel gas is noted. Prominent Riedel's lobe of the liver is seen.  IMPRESSION: Feeding catheter within the stomach.   Electronically Signed   By: Alcide Clever M.D.   On: 02/26/2014 08:01   Dg Abd Portable 1v  02/25/2014   CLINICAL DATA:  Feeding tube placement  EXAM: PORTABLE ABDOMEN - 1 VIEW  COMPARISON:  Study obtained earlier in the day  FINDINGS: Feeding tube tip is at the level of the gastric pylorus. Bowel gas pattern is unremarkable. Temporary pacemaker leads are attached to the right heart.  IMPRESSION: Feeding tube tip at level of gastric pylorus.   Electronically Signed   By: Bretta Bang M.D.   On: 02/25/2014 16:13   Dg Abd Portable 1v  02/25/2014   CLINICAL DATA:  Feeding tube repositioning.  EXAM: PORTABLE ABDOMEN - 1 VIEW  COMPARISON:  02/23/2014.  FINDINGS: 1533 hr. The feeding tube is looped in the stomach with the tip in the fundal region. The bowel gas pattern is nonobstructive. Cholecystectomy clips are noted. There is elevation of the left hemidiaphragm with left basilar pulmonary atelectasis.  IMPRESSION: Feeding tube is looped in the stomach with the tip in the fundal region.   Electronically Signed   By: Roxy Horseman M.D.   On: 02/25/2014 15:49   Dg Abd Portable 1v  02/10/2014   CLINICAL DATA:  Feeding tube placement  EXAM: PORTABLE ABDOMEN - 1 VIEW  COMPARISON:  Abdominal film of February 09, 2014  FINDINGS: The radiodense tip of the feeding tube lies now in the proximal jejunum. No abnormal bowel loops are demonstrated.  IMPRESSION: The feeding tube tip lies in the region of the proximal jejunum.   Electronically Signed   By: David  [place]   On: 02/10/2014 07:47   Dg Abd Portable 1v  02/09/2014   CLINICAL DATA:  Feeding tube placement  EXAM: PORTABLE ABDOMEN - 1 VIEW  COMPARISON:  February 01, 2014  FINDINGS: Feeding tube tip is below the diaphragm. It is difficult to ascertain whether the tip is in the mid to distal stomach  versus third duodenum. Bowel gas pattern is unremarkable. Temporary pacemaker wires extend to the right heart. There is left pleural effusion.  IMPRESSION: It is difficult to ascertain whether the feeding tube  tip is in the mid to distal stomach versus mid duodenum. If this differentiation will affect patient management, a small contrast injection through the tube with image confirmation could distinguish whether tube tip is in stomach versus duodenum. Bowel gas pattern unremarkable.   Electronically Signed   By: Bretta Bang M.D.   On: 02/09/2014 12:51   Dg Abd Portable 1v  02/01/2014   CLINICAL DATA:  Feeding tube placement. Acute myocardial infarct. Postop from CABG and mitral valve replacement.  EXAM: PORTABLE ABDOMEN - 1 VIEW  COMPARISON:  01/30/2014  FINDINGS: A feeding tube is seen in place with the tip in the distal duodenum near the ligament of Treitz. A nasogastric tube is seen with tip overlying the proximal stomach. The bowel gas pattern is normal.  IMPRESSION: Feeding tube tip in distal duodenum near the ligament of Treitz. Nasogastric tube tip in proximal stomach.   Electronically Signed   By: Myles Rosenthal M.D.   On: 02/01/2014 08:19   Dg Abd Portable 1v  01/30/2014   CLINICAL DATA:  Feeding tube placement.  EXAM: PORTABLE ABDOMEN - 1 VIEW  COMPARISON:  None.  FINDINGS: Feeding tube is in place in looped in the distal stomach with the tip projecting retrograde.  IMPRESSION: As above.   Electronically Signed   By: Drusilla Kanner M.D.   On: 01/30/2014 11:17   Dg Vangie Bicker G Tube Plc W/fl-no Rad  02/27/2014   CLINICAL DATA: need post pyloric panda   NASO G TUBE PLACEMENT WITH FLUORO  Fluoroscopy was utilized by the requesting physician.  No radiographic  interpretation.    Ct Perc Pleural Drain W/indwell Cath W/img Guide  02/09/2014   INDICATION: Post median sternotomy, CABG and mitral valve replacement, now with persistent symptomatic left-sided pleural effusion. Please perform CT-guided  drainage left chest tube placement.  EXAM: CT PERC PLEURAL DRAIN W/INDWELL CATH W/IMG GUIDE  COMPARISON:  Chest CT- earlier same day  MEDICATIONS: The patient is currently admitted to the hospital and receiving intravenous antibiotics. The antibiotics were administered within an appropriate time frame prior to the initiation of the procedure.  ANESTHESIA/SEDATION: None  CONTRAST:  None  COMPLICATIONS: None immediate  PROCEDURE: Informed written consent was obtained from the patient's family after a discussion of the risks, benefits and alternatives to treatment. The patient was placed supine on the CT gantry and a pre procedural CT was performed re-demonstrating the known moderate to large sized left-sided pleural effusion. The procedure was planned. A timeout was performed prior to the initiation of the procedure.  The skin overlying the anterior lateral aspect of the left inferior chest was prepped and draped in the usual sterile fashion. The overlying soft tissues were anesthetized with 1% lidocaine with epinephrine. Appropriate trajectory was planned with the use of a 22 gauge spinal needle. An 18 gauge trocar needle was advanced into the left pleural space and a short Amplatz super stiff wire was coiled within the collection. Appropriate positioning was confirmed with a limited CT scan. The tract was serially dilated allowing placement of a 12 [place] all-purpose drainage catheter. Appropriate positioning was confirmed with a limited postprocedural CT scan.  Approximately 1.3 L Ml of brown, slightly red colored pleural fluid was aspirated. The tube was connected to a drainage bag and sutured in place. A dressing was placed. The patient tolerated the procedure well without immediate post procedural complication.  IMPRESSION: Successful CT guided placement of a 18 French all purpose drain catheter into the left pleural space with aspiration  of approximately 1.3 L of pleural fluid. Samples were sent to the  laboratory as requested by the ordering clinical team.   Electronically Signed   By: Simonne Come M.D.   On: 02/09/2014 16:30    Assessment:   1) CAD  - s/p CABG x 4 with MV replacement  2) A/C respiratory failure  3) AKI  - On CVVHD  4) Cardiogenic shock  5) Afib/A Flutter  6) L Pleural effusion  - s/p L CT  7) Anemia  8) Delerium, acute  9) ? Dementia  10) HCAP  11) RV failure  Plan/Discussion:   Continues to struggle with RV failure and shock. On levophed and milrinone. Co-ox somewhat improved but BP remains soft. Now on HD today. Keep CVP 10-12 with RV failure.   Looks like she is in sinus tachycardia this am - some P wave visible but may be flutter- will check 12 lead EKG.  Continue amio load. Continue heparin. No b-blocker or ACE due to shock and renal failure.   CAD is stable.        [name], MD  02/28/2014  11:40 AM

## 2014-02-28 NOTE — Progress Notes (Signed)
34 Days Post-Op Procedure(s) (LRB): CORONARY ARTERY BYPASS GRAFTING TIMES FOUR USING LEFT INTERNAL MAMMARY ARTERY TO LAD, SAPHENOUS VEIN GRAFTS TO OM1, OM2, AND PDA (N/A) MITRAL VALVE (MV) REPLACEMENT (N/A) Subjective: Alert Tries to respond but non-verbal  Objective: Vital signs in last 24 hours: Temp:  [98.5 F (36.9 C)-99.8 F (37.7 C)] 98.7 F (37.1 C) (08/15 0758) Pulse Rate:  [108-126] 112 (08/15 0730) Cardiac Rhythm:  [-] Atrial flutter (08/14 2130) Resp:  [16-29] 23 (08/15 0730) BP: (74-118)/(29-84) 118/47 mmHg (08/15 0700) SpO2:  [94 %-100 %] 96 % (08/15 0750) Weight:  [188 lb 11.4 oz (85.6 kg)-194 lb 14.2 oz (88.4 kg)] 188 lb 11.4 oz (85.6 kg) (08/15 0530)  Hemodynamic parameters for last 24 hours: CVP:  [11 mmHg-19 mmHg] 14 mmHg  Intake/Output from previous day: 08/14 0701 - 08/15 0700 In: 2590.6 [I.V.:1245.6; Blood:335; NG/GT:1010] Out: 1277 [Stool:300] Intake/Output this shift:    General appearance: alert and no distress Neurologic: alet Heart: tachy , regular Lungs: clear to auscultation bilaterally Abdomen: normal findings: soft, non-tender  Lab Results:  Recent Labs  02/27/14 0410 02/28/14 0248 02/28/14 0548  WBC 12.0* 12.3*  --   HGB 8.0* 8.7* 10.2*  HCT 26.1* 27.9* 30.0*  PLT 215 220  --    BMET:  Recent Labs  02/26/14 1527 02/27/14 0410 02/28/14 0548  NA 137 135* 136*  K 4.4 4.6 3.7  CL 97 97 97  CO2 25 23  --   GLUCOSE 130* 215* 244*  BUN 19 39* 35*  CREATININE 1.13* 2.21* 2.70*  CALCIUM 9.0 9.0  --     PT/INR:  Recent Labs  02/28/14 0248  LABPROT 15.5*  INR 1.23   ABG    Component Value Date/Time   PHART 7.463* 02/17/2014 1013   HCO3 26.6* 02/17/2014 1013   TCO2 23 02/28/2014 0548   ACIDBASEDEF 0.1 01/30/2014 0339   O2SAT 60.3 02/28/2014 0535   CBG (last 3)   Recent Labs  02/27/14 2002 02/27/14 2355 02/28/14 0419  GLUCAP 262* 223* 260*    Assessment/Plan: S/P Procedure(s) (LRB): CORONARY ARTERY BYPASS GRAFTING  TIMES FOUR USING LEFT INTERNAL MAMMARY ARTERY TO LAD, SAPHENOUS VEIN GRAFTS TO OM1, OM2, AND PDA (N/A) MITRAL VALVE (MV) REPLACEMENT (N/A) - CV- stable, mildly tachycardic on milrinone and norepi  Continue coumadin  RESP- good sats on  O2  RENAL- tolerated HD yesterday, to have HD again today  ENDO- CBG elevated in 200s- increase lantus  Nutrition-tolerating tube feeds   LOS: 34 days    Mercedees Convery C 02/28/2014

## 2014-02-28 NOTE — Progress Notes (Signed)
Speech Language Pathology Treatment: Dysphagia  Patient Details Name: [name] MRN: [id number] DOB: Jun 07, 1941 Today's Date: 02/28/2014 Time: 0810-0823 SLP Time Calculation (min): 13 min  Assessment / Plan / Recommendation Clinical Impression  Pt more willing to participate in PO trials this session though she appears to dislike ice, water and applesauce. SLP provided oral care to facilitate oral health and oral sensation of PO trials as pts lingual surface is extremely dry. Pt was able to orally manipulate ice, straw sips of water x1 and 1/2 teaspoon of puree before refusing further trials. No overt evidence of aspiration seen, but pt remains aphonic with significant concern for silent aspiration. Recommend RN initiate ice chips through the day to improve oral hydration and give opportunities for pt to swallow. SLP will f/u on Monday and provide trials of motivating PO (Coke) to determine if pt may be able to participate in objective swallow eval.    HPI HPI: Patient with past medical history of DM, MI, HTN, admitted with MI and underwent emergent CABG x 4 and MVR on 7/13. Post op course complicated by VDRF and bilateral pleural effusions and requiring CVVHD. Extubated 7/31.    Pertinent Vitals    SLP Plan  Continue with current plan of care    Recommendations Diet recommendations: NPO (except ice chips) Medication Administration: Via alternative means Supervision: Full supervision/cueing for compensatory strategies              Oral Care Recommendations: Oral care Q4 per protocol Follow up Recommendations: LTACH;24 hour supervision/assistance Plan: Continue with current plan of care    GO    [facility], MA CCC-SLP [phone]   Claudine Mouton 02/28/2014, 8:26 AM

## 2014-03-01 LAB — GLUCOSE, CAPILLARY
Glucose-Capillary: 171 mg/dL — ABNORMAL HIGH (ref 70–99)
Glucose-Capillary: 199 mg/dL — ABNORMAL HIGH (ref 70–99)
Glucose-Capillary: 202 mg/dL — ABNORMAL HIGH (ref 70–99)
Glucose-Capillary: 220 mg/dL — ABNORMAL HIGH (ref 70–99)
Glucose-Capillary: 230 mg/dL — ABNORMAL HIGH (ref 70–99)
Glucose-Capillary: 256 mg/dL — ABNORMAL HIGH (ref 70–99)

## 2014-03-01 LAB — CBC
HCT: 27.7 % — ABNORMAL LOW (ref 36.0–46.0)
Hemoglobin: 8.6 g/dL — ABNORMAL LOW (ref 12.0–15.0)
MCH: 29.5 pg (ref 26.0–34.0)
MCHC: 31 g/dL (ref 30.0–36.0)
MCV: 94.9 fL (ref 78.0–100.0)
Platelets: 209 10*3/uL (ref 150–400)
RBC: 2.92 MIL/uL — ABNORMAL LOW (ref 3.87–5.11)
RDW: 18.7 % — ABNORMAL HIGH (ref 11.5–15.5)
WBC: 11.6 10*3/uL — ABNORMAL HIGH (ref 4.0–10.5)

## 2014-03-01 LAB — RENAL FUNCTION PANEL
Albumin: 3 g/dL — ABNORMAL LOW (ref 3.5–5.2)
Anion gap: 15 (ref 5–15)
BUN: 34 mg/dL — ABNORMAL HIGH (ref 6–23)
CO2: 26 mEq/L (ref 19–32)
Calcium: 9.3 mg/dL (ref 8.4–10.5)
Chloride: 94 mEq/L — ABNORMAL LOW (ref 96–112)
Creatinine, Ser: 2.25 mg/dL — ABNORMAL HIGH (ref 0.50–1.10)
GFR calc Af Amer: 24 mL/min — ABNORMAL LOW (ref >90)
GFR calc non Af Amer: 20 mL/min — ABNORMAL LOW (ref >90)
Glucose, Bld: 178 mg/dL — ABNORMAL HIGH (ref 70–99)
Phosphorus: 4.5 mg/dL (ref 2.3–4.6)
Potassium: 4.5 mEq/L (ref 3.7–5.3)
Sodium: 135 mEq/L — ABNORMAL LOW (ref 137–147)

## 2014-03-01 LAB — CARBOXYHEMOGLOBIN
Carboxyhemoglobin: 2.4 % — ABNORMAL HIGH (ref 0.5–1.5)
Methemoglobin: 1 % (ref 0.0–1.5)
O2 Saturation: 59.6 %
Total hemoglobin: 8.9 g/dL — ABNORMAL LOW (ref 12.0–16.0)

## 2014-03-01 LAB — MAGNESIUM: Magnesium: 2.1 mg/dL (ref 1.5–2.5)

## 2014-03-01 LAB — HEPARIN LEVEL (UNFRACTIONATED)
Heparin Unfractionated: 0.19 IU/mL — ABNORMAL LOW (ref 0.30–0.70)
Heparin Unfractionated: 0.29 IU/mL — ABNORMAL LOW (ref 0.30–0.70)

## 2014-03-01 LAB — PROTIME-INR
INR: 1.18 (ref 0.00–1.49)
Prothrombin Time: 15 seconds (ref 11.6–15.2)

## 2014-03-01 MED ORDER — INSULIN GLARGINE 100 UNIT/ML ~~LOC~~ SOLN
35.0000 [IU] | Freq: Two times a day (BID) | SUBCUTANEOUS | Status: DC
  Administered 2014-03-01 (×2): 35 [IU] via SUBCUTANEOUS
  Filled 2014-03-01 (×4): qty 0.35

## 2014-03-01 NOTE — Progress Notes (Signed)
ANTICOAGULATION CONSULT NOTE - Follow Up Consult  Pharmacy Consult for Heparin  Indication: atrial fibrillation  No Known Allergies  Patient Measurements: Height: 5\' 7"  (170.2 cm) Weight: 187 lb 9.8 oz (85.1 kg) IBW/kg (Calculated) : 61.6  Vital Signs: Temp: 99.4 F (37.4 C) (08/16 0403) Temp src: Axillary (08/16 0403) BP: 111/59 mmHg (08/16 0330) Pulse Rate: 110 (08/16 0330)  Labs:  Recent Labs  02/27/14 0410 02/27/14 1055 02/28/14 0248 02/28/14 0548 03/01/14 0253  HGB 8.0*  --  8.7* 10.2* 8.6*  HCT 26.1*  --  27.9* 30.0* 27.7*  PLT 215  --  220  --  209  LABPROT 15.5*  --  15.5*  --  15.0  INR 1.23  --  1.23  --  1.18  HEPARINUNFRC  --  0.30 0.30  --  0.19*  CREATININE 2.21*  --   --  2.70* 2.25*    Estimated Creatinine Clearance: 24.6 ml/min (by C-G formula based on Cr of 2.25).   Assessment: 73 y/o F transitioning to [facility] for acute renal failure post CABG.  She is on a heparin drip for prosthetic MVR and afib at 1600 units/hr. Heparin level is below goal range this am, will adjust.   H/H is low but relatively stable. No bleeding noted. HIT negative on 7/15. Other labs as above.   Goal of Therapy:  Heparin level: 0.3-0.5 per Dr. Donata Clay Monitor platelets by anticoagulation protocol: Yes   Plan:  - Increase heparin to 1750 units/hr - Daily heparin level and CBC - Monitor platelets closely and s/s bleeding -Continue Coumadin dosing per MD  Sheppard Coil PharmD., BCPS Clinical Pharmacist Pager [phone] 03/01/2014 4:14 AM

## 2014-03-01 NOTE — Progress Notes (Signed)
35 Days Post-Op Procedure(s) (LRB): CORONARY ARTERY BYPASS GRAFTING TIMES FOUR USING LEFT INTERNAL MAMMARY ARTERY TO LAD, SAPHENOUS VEIN GRAFTS TO OM1, OM2, AND PDA (N/A) MITRAL VALVE (MV) REPLACEMENT (N/A) Subjective: Alert Communicating verbally  Objective: Vital signs in last 24 hours: Temp:  [96.1 F (35.6 C)-100 F (37.8 C)] 98 F (36.7 C) (08/16 0743) Pulse Rate:  [107-126] 108 (08/16 0700) Cardiac Rhythm:  [-] Sinus tachycardia (08/16 0500) Resp:  [9-30] 23 (08/16 0700) BP: (84-131)/(48-84) 119/77 mmHg (08/16 0700) SpO2:  [94 %-100 %] 97 % (08/16 0700) Weight:  [187 lb 9.8 oz (85.1 kg)-189 lb 9.5 oz (86 kg)] 187 lb 9.8 oz (85.1 kg) (08/15 1523)  Hemodynamic parameters for last 24 hours: CVP:  [8 mmHg-17 mmHg] 16 mmHg  Intake/Output from previous day: 08/15 0701 - 08/16 0700 In: 2823.2 [I.V.:1293.2; NG/GT:1530] Out: 1300 [Stool:300] Intake/Output this shift:    General appearance: alert and no distress Neurologic: generally weak, speaking but not following conversation Heart: tachy, regular Lungs: diminished breath sounds bibasilar Abdomen: normal findings: soft, non-tender Wound: clean and dry  Lab Results:  Recent Labs  02/28/14 0248 02/28/14 0548 03/01/14 0253  WBC 12.3*  --  11.6*  HGB 8.7* 10.2* 8.6*  HCT 27.9* 30.0* 27.7*  PLT 220  --  209   BMET:  Recent Labs  02/27/14 0410 02/28/14 0548 03/01/14 0253  NA 135* 136* 135*  K 4.6 3.7 4.5  CL 97 97 94*  CO2 23  --  26  GLUCOSE 215* 244* 178*  BUN 39* 35* 34*  CREATININE 2.21* 2.70* 2.25*  CALCIUM 9.0  --  9.3    PT/INR:  Recent Labs  03/01/14 0253  LABPROT 15.0  INR 1.18   ABG    Component Value Date/Time   PHART 7.463* 02/17/2014 1013   HCO3 26.6* 02/17/2014 1013   TCO2 23 02/28/2014 0548   ACIDBASEDEF 0.1 01/30/2014 0339   O2SAT 59.6 03/01/2014 0445   CBG (last 3)   Recent Labs  02/28/14 1950 02/28/14 2356 03/01/14 0351  GLUCAP 218* 220* 199*    Assessment/Plan: S/P  Procedure(s) (LRB): CORONARY ARTERY BYPASS GRAFTING TIMES FOUR USING LEFT INTERNAL MAMMARY ARTERY TO LAD, SAPHENOUS VEIN GRAFTS TO OM1, OM2, AND PDA (N/A) MITRAL VALVE (MV) REPLACEMENT (N/A) - CV- stable on milrinone and levophed- CVP= 15, co-ox= 59  Coumadin started- INR has not bumped, continue heparin drip until INR therapeutic  RESP- sats high 90s on room air  RENAL- ARF- HD yesterday 1 liter taken off  Nutrition- tolerating goal TF  DM- CBG better but still around 200- increase lantus to 35 BID   LOS: 35 days    Eldrick Penick C 03/01/2014

## 2014-03-01 NOTE — Progress Notes (Signed)
Subjective: Interval History: has complaints ,not sure how she got here.  Objective: Vital signs in last 24 hours: Temp:  [96.1 F (35.6 C)-100 F (37.8 C)] 98 F (36.7 C) (08/16 0743) Pulse Rate:  [107-126] 108 (08/16 0700) Resp:  [9-30] 23 (08/16 0700) BP: (84-131)/(48-84) 119/77 mmHg (08/16 0700) SpO2:  [94 %-100 %] 97 % (08/16 0700) Weight:  [85.1 kg (187 lb 9.8 oz)-86 kg (189 lb 9.5 oz)] 85.1 kg (187 lb 9.8 oz) (08/15 1523) Weight change: -2.4 kg (-5 lb 4.7 oz)  Intake/Output from previous day: 08/15 0701 - 08/16 0700 In: 2823.2 [I.V.:1293.2; NG/GT:1530] Out: 1300 [Stool:300] Intake/Output this shift:    General appearance: cooperative and confused Neck: L IJ cath Resp: rales LLL Cardio: irregularly irregular rhythm, S1, S2 normal and systolic murmur: holosystolic 2/6, blowing at apex GI: pos bs,liver down 6 cm, soft Extremities: edema 2 +  Lab Results:  Recent Labs  02/28/14 0248 02/28/14 0548 03/01/14 0253  WBC 12.3*  --  11.6*  HGB 8.7* 10.2* 8.6*  HCT 27.9* 30.0* 27.7*  PLT 220  --  209   BMET:  Recent Labs  02/27/14 0410 02/28/14 0548 03/01/14 0253  NA 135* 136* 135*  K 4.6 3.7 4.5  CL 97 97 94*  CO2 23  --  26  GLUCOSE 215* 244* 178*  BUN 39* 35* 34*  CREATININE 2.21* 2.70* 2.25*  CALCIUM 9.0  --  9.3   No results found for this basename: PTH,  in the last 72 hours Iron Studies: No results found for this basename: IRON, TIBC, TRANSFERRIN, FERRITIN,  in the last 72 hours  Studies/Results: Dg Abd 1 View  02/27/2014   CLINICAL DATA:  Feeding tube placement  EXAM: ABDOMEN - 1 VIEW  COMPARISON:  Abdominal film of 27 February 2014 at 7:48 a.m.  FINDINGS: The feeding tube has been repositioned or replaced. The current tube tip lies in the left upper quadrant of the abdomen and appears to lie within the proximal jejunum. There is contrast present which outlines a small bowel mucosal pattern consistent with jejunum.  IMPRESSION: The feeding tube tip now  lies in the proximal jejunum.   Electronically Signed   By: David  [place]   On: 02/27/2014 17:08   Dg Chest Port 1 View  02/28/2014   CLINICAL DATA:  CABG.  EXAM: PORTABLE CHEST - 1 VIEW  COMPARISON:  02/27/2014.  FINDINGS: The heart remains enlarged. Prior CABG and valve replacement. There is improved vascular congestion. Tubes and lines remain stable. No pneumothorax. Mild retrocardiac density, slight subsegmental atelectasis, also improved.  IMPRESSION: Improved vascular congestion.  No new findings.   Electronically Signed   By: Davonna Belling M.D.   On: 02/28/2014 08:08   Dg Vangie Bicker G Tube Plc W/fl-no Rad  02/27/2014   CLINICAL DATA: need post pyloric panda   NASO G TUBE PLACEMENT WITH FLUORO  Fluoroscopy was utilized by the requesting physician.  No radiographic  interpretation.     I have reviewed the patient's current medications.  Assessment/Plan: 1  AKI oliguric.  Did fair on HD, will try in am again with little more vol off 2 DM controlled 3 CABG/MVR  Per CTS 4 CM/Afib per cards 5 Nutrition  TF 6 DM controlled 7 Confusion/immobility P HD in am , TF, change to PC this week.     LOS: 35 days   Kynsleigh Westendorf L 03/01/2014,8:36 AM

## 2014-03-01 NOTE — Progress Notes (Signed)
Resting comfortably  No change in mental status  BP 130/67  Pulse 112  Temp(Src) 97.9 F (36.6 C) (Oral)  Resp 30  Ht 5\' 7"  (1.702 m)  Wt 187 lb 9.8 oz (85.1 kg)  BMI 29.38 kg/m2  SpO2 96%   Intake/Output Summary (Last 24 hours) at 03/01/14 1933 Last data filed at 03/01/14 1900  Gross per 24 hour  Intake 2230.56 ml  Output    500 ml  Net 1730.56 ml

## 2014-03-01 NOTE — Progress Notes (Signed)
ANTICOAGULATION CONSULT NOTE - Follow Up Consult  Pharmacy Consult for Heparin  Indication: atrial fibrillation  No Known Allergies  Patient Measurements: Height: 5\' 7"  (170.2 cm) Weight: 187 lb 9.8 oz (85.1 kg) IBW/kg (Calculated) : 61.6  Vital Signs: Temp: 97.8 F (36.6 C) (08/16 1247) Temp src: Oral (08/16 1247) BP: 115/65 mmHg (08/16 1100) Pulse Rate: 106 (08/16 1100)  Labs:  Recent Labs  02/27/14 0410  02/28/14 0248 02/28/14 0548 03/01/14 0253 03/01/14 1200  HGB 8.0*  --  8.7* 10.2* 8.6*  --   HCT 26.1*  --  27.9* 30.0* 27.7*  --   PLT 215  --  220  --  209  --   LABPROT 15.5*  --  15.5*  --  15.0  --   INR 1.23  --  1.23  --  1.18  --   HEPARINUNFRC  --   < > 0.30  --  0.19* 0.29*  CREATININE 2.21*  --   --  2.70* 2.25*  --   < > = values in this interval not displayed.  Estimated Creatinine Clearance: 24.6 ml/min (by C-G formula based on Cr of 2.25).   Assessment: 73 y/o F transitioning to [facility] for acute renal failure post CABG.  She is on a heparin drip for prosthetic MVR and afib at 1750 units/hr. Heparin level is near goal range.   H/H is low but relatively stable.   No bleeding noted. HIT negative on 7/15. Other labs as above.   Goal of Therapy:  Heparin level: 0.3-0.5 per Dr. Donata Clay Monitor platelets by anticoagulation protocol: Yes   Plan:  - Increase heparin to 1800 units/hr. - Daily heparin level and CBC - Monitor platelets closely and s/s bleeding -Continue Coumadin dosing per MD  Dixie Dials, Pharm.D., BCPS Clinical Pharmacist Pager [phone] 03/01/2014 1:30 PM

## 2014-03-02 ENCOUNTER — Inpatient Hospital Stay (HOSPITAL_COMMUNITY): Payer: Medicare Other

## 2014-03-02 ENCOUNTER — Encounter (HOSPITAL_COMMUNITY): Payer: Self-pay | Admitting: Radiology

## 2014-03-02 LAB — MAGNESIUM: Magnesium: 2 mg/dL (ref 1.5–2.5)

## 2014-03-02 LAB — GLUCOSE, CAPILLARY
Glucose-Capillary: 222 mg/dL — ABNORMAL HIGH (ref 70–99)
Glucose-Capillary: 214 mg/dL — ABNORMAL HIGH (ref 70–99)
Glucose-Capillary: 127 mg/dL — ABNORMAL HIGH (ref 70–99)
Glucose-Capillary: 105 mg/dL — ABNORMAL HIGH (ref 70–99)
Glucose-Capillary: 187 mg/dL — ABNORMAL HIGH (ref 70–99)
Glucose-Capillary: 149 mg/dL — ABNORMAL HIGH (ref 70–99)

## 2014-03-02 LAB — CBC
HCT: 26.6 % — ABNORMAL LOW (ref 36.0–46.0)
Hemoglobin: 8.4 g/dL — ABNORMAL LOW (ref 12.0–15.0)
MCH: 29.4 pg (ref 26.0–34.0)
MCHC: 31.6 g/dL (ref 30.0–36.0)
MCV: 93 fL (ref 78.0–100.0)
Platelets: 233 10*3/uL (ref 150–400)
RBC: 2.86 MIL/uL — ABNORMAL LOW (ref 3.87–5.11)
RDW: 18 % — ABNORMAL HIGH (ref 11.5–15.5)
WBC: 12.2 10*3/uL — ABNORMAL HIGH (ref 4.0–10.5)

## 2014-03-02 LAB — COMPREHENSIVE METABOLIC PANEL
ALT: 14 U/L (ref 0–35)
AST: 23 U/L (ref 0–37)
Albumin: 2.8 g/dL — ABNORMAL LOW (ref 3.5–5.2)
Alkaline Phosphatase: 87 U/L (ref 39–117)
Anion gap: 20 — ABNORMAL HIGH (ref 5–15)
BUN: 65 mg/dL — ABNORMAL HIGH (ref 6–23)
CO2: 22 mEq/L (ref 19–32)
Calcium: 9 mg/dL (ref 8.4–10.5)
Chloride: 90 mEq/L — ABNORMAL LOW (ref 96–112)
Creatinine, Ser: 3.45 mg/dL — ABNORMAL HIGH (ref 0.50–1.10)
GFR calc Af Amer: 14 mL/min — ABNORMAL LOW (ref >90)
GFR calc non Af Amer: 12 mL/min — ABNORMAL LOW (ref >90)
Glucose, Bld: 175 mg/dL — ABNORMAL HIGH (ref 70–99)
Potassium: 4.1 mEq/L (ref 3.7–5.3)
Sodium: 132 mEq/L — ABNORMAL LOW (ref 137–147)
Total Bilirubin: 0.6 mg/dL (ref 0.3–1.2)
Total Protein: 7.4 g/dL (ref 6.0–8.3)

## 2014-03-02 LAB — CARBOXYHEMOGLOBIN
Carboxyhemoglobin: 2 % — ABNORMAL HIGH (ref 0.5–1.5)
Methemoglobin: 0.7 % (ref 0.0–1.5)
O2 Saturation: 69.1 %
Total hemoglobin: 8 g/dL — ABNORMAL LOW (ref 12.0–16.0)

## 2014-03-02 LAB — HEPATITIS B SURFACE ANTIBODY,QUALITATIVE: Hep B S Ab: NEGATIVE

## 2014-03-02 LAB — PROTIME-INR
INR: 1.22 (ref 0.00–1.49)
Prothrombin Time: 15.4 seconds — ABNORMAL HIGH (ref 11.6–15.2)

## 2014-03-02 LAB — PHOSPHORUS: Phosphorus: 6.6 mg/dL — ABNORMAL HIGH (ref 2.3–4.6)

## 2014-03-02 LAB — HEPARIN LEVEL (UNFRACTIONATED): Heparin Unfractionated: 0.54 IU/mL (ref 0.30–0.70)

## 2014-03-02 MED ORDER — SODIUM CHLORIDE 0.9 % IV SOLN: 100.0000 mL | INTRAVENOUS | Status: DC | PRN

## 2014-03-02 MED ORDER — NEPRO/CARBSTEADY PO LIQD
237.0000 mL | ORAL | Status: DC | PRN
  Filled 2014-03-02: qty 237

## 2014-03-02 MED ORDER — PRO-STAT SUGAR FREE PO LIQD: 30.0000 mL | Freq: Four times a day (QID) | ORAL | Status: DC

## 2014-03-02 MED ORDER — HEPARIN SODIUM (PORCINE) 1000 UNIT/ML DIALYSIS: 1000.0000 [IU] | INTRAMUSCULAR | Status: DC | PRN

## 2014-03-02 MED ORDER — WARFARIN SODIUM 4 MG PO TABS
4.0000 mg | ORAL_TABLET | Freq: Every day | ORAL | Status: DC
  Administered 2014-03-02 – 2014-03-03 (×2): 4 mg via ORAL
  Filled 2014-03-02 (×3): qty 1

## 2014-03-02 MED ORDER — PENTAFLUOROPROP-TETRAFLUOROETH EX AERO: 1.0000 "application " | INHALATION_SPRAY | CUTANEOUS | Status: DC | PRN

## 2014-03-02 MED ORDER — LIDOCAINE HCL (PF) 1 % IJ SOLN: 5.0000 mL | INTRAMUSCULAR | Status: DC | PRN

## 2014-03-02 MED ORDER — IOHEXOL 300 MG/ML  SOLN
50.0000 mL | Freq: Once | INTRAMUSCULAR | Status: AC | PRN
  Administered 2014-03-02: 20 mL

## 2014-03-02 MED ORDER — NEPRO/CARBSTEADY PO LIQD: 1000.0000 mL | ORAL | Status: DC

## 2014-03-02 MED ORDER — INSULIN GLARGINE 100 UNIT/ML ~~LOC~~ SOLN
25.0000 [IU] | Freq: Two times a day (BID) | SUBCUTANEOUS | Status: DC
  Administered 2014-03-02 (×2): 25 [IU] via SUBCUTANEOUS
  Filled 2014-03-02 (×4): qty 0.25

## 2014-03-02 MED ORDER — NOREPINEPHRINE BITARTRATE 1 MG/ML IV SOLN
2.0000 ug/min | INTRAVENOUS | Status: DC
  Administered 2014-03-02: 7 ug/min via INTRAVENOUS
  Administered 2014-03-04: 3 ug/min via INTRAVENOUS
  Administered 2014-03-05: 2 ug/min via INTRAVENOUS
  Filled 2014-03-02 (×4): qty 16

## 2014-03-02 MED ORDER — DARBEPOETIN ALFA-POLYSORBATE 60 MCG/0.3ML IJ SOLN
60.0000 ug | INTRAMUSCULAR | Status: DC
Start: 2014-03-02 — End: 2014-03-06
  Administered 2014-03-02: 60 ug via INTRAVENOUS
  Filled 2014-03-02: qty 0.3

## 2014-03-02 MED ORDER — NEPRO/CARBSTEADY PO LIQD
237.0000 mL | ORAL | Status: DC | PRN
Start: 2014-03-02 — End: 2014-03-02

## 2014-03-02 MED ORDER — LIDOCAINE-PRILOCAINE 2.5-2.5 % EX CREA: 1.0000 "application " | TOPICAL_CREAM | CUTANEOUS | Status: DC | PRN

## 2014-03-02 MED ORDER — ALTEPLASE 2 MG IJ SOLR
2.0000 mg | Freq: Once | INTRAMUSCULAR | Status: AC | PRN
  Filled 2014-03-02: qty 2

## 2014-03-02 MED ORDER — HEPARIN SODIUM (PORCINE) 1000 UNIT/ML DIALYSIS
100.0000 [IU]/kg | INTRAMUSCULAR | Status: DC | PRN
  Filled 2014-03-02: qty 9

## 2014-03-02 MED ORDER — NEPRO/CARBSTEADY PO LIQD
1000.0000 mL | ORAL | Status: DC
  Administered 2014-03-02 – 2014-03-03 (×2): 1000 mL via ORAL
  Filled 2014-03-02 (×5): qty 1000

## 2014-03-02 NOTE — Procedures (Addendum)
Patient was seen on dialysis and the procedure was supervised. BFR 400 Via L IJ TRialysis cath BP is 117/60.  Patient appears to be tolerating treatment well.  Pt developed an increase in HR/arrythmia, will switch to 4K bath.

## 2014-03-02 NOTE — Progress Notes (Signed)
36 Days Post-Op Procedure(s) (LRB): CORONARY ARTERY BYPASS GRAFTING TIMES FOUR USING LEFT INTERNAL MAMMARY ARTERY TO LAD, SAPHENOUS VEIN GRAFTS TO OM1, OM2, AND PDA (N/A) MITRAL VALVE (MV) REPLACEMENT (N/A) Subjective: Although patient remains critically ill she is showing signs of improvement--improved mental status, improvement with her swallowing pending FEES study, was out of bed to the chair this weekend, and she is tolerating hemodialysis.  Referral to L. tach in progress. Chest x-ray today is with increased interstitial edema-hemodialysis is scheduled  Objective: Vital signs in last 24 hours: Temp:  [97.5 F (36.4 C)-99.1 F (37.3 C)] 97.5 F (36.4 C) (08/17 1130) Pulse Rate:  [95-117] 102 (08/17 1415) Cardiac Rhythm:  [-] Sinus tachycardia (08/17 1100) Resp:  [20-33] 25 (08/17 1415) BP: (83-162)/(41-91) 124/57 mmHg (08/17 1415) SpO2:  [93 %-98 %] 96 % (08/17 1415) Weight:  [188 lb 0.8 oz (85.3 kg)-192 lb 14.4 oz (87.5 kg)] 192 lb 14.4 oz (87.5 kg) (08/17 1045)  Hemodynamic parameters for last 24 hours: CVP:  [8 mmHg-28 mmHg] 11 mmHg  Intake/Output from previous day: 08/16 0701 - 08/17 0700 In: 2172.4 [I.V.:1202.4; NG/GT:970] Out: 600 [Stool:600] Intake/Output this shift: Total I/O In: 313.9 [I.V.:253.9; NG/GT:60] Out: -   Alert and responsive in bed receiving Ht Incision is well-healed Extremities warm  Lab Results:  Recent Labs  03/01/14 0253 03/02/14 0259  WBC 11.6* 12.2*  HGB 8.6* 8.4*  HCT 27.7* 26.6*  PLT 209 233   BMET:  Recent Labs  03/01/14 0253 03/02/14 0259  NA 135* 132*  K 4.5 4.1  CL 94* 90*  CO2 26 22  GLUCOSE 178* 175*  BUN 34* 65*  CREATININE 2.25* 3.45*  CALCIUM 9.3 9.0    PT/INR:  Recent Labs  03/02/14 0259  LABPROT 15.4*  INR 1.22   ABG    Component Value Date/Time   PHART 7.463* 02/17/2014 1013   HCO3 26.6* 02/17/2014 1013   TCO2 23 02/28/2014 0548   ACIDBASEDEF 0.1 01/30/2014 0339   O2SAT 69.1 03/02/2014 0500   CBG  (last 3)   Recent Labs  03/02/14 0125 03/02/14 0742 03/02/14 1149  GLUCAP 222* 214* 127*    Assessment/Plan: S/P Procedure(s) (LRB): CORONARY ARTERY BYPASS GRAFTING TIMES FOUR USING LEFT INTERNAL MAMMARY ARTERY TO LAD, SAPHENOUS VEIN GRAFTS TO OM1, OM2, AND PDA (N/A) MITRAL VALVE (MV) REPLACEMENT (N/A) Continue Coumadin daily dosing so that IV heparin can be weaned off-indications atrial fibrillation Continue to wean norepinephrine as tolerated Reassess swallowing function   LOS: 36 days    VAN TRIGT III,Elizibeth Breau 03/02/2014

## 2014-03-02 NOTE — Progress Notes (Signed)
Speech Language Pathology Treatment: Dysphagia  Patient Details Name: [name] MRN: [id number] DOB: Jul 04, 1941 Today's Date: 03/02/2014 Time: 0233-4356 SLP Time Calculation (min): 15 min  Assessment / Plan / Recommendation Clinical Impression  Brief assessment to determine pt readiness for objective assessment. Pt is fully alert, participatory with oral care and follow commands to clear airway with a strong cough. Pt now demonstrates sensation of aspiration with a small sip of thin liquids, a good sign for airway recovery. Pt has very good potential for a modified diet, but will need a FEES to know best textures. Today, pt will have HD, so will plan for FEES tomorrow. Dr. Donata Clay in agreement with plan.    HPI HPI: Patient with past medical history of DM, MI, HTN, admitted with MI and underwent emergent CABG x 4 and MVR on 7/13. Post op course complicated by VDRF and bilateral pleural effusions and requiring CVVHD. Extubated 7/31.    Pertinent Vitals Pain Assessment: No/denies pain  SLP Plan   (FEES tomorrow)    Recommendations Diet recommendations: NPO;Other(comment) (ice chips) Medication Administration: Via alternative means Supervision: Full supervision/cueing for compensatory strategies              Oral Care Recommendations: Oral care Q4 per protocol Follow up Recommendations: LTACH;24 hour supervision/assistance Plan:  (FEES tomorrow)    GO    [name], MA CCC-SLP [phone]  Claudine Mouton 03/02/2014, 9:12 AM

## 2014-03-02 NOTE — Progress Notes (Signed)
Patient ID: [name], female   DOB: 02-08-1941, 73 y.o.   MRN: [id number] S:awake/alert, no complaints. O:BP 108/62  Pulse 105  Temp(Src) 97.9 F (36.6 C) (Oral)  Resp 20  Ht 5\' 7"  (1.702 m)  Wt 85.3 kg (188 lb 0.8 oz)  BMI 29.45 kg/m2  SpO2 95%  Intake/Output Summary (Last 24 hours) at 03/02/14 0925 Last data filed at 03/02/14 0700  Gross per 24 hour  Intake 1985.96 ml  Output    600 ml  Net 1385.96 ml   Intake/Output: I/O last 3 completed shifts: In: 3295.9 [I.V.:1845.9; NG/GT:1450] Out: 800 [Stool:800]  Intake/Output this shift:    Weight change:  General appearance: WD obese WF in NAD, cooperative and confused  Neck: L IJ cath (trialysis) Resp: decreased BS at bases Cardio: irregularly irregular rhythm, II/VI holosystolic murmur at apex  GI: +BS, soft, NT Extremities: edema +trace    Recent Labs Lab 02/24/14 0400  02/25/14 0433  02/25/14 1600 02/26/14 0410 02/26/14 1527 02/27/14 0410 02/28/14 0548 03/01/14 0253 03/02/14 0259  NA 135*  < > 135*  < > 135* 135*  136* 137 135* 136* 135* 132*  K 4.4  < > 4.9  < > 4.5 4.7  4.8 4.4 4.6 3.7 4.5 4.1  CL 97  < > 96  < > 96 97  98 97 97 97 94* 90*  CO2 25  < > 25  --  25 26  26 25 23   --  26 22  GLUCOSE 171*  < > 198*  < > 172* 116*  117* 130* 215* 244* 178* 175*  BUN 21  < > 24*  < > 23 23  23 19  39* 35* 34* 65*  CREATININE 1.02  < > 1.00  < > 0.92 0.95  0.95 1.13* 2.21* 2.70* 2.25* 3.45*  ALBUMIN 3.3*  < > 3.8  --  3.4* 3.5  3.4* 3.2* 3.1*  --  3.0* 2.8*  CALCIUM 9.0  < > 9.2  --  8.8 8.9  9.0 9.0 9.0  --  9.3 9.0  PHOS 2.3  < > 2.1*  --  3.3 2.7  2.6 2.3 3.8  --  4.5 6.6*  AST 30  --   --   --   --  29  --   --   --   --  23  ALT 21  --   --   --   --  17  --   --   --   --  14  < > = values in this interval not displayed. Liver Function Tests:  Recent Labs Lab 02/24/14 0400  02/26/14 0410  02/27/14 0410 03/01/14 0253 03/02/14 0259  AST 30  --  29  --   --   --  23  ALT 21  --  17  --   --    --  14  ALKPHOS 91  --  89  --   --   --  87  BILITOT 1.0  --  1.2  --   --   --  0.6  PROT 8.7*  --  8.5*  --   --   --  7.4  ALBUMIN 3.3*  < > 3.5  3.4*  < > 3.1* 3.0* 2.8*  < > = values in this interval not displayed. No results found for this basename: LIPASE, AMYLASE,  in the last 168 hours No results found for this basename: AMMONIA,  in the last  168 hours CBC:  Recent Labs Lab 02/26/14 0410 02/27/14 0410 02/28/14 0248 02/28/14 0548 03/01/14 0253 03/02/14 0259  WBC 12.9* 12.0* 12.3*  --  11.6* 12.2*  HGB 8.4* 8.0* 8.7* 10.2* 8.6* 8.4*  HCT 27.8* 26.1* 27.9* 30.0* 27.7* 26.6*  MCV 97.9 99.6 95.2  --  94.9 93.0  PLT 200 215 220  --  209 233   Cardiac Enzymes: No results found for this basename: CKTOTAL, CKMB, CKMBINDEX, TROPONINI,  in the last 168 hours CBG:  Recent Labs Lab 03/01/14 1246 03/01/14 1553 03/01/14 2017 03/02/14 0125 03/02/14 0742  GLUCAP 256* 171* 230* 222* 214*    Iron Studies: No results found for this basename: IRON, TIBC, TRANSFERRIN, FERRITIN,  in the last 72 hours Studies/Results: Dg Chest Port 1 View  03/02/2014   CLINICAL DATA:  Interstitial edema  EXAM: PORTABLE CHEST - 1 VIEW  COMPARISON:  February 28, 2014  FINDINGS: There is interstitial edema diffusely, slightly increased compared to most recent prior study. There is some airspace consolidation in the left base behind the left heart. There is cardiomegaly with pulmonary venous hypertension. Central catheter tip is in the superior vena cava. Feeding tube tip extends below the diaphragm. No pneumothorax.  IMPRESSION: Evidence of congestive heart failure increased interstitial edema compared to most recent prior study. Airspace consolidation behind the left heart may represent alveolar edema or superimposed pneumonia. No change in cardiac silhouette.   Electronically Signed   By: Bretta Bang M.D.   On: 03/02/2014 07:12   Dg Abd Portable 1v  03/02/2014   CLINICAL DATA:  Feeding tube  placement  EXAM: PORTABLE ABDOMEN - 1 VIEW  COMPARISON:  February 27, 2014  FINDINGS: Feeding tube tip is in the proximal body of the stomach region. Bowel gas pattern is normal. Temporary pacemaker wires are attached to the right heart.  IMPRESSION: Feeding tube tip in proximal body of stomach.   Electronically Signed   By: Bretta Bang M.D.   On: 03/02/2014 07:34   . aspirin  324 mg Per Tube Daily  . bisacodyl  10 mg Oral Daily   Or  . bisacodyl  10 mg Rectal Daily  . chlorhexidine  15 mL Mouth Rinse BID  . Gerhardt's butt cream   Topical TID  . insulin aspart  0-24 Units Subcutaneous 6 times per day  . insulin glargine  25 Units Subcutaneous BID  . levothyroxine  75 mcg Per Tube QAC breakfast  . midodrine  10 mg Oral TID WC  . pantoprazole sodium  40 mg Per Tube Q1200  . potassium & sodium phosphates  1 packet Per Tube TID  . sodium chloride  10-40 mL Intracatheter Q12H  . warfarin  4 mg Oral q1800  . Warfarin - Physician Dosing Inpatient   Does not apply q1800    BMET    Component Value Date/Time   NA 132* 03/02/2014 0259   K 4.1 03/02/2014 0259   CL 90* 03/02/2014 0259   CO2 22 03/02/2014 0259   GLUCOSE 175* 03/02/2014 0259   BUN 65* 03/02/2014 0259   CREATININE 3.45* 03/02/2014 0259   CALCIUM 9.0 03/02/2014 0259   GFRNONAA 12* 03/02/2014 0259   GFRAA 14* 03/02/2014 0259   CBC    Component Value Date/Time   WBC 12.2* 03/02/2014 0259   RBC 2.86* 03/02/2014 0259   HGB 8.4* 03/02/2014 0259   HCT 26.6* 03/02/2014 0259   PLT 233 03/02/2014 0259   MCV 93.0 03/02/2014 0259   MCH  29.4 03/02/2014 0259   MCHC 31.6 03/02/2014 0259   RDW 18.0* 03/02/2014 0259   LYMPHSABS 2.7 01/26/2014 1030   MONOABS 1.0 01/26/2014 1030   EOSABS 0.0 01/26/2014 1030   BASOSABS 0.0 01/26/2014 [id number]   [age] year old admitted 7/12 with code STEMI underwent cath and CABG x 4 V and MVR 7/13 requiring ballon pump. Complicated by pulmonary HTN and s/p R thoracostomy for pleural effusion. She also had cardiogenic shock  that requires pressor treatment and was started on CVVHD on 02/11/14 transitioned to IHD on 02/27/14.   Assessment/Plan:  1. AKI, oliguric and dialysis-dependent since 02/11/14.  Transitioned from CVVHD to IHD 02/27/14 and second on 02/28/14.  For HD again today.  Volume status has improved.  Cont to follow closely but will likely require 4 HD sessions/week given ongoing issues with hypotension and need for pressors. 2. Vascular access will need tunneled HD catheter.  Will consult [facility] for placement and evaluation for longterm vascular access although will be an issue while she is on coumadin. 3. CAD s/p CABG- still with hypotension requiring pressors 4. Cardiogenic shock/RV failure- cards following 5. Afib/flutter- on coumadin 6. 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  improved 7. Anemia- ABLA/acute illness and AKI.  Will start Aranesp this week 8. AMS- improved per nsg. 9.   Shaan Rhoads A

## 2014-03-02 NOTE — Progress Notes (Signed)
ANTICOAGULATION CONSULT NOTE - Follow Up Consult  Pharmacy Consult for Heparin  Indication: atrial fibrillation  No Known Allergies  Patient Measurements: Height: 5\' 7"  (170.2 cm) Weight: 192 lb 14.4 oz (87.5 kg) IBW/kg (Calculated) : 61.6  Vital Signs: Temp: 97.5 F (36.4 C) (08/17 1130) Temp src: Oral (08/17 1130) BP: 106/69 mmHg (08/17 1215) Pulse Rate: 111 (08/17 1230)  Labs:  Recent Labs  02/28/14 0248 02/28/14 0548 03/01/14 0253 03/01/14 1200 03/02/14 0259  HGB 8.7* 10.2* 8.6*  --  8.4*  HCT 27.9* 30.0* 27.7*  --  26.6*  PLT 220  --  209  --  233  LABPROT 15.5*  --  15.0  --  15.4*  INR 1.23  --  1.18  --  1.22  HEPARINUNFRC 0.30  --  0.19* 0.29* 0.54  CREATININE  --  2.70* 2.25*  --  3.45*    Estimated Creatinine Clearance: 16.3 ml/min (by C-G formula based on Cr of 3.45).   Assessment: 73 y/o F transitioning to [facility] for acute renal failure post CABG.  She is on a heparin drip for prosthetic MVR and afib at 1800 units/hr. Heparin level is near goal range at 0.54. H/H is low but relatively stable.   No bleeding noted. HIT negative on 7/15. Other labs as above.   Goal of Therapy:  Heparin level: 0.3-0.5 per Dr. Donata Clay Monitor platelets by anticoagulation protocol: Yes   Plan:  - Continue heparin at 1800 units/hr. - Daily heparin level and CBC - Monitor platelets closely and s/s bleeding -Continue Coumadin dosing per MD  Margie Billet, PharmD Clinical Pharmacist - Resident Pager: [phone] Pharmacy: [phone] 03/02/2014 12:31 PM

## 2014-03-02 NOTE — Progress Notes (Signed)
Advanced Heart Failure Rounding Note   Subjective:    [name] is a 73 yo female with a history of CAD s/p PCI at [facility], HTN, TIA, DM2 and PAD. She presented to [facility] 01/25/14 with CP,Vtach and ST changes in inferior leads and was taken to the cath lab. She had severe multivessel CAD involving 100% distal RCA with tandem 90% & 80% lesions, proximal D1 100%, proximal LAD & AVGroove Circ 90% with 80% OM2 and was evaluated by Dr. Maren Beach for CABG and IABP was placed.  Underwent emergent CABGx4 and MV replacement (LIMA-LAD, SVG to OM1, SVG to OM2 and SVG to PDA) on 01/26/14. She was transferred to the ICU on multiple pressors and IABP. UOP was sluggish and remained volume overloaded and lasix gtt was started. IABP removed 7/17. TF started for nutrition. Had difficulty with thrombocytopenia which improved and HIT studies negative and Coumadin was restarted. Underwent thoracentesis on R on 02/06/14. Extubated 7/25 but re-intubated 7/27 electively for therapeutic bronchoscopy showing no significant mucus, airways clear w/ exception of what appeared to be suction trauma at the inlet of the RUL. Underwent L CT by IR on 7/27. Her Creatinine was stable for awhile and then started trending up even on milrinone. Underwent HD cath placement 7/28 and started on CVVHD on 7/29. Extubated again 02/13/14. Has had issues with Afib/Aflutter.   ECHO 02/17/14. Reviewed personally EF 35-40% Inferior wall out. RV moderately HK with TAPSE 1.18cm  Remains on CVVHD. Underwent DC-CV yesterday but reverted to AF soon after. Amio load ongoing.  Much less confused. Tolerating HD. Co-ox markedly improved.  Weaning pressors. Remains on levophed 10 and milrinone 0.25. CVP 9. Remains anuric.   Still in AFL.   Objective:   Weight Range:  Vital Signs:   Temp:  [97.8 F (36.6 C)-99.1 F (37.3 C)] 97.9 F (36.6 C) (08/17 0700) Pulse Rate:  [95-117] 108 (08/17 1000) Resp:  [20-33] 26 (08/17 1000) BP: (83-137)/(41-98) 117/66 mmHg (08/17  1000) SpO2:  [93 %-97 %] 95 % (08/17 1000) Weight:  [85.3 kg (188 lb 0.8 oz)] 85.3 kg (188 lb 0.8 oz) (08/17 0730) Last BM Date: 03/02/14  Weight change: Filed Weights   02/28/14 1200 02/28/14 1523 03/02/14 0730  Weight: 86 kg (189 lb 9.5 oz) 85.1 kg (187 lb 9.8 oz) 85.3 kg (188 lb 0.8 oz)    Intake/Output:   Intake/Output Summary (Last 24 hours) at 03/02/14 1058 Last data filed at 03/02/14 1000  Gross per 24 hour  Intake 2107.26 ml  Output    600 ml  Net 1507.26 ml     Physical Exam: CVP 10 with prominent CV waves General:  Chronically ill appearing. No resp difficulty confused HEENT: normal; Panda intact; L IJ HD cath Neck: supple. JVP 10 Carotids 2+ bilat; no bruits. No lymphadenopathy or thryomegaly appreciated. Cor: PMI nondisplaced. Regular rate & irregular rhythm. No rubs, gallops or murmurs. Lungs: Diminished in the bases Abdomen: soft, nontender, nondistended. No hepatosplenomegaly. No bruits or masses. Good bowel sounds. Extremities: no cyanosis, clubbing, rash, no edema; R 3L PICC Neuro: alert. Working with [employer] all 4 extremities w/o difficulty. Affect flat   Telemetry: Afib/flutter 90-120s  Labs: Basic Metabolic Panel:  Recent Labs Lab 02/26/14 0410 02/26/14 1527 02/27/14 0410 02/28/14 0248 02/28/14 0548 03/01/14 0253 03/02/14 0259  NA 135*  136* 137 135*  --  136* 135* 132*  K 4.7  4.8 4.4 4.6  --  3.7 4.5 4.1  CL 97  98 97 97  --  97 94* 90*  CO2 26  26 25 23   --   --  26 22  GLUCOSE 116*  117* 130* 215*  --  244* 178* 175*  BUN 23  23 19  39*  --  35* 34* 65*  CREATININE 0.95  0.95 1.13* 2.21*  --  2.70* 2.25* 3.45*  CALCIUM 8.9  9.0 9.0 9.0  --   --  9.3 9.0  MG 2.5  --  2.5 2.1  --  2.1 2.0  PHOS 2.7  2.6 2.3 3.8  --   --  4.5 6.6*    Liver Function Tests:  Recent Labs Lab 02/24/14 0400  02/26/14 0410 02/26/14 1527 02/27/14 0410 03/01/14 0253 03/02/14 0259  AST 30  --  29  --   --   --  23  ALT 21  --  17  --   --   --   14  ALKPHOS 91  --  89  --   --   --  87  BILITOT 1.0  --  1.2  --   --   --  0.6  PROT 8.7*  --  8.5*  --   --   --  7.4  ALBUMIN 3.3*  < > 3.5  3.4* 3.2* 3.1* 3.0* 2.8*  < > = values in this interval not displayed. No results found for this basename: LIPASE, AMYLASE,  in the last 168 hours No results found for this basename: AMMONIA,  in the last 168 hours  CBC:  Recent Labs Lab 02/26/14 0410 02/27/14 0410 02/28/14 0248 02/28/14 0548 03/01/14 0253 03/02/14 0259  WBC 12.9* 12.0* 12.3*  --  11.6* 12.2*  HGB 8.4* 8.0* 8.7* 10.2* 8.6* 8.4*  HCT 27.8* 26.1* 27.9* 30.0* 27.7* 26.6*  MCV 97.9 99.6 95.2  --  94.9 93.0  PLT 200 215 220  --  209 233    Cardiac Enzymes: No results found for this basename: CKTOTAL, CKMB, CKMBINDEX, TROPONINI,  in the last 168 hours  BNP: BNP (last 3 results) No results found for this basename: PROBNP,  in the last 8760 hours   Other results:  EKG: AF  Imaging: Dg Chest Port 1 View  03/02/2014   CLINICAL DATA:  Interstitial edema  EXAM: PORTABLE CHEST - 1 VIEW  COMPARISON:  February 28, 2014  FINDINGS: There is interstitial edema diffusely, slightly increased compared to most recent prior study. There is some airspace consolidation in the left base behind the left heart. There is cardiomegaly with pulmonary venous hypertension. Central catheter tip is in the superior vena cava. Feeding tube tip extends below the diaphragm. No pneumothorax.  IMPRESSION: Evidence of congestive heart failure increased interstitial edema compared to most recent prior study. Airspace consolidation behind the left heart may represent alveolar edema or superimposed pneumonia. No change in cardiac silhouette.   Electronically Signed   By: Bretta BangWilliam  Woodruff M.D.   On: 03/02/2014 07:12   Dg Abd Portable 1v  03/02/2014   CLINICAL DATA:  Feeding tube placement  EXAM: PORTABLE ABDOMEN - 1 VIEW  COMPARISON:  February 27, 2014  FINDINGS: Feeding tube tip is in the proximal body of the  stomach region. Bowel gas pattern is normal. Temporary pacemaker wires are attached to the right heart.  IMPRESSION: Feeding tube tip in proximal body of stomach.   Electronically Signed   By: Bretta BangWilliam  Woodruff M.D.   On: 03/02/2014 07:34     Medications:     Scheduled Medications: . aspirin  324 mg Per Tube Daily  . bisacodyl  10 mg Oral Daily   Or  . bisacodyl  10 mg Rectal Daily  . chlorhexidine  15 mL Mouth Rinse BID  . darbepoetin (ARANESP) injection - DIALYSIS  60 mcg Intravenous Q Mon-HD  . Gerhardt's butt cream   Topical TID  . insulin aspart  0-24 Units Subcutaneous 6 times per day  . insulin glargine  25 Units Subcutaneous BID  . levothyroxine  75 mcg Per Tube QAC breakfast  . midodrine  10 mg Oral TID WC  . pantoprazole sodium  40 mg Per Tube Q1200  . potassium & sodium phosphates  1 packet Per Tube TID  . sodium chloride  10-40 mL Intracatheter Q12H  . warfarin  4 mg Oral q1800  . Warfarin - Physician Dosing Inpatient   Does not apply q1800    Infusions: . sodium chloride Stopped (02/20/14 0700)  . sodium chloride Stopped (02/20/14 1830)  . amiodarone 30 mg/hr (03/02/14 1000)  . heparin 1,800 Units/hr (03/02/14 1000)  . milrinone 0.25 mcg/kg/min (03/02/14 1000)  . norepinephrine (LEVOPHED) Adult infusion 10 mcg/min (03/02/14 1000)    PRN Medications: sodium chloride, sodium chloride, sodium chloride, sodium chloride, acetaminophen, alteplase, feeding supplement (NEPRO CARB STEADY), heparin, heparin, heparin, heparin, heparin, levalbuterol, lidocaine (PF), lidocaine (PF), lidocaine-prilocaine, lidocaine-prilocaine, ondansetron (ZOFRAN) IV, oxyCODONE, pentafluoroprop-tetrafluoroeth, pentafluoroprop-tetrafluoroeth, sodium chloride, traMADol   Assessment:   1) CAD - s/p CABG x 4 with MV replacement 2) A/C respiratory failure 3) AKI - On CVVHD 4) Cardiogenic shock 5) Afib/A Flutter 6) L Pleural effusion - s/p L CT 7) Anemia 8) Delerium, acute 9) ?  Dementia 10) HCAP 11) RV failure   Plan/Discussion:    Remains anuric but hemodynamics improved. Confusion improving. Continue to wean pressors as tolerated.   Failed DC-CV last week. Amio has been loaded. Will plan repeat DC-CV tomorrow. Continue heparin. No b-blocker or ACE due to shock and renal failure.   CAD is stable.    Daniel Bensimhon,MD 10:58 AM

## 2014-03-02 NOTE — Progress Notes (Signed)
Patient ID: [name], female   DOB: Nov 26, 1940, 73 y.o.   MRN: [id number]  SICU Evening Rounds:  Hemodynamics stable on levophed 9, milrinone 0.25  Had HD today. -3L  No other changes today

## 2014-03-02 NOTE — H&P (Signed)
Referring Physician: Dr. Marval Regal HPI: [name] is an 73 y.o. female who presented with CP/STEMI complicated by VTach and underwent CABG x4 and MVR. She also has afib/aflutter and is on hep gtt with coumadin transition. She has acute kidney injury on HD with left IJ trialysis. IR received request for tunneled HD catheter placement. The patient is confused today upon interview and consent is obtained by her daughter.   Past Medical History:  Past Medical History  Diagnosis Date  . History of MI (myocardial infarction)     PCI done @ [facility] (cannot get cath report on Care Everywhere(  . Essential hypertension   . TIA (transient ischemic attack)   . Thyroid disease   . Anxiety   . Diabetes mellitus type 2 with peripheral artery disease     On insulin  . PAD (peripheral artery disease)   . Hyperlipidemia associated with type 2 diabetes mellitus   . A-fib   . ARF (acute renal failure)     Past Surgical History:  Past Surgical History  Procedure Laterality Date  . Cardiac catheterization      PCI - unknown vessel or stent; unknown date; @ [facility].  . Abdominal hysterectomy    . Cholecystectomy    . Coronary artery bypass graft N/A 01/25/2014    Procedure: CORONARY ARTERY BYPASS GRAFTING TIMES FOUR USING LEFT INTERNAL MAMMARY ARTERY TO LAD, SAPHENOUS VEIN GRAFTS TO OM1, OM2, AND PDA;  Surgeon: Ivin Poot, MD;  Location: [place];  Service: Open Heart Surgery;  Laterality: N/A;  . Mitral valve replacement N/A 01/25/2014    Procedure: MITRAL VALVE (MV) REPLACEMENT;  Surgeon: Ivin Poot, MD;  Location: [place];  Service: Open Heart Surgery;  Laterality: N/A;    Family History:  Family History  Problem Relation Age of Onset  . Heart attack Mother   . Heart disease Mother   . Heart attack Father   . Heart disease Father     Social History:  reports that she has never smoked. She has never used smokeless tobacco. She reports that she does not drink alcohol or use  illicit drugs.  Allergies: No Known Allergies  Medications:   Medication List    ASK your doctor about these medications       ALPRAZolam 1 MG tablet  Commonly known as:  XANAX  Take 1 mg by mouth 4 (four) times daily.     amitriptyline 25 MG tablet  Commonly known as:  ELAVIL  Take 25 mg by mouth at bedtime.     amLODipine 5 MG tablet  Commonly known as:  NORVASC  Take 5 mg by mouth daily.     aspirin EC 81 MG tablet  Take 81 mg by mouth daily.     hydrochlorothiazide 12.5 MG capsule  Commonly known as:  MICROZIDE  Take 12.5 mg by mouth daily.     HYDROcodone-acetaminophen 10-325 MG per tablet  Commonly known as:  NORCO  Take 1 tablet by mouth every 6 (six) hours as needed. Pain     insulin glargine 100 UNIT/ML injection  Commonly known as:  LANTUS  Inject 60 Units into the skin at bedtime.     levothyroxine 75 MCG tablet  Commonly known as:  SYNTHROID, LEVOTHROID  Take 75 mcg by mouth daily before breakfast.     lovastatin 40 MG tablet  Commonly known as:  MEVACOR  Take 40 mg by mouth at bedtime.     metoprolol succinate 50  MG 24 hr tablet  Commonly known as:  TOPROL-XL  Take 50 mg by mouth daily. Take with or immediately following a meal.     omeprazole 20 MG capsule  Commonly known as:  PRILOSEC  Take 20 mg by mouth daily.     quinapril 40 MG tablet  Commonly known as:  ACCUPRIL  Take 40 mg by mouth daily.       Please HPI for pertinent positives, otherwise complete 10 system ROS negative.  Physical Exam: BP 136/77  Pulse 113  Temp(Src) 97.5 F (36.4 C) (Oral)  Resp 24  Ht 5' 7"  (1.702 m)  Wt 190 lb 11.2 oz (86.5 kg)  BMI 29.86 kg/m2  SpO2 99% Body mass index is 29.86 kg/(m^2).  General Appearance:  Alert, confused.  Head:  Normocephalic, without obvious abnormality, atraumatic  Neck: Supple, Left IJ trialysis   Lungs:   Diminished in bases B/L.  Chest Wall:  No tenderness or deformity  Heart:  Irregularly irregular, systolic murmur  heard at apex  Abdomen:   Soft, non-tender, non distended.  Extremities: Extremities with 1+ edema B/L   Results for orders placed during the hospital encounter of 01/25/14 (from the past 48 hour(s))  GLUCOSE, CAPILLARY     Status: Abnormal   Collection Time    02/28/14  4:08 PM      Result Value Ref Range   Glucose-Capillary 125 (*) 70 - 99 mg/dL   Comment 1 Documented in Chart     Comment 2 Notify RN    GLUCOSE, CAPILLARY     Status: Abnormal   Collection Time    02/28/14  7:50 PM      Result Value Ref Range   Glucose-Capillary 218 (*) 70 - 99 mg/dL  GLUCOSE, CAPILLARY     Status: Abnormal   Collection Time    02/28/14 11:56 PM      Result Value Ref Range   Glucose-Capillary 220 (*) 70 - 99 mg/dL  HEPARIN LEVEL (UNFRACTIONATED)     Status: Abnormal   Collection Time    03/01/14  2:53 AM      Result Value Ref Range   Heparin Unfractionated 0.19 (*) 0.30 - 0.70 IU/mL   Comment:            IF HEPARIN RESULTS ARE BELOW     EXPECTED VALUES, AND PATIENT     DOSAGE HAS BEEN CONFIRMED,     SUGGEST FOLLOW UP TESTING     OF ANTITHROMBIN III LEVELS.  CBC     Status: Abnormal   Collection Time    03/01/14  2:53 AM      Result Value Ref Range   WBC 11.6 (*) 4.0 - 10.5 K/uL   RBC 2.92 (*) 3.87 - 5.11 MIL/uL   Hemoglobin 8.6 (*) 12.0 - 15.0 g/dL   HCT 27.7 (*) 36.0 - 46.0 %   MCV 94.9  78.0 - 100.0 fL   MCH 29.5  26.0 - 34.0 pg   MCHC 31.0  30.0 - 36.0 g/dL   RDW 18.7 (*) 11.5 - 15.5 %   Platelets 209  150 - 400 K/uL  MAGNESIUM     Status: None   Collection Time    03/01/14  2:53 AM      Result Value Ref Range   Magnesium 2.1  1.5 - 2.5 mg/dL  PROTIME-INR     Status: None   Collection Time    03/01/14  2:53 AM      Result  Value Ref Range   Prothrombin Time 15.0  11.6 - 15.2 seconds   INR 1.18  0.00 - 1.49  RENAL FUNCTION PANEL     Status: Abnormal   Collection Time    03/01/14  2:53 AM      Result Value Ref Range   Sodium 135 (*) 137 - 147 mEq/L   Potassium 4.5  3.7 -  5.3 mEq/L   Comment: DELTA CHECK NOTED   Chloride 94 (*) 96 - 112 mEq/L   CO2 26  19 - 32 mEq/L   Glucose, Bld 178 (*) 70 - 99 mg/dL   BUN 34 (*) 6 - 23 mg/dL   Creatinine, Ser 2.25 (*) 0.50 - 1.10 mg/dL   Calcium 9.3  8.4 - 10.5 mg/dL   Phosphorus 4.5  2.3 - 4.6 mg/dL   Albumin 3.0 (*) 3.5 - 5.2 g/dL   GFR calc non Af Amer 20 (*) >90 mL/min   GFR calc Af Amer 24 (*) >90 mL/min   Comment: (NOTE)     The eGFR has been calculated using the CKD EPI equation.     This calculation has not been validated in all clinical situations.     eGFR's persistently <90 mL/min signify possible Chronic Kidney     Disease.   Anion gap 15  5 - 15  GLUCOSE, CAPILLARY     Status: Abnormal   Collection Time    03/01/14  3:51 AM      Result Value Ref Range   Glucose-Capillary 199 (*) 70 - 99 mg/dL  CARBOXYHEMOGLOBIN     Status: Abnormal   Collection Time    03/01/14  4:45 AM      Result Value Ref Range   Total hemoglobin 8.9 (*) 12.0 - 16.0 g/dL   O2 Saturation 59.6     Carboxyhemoglobin 2.4 (*) 0.5 - 1.5 %   Methemoglobin 1.0  0.0 - 1.5 %  GLUCOSE, CAPILLARY     Status: Abnormal   Collection Time    03/01/14  7:40 AM      Result Value Ref Range   Glucose-Capillary 202 (*) 70 - 99 mg/dL   Comment 1 Documented in Chart     Comment 2 Notify RN    HEPARIN LEVEL (UNFRACTIONATED)     Status: Abnormal   Collection Time    03/01/14 12:00 PM      Result Value Ref Range   Heparin Unfractionated 0.29 (*) 0.30 - 0.70 IU/mL   Comment:            IF HEPARIN RESULTS ARE BELOW     EXPECTED VALUES, AND PATIENT     DOSAGE HAS BEEN CONFIRMED,     SUGGEST FOLLOW UP TESTING     OF ANTITHROMBIN III LEVELS.  GLUCOSE, CAPILLARY     Status: Abnormal   Collection Time    03/01/14 12:46 PM      Result Value Ref Range   Glucose-Capillary 256 (*) 70 - 99 mg/dL   Comment 1 Documented in Chart     Comment 2 Notify RN    GLUCOSE, CAPILLARY     Status: Abnormal   Collection Time    03/01/14  3:53 PM      Result  Value Ref Range   Glucose-Capillary 171 (*) 70 - 99 mg/dL   Comment 1 Documented in Chart     Comment 2 Notify RN    GLUCOSE, CAPILLARY     Status: Abnormal   Collection Time  03/01/14  8:17 PM      Result Value Ref Range   Glucose-Capillary 230 (*) 70 - 99 mg/dL   Comment 1 Notify RN    GLUCOSE, CAPILLARY     Status: Abnormal   Collection Time    03/02/14  1:25 AM      Result Value Ref Range   Glucose-Capillary 222 (*) 70 - 99 mg/dL  HEPARIN LEVEL (UNFRACTIONATED)     Status: None   Collection Time    03/02/14  2:59 AM      Result Value Ref Range   Heparin Unfractionated 0.54  0.30 - 0.70 IU/mL   Comment:            IF HEPARIN RESULTS ARE BELOW     EXPECTED VALUES, AND PATIENT     DOSAGE HAS BEEN CONFIRMED,     SUGGEST FOLLOW UP TESTING     OF ANTITHROMBIN III LEVELS.  CBC     Status: Abnormal   Collection Time    03/02/14  2:59 AM      Result Value Ref Range   WBC 12.2 (*) 4.0 - 10.5 K/uL   RBC 2.86 (*) 3.87 - 5.11 MIL/uL   Hemoglobin 8.4 (*) 12.0 - 15.0 g/dL   HCT 26.6 (*) 36.0 - 46.0 %   MCV 93.0  78.0 - 100.0 fL   MCH 29.4  26.0 - 34.0 pg   MCHC 31.6  30.0 - 36.0 g/dL   RDW 18.0 (*) 11.5 - 15.5 %   Platelets 233  150 - 400 K/uL  COMPREHENSIVE METABOLIC PANEL     Status: Abnormal   Collection Time    03/02/14  2:59 AM      Result Value Ref Range   Sodium 132 (*) 137 - 147 mEq/L   Potassium 4.1  3.7 - 5.3 mEq/L   Chloride 90 (*) 96 - 112 mEq/L   CO2 22  19 - 32 mEq/L   Glucose, Bld 175 (*) 70 - 99 mg/dL   BUN 65 (*) 6 - 23 mg/dL   Comment: DELTA CHECK NOTED   Creatinine, Ser 3.45 (*) 0.50 - 1.10 mg/dL   Comment: DELTA CHECK NOTED   Calcium 9.0  8.4 - 10.5 mg/dL   Total Protein 7.4  6.0 - 8.3 g/dL   Albumin 2.8 (*) 3.5 - 5.2 g/dL   AST 23  0 - 37 U/L   ALT 14  0 - 35 U/L   Alkaline Phosphatase 87  39 - 117 U/L   Total Bilirubin 0.6  0.3 - 1.2 mg/dL   GFR calc non Af Amer 12 (*) >90 mL/min   GFR calc Af Amer 14 (*) >90 mL/min   Comment: (NOTE)     The  eGFR has been calculated using the CKD EPI equation.     This calculation has not been validated in all clinical situations.     eGFR's persistently <90 mL/min signify possible Chronic Kidney     Disease.   Anion gap 20 (*) 5 - 15  MAGNESIUM     Status: None   Collection Time    03/02/14  2:59 AM      Result Value Ref Range   Magnesium 2.0  1.5 - 2.5 mg/dL  PHOSPHORUS     Status: Abnormal   Collection Time    03/02/14  2:59 AM      Result Value Ref Range   Phosphorus 6.6 (*) 2.3 - 4.6 mg/dL  PROTIME-INR  Status: Abnormal   Collection Time    03/02/14  2:59 AM      Result Value Ref Range   Prothrombin Time 15.4 (*) 11.6 - 15.2 seconds   INR 1.22  0.00 - 1.49  CARBOXYHEMOGLOBIN     Status: Abnormal   Collection Time    03/02/14  5:00 AM      Result Value Ref Range   Total hemoglobin 8.0 (*) 12.0 - 16.0 g/dL   O2 Saturation 69.1     Carboxyhemoglobin 2.0 (*) 0.5 - 1.5 %   Methemoglobin 0.7  0.0 - 1.5 %  GLUCOSE, CAPILLARY     Status: Abnormal   Collection Time    03/02/14  7:42 AM      Result Value Ref Range   Glucose-Capillary 214 (*) 70 - 99 mg/dL  GLUCOSE, CAPILLARY     Status: Abnormal   Collection Time    03/02/14 11:49 AM      Result Value Ref Range   Glucose-Capillary 127 (*) 70 - 99 mg/dL   Dg Chest Port 1 View  03/02/2014   CLINICAL DATA:  Interstitial edema  EXAM: PORTABLE CHEST - 1 VIEW  COMPARISON:  February 28, 2014  FINDINGS: There is interstitial edema diffusely, slightly increased compared to most recent prior study. There is some airspace consolidation in the left base behind the left heart. There is cardiomegaly with pulmonary venous hypertension. Central catheter tip is in the superior vena cava. Feeding tube tip extends below the diaphragm. No pneumothorax.  IMPRESSION: Evidence of congestive heart failure increased interstitial edema compared to most recent prior study. Airspace consolidation behind the left heart may represent alveolar edema or  superimposed pneumonia. No change in cardiac silhouette.   Electronically Signed   By: Lowella Grip M.D.   On: 03/02/2014 07:12   Dg Abd Portable 1v  03/02/2014   CLINICAL DATA:  Feeding tube placement  EXAM: PORTABLE ABDOMEN - 1 VIEW  COMPARISON:  February 27, 2014  FINDINGS: Feeding tube tip is in the proximal body of the stomach region. Bowel gas pattern is normal. Temporary pacemaker wires are attached to the right heart.  IMPRESSION: Feeding tube tip in proximal body of stomach.   Electronically Signed   By: Lowella Grip M.D.   On: 03/02/2014 07:34    Assessment/Plan Acute kidney injury on HD with left IJ trialysis Nephrology requested a tunneled HD catheter  Patient to be NPO after midnight. Chest pain/STEMI Cardiogenic shock/RV failure S/p CABG x 4 and MVR 01/26/14, afib/aflutter on heparin gtt and coumadin, will stop heparin gtt 2-3 hrs prior to procedure on 8/18. Risks and Benefits discussed with the patient's daughter over the phone today. Allquestions were answered, patient's daughter is agreeable to proceed. Consent signed and in chart.   Tsosie Billing D PA-C 03/02/2014, 3:34 PM

## 2014-03-03 ENCOUNTER — Inpatient Hospital Stay (HOSPITAL_COMMUNITY): Payer: Medicare Other | Admitting: Anesthesiology

## 2014-03-03 ENCOUNTER — Encounter (HOSPITAL_COMMUNITY)
Admission: EM | Disposition: A | Discharge status: Nursing Home (Includes ICF) | Payer: Self-pay | Source: Home / Self Care | Attending: Cardiothoracic Surgery

## 2014-03-03 ENCOUNTER — Inpatient Hospital Stay (HOSPITAL_COMMUNITY): Payer: Medicare Other

## 2014-03-03 ENCOUNTER — Encounter (HOSPITAL_COMMUNITY): Payer: Medicare Other | Admitting: Anesthesiology

## 2014-03-03 DIAGNOSIS — I4892 Unspecified atrial flutter: Secondary | ICD-10-CM

## 2014-03-03 DIAGNOSIS — N170 Acute kidney failure with tubular necrosis: Secondary | ICD-10-CM

## 2014-03-03 LAB — GLUCOSE, CAPILLARY
Glucose-Capillary: 117 mg/dL — ABNORMAL HIGH (ref 70–99)
Glucose-Capillary: 130 mg/dL — ABNORMAL HIGH (ref 70–99)
Glucose-Capillary: 151 mg/dL — ABNORMAL HIGH (ref 70–99)
Glucose-Capillary: 184 mg/dL — ABNORMAL HIGH (ref 70–99)
Glucose-Capillary: 189 mg/dL — ABNORMAL HIGH (ref 70–99)
Glucose-Capillary: 196 mg/dL — ABNORMAL HIGH (ref 70–99)

## 2014-03-03 LAB — RENAL FUNCTION PANEL
Albumin: 2.9 g/dL — ABNORMAL LOW (ref 3.5–5.2)
Anion gap: 16 — ABNORMAL HIGH (ref 5–15)
BUN: 30 mg/dL — ABNORMAL HIGH (ref 6–23)
CO2: 26 mEq/L (ref 19–32)
Calcium: 9 mg/dL (ref 8.4–10.5)
Chloride: 95 mEq/L — ABNORMAL LOW (ref 96–112)
Creatinine, Ser: 2.52 mg/dL — ABNORMAL HIGH (ref 0.50–1.10)
GFR calc Af Amer: 21 mL/min — ABNORMAL LOW (ref >90)
GFR calc non Af Amer: 18 mL/min — ABNORMAL LOW (ref >90)
Glucose, Bld: 183 mg/dL — ABNORMAL HIGH (ref 70–99)
Phosphorus: 4.5 mg/dL (ref 2.3–4.6)
Potassium: 4.1 mEq/L (ref 3.7–5.3)
Sodium: 137 mEq/L (ref 137–147)

## 2014-03-03 LAB — CARBOXYHEMOGLOBIN
Carboxyhemoglobin: 2.3 % — ABNORMAL HIGH (ref 0.5–1.5)
Methemoglobin: 0.6 % (ref 0.0–1.5)
O2 Saturation: 61.7 %
Total hemoglobin: 9 g/dL — ABNORMAL LOW (ref 12.0–16.0)

## 2014-03-03 LAB — CBC
HCT: 27.4 % — ABNORMAL LOW (ref 36.0–46.0)
Hemoglobin: 8.6 g/dL — ABNORMAL LOW (ref 12.0–15.0)
MCH: 29.6 pg (ref 26.0–34.0)
MCHC: 31.4 g/dL (ref 30.0–36.0)
MCV: 94.2 fL (ref 78.0–100.0)
Platelets: 191 10*3/uL (ref 150–400)
RBC: 2.91 MIL/uL — ABNORMAL LOW (ref 3.87–5.11)
RDW: 18 % — ABNORMAL HIGH (ref 11.5–15.5)
WBC: 10.7 10*3/uL — ABNORMAL HIGH (ref 4.0–10.5)

## 2014-03-03 LAB — PROTIME-INR
INR: 1.31 (ref 0.00–1.49)
Prothrombin Time: 16.3 seconds — ABNORMAL HIGH (ref 11.6–15.2)

## 2014-03-03 LAB — MAGNESIUM: Magnesium: 2 mg/dL (ref 1.5–2.5)

## 2014-03-03 LAB — HEPARIN LEVEL (UNFRACTIONATED): Heparin Unfractionated: 0.69 IU/mL (ref 0.30–0.70)

## 2014-03-03 SURGERY — CARDIOVERSION
Anesthesia: General

## 2014-03-03 MED ORDER — SODIUM CHLORIDE 0.9 % IV SOLN
INTRAVENOUS | Status: DC | PRN
  Administered 2014-03-03: 11:00:00 via INTRAVENOUS

## 2014-03-03 MED ORDER — LIDOCAINE HCL (PF) 1 % IJ SOLN
INTRAMUSCULAR | Status: AC
  Filled 2014-03-03: qty 30

## 2014-03-03 MED ORDER — PRO-STAT SUGAR FREE PO LIQD
30.0000 mL | Freq: Four times a day (QID) | ORAL | Status: DC
  Administered 2014-03-03 – 2014-03-06 (×13): 30 mL
  Filled 2014-03-03 (×14): qty 30

## 2014-03-03 MED ORDER — HEPARIN SODIUM (PORCINE) 1000 UNIT/ML IJ SOLN
INTRAMUSCULAR | Status: AC
  Filled 2014-03-03: qty 1

## 2014-03-03 MED ORDER — LIDOCAINE HCL (CARDIAC) 20 MG/ML IV SOLN
INTRAVENOUS | Status: DC | PRN
  Administered 2014-03-03: 40 mg via INTRAVENOUS

## 2014-03-03 MED ORDER — INSULIN GLARGINE 100 UNIT/ML ~~LOC~~ SOLN
30.0000 [IU] | Freq: Two times a day (BID) | SUBCUTANEOUS | Status: DC
Start: 2014-03-03 — End: 2014-03-06
  Administered 2014-03-03 – 2014-03-05 (×5): 30 [IU] via SUBCUTANEOUS
  Filled 2014-03-03 (×7): qty 0.3

## 2014-03-03 MED ORDER — CEFAZOLIN SODIUM-DEXTROSE 2-3 GM-% IV SOLR
INTRAVENOUS | Status: AC
  Administered 2014-03-03: 2000 mg
  Filled 2014-03-03: qty 50

## 2014-03-03 MED ORDER — PROPOFOL 10 MG/ML IV BOLUS
INTRAVENOUS | Status: DC | PRN
  Administered 2014-03-03: 50 mg via INTRAVENOUS

## 2014-03-03 MED ORDER — HEPARIN (PORCINE) IN NACL 100-0.45 UNIT/ML-% IJ SOLN
1950.0000 [IU]/h | INTRAMUSCULAR | Status: DC
  Administered 2014-03-03 – 2014-03-04 (×2): 1750 [IU]/h via INTRAVENOUS
  Administered 2014-03-05: 1950 [IU]/h via INTRAVENOUS
  Filled 2014-03-03 (×4): qty 250

## 2014-03-03 MED ORDER — INSULIN GLARGINE 100 UNIT/ML ~~LOC~~ SOLN
20.0000 [IU] | Freq: Two times a day (BID) | SUBCUTANEOUS | Status: DC
  Administered 2014-03-03: 20 [IU] via SUBCUTANEOUS
  Filled 2014-03-03 (×2): qty 0.2

## 2014-03-03 NOTE — Transfer of Care (Signed)
Immediate Anesthesia Transfer of Care Note  Patient: [name]  Procedure(s) Performed: Procedure(s) with comments: CARDIOVERSION (N/A) - bedside   Patient Location: SICU  Anesthesia Type:General  Level of Consciousness: awake, alert  and oriented  Airway & Oxygen Therapy: Patient Spontanous Breathing  Post-op Assessment: Report given to PACU RN  Post vital signs: Reviewed and stable  Complications: No apparent anesthesia complications

## 2014-03-03 NOTE — Anesthesia Postprocedure Evaluation (Signed)
  Anesthesia Post-op Note  Patient: [name]  Procedure(s) Performed: Procedure(s) with comments: CARDIOVERSION (N/A) - bedside   Patient Location: SICU  Anesthesia Type:General  Level of Consciousness: awake and alert   Airway and Oxygen Therapy: Patient Spontanous Breathing and Patient connected to nasal cannula oxygen  Post-op Pain: none  Post-op Assessment: Post-op Vital signs reviewed  Post-op Vital Signs: Reviewed and stable  Last Vitals:  Filed Vitals:   03/03/14 1030  BP: 107/63  Pulse: 92  Temp:   Resp: 28    Complications: No apparent anesthesia complications

## 2014-03-03 NOTE — Procedures (Signed)
RIJV HD catheter No comp SVC RA 23 cm

## 2014-03-03 NOTE — Anesthesia Preprocedure Evaluation (Signed)
Anesthesia Evaluation  Patient identified by MRN, date of birth, ID band Patient awake and Patient confused    Reviewed: Allergy & Precautions  Airway Mallampati: I TM Distance: >3 FB Neck ROM: Full    Dental  (+) Teeth Intact, Dental Advisory Given   Pulmonary          Cardiovascular hypertension, + CAD, + Past MI and + Peripheral Vascular Disease + dysrhythmias Atrial Fibrillation Rhythm:irregular Rate:Abnormal  S/p CABG and MVR 01/25/14, EF 02/25/14 40-45%   Neuro/Psych TIA   GI/Hepatic   Endo/Other  diabetes, Type 2CBG 130  Renal/GU      Musculoskeletal   Abdominal   Peds  Hematology   Anesthesia Other Findings   Reproductive/Obstetrics                           Anesthesia Physical Anesthesia Plan  ASA: III  Anesthesia Plan: General   Post-op Pain Management:    Induction:   Airway Management Planned:   Additional Equipment:   Intra-op Plan:   Post-operative Plan:   Informed Consent: I have reviewed the patients History and Physical, chart, labs and discussed the procedure including the risks, benefits and alternatives for the proposed anesthesia with the patient or authorized representative who has indicated his/her understanding and acceptance.     Plan Discussed with: CRNA  Anesthesia Plan Comments:         Anesthesia Quick Evaluation

## 2014-03-03 NOTE — Progress Notes (Signed)
ANTICOAGULATION CONSULT NOTE - Follow Up Consult  Pharmacy Consult for Heparin  Indication: atrial fibrillation  No Known Allergies  Patient Measurements: Height: 5\' 7"  (170.2 cm) Weight: 190 lb 14.7 oz (86.6 kg) IBW/kg (Calculated) : 61.6  Vital Signs: Temp: 98.5 F (36.9 C) (08/18 0402) Temp src: Oral (08/18 0402) BP: 107/59 mmHg (08/18 0700) Pulse Rate: 101 (08/18 0700)  Labs:  Recent Labs  03/01/14 0253 03/01/14 1200 03/02/14 0259 03/03/14 0400  HGB 8.6*  --  8.4* 8.6*  HCT 27.7*  --  26.6* 27.4*  PLT 209  --  233 191  LABPROT 15.0  --  15.4* 16.3*  INR 1.18  --  1.22 1.31  HEPARINUNFRC 0.19* 0.29* 0.54 0.69  CREATININE 2.25*  --  3.45* 2.52*    Estimated Creatinine Clearance: 22.1 ml/min (by C-G formula based on Cr of 2.52).   Assessment: 73 y/o F transitioning to [facility] for acute renal failure post CABG.  She is on a heparin drip for prosthetic MVR and afib at 1800 units/hr. Heparin level is now above goal range at 0.69. H/H is low but stable, plt wnl and stable.  No bleeding noted. HIT negative on 7/15. Other labs as above.   Goal of Therapy:  Heparin level: 0.3-0.5 per Dr. Donata Clay Monitor platelets by anticoagulation protocol: Yes   Plan:  - Decrease heparin to 1750 units/hr - Daily heparin level and CBC - Monitor platelets closely and s/s bleeding -Continue Coumadin dosing per MD  Margie Billet, PharmD Clinical Pharmacist - Resident Pager: [phone] Pharmacy: [phone] 03/03/2014 7:12 AM

## 2014-03-03 NOTE — Procedures (Signed)
Objective Swallowing Evaluation: Fiberoptic Endoscopic Evaluation of Swallowing  Patient Details  Name: [name] MRN: [id number] Date of Birth: Mar 17, 1941  Today's Date: 03/03/2014 Time: [id number] SLP Time Calculation (min): 23 min  Past Medical History:  Past Medical History  Diagnosis Date  . History of MI (myocardial infarction)     PCI done @ [facility] (cannot get cath report on Care Everywhere(  . Essential hypertension   . TIA (transient ischemic attack)   . Thyroid disease   . Anxiety   . Diabetes mellitus type 2 with peripheral artery disease     On insulin  . PAD (peripheral artery disease)   . Hyperlipidemia associated with type 2 diabetes mellitus   . A-fib   . ARF (acute renal failure)    Past Surgical History:  Past Surgical History  Procedure Laterality Date  . Cardiac catheterization      PCI - unknown vessel or stent; unknown date; [facility].  . Abdominal hysterectomy    . Cholecystectomy    . Coronary artery bypass graft N/A 01/25/2014    Procedure: CORONARY ARTERY BYPASS GRAFTING TIMES FOUR USING LEFT INTERNAL MAMMARY ARTERY TO LAD, SAPHENOUS VEIN GRAFTS TO OM1, OM2, AND PDA;  Surgeon: Kerin PernaPeter Van Trigt, MD;  [facility];  Service: Open Heart Surgery;  Laterality: N/A;  . Mitral valve replacement N/A 01/25/2014    Procedure: MITRAL VALVE (MV) REPLACEMENT;  Surgeon: Kerin PernaPeter Van Trigt, MD;  [facility];  Service: Open Heart Surgery;  Laterality: N/A;   HPI:  Patient with past medical history of DM, MI, HTN, admitted with MI and underwent emergent CABG x 4 and MVR on 7/13. Post op course complicated by VDRF and bilateral pleural effusions and requiring CVVHD. Extubated 7/31.      Assessment / Plan / Recommendation Clinical Impression  Dysphagia Diagnosis: Moderate pharyngeal phase dysphagia;Moderate oral phase dysphagia Clinical impression: Poor pt participation in objective testing; pt need max encouragement and cueing from tech and therapist to  accept all boluses, even when presented with pleasurable PO. Pt was observed to have a moderate oral phase dysphagia caused primarily by cognitive deficits with anterior spillage of liquids, oral holding and reduced oral propulsion of bolus. Pt did accept enough trials to determine that oropharyngeal phase is moderately impaired with weakness and sluggish mobility of hylaryngeal structures with a delay in swallow iniatiion. Residue was moderate and pt needed encouragement to initiate second swallows to transit. No penetration or aspiration was obseved given limited trials, but continue to anticipate high risk of aspiration with thin liquids dur to delayed swallow. Will proceed with PO trials at bedside of puree and thickened liquids. Pt will need ongoing alternate method of nutrition and medication until she is able to more reliably consume puree and thickened liquids.     Treatment Recommendation  Therapy as outlined in treatment plan below    Diet Recommendation NPO;Alternative means - temporary   Medication Administration: Via alternative means    Other  Recommendations Oral Care Recommendations: Oral care Q4 per protocol Other Recommendations: Order thickener from pharmacy   Follow Up Recommendations  LTACH;24 hour supervision/assistance    Frequency and Duration min 3x week  2 weeks   Pertinent Vitals/Pain NA    SLP Swallow Goals     General HPI: Patient with past medical history of DM, MI, HTN, admitted with MI and underwent emergent CABG x 4 and MVR on 7/13. Post op course complicated by VDRF and bilateral pleural effusions and  requiring CVVHD. Extubated 7/31.  Type of Study: Fiberoptic Endoscopic Evaluation of Swallowing Reason for Referral: Objectively evaluate swallowing function Previous Swallow Assessment: none on record Diet Prior to this Study: NPO;Panda Temperature Spikes Noted: No Respiratory Status: Room air History of Recent Intubation: Yes Length of Intubations  (days): 18 days Date extubated: 02/13/14 Behavior/Cognition: Confused;Requires cueing;Uncooperative;Alert Oral Cavity - Dentition: Missing dentition Oral Motor / Sensory Function: Impaired - see Bedside swallow eval (generalized weakness) Self-Feeding Abilities: Total assist Patient Positioning: Upright in bed Baseline Vocal Quality: Low vocal intensity Volitional Cough: Cognitively unable to elicit Volitional Swallow: Able to elicit Anatomy:  (white plaque visible on the posterior tracheal surface) Pharyngeal Secretions: Normal    Reason for Referral Objectively evaluate swallowing function   Oral Phase Oral Preparation/Oral Phase Oral Phase: Impaired Oral - Nectar Oral - Nectar Teaspoon: Holding of bolus;Reduced posterior propulsion;Delayed oral transit;Lingual/palatal residue;Right anterior bolus loss;Left anterior bolus loss (spitting and refusing bolus) Oral - Thin Oral - Thin Teaspoon: Holding of bolus;Reduced posterior propulsion;Delayed oral transit;Lingual/palatal residue;Right anterior bolus loss;Left anterior bolus loss Oral - Solids Oral - Puree: Holding of bolus;Reduced posterior propulsion;Delayed oral transit;Lingual/palatal residue;Right anterior bolus loss;Left anterior bolus loss   Pharyngeal Phase Pharyngeal Phase Pharyngeal Phase: Impaired Pharyngeal - Nectar Pharyngeal - Nectar Teaspoon: Premature spillage to pyriform sinuses;Delayed swallow initiation;Reduced tongue base retraction;Reduced laryngeal elevation;Reduced anterior laryngeal mobility;Reduced epiglottic inversion;Reduced pharyngeal peristalsis;Pharyngeal residue - valleculae;Pharyngeal residue - pyriform sinuses;Pharyngeal residue - posterior pharnyx;Lateral channel residue Pharyngeal - Thin Pharyngeal - Ice Chips: Premature spillage to pyriform sinuses;Delayed swallow initiation;Reduced tongue base retraction;Reduced laryngeal elevation;Reduced anterior laryngeal mobility;Reduced epiglottic  inversion;Reduced pharyngeal peristalsis;Pharyngeal residue - valleculae;Pharyngeal residue - pyriform sinuses;Pharyngeal residue - posterior pharnyx;Lateral channel residue Pharyngeal - Thin Teaspoon: Premature spillage to pyriform sinuses;Delayed swallow initiation;Reduced tongue base retraction;Reduced laryngeal elevation;Reduced anterior laryngeal mobility;Reduced epiglottic inversion;Reduced pharyngeal peristalsis;Pharyngeal residue - valleculae;Pharyngeal residue - pyriform sinuses;Pharyngeal residue - posterior pharnyx;Lateral channel residue Pharyngeal - Solids Pharyngeal - Puree: Premature spillage to pyriform sinuses;Delayed swallow initiation;Reduced tongue base retraction;Reduced laryngeal elevation;Reduced anterior laryngeal mobility;Reduced epiglottic inversion;Reduced pharyngeal peristalsis;Pharyngeal residue - valleculae;Pharyngeal residue - pyriform sinuses;Pharyngeal residue - posterior pharnyx;Lateral channel residue  Cervical Esophageal Phase    GO    Cervical Esophageal Phase Cervical Esophageal Phase: [facility]        [name], MA CCC-SLP [phone]  [name], [name] 03/03/2014, 10:22 AM

## 2014-03-03 NOTE — Progress Notes (Signed)
Patient ID: [name], female   DOB: 07/18/1940, 73 y.o.   MRN: [id number] Advanced Heart Failure Rounding Note   Subjective:    [name] is a 73 yo female with a history of CAD s/p PCI at [facility], HTN, TIA, DM2 and PAD. She presented to [facility] 01/25/14 with CP,Vtach and ST changes in inferior leads and was taken to the cath lab. She had severe multivessel CAD involving 100% distal RCA with tandem 90% & 80% lesions, proximal D1 100%, proximal LAD & AVGroove Circ 90% with 80% OM2 and was evaluated by Dr. Maren Beach for CABG and IABP was placed.  Underwent emergent CABGx4 and MV replacement (LIMA-LAD, SVG to OM1, SVG to OM2 and SVG to PDA) on 01/26/14. She was transferred to the ICU on multiple pressors and IABP. UOP was sluggish and remained volume overloaded and lasix gtt was started. IABP removed 7/17. TF started for nutrition. Had difficulty with thrombocytopenia which improved and HIT studies negative and Coumadin was restarted. Underwent thoracentesis on R on 02/06/14. Extubated 7/25 but re-intubated 7/27 electively for therapeutic bronchoscopy showing no significant mucus, airways clear w/ exception of what appeared to be suction trauma at the inlet of the RUL. Underwent L CT by IR on 7/27. Her Creatinine was stable for awhile and then started trending up even on milrinone. Underwent HD cath placement 7/28 and started on CVVHD on 7/29. Extubated again 02/13/14. Has had issues with Afib/Aflutter.   ECHO 02/17/14. Reviewed personally EF 35-40% Inferior wall out. RV moderately HK with TAPSE 1.18cm  Remains on CVVHD. Underwent DC-CV last week but reverted to AF soon after. Amio load ongoing.  Much less confused. Tolerating HD. Co-ox remains improved at 62%.  Weaning pressors slowly. Remains on levophed 7.5 and milrinone 0.25. CVP 8. Remains anuric.   Still in atypical atrial flutter.   Objective:   Weight Range:  Vital Signs:   Temp:  [97.5 F (36.4 C)-98.6 F (37 C)] 98.5 F (36.9 C) (08/18 0402) Pulse  Rate:  [97-119] 101 (08/18 0700) Resp:  [16-30] 27 (08/18 0700) BP: (79-162)/(33-128) 107/59 mmHg (08/18 0700) SpO2:  [92 %-99 %] 95 % (08/18 0700) Weight:  [190 lb 11.2 oz (86.5 kg)-192 lb 14.4 oz (87.5 kg)] 190 lb 14.7 oz (86.6 kg) (08/18 0500) Last BM Date: 03/02/14  Weight change: Filed Weights   03/02/14 1045 03/02/14 1513 03/03/14 0500  Weight: 192 lb 14.4 oz (87.5 kg) 190 lb 11.2 oz (86.5 kg) 190 lb 14.7 oz (86.6 kg)    Intake/Output:   Intake/Output Summary (Last 24 hours) at 03/03/14 0812 Last data filed at 03/03/14 0715  Gross per 24 hour  Intake 1817.93 ml  Output   3100 ml  Net -1282.07 ml     Physical Exam: CVP 8  General:  Chronically ill appearing. No resp difficulty, confused HEENT: normal; Panda intact; L IJ HD cath Neck: supple. JVP 8-9 Carotids 2+ bilat; no bruits. No lymphadenopathy or thryomegaly appreciated. Cor: PMI nondisplaced. Regular rate & irregular rhythm. No rubs, gallops or murmurs. Lungs: Diminished in the bases Abdomen: soft, nontender, nondistended. No hepatosplenomegaly. No bruits or masses. Good bowel sounds. Extremities: no cyanosis, clubbing, rash, no edema; R 3L PICC Neuro: alert. Working with [employer] all 4 extremities w/o difficulty. Affect flat   Telemetry: atypical atrial flutter in 100s  Labs: Basic Metabolic Panel:  Recent Labs Lab 02/26/14 1527 02/27/14 0410 02/28/14 0248 02/28/14 0548 03/01/14 0253 03/02/14 0259 03/03/14 0400  NA 137 135*  --  136* 135*  132* 137  K 4.4 4.6  --  3.7 4.5 4.1 4.1  CL 97 97  --  97 94* 90* 95*  CO2 25 23  --   --  26 22 26   GLUCOSE 130* 215*  --  244* 178* 175* 183*  BUN 19 39*  --  35* 34* 65* 30*  CREATININE 1.13* 2.21*  --  2.70* 2.25* 3.45* 2.52*  CALCIUM 9.0 9.0  --   --  9.3 9.0 9.0  MG  --  2.5 2.1  --  2.1 2.0 2.0  PHOS 2.3 3.8  --   --  4.5 6.6* 4.5    Liver Function Tests:  Recent Labs Lab 02/26/14 0410 02/26/14 1527 02/27/14 0410 03/01/14 0253 03/02/14 0259  03/03/14 0400  AST 29  --   --   --  23  --   ALT 17  --   --   --  14  --   ALKPHOS 89  --   --   --  87  --   BILITOT 1.2  --   --   --  0.6  --   PROT 8.5*  --   --   --  7.4  --   ALBUMIN 3.5  3.4* 3.2* 3.1* 3.0* 2.8* 2.9*   No results found for this basename: LIPASE, AMYLASE,  in the last 168 hours No results found for this basename: AMMONIA,  in the last 168 hours  CBC:  Recent Labs Lab 02/27/14 0410 02/28/14 0248 02/28/14 0548 03/01/14 0253 03/02/14 0259 03/03/14 0400  WBC 12.0* 12.3*  --  11.6* 12.2* 10.7*  HGB 8.0* 8.7* 10.2* 8.6* 8.4* 8.6*  HCT 26.1* 27.9* 30.0* 27.7* 26.6* 27.4*  MCV 99.6 95.2  --  94.9 93.0 94.2  PLT 215 220  --  209 233 191    Cardiac Enzymes: No results found for this basename: CKTOTAL, CKMB, CKMBINDEX, TROPONINI,  in the last 168 hours  BNP: BNP (last 3 results) No results found for this basename: PROBNP,  in the last 8760 hours   Other results:  EKG: AF  Imaging: Dg Abd 1 View  03/02/2014   CLINICAL DATA:  Advanced feeding tube.  EXAM: ABDOMEN - 1 VIEW  COMPARISON:  Abdomen series 8/17 2015.  FINDINGS: Double right . tube is noted with its tip projected over the distal transverse portion of the duodenum. Contrast is noted in the distal duodenum and proximal jejunum. 0 min is 23 seconds of fluoroscopy is by report.  IMPRESSION: Double tube tip noted in the distal aspect of the transverse portion of the duodenum.   Electronically Signed   By: Maisie Fushomas  Register   On: 03/02/2014 15:41   Dg Chest Port 1 View  03/03/2014   CLINICAL DATA:  CABG, followup  EXAM: PORTABLE CHEST - 1 VIEW  COMPARISON:  Portable chest x-ray of 03/02/2014  FINDINGS: The edema pattern has improved with only mild congestion remaining. Cardiomegaly is stable. Central venous line is unchanged.  IMPRESSION: Improved aeration with improvement in edema pattern.   Electronically Signed   By: Dwyane DeePaul  Barry M.D.   On: 03/03/2014 08:06   Dg Chest Port 1 View  03/02/2014    CLINICAL DATA:  Interstitial edema  EXAM: PORTABLE CHEST - 1 VIEW  COMPARISON:  February 28, 2014  FINDINGS: There is interstitial edema diffusely, slightly increased compared to most recent prior study. There is some airspace consolidation in the left base behind the left heart. There is cardiomegaly  with pulmonary venous hypertension. Central catheter tip is in the superior vena cava. Feeding tube tip extends below the diaphragm. No pneumothorax.  IMPRESSION: Evidence of congestive heart failure increased interstitial edema compared to most recent prior study. Airspace consolidation behind the left heart may represent alveolar edema or superimposed pneumonia. No change in cardiac silhouette.   Electronically Signed   By: Bretta Bang M.D.   On: 03/02/2014 07:12   Dg Abd Portable 1v  03/02/2014   CLINICAL DATA:  Feeding tube placement  EXAM: PORTABLE ABDOMEN - 1 VIEW  COMPARISON:  February 27, 2014  FINDINGS: Feeding tube tip is in the proximal body of the stomach region. Bowel gas pattern is normal. Temporary pacemaker wires are attached to the right heart.  IMPRESSION: Feeding tube tip in proximal body of stomach.   Electronically Signed   By: Bretta Bang M.D.   On: 03/02/2014 07:34   Dg Vangie Bicker G Tube Plc W/fl-no Rad  03/02/2014   CLINICAL DATA: needs post pyloric FT   NASO G TUBE PLACEMENT WITH FLUORO  Fluoroscopy was utilized by the requesting physician.  No radiographic  interpretation.      Medications:     Scheduled Medications: . aspirin  324 mg Per Tube Daily  . bisacodyl  10 mg Oral Daily   Or  . bisacodyl  10 mg Rectal Daily  . chlorhexidine  15 mL Mouth Rinse BID  . darbepoetin (ARANESP) injection - DIALYSIS  60 mcg Intravenous Q Mon-HD  . Gerhardt's butt cream   Topical TID  . insulin aspart  0-24 Units Subcutaneous 6 times per day  . insulin glargine  25 Units Subcutaneous BID  . levothyroxine  75 mcg Per Tube QAC breakfast  . midodrine  10 mg Oral TID WC  . pantoprazole  sodium  40 mg Per Tube Q1200  . potassium & sodium phosphates  1 packet Per Tube TID  . sodium chloride  10-40 mL Intracatheter Q12H  . warfarin  4 mg Oral q1800  . Warfarin - Physician Dosing Inpatient   Does not apply q1800    Infusions: . sodium chloride Stopped (02/20/14 0700)  . sodium chloride Stopped (02/20/14 1830)  . amiodarone 30 mg/hr (03/03/14 0715)  . feeding supplement (NEPRO CARB STEADY) Stopped (03/03/14 0715)  . heparin 1,750 Units/hr (03/03/14 0715)  . milrinone 0.25 mcg/kg/min (03/03/14 0715)  . norepinephrine (LEVOPHED) Adult infusion 7.5 mcg/min (03/03/14 0715)    PRN Medications: sodium chloride, sodium chloride, sodium chloride, acetaminophen, feeding supplement (NEPRO CARB STEADY), heparin, heparin, heparin, levalbuterol, lidocaine (PF), lidocaine-prilocaine, ondansetron (ZOFRAN) IV, oxyCODONE, pentafluoroprop-tetrafluoroeth, sodium chloride, traMADol   Assessment:   1) CAD - s/p CABG x 4 with MV replacement 2) A/C respiratory failure 3) AKI - On CVVHD 4) Cardiogenic shock 5) Afib/atypical A Flutter 6) L Pleural effusion - s/p L CT 7) Anemia 8) Delerium, acute 9) ? Dementia 10) HCAP 11) RV failure   Plan/Discussion:    Remains anuric but hemodynamics improved. Confusion still present. Continue to wean pressors as tolerated. Plan for eventual L-tac.   Failed DC-CV last week. She appears to be in atypical atrial flutter.  Amio has been loaded. Will plan repeat DC-CV today. Continue heparin/coumadin. No b-blocker or ACE due to shock and renal failure.   CAD is stable.    Annalyce Lanpher,MD 03/03/2014

## 2014-03-03 NOTE — Progress Notes (Signed)
Physical Therapy Treatment Patient Details Name: [name] MRN: [id number] DOB: 28-Nov-1940 Today's Date: 03/03/2014    History of Present Illness Pt adm with MI and underwent emergent CABG x 4 and MVR on 7/13. Post op course complicated by VDRF and bil pleural effusions and requiring CVVHD. Extubated 7/31. Transitioned from CVVHD to HD on 8/14.  PMH - DM, MI, HTN,    PT Comments    Pt admitted with above. Pt currently with functional limitations due to balance and endurance deficits as well as decr participation. Pt will benefit from skilled PT to increase their independence and safety with mobility to allow discharge to the venue listed below.   Follow Up Recommendations  LTACH     Equipment Recommendations  Other (comment) (to be determined)    Recommendations for Other Services       Precautions / Restrictions Precautions Precautions: Fall Precaution Comments: multiple lines/tubes Restrictions Weight Bearing Restrictions: No    Mobility  Bed Mobility Overal bed mobility: Needs Assistance Bed Mobility: Rolling;Supine to Sit Rolling: Mod assist;+2 for physical assistance   Supine to sit: Max assist;+2 for physical assistance;HOB elevated     General bed mobility comments: Pt able to grasp rail and hold with left hand when turning to the rt.  Had to assist pt with LES and elevation of trunk to sit up to EOB.   Transfers                 General transfer comment: Could not get pt OOB as she is being cardioverted in a bit.  Ambulation/Gait                 Stairs            Wheelchair Mobility    Modified Rankin (Stroke Patients Only)       Balance Overall balance assessment: Needs assistance;History of Falls Sitting-balance support: Bilateral upper extremity supported;Feet supported Sitting balance-Leahy Scale: Zero Sitting balance - Comments: Pt at times requires max assist for sitting EOB.  Occasionally would initiate some trunk  movement and require mod assist.  Pt had a difficult time with anterior lean and was leaning heavily posteriorly throughout pt sitting EOB.  Pt sat a total of 10 min while NT washed her back and change sheets.  Pt also performed some exercises at EOB as well.   Postural control: Posterior lean                          Cognition Arousal/Alertness: Awake/alert Behavior During Therapy: Flat affect Overall Cognitive Status: Impaired/Different from baseline Area of Impairment: Problem solving       Following Commands: Follows one step commands consistently;Follows one step commands with increased time     Problem Solving: Requires verbal cues;Requires tactile cues General Comments: Will only answer with yes/no answers.    Exercises General Exercises - Lower Extremity Ankle Circles/Pumps: AAROM;Both;10 reps;Seated Long Arc Quad: AAROM;Both;10 reps;Seated Heel Slides: AAROM;Both;5 reps;Supine    General Comments        Pertinent Vitals/Pain Pain Assessment: Faces Faces Pain Scale: Hurts little more Pain Location: chest Pain Intervention(s): Repositioned;Monitored during session VSS.    Home Living                      Prior Function            PT Goals (current goals can now be found in the care plan section) Progress towards  PT goals: Progressing toward goals    Frequency  Min 2X/week    PT Plan Current plan remains appropriate    Co-evaluation             End of Session   Activity Tolerance: Patient tolerated treatment well Patient left: in bed;with call bell/phone within reach;with nursing/sitter in room     Time: 0930-1002 PT Time Calculation (min): 32 min  Charges:  $Therapeutic Exercise: 8-22 mins $Therapeutic Activity: 8-22 mins                    G Codes:      INGOLD,[name] 03/03/2014, 11:00 AM Audree Camelawn Ingold,PT Acute Rehabilitation [phone] (pager)

## 2014-03-03 NOTE — Progress Notes (Signed)
Patient ID: [name], female   DOB: [birth year], 73 y.o.   MRN: [id number] S:no new complaints O:BP 117/63  Pulse 101  Temp(Src) 97.8 F (36.6 C) (Oral)  Resp 26  Ht 5\' 7"  (1.702 m)  Wt 86.6 kg (190 lb 14.7 oz)  BMI 29.90 kg/m2  SpO2 95%  Intake/Output Summary (Last 24 hours) at 03/03/14 0901 Last data filed at 03/03/14 0830  Gross per 24 hour  Intake 1841.67 ml  Output   3100 ml  Net -1258.33 ml   Intake/Output: I/O last 3 completed shifts: In: 2955.5 [I.V.:1756.2; NG/GT:1199.3] Out: 3400 [Other:3000; Stool:400]  Intake/Output this shift:  Total I/O In: 84.1 [I.V.:74.1; NG/GT:10] Out: -  Weight change:  Gen:WD elderly WF in NAD CVS:no rub Resp:occ rhonchi CMK:LKJZPH XTA:VWPVXYI pretib edema   Recent Labs Lab 02/25/14 1600 02/26/14 0410 02/26/14 1527 02/27/14 0410 02/28/14 0548 03/01/14 0253 03/02/14 0259 03/03/14 0400  NA 135* 135*  136* 137 135* 136* 135* 132* 137  K 4.5 4.7  4.8 4.4 4.6 3.7 4.5 4.1 4.1  CL 96 97  98 97 97 97 94* 90* 95*  CO2 25 26  26 25 23   --  26 22 26   GLUCOSE 172* 116*  117* 130* 215* 244* 178* 175* 183*  BUN 23 23  23 19  39* 35* 34* 65* 30*  CREATININE 0.92 0.95  0.95 1.13* 2.21* 2.70* 2.25* 3.45* 2.52*  ALBUMIN 3.4* 3.5  3.4* 3.2* 3.1*  --  3.0* 2.8* 2.9*  CALCIUM 8.8 8.9  9.0 9.0 9.0  --  9.3 9.0 9.0  PHOS 3.3 2.7  2.6 2.3 3.8  --  4.5 6.6* 4.5  AST  --  29  --   --   --   --  23  --   ALT  --  17  --   --   --   --  14  --    Liver Function Tests:  Recent Labs Lab 02/26/14 0410  03/01/14 0253 03/02/14 0259 03/03/14 0400  AST 29  --   --  23  --   ALT 17  --   --  14  --   ALKPHOS 89  --   --  87  --   BILITOT 1.2  --   --  0.6  --   PROT 8.5*  --   --  7.4  --   ALBUMIN 3.5  3.4*  < > 3.0* 2.8* 2.9*  < > = values in this interval not displayed. No results found for this basename: LIPASE, AMYLASE,  in the last 168 hours No results found for this basename: AMMONIA,  in the last 168 hours CBC:  Recent  Labs Lab 02/27/14 0410 02/28/14 0248  03/01/14 0253 03/02/14 0259 03/03/14 0400  WBC 12.0* 12.3*  --  11.6* 12.2* 10.7*  HGB 8.0* 8.7*  < > 8.6* 8.4* 8.6*  HCT 26.1* 27.9*  < > 27.7* 26.6* 27.4*  MCV 99.6 95.2  --  94.9 93.0 94.2  PLT 215 220  --  209 233 191  < > = values in this interval not displayed. Cardiac Enzymes: No results found for this basename: CKTOTAL, CKMB, CKMBINDEX, TROPONINI,  in the last 168 hours CBG:  Recent Labs Lab 03/02/14 1601 03/02/14 2002 03/02/14 2320 03/03/14 0347 03/03/14 0811  GLUCAP 105* 187* 149* 196* 184*    Iron Studies: No results found for this basename: IRON, TIBC, TRANSFERRIN, FERRITIN,  in the last 72 hours Studies/Results: Dg Abd  1 View  03/02/2014   CLINICAL DATA:  Advanced feeding tube.  EXAM: ABDOMEN - 1 VIEW  COMPARISON:  Abdomen series 8/17 2015.  FINDINGS: Double right . tube is noted with its tip projected over the distal transverse portion of the duodenum. Contrast is noted in the distal duodenum and proximal jejunum. 0 min is 23 seconds of fluoroscopy is by report.  IMPRESSION: Double tube tip noted in the distal aspect of the transverse portion of the duodenum.   Electronically Signed   By: Maisie Fushomas  Register   On: 03/02/2014 15:41   Dg Chest Port 1 View  03/03/2014   CLINICAL DATA:  CABG, followup  EXAM: PORTABLE CHEST - 1 VIEW  COMPARISON:  Portable chest x-ray of 03/02/2014  FINDINGS: The edema pattern has improved with only mild congestion remaining. Cardiomegaly is stable. Central venous line is unchanged.  IMPRESSION: Improved aeration with improvement in edema pattern.   Electronically Signed   By: Dwyane DeePaul  Barry M.D.   On: 03/03/2014 08:06   Dg Chest Port 1 View  03/02/2014   CLINICAL DATA:  Interstitial edema  EXAM: PORTABLE CHEST - 1 VIEW  COMPARISON:  February 28, 2014  FINDINGS: There is interstitial edema diffusely, slightly increased compared to most recent prior study. There is some airspace consolidation in the left base  behind the left heart. There is cardiomegaly with pulmonary venous hypertension. Central catheter tip is in the superior vena cava. Feeding tube tip extends below the diaphragm. No pneumothorax.  IMPRESSION: Evidence of congestive heart failure increased interstitial edema compared to most recent prior study. Airspace consolidation behind the left heart may represent alveolar edema or superimposed pneumonia. No change in cardiac silhouette.   Electronically Signed   By: Bretta BangWilliam  Woodruff M.D.   On: 03/02/2014 07:12   Dg Abd Portable 1v  03/02/2014   CLINICAL DATA:  Feeding tube placement  EXAM: PORTABLE ABDOMEN - 1 VIEW  COMPARISON:  February 27, 2014  FINDINGS: Feeding tube tip is in the proximal body of the stomach region. Bowel gas pattern is normal. Temporary pacemaker wires are attached to the right heart.  IMPRESSION: Feeding tube tip in proximal body of stomach.   Electronically Signed   By: Bretta BangWilliam  Woodruff M.D.   On: 03/02/2014 07:34   Dg Vangie BickerNaso G Tube Plc W/fl-no Rad  03/02/2014   CLINICAL DATA: needs post pyloric FT   NASO G TUBE PLACEMENT WITH FLUORO  Fluoroscopy was utilized by the requesting physician.  No radiographic  interpretation.    Marland Kitchen. aspirin  324 mg Per Tube Daily  . bisacodyl  10 mg Oral Daily   Or  . bisacodyl  10 mg Rectal Daily  . chlorhexidine  15 mL Mouth Rinse BID  . darbepoetin (ARANESP) injection - DIALYSIS  60 mcg Intravenous Q Mon-HD  . Gerhardt's butt cream   Topical TID  . insulin aspart  0-24 Units Subcutaneous 6 times per day  . insulin glargine  20 Units Subcutaneous BID  . levothyroxine  75 mcg Per Tube QAC breakfast  . midodrine  10 mg Oral TID WC  . pantoprazole sodium  40 mg Per Tube Q1200  . potassium & sodium phosphates  1 packet Per Tube TID  . sodium chloride  10-40 mL Intracatheter Q12H  . warfarin  4 mg Oral q1800  . Warfarin - Physician Dosing Inpatient   Does not apply q1800    BMET    Component Value Date/Time   NA 137 03/03/2014 0400  K  4.1 03/03/2014 0400   CL 95* 03/03/2014 0400   CO2 26 03/03/2014 0400   GLUCOSE 183* 03/03/2014 0400   BUN 30* 03/03/2014 0400   CREATININE 2.52* 03/03/2014 0400   CALCIUM 9.0 03/03/2014 0400   GFRNONAA 18* 03/03/2014 0400   GFRAA 21* 03/03/2014 0400   CBC    Component Value Date/Time   WBC 10.7* 03/03/2014 0400   RBC 2.91* 03/03/2014 0400   HGB 8.6* 03/03/2014 0400   HCT 27.4* 03/03/2014 0400   PLT 191 03/03/2014 0400   MCV 94.2 03/03/2014 0400   MCH 29.6 03/03/2014 0400   MCHC 31.4 03/03/2014 0400   RDW 18.0* 03/03/2014 0400   LYMPHSABS 2.7 01/26/2014 1030   MONOABS 1.0 01/26/2014 1030   EOSABS 0.0 01/26/2014 1030   BASOSABS 0.0 01/26/2014 1825     73 year old admitted 7/12 with code STEMI underwent cath and CABG x 4 V and MVR 7/13 requiring ballon pump. Complicated by pulmonary HTN and s/p R thoracostomy for pleural effusion. She also had cardiogenic shock that requires pressor treatment and was started on CVVHD on 02/11/14 transitioned to IHD on 02/27/14.   Assessment/Plan:  1. AKI, oliguric and dialysis-dependent since 02/11/14. Transitioned from CVVHD to IHD 02/27/14 and second on 02/28/14.   1. Tolerated HD yesterday with 3L UF however weight was only 1.5kg difference post HD. Will need to concentrate drips to minimize intake 2. Volume status has improved and cont to follow.  3. Cont to follow closely but will likely require 4 HD sessions/week given ongoing issues with hypotension and need for pressors. 2. Vascular access will need tunneled HD catheter. Appreciate IR assistance with placement of tunneled dialysis catheter.  Will need eventual VVS evaluation for longterm vascular access although will be an issue while she is on coumadin and cardiogenic shock. 3. CAD s/p CABG- still with hypotension requiring pressors 4. Cardiogenic shock/RV failure- cards following 5. Afib/flutter- on coumadin 6. 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 improved 7. Anemia- ABLA/acute illness and AKI. Will  start Aranesp this week 8. AMS- improved per nsg. 9. Dispo- for LTC facility placement per CTS.  Maddix Kliewer A

## 2014-03-03 NOTE — Significant Event (Signed)
Bladder scan done and showed approximately 100cc of urine in bladder. Will continue to monitor. Ekam Besson, Charity fundraiser.

## 2014-03-03 NOTE — Sedation Documentation (Signed)
Pt connected to temporary pacemaker VVI at 60

## 2014-03-03 NOTE — Progress Notes (Signed)
37 Days Post-Op Procedure(s) (LRB): CORONARY ARTERY BYPASS GRAFTING TIMES FOUR USING LEFT INTERNAL MAMMARY ARTERY TO LAD, SAPHENOUS VEIN GRAFTS TO OM1, OM2, AND PDA (N/A) MITRAL VALVE (MV) REPLACEMENT (N/A) Subjective: Cont chronic care after emerg CABG MVR for MI, shock Postop ATN now HD dependent Slowly weaning NE for post inf wall and RV infarcts\ Panda replaced post pyloric Postop recurrent afib- DCCV planned Tunneled HD cath planned prior to LTAC - patient very weak and req hoyer lift to get OOB  Objective: Vital signs in last 24 hours: Temp:  [97.5 F (36.4 C)-98.6 F (37 C)] 97.8 F (36.6 C) (08/18 0814) Pulse Rate:  [97-119] 101 (08/18 0700) Cardiac Rhythm:  [-] Atrial fibrillation (08/18 0600) Resp:  [16-30] 27 (08/18 0700) BP: (79-162)/(33-128) 107/59 mmHg (08/18 0700) SpO2:  [92 %-99 %] 95 % (08/18 0700) Weight:  [190 lb 11.2 oz (86.5 kg)-192 lb 14.4 oz (87.5 kg)] 190 lb 14.7 oz (86.6 kg) (08/18 0500)  Hemodynamic parameters for last 24 hours: CVP:  [5 mmHg-13 mmHg] 8 mmHg  Intake/Output from previous day: 08/17 0701 - 08/18 0700 In: 1890.2 [I.V.:1180.9; NG/GT:709.3] Out: 3100 [Stool:100] Intake/Output this shift: Total I/O In: 10 [NG/GT:10] Out: -   Flat affect- baseline extrem warm abd soft  Lab Results:  Recent Labs  03/02/14 0259 03/03/14 0400  WBC 12.2* 10.7*  HGB 8.4* 8.6*  HCT 26.6* 27.4*  PLT 233 191   BMET:  Recent Labs  03/02/14 0259 03/03/14 0400  NA 132* 137  K 4.1 4.1  CL 90* 95*  CO2 22 26  GLUCOSE 175* 183*  BUN 65* 30*  CREATININE 3.45* 2.52*  CALCIUM 9.0 9.0    PT/INR:  Recent Labs  03/03/14 0400  LABPROT 16.3*  INR 1.31   ABG    Component Value Date/Time   PHART 7.463* 02/17/2014 1013   HCO3 26.6* 02/17/2014 1013   TCO2 23 02/28/2014 0548   ACIDBASEDEF 0.1 01/30/2014 0339   O2SAT 61.7 03/03/2014 0535   CBG (last 3)   Recent Labs  03/02/14 2002 03/02/14 2320 03/03/14 0347  GLUCAP 187* 149* 196*     Assessment/Plan: S/P Procedure(s) (LRB): CORONARY ARTERY BYPASS GRAFTING TIMES FOUR USING LEFT INTERNAL MAMMARY ARTERY TO LAD, SAPHENOUS VEIN GRAFTS TO OM1, OM2, AND PDA (N/A) MITRAL VALVE (MV) REPLACEMENT (N/A) Cont coumadin loading for afib and MVR Cont heparin until INR 2 Remove EPW's tomorrow   LOS: 37 days    VAN TRIGT III,PETER 03/03/2014

## 2014-03-03 NOTE — Progress Notes (Signed)
TCTS BRIEF SICU PROGRESS NOTE  Day of Surgery  S/P Procedure(s) (LRB): CARDIOVERSION (N/A)   Tunneled HD cath placed earlier today S/P DCCV Currently stable  Plan: Continue present plan  [name],Brooke H 03/03/2014 7:04 PM

## 2014-03-03 NOTE — CV Procedure (Signed)
    Cardioversion Note  [name] [id number] 05/23/1941  Procedure: DC Cardioversion Indications: atrial flutter   Procedure Details Consent: Obtained Time Out: Verified patient identification, verified procedure, site/side was marked, verified correct patient position, special equipment/implants available, Radiology Safety Procedures followed,  medications/allergies/relevent history reviewed, required imaging and test results available.  Performed  The patient has been on adequate anticoagulation.  The patient received Lidocaine 40 mg iv followed by  Propofol 50 mg  IV for sedation.  Synchronous cardioversion was performed at 120 joules.  The cardioversion was successful    Complications: No apparent complications Patient did tolerate procedure well.   [name], Brooke Hageman., MD, [facility] 03/03/2014, 11:28 AM

## 2014-03-03 NOTE — Sedation Documentation (Signed)
Pt was not sedated.

## 2014-03-03 NOTE — Significant Event (Signed)
Patient currently present with pressure ulcer on peri-rectal area from device-related (flexiseal), found during bath today. Spoke with Dr. Donata Clay about this. New order received to discontinue it. RN d/c flexiseal and applied Gerhardt's cream on site and sacrum per Dr. Zenaida Niece Trigt's verbal order. Gaytha Raybourn, Charity fundraiser.

## 2014-03-03 NOTE — Sedation Documentation (Signed)
Pt arrived with milrinone at 0.53mck/kg/min Levophed 5.5 mcg/min Amiodarone 30 mg/hr infusing

## 2014-03-03 NOTE — Significant Event (Signed)
Bedside cardioversion done by Dr. Elease Hashimoto with anesthesiologist and CRNA present. Patient tolerated procedure well.  VS stable during and post proceudre. Patient in sinus in the 90s after the cardioversion. Patient is arousable at this time. Will continue to monitor. Shama Monfils, Charity fundraiser.

## 2014-03-03 NOTE — Progress Notes (Signed)
NUTRITION FOLLOW UP  INTERVENTION:  Continue Nepro at 40 ml/hr  Re-order Prostat liquid protein 30 ml QID  Total TF regimen to provide 2128 kcals, 138 gm protein, 501 ml of free water RD to follow for nutrition care plan  NUTRITION DIAGNOSIS: Inadequate oral intake related to inability to eat as evidenced by NPO status, ongoing  New Goal: Pt to meet >/= 90% of their estimated nutrition needs, met  Monitor:  TF regimen & tolerance, PO diet advancement, weight, labs, I/O's  ASSESSMENT: PMHx significant for CAD, TIA, HTN, DM2, PAD. Admitted with n/v, diaphoresis and chest pain. Work-up reveals STEMI.  Underwent L heart catheterization with coronary angiogram on 7/12. Work-up reveals severe multivessel CAD. Pt then went to OR for emergent CABG x 4 on 7/13.  Patient extubated 7/31.  Transitioned from CVVHD to IHD 8/14.  S/p FEES 8/18.  Patient demonstrated moderate dysphagia.  SLP recommending continued NPO status at this time.    Per RN, pulled Panda tube over weekend.  New tube placed 8/17 (tip in proximal body of stomach) -- Nepro formula currently infusing at 40 ml/hr.  Prostat liquid protein D/C'd 8/17.  Disposition: LTACH.  Height: Ht Readings from Last 1 Encounters:  01/31/14 5' 7"  (1.702 m)    Weight: -----> stable Wt Readings from Last 1 Encounters:  03/03/14 190 lb 14.7 oz (86.6 kg)    8/17  190 lb 8/15  187 lb 8/14  192 lb 8/13  183 lb 8/12  180 lb 8/11  183 lb 8/10  183 lb  BMI:  Body mass index is 29.9 kg/(m^2).   Re-estimated Nutritional Needs: Kcal: 2000-2200 Protein: 125-135 gm Fluid: per MD  Skin: leg and chest incisions  Diet Order: NPO   Intake/Output Summary (Last 24 hours) at 03/03/14 1519 Last data filed at 03/03/14 1500  Gross per 24 hour  Intake 2028.66 ml  Output    100 ml  Net 1928.66 ml    Labs:   Recent Labs Lab 03/01/14 0253 03/02/14 0259 03/03/14 0400  NA 135* 132* 137  K 4.5 4.1 4.1  CL 94* 90* 95*  CO2 26 22  26   BUN 34* 65* 30*  CREATININE 2.25* 3.45* 2.52*  CALCIUM 9.3 9.0 9.0  MG 2.1 2.0 2.0  PHOS 4.5 6.6* 4.5  GLUCOSE 178* 175* 183*    CBG (last 3)   Recent Labs  03/03/14 0347 03/03/14 0811 03/03/14 1210  GLUCAP 196* 184* 130*    Scheduled Meds: . aspirin  324 mg Per Tube Daily  . bisacodyl  10 mg Oral Daily   Or  . bisacodyl  10 mg Rectal Daily  . chlorhexidine  15 mL Mouth Rinse BID  . darbepoetin (ARANESP) injection - DIALYSIS  60 mcg Intravenous Q Mon-HD  . Gerhardt's butt cream   Topical TID  . heparin      . insulin aspart  0-24 Units Subcutaneous 6 times per day  . insulin glargine  30 Units Subcutaneous BID  . levothyroxine  75 mcg Per Tube QAC breakfast  . lidocaine (PF)      . midodrine  10 mg Oral TID WC  . pantoprazole sodium  40 mg Per Tube Q1200  . potassium & sodium phosphates  1 packet Per Tube TID  . sodium chloride  10-40 mL Intracatheter Q12H  . warfarin  4 mg Oral q1800  . Warfarin - Physician Dosing Inpatient   Does not apply q1800    Continuous Infusions: . sodium chloride Stopped (  02/20/14 0700)  . sodium chloride Stopped (02/20/14 1830)  . amiodarone 30 mg/hr (03/03/14 1500)  . feeding supplement (NEPRO CARB STEADY) Stopped (03/03/14 0715)  . heparin 1,750 Units/hr (03/03/14 1500)  . milrinone 0.25 mcg/kg/min (03/03/14 1500)  . norepinephrine (LEVOPHED) Adult infusion 4.5 mcg/min (03/03/14 1500)    Past Medical History  Diagnosis Date  . History of MI (myocardial infarction)     PCI done @ [facility] (cannot get cath report on Care Everywhere(  . Essential hypertension   . TIA (transient ischemic attack)   . Thyroid disease   . Anxiety   . Diabetes mellitus type 2 with peripheral artery disease     On insulin  . PAD (peripheral artery disease)   . Hyperlipidemia associated with type 2 diabetes mellitus   . A-fib   . ARF (acute renal failure)     Past Surgical History  Procedure Laterality Date  . Cardiac catheterization       PCI - unknown vessel or stent; unknown date; @ [facility].  . Abdominal hysterectomy    . Cholecystectomy    . Coronary artery bypass graft N/A 01/25/2014    Procedure: CORONARY ARTERY BYPASS GRAFTING TIMES FOUR USING LEFT INTERNAL MAMMARY ARTERY TO LAD, SAPHENOUS VEIN GRAFTS TO OM1, OM2, AND PDA;  Surgeon: Ivin Poot, MD;  Location: [place];  Service: Open Heart Surgery;  Laterality: N/A;  . Mitral valve replacement N/A 01/25/2014    Procedure: MITRAL VALVE (MV) REPLACEMENT;  Surgeon: Ivin Poot, MD;  Location: [place];  Service: Open Heart Surgery;  Laterality: N/A;    Arthur Holms, RD, LDN Pager #: [phone] After-Hours Pager #: [phone]

## 2014-03-04 ENCOUNTER — Inpatient Hospital Stay (HOSPITAL_COMMUNITY): Payer: Medicare Other

## 2014-03-04 LAB — CARBOXYHEMOGLOBIN
Carboxyhemoglobin: 1.9 % — ABNORMAL HIGH (ref 0.5–1.5)
Methemoglobin: 1 % (ref 0.0–1.5)
O2 Saturation: 53 %
Total hemoglobin: 8.4 g/dL — ABNORMAL LOW (ref 12.0–16.0)

## 2014-03-04 LAB — HEPARIN LEVEL (UNFRACTIONATED)
Heparin Unfractionated: 0.2 IU/mL — ABNORMAL LOW (ref 0.30–0.70)
Heparin Unfractionated: 0.15 IU/mL — ABNORMAL LOW (ref 0.30–0.70)

## 2014-03-04 LAB — GLUCOSE, CAPILLARY
Glucose-Capillary: 142 mg/dL — ABNORMAL HIGH (ref 70–99)
Glucose-Capillary: 162 mg/dL — ABNORMAL HIGH (ref 70–99)
Glucose-Capillary: 162 mg/dL — ABNORMAL HIGH (ref 70–99)
Glucose-Capillary: 165 mg/dL — ABNORMAL HIGH (ref 70–99)
Glucose-Capillary: 175 mg/dL — ABNORMAL HIGH (ref 70–99)

## 2014-03-04 LAB — RENAL FUNCTION PANEL
Albumin: 2.7 g/dL — ABNORMAL LOW (ref 3.5–5.2)
Anion gap: 17 — ABNORMAL HIGH (ref 5–15)
BUN: 44 mg/dL — ABNORMAL HIGH (ref 6–23)
CO2: 26 mEq/L (ref 19–32)
Calcium: 9.3 mg/dL (ref 8.4–10.5)
Chloride: 92 mEq/L — ABNORMAL LOW (ref 96–112)
Creatinine, Ser: 3.79 mg/dL — ABNORMAL HIGH (ref 0.50–1.10)
GFR calc Af Amer: 13 mL/min — ABNORMAL LOW (ref >90)
GFR calc non Af Amer: 11 mL/min — ABNORMAL LOW (ref >90)
Glucose, Bld: 172 mg/dL — ABNORMAL HIGH (ref 70–99)
Phosphorus: 5.8 mg/dL — ABNORMAL HIGH (ref 2.3–4.6)
Potassium: 3.9 mEq/L (ref 3.7–5.3)
Sodium: 135 mEq/L — ABNORMAL LOW (ref 137–147)

## 2014-03-04 LAB — CBC
HCT: 25.8 % — ABNORMAL LOW (ref 36.0–46.0)
Hemoglobin: 8.2 g/dL — ABNORMAL LOW (ref 12.0–15.0)
MCH: 29.7 pg (ref 26.0–34.0)
MCHC: 31.8 g/dL (ref 30.0–36.0)
MCV: 93.5 fL (ref 78.0–100.0)
Platelets: 214 10*3/uL (ref 150–400)
RBC: 2.76 MIL/uL — ABNORMAL LOW (ref 3.87–5.11)
RDW: 17.9 % — ABNORMAL HIGH (ref 11.5–15.5)
WBC: 11.6 10*3/uL — ABNORMAL HIGH (ref 4.0–10.5)

## 2014-03-04 LAB — PROTIME-INR
INR: 1.39 (ref 0.00–1.49)
Prothrombin Time: 17.1 seconds — ABNORMAL HIGH (ref 11.6–15.2)

## 2014-03-04 LAB — MAGNESIUM: Magnesium: 2.1 mg/dL (ref 1.5–2.5)

## 2014-03-04 MED ORDER — HEPARIN SODIUM (PORCINE) 1000 UNIT/ML DIALYSIS: 20.0000 [IU]/kg | INTRAMUSCULAR | Status: DC | PRN

## 2014-03-04 MED ORDER — ALBUMIN HUMAN 25 % IV SOLN
12.5000 g | Freq: Once | INTRAVENOUS | Status: AC
  Administered 2014-03-04: 12.5 g via INTRAVENOUS
  Filled 2014-03-04: qty 50

## 2014-03-04 MED ORDER — WARFARIN SODIUM 5 MG PO TABS
5.0000 mg | ORAL_TABLET | Freq: Every day | ORAL | Status: DC
  Administered 2014-03-04 – 2014-03-06 (×3): 5 mg via ORAL
  Filled 2014-03-04 (×3): qty 1

## 2014-03-04 NOTE — Progress Notes (Signed)
Patient ID: [name], female   DOB: 1940-12-08, 73 y.o.   MRN: [id number] S:more awake/alert and verbal this am O:BP 92/47  Pulse 90  Temp(Src) 97.2 F (36.2 C) (Axillary)  Resp 27  Ht 5\' 7"  (1.702 m)  Wt 87.3 kg (192 lb 7.4 oz)  BMI 30.14 kg/m2  SpO2 97%  Intake/Output Summary (Last 24 hours) at 03/04/14 0830 Last data filed at 03/04/14 0800  Gross per 24 hour  Intake 2096.47 ml  Output     25 ml  Net 2071.47 ml   Intake/Output: I/O last 3 completed shifts: In: 3318 [I.V.:1734.7; NG/GT:1533.3; IV Piggyback:50] Out: 75 [Stool:75]  Intake/Output this shift:  Total I/O In: 67.2 [I.V.:27.2; NG/GT:40] Out: -  Weight change: 2 kg (4 lb 6.6 oz) Gen:WD elderly WF in NAD, slowed mentation but appropriate CVS:RRR no rub Resp:decreased BS at bases WGN:FAOZHY Ext:no edema   Recent Labs Lab 02/26/14 0410 02/26/14 1527 02/27/14 0410 02/28/14 0548 03/01/14 0253 03/02/14 0259 03/03/14 0400 03/04/14 0415  NA 135*  136* 137 135* 136* 135* 132* 137 135*  K 4.7  4.8 4.4 4.6 3.7 4.5 4.1 4.1 3.9  CL 97  98 97 97 97 94* 90* 95* 92*  CO2 26  26 25 23   --  26 22 26 26   GLUCOSE 116*  117* 130* 215* 244* 178* 175* 183* 172*  BUN 23  23 19  39* 35* 34* 65* 30* 44*  CREATININE 0.95  0.95 1.13* 2.21* 2.70* 2.25* 3.45* 2.52* 3.79*  ALBUMIN 3.5  3.4* 3.2* 3.1*  --  3.0* 2.8* 2.9* 2.7*  CALCIUM 8.9  9.0 9.0 9.0  --  9.3 9.0 9.0 9.3  PHOS 2.7  2.6 2.3 3.8  --  4.5 6.6* 4.5 5.8*  AST 29  --   --   --   --  23  --   --   ALT 17  --   --   --   --  14  --   --    Liver Function Tests:  Recent Labs Lab 02/26/14 0410  03/02/14 0259 03/03/14 0400 03/04/14 0415  AST 29  --  23  --   --   ALT 17  --  14  --   --   ALKPHOS 89  --  87  --   --   BILITOT 1.2  --  0.6  --   --   PROT 8.5*  --  7.4  --   --   ALBUMIN 3.5  3.4*  < > 2.8* 2.9* 2.7*  < > = values in this interval not displayed. No results found for this basename: LIPASE, AMYLASE,  in the last 168 hours No  results found for this basename: AMMONIA,  in the last 168 hours CBC:  Recent Labs Lab 02/28/14 0248  03/01/14 0253 03/02/14 0259 03/03/14 0400 03/04/14 0415  WBC 12.3*  --  11.6* 12.2* 10.7* 11.6*  HGB 8.7*  < > 8.6* 8.4* 8.6* 8.2*  HCT 27.9*  < > 27.7* 26.6* 27.4* 25.8*  MCV 95.2  --  94.9 93.0 94.2 93.5  PLT 220  --  209 233 191 214  < > = values in this interval not displayed. Cardiac Enzymes: No results found for this basename: CKTOTAL, CKMB, CKMBINDEX, TROPONINI,  in the last 168 hours CBG:  Recent Labs Lab 03/03/14 1210 03/03/14 1627 03/03/14 1929 03/03/14 2339 03/04/14 0356  GLUCAP 130* 151* 117* 189* 175*    Iron Studies: No results  found for this basename: IRON, TIBC, TRANSFERRIN, FERRITIN,  in the last 72 hours Studies/Results: Dg Abd 1 View  03/02/2014   CLINICAL DATA:  Advanced feeding tube.  EXAM: ABDOMEN - 1 VIEW  COMPARISON:  Abdomen series 8/17 2015.  FINDINGS: Double right . tube is noted with its tip projected over the distal transverse portion of the duodenum. Contrast is noted in the distal duodenum and proximal jejunum. 0 min is 23 seconds of fluoroscopy is by report.  IMPRESSION: Double tube tip noted in the distal aspect of the transverse portion of the duodenum.   Electronically Signed   By: Maisie Fushomas  Register   On: 03/02/2014 15:41   Ir Fluoro Guide Cv Line Right  03/03/2014   CLINICAL DATA:  Renal failure  EXAM: TUNNELED DIALYSIS CATHETER PLACEMENT, ULTRASOUND GUIDANCE FOR VASCULAR ACCESS  FLUOROSCOPY TIME:  12 seconds.  MEDICATIONS AND MEDICAL HISTORY: None  As antibiotic prophylaxis, Ancef was ordered pre-procedure and administered intravenously within one hour of incision.  ANESTHESIA/SEDATION: None  CONTRAST:  None  PROCEDURE: The procedure, risks, benefits, and alternatives were explained to the patient. Questions regarding the procedure were encouraged and answered. The patient understands and consents to the procedure.  The right chest was prepped  with dated in a sterile fashion, and a sterile drape was applied covering the operative field. A sterile gown and sterile gloves were used for the procedure. 1% lidocaine into the skin and subcutaneous tissue. The right internal jugular vein was noted to be patent initially with ultrasound. Under sonographic guidance, a micropuncture needle was inserted into the right IJ vein (Ultrasound and fluoroscopic image documentation was performed). It was removed over an 018 wire which was upsized to an Amplatz. This was advanced into the IVC.  A small incision was made in the right upper chest. The tunneling device was utilized to advance the 23 centimeter tip to cuff catheter from the chest incision and out the neck incision. A peel-away sheath was advanced over the Amplatz wire. The leading edge of the catheter was then advanced through the peel-away sheath. The peel-away sheath was removed. It was flushed and instilled with heparin. The chest incision was closed with a 0 Prolene pursestring stitch. The neck incision was closed with a 4-0 Vicryl subcuticular stitch. No complications.  FINDINGS: The image demonstrates placement of a tunneled dialysis catheter with its tip in the right atrium.  IMPRESSION: Successful right IJ vein tunneled dialysis catheter with its tip in the right atrium.   Electronically Signed   By: Maryclare BeanArt  Hoss M.D.   On: 03/03/2014 17:13   Ir [place] Guide Vasc Access Right  03/03/2014   CLINICAL DATA:  Renal failure  EXAM: TUNNELED DIALYSIS CATHETER PLACEMENT, ULTRASOUND GUIDANCE FOR VASCULAR ACCESS  FLUOROSCOPY TIME:  12 seconds.  MEDICATIONS AND MEDICAL HISTORY: None  As antibiotic prophylaxis, Ancef was ordered pre-procedure and administered intravenously within one hour of incision.  ANESTHESIA/SEDATION: None  CONTRAST:  None  PROCEDURE: The procedure, risks, benefits, and alternatives were explained to the patient. Questions regarding the procedure were encouraged and answered. The patient  understands and consents to the procedure.  The right chest was prepped with dated in a sterile fashion, and a sterile drape was applied covering the operative field. A sterile gown and sterile gloves were used for the procedure. 1% lidocaine into the skin and subcutaneous tissue. The right internal jugular vein was noted to be patent initially with ultrasound. Under sonographic guidance, a micropuncture needle was inserted into the right  IJ vein (Ultrasound and fluoroscopic image documentation was performed). It was removed over an 018 wire which was upsized to an Amplatz. This was advanced into the IVC.  A small incision was made in the right upper chest. The tunneling device was utilized to advance the 23 centimeter tip to cuff catheter from the chest incision and out the neck incision. A peel-away sheath was advanced over the Amplatz wire. The leading edge of the catheter was then advanced through the peel-away sheath. The peel-away sheath was removed. It was flushed and instilled with heparin. The chest incision was closed with a 0 Prolene pursestring stitch. The neck incision was closed with a 4-0 Vicryl subcuticular stitch. No complications.  FINDINGS: The image demonstrates placement of a tunneled dialysis catheter with its tip in the right atrium.  IMPRESSION: Successful right IJ vein tunneled dialysis catheter with its tip in the right atrium.   Electronically Signed   By: Maryclare Bean M.D.   On: 03/03/2014 17:13   Dg Chest Port 1 View  03/04/2014   CLINICAL DATA:  Pulmonary edema.  EXAM: PORTABLE CHEST - 1 VIEW  COMPARISON:  03/03/2014.  FINDINGS: Bilateral dual-lumen IJ catheters in stable position. NG tube in good anatomic position. Prior CABG and cardiac valve replacement. Cardiomegaly. Interim improvement of pulmonary venous congestion and bilateral pulmonary edema. Left-sided pleural effusion. No pneumothorax.  IMPRESSION: 1. Lines and tubes in good anatomic position. 2. Interim improvement of  congestive heart failure and pulmonary edema. Persistent left pleural effusion. 3. Prior CABG cardiac valve replacement.   Electronically Signed   By: Maisie Fus  Register   On: 03/04/2014 07:47   Dg Chest Port 1 View  03/03/2014   CLINICAL DATA:  CABG, followup  EXAM: PORTABLE CHEST - 1 VIEW  COMPARISON:  Portable chest x-ray of 03/02/2014  FINDINGS: The edema pattern has improved with only mild congestion remaining. Cardiomegaly is stable. Central venous line is unchanged.  IMPRESSION: Improved aeration with improvement in edema pattern.   Electronically Signed   By: Dwyane Dee M.D.   On: 03/03/2014 08:06   Dg Vangie Bicker G Tube Plc W/fl-no Rad  03/02/2014   CLINICAL DATA: needs post pyloric FT   NASO G TUBE PLACEMENT WITH FLUORO  Fluoroscopy was utilized by the requesting physician.  No radiographic  interpretation.    Marland Kitchen aspirin  324 mg Per Tube Daily  . bisacodyl  10 mg Oral Daily   Or  . bisacodyl  10 mg Rectal Daily  . chlorhexidine  15 mL Mouth Rinse BID  . darbepoetin (ARANESP) injection - DIALYSIS  60 mcg Intravenous Q Mon-HD  . feeding supplement (PRO-STAT SUGAR FREE 64)  30 mL Per Tube QID  . Gerhardt's butt cream   Topical TID  . insulin aspart  0-24 Units Subcutaneous 6 times per day  . insulin glargine  30 Units Subcutaneous BID  . levothyroxine  75 mcg Per Tube QAC breakfast  . midodrine  10 mg Oral TID WC  . pantoprazole sodium  40 mg Per Tube Q1200  . potassium & sodium phosphates  1 packet Per Tube TID  . sodium chloride  10-40 mL Intracatheter Q12H  . warfarin  4 mg Oral q1800  . Warfarin - Physician Dosing Inpatient   Does not apply q1800    BMET    Component Value Date/Time   NA 135* 03/04/2014 0415   K 3.9 03/04/2014 0415   CL 92* 03/04/2014 0415   CO2 26 03/04/2014 0415   GLUCOSE  172* 03/04/2014 0415   BUN 44* 03/04/2014 0415   CREATININE 3.79* 03/04/2014 0415   CALCIUM 9.3 03/04/2014 0415   GFRNONAA 11* 03/04/2014 0415   GFRAA 13* 03/04/2014 0415   CBC    Component  Value Date/Time   WBC 11.6* 03/04/2014 0415   RBC 2.76* 03/04/2014 0415   HGB 8.2* 03/04/2014 0415   HCT 25.8* 03/04/2014 0415   PLT 214 03/04/2014 0415   MCV 93.5 03/04/2014 0415   MCH 29.7 03/04/2014 0415   MCHC 31.8 03/04/2014 0415   RDW 17.9* 03/04/2014 0415   LYMPHSABS 2.7 01/26/2014 1030   MONOABS 1.0 01/26/2014 1030   EOSABS 0.0 01/26/2014 1030   BASOSABS 0.0 01/26/2014 1169    73 year old admitted 7/12 with code STEMI underwent cath and CABG x 4 V and MVR 7/13 requiring ballon pump. Complicated by pulmonary HTN and s/p R thoracostomy for pleural effusion. She also had cardiogenic shock that requires pressor treatment and was started on CVVHD on 02/11/14 transitioned to IHD on 02/27/14.  Assessment/Plan:  1. AKI, oliguric and dialysis-dependent since 02/11/14. Transitioned from CVVHD to IHD 02/27/14 and second on 02/28/14.  1. Tolerated HD with 3L UF however weight was only 1.5kg difference post HD. Will need to concentrate drips to minimize intake 2. Volume status has improved and cont to follow.  3. Cont to follow closely and see how she tolerates 3 sessions/week of HD given ongoing issues with hypotension and need for pressors. 2. Vascular access- s/p RIJ TDC per IR on 03/03/14.Will need eventual VVS evaluation for longterm vascular access although will be an issue while she is on coumadin and ongoing issues with cardiogenic shock/RV failure. 1. D/c temp trialysis catheter 3. CAD s/p CABG- still with hypotension requiring pressors 4. Cardiogenic shock/RV failure- cards following 5. Afib/flutter- on coumadin s/p DC cardioversion on 03/03/14 which was successful 6. 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 improved 7. Anemia- ABLA/acute illness and AKI. Will start Aranesp this week 8. AMS- improved per nsg. 9. Protein malnutrition- cont with tube feeds 10. Dispo- for LTC facility placement per CTS. 11.   Cylinda Santoli A

## 2014-03-04 NOTE — Progress Notes (Signed)
Speech Language Pathology Treatment: Dysphagia  Patient Details Name: [name] MRN: [id number] DOB: [birth year] Today's Date: 03/04/2014 Time: 0810-0825 SLP Time Calculation (min): 15 min  Assessment / Plan / Recommendation Clinical Impression  Pt demonstrates improved participation today with SLP providing moderate contextual cues for self feeding nectar thick liquids. Pt with no immediate signs of aspiration with minimal trials, but still unable to determine pt ability to truly tolerate POs given she will only accept 3 sips and then refuses. Hopeful for progression to starting a modified diet with ongoing alternate nutrition when pt can be observed tolerating thickened liquids over multiple trials.    HPI HPI: Patient with past medical history of DM, MI, HTN, admitted with MI and underwent emergent CABG x 4 and MVR on 7/13. Post op course complicated by VDRF and bilateral pleural effusions and requiring CVVHD. Extubated 7/31.    Pertinent Vitals Pain Assessment: Faces Faces Pain Scale: Hurts a little bit  SLP Plan  Continue with current plan of care    Recommendations Diet recommendations: NPO Medication Administration: Via alternative means Supervision: Full supervision/cueing for compensatory strategies              Oral Care Recommendations: Oral care Q4 per protocol Follow up Recommendations: LTACH;24 hour supervision/assistance Plan: Continue with current plan of care    GO    [facility] [place] CCC-SLP [phone]  Claudine Mouton 03/04/2014, 10:43 AM

## 2014-03-04 NOTE — Progress Notes (Signed)
Patient ID: [name], female   DOB: Jan 15, 1941, 73 y.o.   MRN: [id number] Advanced Heart Failure Rounding Note   Subjective:    [name] is a 73 yo female with a history of CAD s/p PCI at [facility], HTN, TIA, DM2 and PAD. She presented to [facility] 01/25/14 with CP,Vtach and ST changes in inferior leads and was taken to the cath lab. She had severe multivessel CAD involving 100% distal RCA with tandem 90% & 80% lesions, proximal D1 100%, proximal LAD & AVGroove Circ 90% with 80% OM2 and was evaluated by Dr. Maren Beach for CABG and IABP was placed.  Underwent emergent CABGx4 and MV replacement (LIMA-LAD, SVG to OM1, SVG to OM2 and SVG to PDA) on 01/26/14. She was transferred to the ICU on multiple pressors and IABP. UOP was sluggish and remained volume overloaded and lasix gtt was started. IABP removed 7/17. TF started for nutrition. Had difficulty with thrombocytopenia which improved and HIT studies negative and Coumadin was restarted. Underwent thoracentesis on R on 02/06/14. Extubated 7/25 but re-intubated 7/27 electively for therapeutic bronchoscopy showing no significant mucus, airways clear w/ exception of what appeared to be suction trauma at the inlet of the RUL. Underwent L CT by IR on 7/27. Her Creatinine was stable for awhile and then started trending up even on milrinone. Underwent HD cath placement 7/28 and started on CVVHD on 7/29. Extubated again 02/13/14. Has had issues with Afib/Aflutter.   ECHO 02/17/14. Reviewed personally EF 35-40% Inferior wall out. RV moderately HK with TAPSE 1.18cm  Off CVVHD and plan for HD 3x week. Cardioverted yesterday and is maintaining NSR. Co-ox down to 53% and remains on milrinone 0.25 mcg, levophed 4.5 mcg/min and amiodarone 30 mg/hr. SBP 90-100s. CVP 9. Remains anuric. Remains confused at times.     Objective:   Weight Range:  Vital Signs:   Temp:  [97.2 F (36.2 C)-98.5 F (36.9 C)] 97.5 F (36.4 C) (08/19 1644) Pulse Rate:  [90-102] 94 (08/19 1700) Resp:   [16-28] 26 (08/19 1700) BP: (89-126)/(44-68) 109/61 mmHg (08/19 1700) SpO2:  [91 %-100 %] 97 % (08/19 1700) Weight:  [87.3 kg (192 lb 7.4 oz)-88 kg (194 lb 0.1 oz)] 88 kg (194 lb 0.1 oz) (08/19 1100) Last BM Date: 03/03/14  Weight change: Filed Weights   03/03/14 0500 03/04/14 0500 03/04/14 1100  Weight: 86.6 kg (190 lb 14.7 oz) 87.3 kg (192 lb 7.4 oz) 88 kg (194 lb 0.1 oz)    Intake/Output:   Intake/Output Summary (Last 24 hours) at 03/04/14 1720 Last data filed at 03/04/14 1700  Gross per 24 hour  Intake 1974.3 ml  Output   3026 ml  Net -1051.7 ml     Physical Exam: CVP 8  General:  Chronically ill appearing. No resp difficulty, confused HEENT: normal; Panda intact; L IJ HD cath Neck: supple. JVP 8-9 Carotids 2+ bilat; no bruits. No lymphadenopathy or thryomegaly appreciated. Cor: PMI nondisplaced. Regular rate & irregular rhythm. No rubs, gallops or murmurs. Lungs: Diminished in the bases Abdomen: soft, nontender, nondistended. No hepatosplenomegaly. No bruits or masses. Good bowel sounds. Extremities: no cyanosis, clubbing, rash, no edema; R 3L PICC Neuro: alert. Working with [name] & [name] all 4 extremities w/o difficulty. Affect flat   Telemetry: atypical atrial flutter in 100s  Labs: Basic Metabolic Panel:  Recent Labs Lab 02/27/14 0410 02/28/14 0248 02/28/14 0548 03/01/14 0253 03/02/14 0259 03/03/14 0400 03/04/14 0415  NA 135*  --  136* 135* 132* 137 135*  K 4.6  --  3.7 4.5 4.1 4.1 3.9  CL 97  --  97 94* 90* 95* 92*  CO2 23  --   --  26 22 26 26   GLUCOSE 215*  --  244* 178* 175* 183* 172*  BUN 39*  --  35* 34* 65* 30* 44*  CREATININE 2.21*  --  2.70* 2.25* 3.45* 2.52* 3.79*  CALCIUM 9.0  --   --  9.3 9.0 9.0 9.3  MG 2.5 2.1  --  2.1 2.0 2.0 2.1  PHOS 3.8  --   --  4.5 6.6* 4.5 5.8*    Liver Function Tests:  Recent Labs Lab 02/26/14 0410  02/27/14 0410 03/01/14 0253 03/02/14 0259 03/03/14 0400 03/04/14 0415  AST 29  --   --   --  23  --   --   ALT  17  --   --   --  14  --   --   ALKPHOS 89  --   --   --  87  --   --   BILITOT 1.2  --   --   --  0.6  --   --   PROT 8.5*  --   --   --  7.4  --   --   ALBUMIN 3.5  3.4*  < > 3.1* 3.0* 2.8* 2.9* 2.7*  < > = values in this interval not displayed. No results found for this basename: LIPASE, AMYLASE,  in the last 168 hours No results found for this basename: AMMONIA,  in the last 168 hours  CBC:  Recent Labs Lab 02/28/14 0248 02/28/14 0548 03/01/14 0253 03/02/14 0259 03/03/14 0400 03/04/14 0415  WBC 12.3*  --  11.6* 12.2* 10.7* 11.6*  HGB 8.7* 10.2* 8.6* 8.4* 8.6* 8.2*  HCT 27.9* 30.0* 27.7* 26.6* 27.4* 25.8*  MCV 95.2  --  94.9 93.0 94.2 93.5  PLT 220  --  209 233 191 214    Cardiac Enzymes: No results found for this basename: CKTOTAL, CKMB, CKMBINDEX, TROPONINI,  in the last 168 hours  BNP: BNP (last 3 results) No results found for this basename: PROBNP,  in the last 8760 hours   Other results:  EKG: AF  Imaging: Ir Fluoro Guide Cv Line Right  03/03/2014   CLINICAL DATA:  Renal failure  EXAM: TUNNELED DIALYSIS CATHETER PLACEMENT, ULTRASOUND GUIDANCE FOR VASCULAR ACCESS  FLUOROSCOPY TIME:  12 seconds.  MEDICATIONS AND MEDICAL HISTORY: None  As antibiotic prophylaxis, Ancef was ordered pre-procedure and administered intravenously within one hour of incision.  ANESTHESIA/SEDATION: None  CONTRAST:  None  PROCEDURE: The procedure, risks, benefits, and alternatives were explained to the patient. Questions regarding the procedure were encouraged and answered. The patient understands and consents to the procedure.  The right chest was prepped with dated in a sterile fashion, and a sterile drape was applied covering the operative field. A sterile gown and sterile gloves were used for the procedure. 1% lidocaine into the skin and subcutaneous tissue. The right internal jugular vein was noted to be patent initially with ultrasound. Under sonographic guidance, a micropuncture needle was  inserted into the right IJ vein (Ultrasound and fluoroscopic image documentation was performed). It was removed over an 018 wire which was upsized to an Amplatz. This was advanced into the IVC.  A small incision was made in the right upper chest. The tunneling device was utilized to advance the 23 centimeter tip to cuff catheter from the chest incision and out the neck incision. A  peel-away sheath was advanced over the Amplatz wire. The leading edge of the catheter was then advanced through the peel-away sheath. The peel-away sheath was removed. It was flushed and instilled with heparin. The chest incision was closed with a 0 Prolene pursestring stitch. The neck incision was closed with a 4-0 Vicryl subcuticular stitch. No complications.  FINDINGS: The image demonstrates placement of a tunneled dialysis catheter with its tip in the right atrium.  IMPRESSION: Successful right IJ vein tunneled dialysis catheter with its tip in the right atrium.   Electronically Signed   By: Maryclare Bean M.D.   On: 03/03/2014 17:13   Ir US Guide Vasc Access Right  03/03/2014   CLINICAL DATA:  Renal failure  EXAM: TUNNELED DIALYSIS CATHETER PLACEMENT, ULTRASOUND GUIDANCE FOR VASCULAR ACCESS  FLUOROSCOPY TIME:  12 seconds.  MEDICATIONS AND MEDICAL HISTORY: None  As antibiotic prophylaxis, Ancef was ordered pre-procedure and administered intravenously within one hour of incision.  ANESTHESIA/SEDATION: None  CONTRAST:  None  PROCEDURE: The procedure, risks, benefits, and alternatives were explained to the patient. Questions regarding the procedure were encouraged and answered. The patient understands and consents to the procedure.  The right chest was prepped with dated in a sterile fashion, and a sterile drape was applied covering the operative field. A sterile gown and sterile gloves were used for the procedure. 1% lidocaine into the skin and subcutaneous tissue. The right internal jugular vein was noted to be patent initially with  ultrasound. Under sonographic guidance, a micropuncture needle was inserted into the right IJ vein (Ultrasound and fluoroscopic image documentation was performed). It was removed over an 018 wire which was upsized to an Amplatz. This was advanced into the IVC.  A small incision was made in the right upper chest. The tunneling device was utilized to advance the 23 centimeter tip to cuff catheter from the chest incision and out the neck incision. A peel-away sheath was advanced over the Amplatz wire. The leading edge of the catheter was then advanced through the peel-away sheath. The peel-away sheath was removed. It was flushed and instilled with heparin. The chest incision was closed with a 0 Prolene pursestring stitch. The neck incision was closed with a 4-0 Vicryl subcuticular stitch. No complications.  FINDINGS: The image demonstrates placement of a tunneled dialysis catheter with its tip in the right atrium.  IMPRESSION: Successful right IJ vein tunneled dialysis catheter with its tip in the right atrium.   Electronically Signed   By: Maryclare Bean M.D.   On: 03/03/2014 17:13   Dg Chest Port 1 View  03/04/2014   CLINICAL DATA:  Pulmonary edema.  EXAM: PORTABLE CHEST - 1 VIEW  COMPARISON:  03/03/2014.  FINDINGS: Bilateral dual-lumen IJ catheters in stable position. NG tube in good anatomic position. Prior CABG and cardiac valve replacement. Cardiomegaly. Interim improvement of pulmonary venous congestion and bilateral pulmonary edema. Left-sided pleural effusion. No pneumothorax.  IMPRESSION: 1. Lines and tubes in good anatomic position. 2. Interim improvement of congestive heart failure and pulmonary edema. Persistent left pleural effusion. 3. Prior CABG cardiac valve replacement.   Electronically Signed   By: Maisie Fus  Register   On: 03/04/2014 07:47   Dg Chest Port 1 View  03/03/2014   CLINICAL DATA:  CABG, followup  EXAM: PORTABLE CHEST - 1 VIEW  COMPARISON:  Portable chest x-ray of 03/02/2014  FINDINGS: The  edema pattern has improved with only mild congestion remaining. Cardiomegaly is stable. Central venous line is unchanged.  IMPRESSION: Improved aeration  with improvement in edema pattern.   Electronically Signed   By: Dwyane Dee M.D.   On: 03/03/2014 08:06     Medications:     Scheduled Medications: . aspirin  324 mg Per Tube Daily  . bisacodyl  10 mg Oral Daily   Or  . bisacodyl  10 mg Rectal Daily  . chlorhexidine  15 mL Mouth Rinse BID  . darbepoetin (ARANESP) injection - DIALYSIS  60 mcg Intravenous Q Mon-HD  . feeding supplement (PRO-STAT SUGAR FREE 64)  30 mL Per Tube QID  . Gerhardt's butt cream   Topical TID  . insulin aspart  0-24 Units Subcutaneous 6 times per day  . insulin glargine  30 Units Subcutaneous BID  . levothyroxine  75 mcg Per Tube QAC breakfast  . midodrine  10 mg Oral TID WC  . pantoprazole sodium  40 mg Per Tube Q1200  . potassium & sodium phosphates  1 packet Per Tube TID  . sodium chloride  10-40 mL Intracatheter Q12H  . warfarin  5 mg Oral q1800  . Warfarin - Physician Dosing Inpatient   Does not apply q1800    Infusions: . sodium chloride Stopped (02/20/14 0700)  . sodium chloride Stopped (02/20/14 1830)  . amiodarone 30 mg/hr (03/04/14 1353)  . feeding supplement (NEPRO CARB STEADY) 1,000 mL (03/04/14 0400)  . heparin 1,850 Units/hr (03/04/14 1100)  . milrinone 0.25 mcg/kg/min (03/04/14 0400)  . norepinephrine (LEVOPHED) Adult infusion 3 mcg/min (03/04/14 1700)    PRN Medications: sodium chloride, sodium chloride, sodium chloride, acetaminophen, feeding supplement (NEPRO CARB STEADY), heparin, heparin, heparin, heparin, levalbuterol, lidocaine (PF), lidocaine-prilocaine, ondansetron (ZOFRAN) IV, oxyCODONE, pentafluoroprop-tetrafluoroeth, sodium chloride, traMADol   Assessment:   1) CAD - s/p CABG x 4 with MV replacement 2) A/C respiratory failure 3) AKI - On CVVHD 4) Cardiogenic shock 5) Afib/atypical A Flutter 6) L Pleural effusion -  s/p L CT 7) Anemia 8) Delerium, acute 9) ? 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 Agree with weaning levophed as tolerated. Can switch amio to po when taking pos.   No new recs at this time.    Arvilla Meres, MD  03/04/2014

## 2014-03-04 NOTE — Progress Notes (Signed)
Patient ID: [name], female   DOB: 1941-03-01, 73 y.o.   MRN: [id number] EVENING ROUNDS NOTE :     [address]       [place] [address]             [phone]                 1 Day Post-Op Procedure(s) (LRB): CARDIOVERSION (N/A)  Total Length of Stay:  LOS: 38 days  BP 109/61  Pulse 94  Temp(Src) 97.5 F (36.4 C) (Oral)  Resp 26  Ht 5\' 7"  (1.702 m)  Wt 194 lb 0.1 oz (88 kg)  BMI 30.38 kg/m2  SpO2 97%  .Intake/Output     08/18 0701 - 08/19 0700 08/19 0701 - 08/20 0700   I.V. (mL/kg) 1153.6 (13.2) 271.6 (3.1)   NG/GT 943.3 550   IV Piggyback 50    Total Intake(mL/kg) 2146.9 (24.6) 821.6 (9.3)   Other  3001   Stool 75    Total Output 75 3001   Net +2071.9 -2179.4          . sodium chloride Stopped (02/20/14 0700)  . sodium chloride Stopped (02/20/14 1830)  . amiodarone 30 mg/hr (03/04/14 1353)  . feeding supplement (NEPRO CARB STEADY) 1,000 mL (03/04/14 0400)  . heparin 1,850 Units/hr (03/04/14 1100)  . milrinone 0.25 mcg/kg/min (03/04/14 0400)  . norepinephrine (LEVOPHED) Adult infusion 3 mcg/min (03/04/14 1700)     Lab Results  Component Value Date   WBC 11.6* 03/04/2014   HGB 8.2* 03/04/2014   HCT 25.8* 03/04/2014   PLT 214 03/04/2014   GLUCOSE 172* 03/04/2014   CHOL 55 02/06/2014   TRIG 115 01/26/2014   HDL 20* 01/26/2014   LDLCALC 39 01/26/2014   ALT 14 03/02/2014   AST 23 03/02/2014   NA 135* 03/04/2014   K 3.9 03/04/2014   CL 92* 03/04/2014   CREATININE 3.79* 03/04/2014   BUN 44* 03/04/2014   CO2 26 03/04/2014   TSH 6.630* 02/11/2014   INR 1.39 03/04/2014   HGBA1C 8.4* 01/26/2014   Stable day, up to chair Still on milione and levaphed   Delight Ovens MD  Beeper [phone] Office [phone] 03/04/2014 5:40 PM

## 2014-03-04 NOTE — Progress Notes (Signed)
ANTICOAGULATION CONSULT NOTE - Follow Up Consult  Pharmacy Consult for Heparin  Indication: atrial fibrillation  No Known Allergies  Patient Measurements: Height: 5\' 7"  (170.2 cm) Weight: 194 lb 0.1 oz (88 kg) IBW/kg (Calculated) : 61.6  Vital Signs: Temp: 97.5 F (36.4 C) (08/19 1644) Temp src: Oral (08/19 1644) BP: 88/37 mmHg (08/19 1900) Pulse Rate: 96 (08/19 1900)  Labs:  Recent Labs  03/02/14 0259 03/03/14 0400 03/04/14 0415 03/04/14 1836  HGB 8.4* 8.6* 8.2*  --   HCT 26.6* 27.4* 25.8*  --   PLT 233 191 214  --   LABPROT 15.4* 16.3* 17.1*  --   INR 1.22 1.31 1.39  --   HEPARINUNFRC 0.54 0.69 0.20* 0.15*  CREATININE 3.45* 2.52* 3.79*  --     Estimated Creatinine Clearance: 14.8 ml/min (by C-G formula based on Cr of 3.79).   Assessment: 73 y/o F transitioning to [facility] for acute renal failure post CABG.  She is on a heparin drip for prosthetic MVR and afib at 1850 units/hr, Heparin gtt was stopped this morning for 3 hrs, and resumed at ~1100, level (0.15) is below goal around 7.5 hrs after restart.   Goal of Therapy:  Heparin level: 0.3-0.5 per Dr. Donata Clay Monitor platelets by anticoagulation protocol: Yes   Plan:  - Increase heparin to 1950 units/hr - f/u Am heparin lvel - Daily heparin level and CBC - Monitor platelets closely and s/s bleeding -Continue Coumadin dosing per MD  Bayard Hugger, PharmD, BCPS  Clinical Pharmacist  Pager: [phone]  03/04/2014 7:25 PM

## 2014-03-04 NOTE — Progress Notes (Signed)
1 Day Post-Op Procedure(s) (LRB): CARDIOVERSION (N/A) Subjective: Maintaining NSR post DCCV INR slow to increase- cont heparin- coumadin 5 mg this pm Temp HD catheter removed EPW's removed Plan for HD 3x weekly Objective: Vital signs in last 24 hours: Temp:  [97 F (36.1 C)-98.5 F (36.9 C)] 97.2 F (36.2 C) (08/19 0805) Pulse Rate:  [90-104] 90 (08/19 0800) Cardiac Rhythm:  [-] Atrial fibrillation (08/19 0800) Resp:  [16-30] 27 (08/19 0800) BP: (84-145)/(41-72) 92/47 mmHg (08/19 0800) SpO2:  [91 %-100 %] 97 % (08/19 0800) Weight:  [192 lb 7.4 oz (87.3 kg)] 192 lb 7.4 oz (87.3 kg) (08/19 0500)  Hemodynamic parameters for last 24 hours: CVP:  [3 mmHg-15 mmHg] 9 mmHg  Intake/Output from previous day: 08/18 0701 - 08/19 0700 In: 2146.9 [I.V.:1153.6; NG/GT:943.3; IV Piggyback:50] Out: 75 [Stool:75] Intake/Output this shift: Total I/O In: 67.2 [I.V.:27.2; NG/GT:40] Out: -   EXAM Flat affect extrem warm w/o edema Incisions clean  Lab Results:  Recent Labs  03/03/14 0400 03/04/14 0415  WBC 10.7* 11.6*  HGB 8.6* 8.2*  HCT 27.4* 25.8*  PLT 191 214   BMET:  Recent Labs  03/03/14 0400 03/04/14 0415  NA 137 135*  K 4.1 3.9  CL 95* 92*  CO2 26 26  GLUCOSE 183* 172*  BUN 30* 44*  CREATININE 2.52* 3.79*  CALCIUM 9.0 9.3    PT/INR:  Recent Labs  03/04/14 0415  LABPROT 17.1*  INR 1.39   ABG    Component Value Date/Time   PHART 7.463* 02/17/2014 1013   HCO3 26.6* 02/17/2014 1013   TCO2 23 02/28/2014 0548   ACIDBASEDEF 0.1 01/30/2014 0339   O2SAT 53.0 03/04/2014 0422   CBG (last 3)   Recent Labs  03/03/14 2339 03/04/14 0356 03/04/14 0802  GLUCAP 189* 175* 165*    Assessment/Plan: S/P Procedure(s) (LRB): CARDIOVERSION (N/A) Cont coumadin load LTAC eval requested from case manager still pending   LOS: 38 days    VAN TRIGT III,PETER 03/04/2014

## 2014-03-04 NOTE — Progress Notes (Signed)
ANTICOAGULATION CONSULT NOTE - Follow Up Consult  Pharmacy Consult for Heparin  Indication: atrial fibrillation  No Known Allergies  Patient Measurements: Height: 5\' 7"  (170.2 cm) Weight: 192 lb 7.4 oz (87.3 kg) IBW/kg (Calculated) : 61.6  Vital Signs: Temp: 98.2 F (36.8 C) (08/19 0400) Temp src: Oral (08/19 0400) BP: 96/47 mmHg (08/19 0500) Pulse Rate: 94 (08/19 0500)  Labs:  Recent Labs  03/02/14 0259 03/03/14 0400 03/04/14 0415  HGB 8.4* 8.6* 8.2*  HCT 26.6* 27.4* 25.8*  PLT 233 191 214  LABPROT 15.4* 16.3* 17.1*  INR 1.22 1.31 1.39  HEPARINUNFRC 0.54 0.69 0.20*  CREATININE 3.45* 2.52*  --     Estimated Creatinine Clearance: 22.2 ml/min (by C-G formula based on Cr of 2.52).   Assessment: 73 y/o F transitioning to [facility] for acute renal failure post CABG.  She is on a heparin drip for prosthetic MVR and afib at 1750 units/hr, level this am is below goal. H/H is low but stable, plt wnl and stable.  No bleeding noted. HIT negative on 7/15. INR trending up to 1.39.  Goal of Therapy:  Heparin level: 0.3-0.5 per Dr. Donata Clay Monitor platelets by anticoagulation protocol: Yes   Plan:  - Increase heparin to 1850 units/hr - Daily heparin level and CBC - Monitor platelets closely and s/s bleeding -Continue Coumadin dosing per MD  Sheppard Coil PharmD., BCPS Clinical Pharmacist Pager [phone] 03/04/2014 5:17 AM

## 2014-03-04 NOTE — Progress Notes (Signed)
Physical Therapy Treatment Patient Details Name: [name] MRN: [id number] DOB: 12-02-1940 Today's Date: 03/04/2014    History of Present Illness Pt adm with MI and underwent emergent CABG x 4 and MVR on 7/13. Post op course complicated by VDRF and bil pleural effusions and requiring CVVHD. Extubated 7/31. Transitioned from CVVHD to HD on 8/14.  PMH - DM, MI, HTN,    PT Comments    Pt admitted with above. Pt currently with functional limitations due to balance and endurance deficits although much improved sitting balance from yesterday.  Pt will benefit from skilled PT to increase their independence and safety with mobility to allow discharge to the venue listed below.   Follow Up Recommendations  LTACH     Equipment Recommendations  Other (comment) (TBA)    Recommendations for Other Services       Precautions / Restrictions Precautions Precautions: Fall Precaution Comments: multiple lines/tubes Restrictions Weight Bearing Restrictions: No    Mobility  Bed Mobility Overal bed mobility: Needs Assistance Bed Mobility: Rolling;Supine to Sit Rolling: Mod assist;+2 for physical assistance   Supine to sit: Max assist;+2 for physical assistance;HOB elevated     General bed mobility comments: Pt brought LEs to EOB and needed some assist for elevation of trunk.    Transfers Overall transfer level: Needs assistance Equipment used: 2 person hand held assist Transfers: Sit to/from [employer] Sit to Stand: Mod assist;Max assist;+2 physical assistance   Squat pivot transfers: Max assist;+2 physical assistance     General transfer comment: Pt performed sit to stand best on first attempt.  Pt was able to weight bear on feet and achieve partial stand for 10 seconds.  On second attempt determined to pivot therefore used pad and assisted pt to recliner.  Pt fatigues quickly therefore needed max assist to complete transfer.    Ambulation/Gait                  Stairs            Wheelchair Mobility    Modified Rankin (Stroke Patients Only)       Balance Overall balance assessment: Needs assistance Sitting-balance support: Bilateral upper extremity supported;Feet supported Sitting balance-Leahy Scale: Fair Sitting balance - Comments: Pt was able to sit without assist just supervision for 5 minutes at EOB.  Was not supporting self with UES but had hands placed on bed.  Much improved balance at EOB today.  Does fatigue quickly therefore 5 min was max amount pt could tolerate today.   Postural control: Posterior lean Standing balance support: Bilateral upper extremity supported;During functional activity Standing balance-Leahy Scale: Zero Standing balance comment: Needs max assist and caannot achieve upright stance.                     Cognition Arousal/Alertness: Awake/alert Behavior During Therapy: Flat affect Overall Cognitive Status: Impaired/Different from baseline Area of Impairment: Problem solving       Following Commands: Follows one step commands consistently;Follows one step commands with increased time     Problem Solving: Requires verbal cues;Requires tactile cues General Comments: Will only answer with yes/no answers.    Exercises General Exercises - Lower Extremity Ankle Circles/Pumps: AAROM;Both;10 reps;Seated Long Arc Quad: AAROM;Both;10 reps;Seated Heel Slides: AAROM;Both;5 reps;Supine    General Comments        Pertinent Vitals/Pain Pain Assessment: Faces Faces Pain Scale: Hurts little more Pain Location: chest Pain Intervention(s): Monitored during session;Repositioned VSS    Home Living  Prior Function            PT Goals (current goals can now be found in the care plan section) Progress towards PT goals: Progressing toward goals    Frequency  Min 2X/week    PT Plan Current plan remains appropriate    Co-evaluation             End of  Session Equipment Utilized During Treatment: Gait belt Activity Tolerance: Patient limited by fatigue Patient left: in chair;with call bell/phone within reach;with restraints reapplied     Time: [id number] PT Time Calculation (min): 24 min  Charges:  $Therapeutic Activity: 23-37 mins                    G Codes:      [name],[name] 03/04/2014, 11:51 AM Audree Camelawn [name],PT Acute Rehabilitation [phone] (pager)

## 2014-03-05 ENCOUNTER — Inpatient Hospital Stay (HOSPITAL_COMMUNITY): Payer: Medicare Other

## 2014-03-05 LAB — PROTIME-INR
INR: 1.64 — ABNORMAL HIGH (ref 0.00–1.49)
Prothrombin Time: 19.4 seconds — ABNORMAL HIGH (ref 11.6–15.2)

## 2014-03-05 LAB — GLUCOSE, CAPILLARY
Glucose-Capillary: 141 mg/dL — ABNORMAL HIGH (ref 70–99)
Glucose-Capillary: 155 mg/dL — ABNORMAL HIGH (ref 70–99)
Glucose-Capillary: 169 mg/dL — ABNORMAL HIGH (ref 70–99)
Glucose-Capillary: 171 mg/dL — ABNORMAL HIGH (ref 70–99)
Glucose-Capillary: 182 mg/dL — ABNORMAL HIGH (ref 70–99)
Glucose-Capillary: 189 mg/dL — ABNORMAL HIGH (ref 70–99)
Glucose-Capillary: 193 mg/dL — ABNORMAL HIGH (ref 70–99)

## 2014-03-05 LAB — CARBOXYHEMOGLOBIN
Carboxyhemoglobin: 2 % — ABNORMAL HIGH (ref 0.5–1.5)
Methemoglobin: 0.8 % (ref 0.0–1.5)
O2 Saturation: 61.1 %
Total hemoglobin: 8.9 g/dL — ABNORMAL LOW (ref 12.0–16.0)

## 2014-03-05 LAB — CBC
HCT: 27.2 % — ABNORMAL LOW (ref 36.0–46.0)
Hemoglobin: 8.5 g/dL — ABNORMAL LOW (ref 12.0–15.0)
MCH: 29.6 pg (ref 26.0–34.0)
MCHC: 31.3 g/dL (ref 30.0–36.0)
MCV: 94.8 fL (ref 78.0–100.0)
Platelets: 217 10*3/uL (ref 150–400)
RBC: 2.87 MIL/uL — ABNORMAL LOW (ref 3.87–5.11)
RDW: 18 % — ABNORMAL HIGH (ref 11.5–15.5)
WBC: 12.3 10*3/uL — ABNORMAL HIGH (ref 4.0–10.5)

## 2014-03-05 LAB — HEPARIN LEVEL (UNFRACTIONATED): Heparin Unfractionated: 0.32 IU/mL (ref 0.30–0.70)

## 2014-03-05 LAB — MAGNESIUM: Magnesium: 2.1 mg/dL (ref 1.5–2.5)

## 2014-03-05 MED ORDER — ALBUMIN HUMAN 5 % IV SOLN
12.5000 g | Freq: Once | INTRAVENOUS | Status: AC
  Administered 2014-03-05: 12.5 g via INTRAVENOUS
  Filled 2014-03-05: qty 250

## 2014-03-05 NOTE — Care Management Note (Signed)
    Page 1 of 2   03/05/2014     3:17:15 PM CARE MANAGEMENT NOTE 03/05/2014  Patient:  [name], [name]   Account Number:  [id number]  Date Initiated:  01/28/2014  Documentation initiated by:  Mantaj Chamberlin  Subjective/Objective Assessment:   S/P CABG x 4, MVR; lives with dtr    PCP  Mickle Plumb     DC Planning Services  CM consult      Medicare Important Message given?  YES (If response is "NO", the following Medicare IM given date fields will be blank) Date Medicare IM given:  03/05/2014 Medicare IM given by:  Brieanna Nau Date Additional Medicare IM given:  03/02/2014 Additional Medicare IM given by:  Verdis Prime  Per UR Regulation:  Reviewed for med. necessity/level of care/duration of stay  If discussed at Long Length of Stay Meetings, dates discussed:   01/29/2014  02/03/2014  02/05/2014  02/10/2014  02/12/2014  02/17/2014  02/19/2014  02/24/2014  02/26/2014  03/03/2014  03/05/2014    Comments:  03/05/14 1509 Dorcas Melito RN MSN BSN CCM Per attending and after LTAC evals, pt now appropriate for transfer.  Discussed specialty hospitals with daughter who prefers [facility].  Per liaison, they will be ready to accept pt for admission following HD tomorrow.  03/02/14 1317 Ryzen Deady RN MSN BSN CCM CRRT d/c'd, dialyzed 8/14, 8/15, and 8/17.  Await 3 x wk schedule and PC placement.  02/17/14 1418 Emilo Gras RN MSN BSN CCM Per liaison, pt will be appropriate for LTAC when on intermittent HD.  02/16/14 0351 Verdis Prime RN MSN BSN CCM Remains on levophed/milrinone gtts, CRRT.  02/10/14 1128 Verdis Prime RN MSN BSN CCM Amio/milrinone gtts; extubated-then re-intubated.  02/05/14 1452 Azizah Lisle RN MSN BSN CCM Remains on vent, milrinone/dopamine/lasix gtts.  01/28/14 1115 Medhansh Brinkmeier RN MSN BSN CCM Remains on vent, levophed/lasix gtts, IABP.

## 2014-03-05 NOTE — Progress Notes (Signed)
CT surgery p.m. Rounds Patient resting comfortably, breathing easily on room air Did not receive hemodialysis today Blood pressure 90/70 off norepinephrine CVP low-1 unit albumin ordered

## 2014-03-05 NOTE — Progress Notes (Signed)
Patient ID: [name], female   DOB: 1940-10-31, 73 y.o.   MRN: [id number] Advanced Heart Failure Rounding Note   Subjective:    [name] is a 73 yo female with a history of CAD s/p PCI at [facility], HTN, TIA, DM2 and PAD. She presented to [facility] 01/25/14 with CP,Vtach and ST changes in inferior leads and was taken to the cath lab. She had severe multivessel CAD involving 100% distal RCA with tandem 90% & 80% lesions, proximal D1 100%, proximal LAD & AVGroove Circ 90% with 80% OM2 and was evaluated by Dr. Maren Beach for CABG and IABP was placed.  Underwent emergent CABGx4 and MV replacement (LIMA-LAD, SVG to OM1, SVG to OM2 and SVG to PDA) on 01/26/14. She was transferred to the ICU on multiple pressors and IABP. UOP was sluggish and remained volume overloaded and lasix gtt was started. IABP removed 7/17. TF started for nutrition. Had difficulty with thrombocytopenia which improved and HIT studies negative and Coumadin was restarted. Underwent thoracentesis on R on 02/06/14. Extubated 7/25 but re-intubated 7/27 electively for therapeutic bronchoscopy showing no significant mucus, airways clear w/ exception of what appeared to be suction trauma at the inlet of the RUL. Underwent L CT by IR on 7/27. Her Creatinine was stable for awhile and then started trending up even on milrinone. Underwent HD cath placement 7/28 and started on CVVHD on 7/29. Extubated again 02/13/14. Has had issues with Afib/Aflutter.   ECHO 02/17/14. Reviewed personally EF 35-40% Inferior wall out. RV moderately HK with TAPSE 1.18cm  Off levophed. Co-ox stable. Remains confused and anuric.     Objective:   Weight Range:  Vital Signs:   Temp:  [97.3 F (36.3 C)-98.3 F (36.8 C)] 97.4 F (36.3 C) (08/20 0800) Pulse Rate:  [90-102] 92 (08/20 0700) Resp:  [14-31] 24 (08/20 0700) BP: (88-126)/(37-73) 102/51 mmHg (08/20 0700) SpO2:  [93 %-100 %] 99 % (08/20 0700) Last BM Date: 03/04/14  Weight change: Filed Weights   03/03/14 0500  03/04/14 0500 03/04/14 1100  Weight: 86.6 kg (190 lb 14.7 oz) 87.3 kg (192 lb 7.4 oz) 88 kg (194 lb 0.1 oz)    Intake/Output:   Intake/Output Summary (Last 24 hours) at 03/05/14 1107 Last data filed at 03/05/14 0900  Gross per 24 hour  Intake 2002.8 ml  Output   3001 ml  Net -998.2 ml     Physical Exam: CVP 8  General:  Chronically ill appearing. No resp difficulty, confused HEENT: normal; Panda intact; L IJ HD cath Neck: supple. JVP 8-9 Carotids 2+ bilat; no bruits. No lymphadenopathy or thryomegaly appreciated. Cor: PMI nondisplaced. Regular rate & irregular rhythm. No rubs, gallops or murmurs. Lungs: Diminished in the bases Abdomen: soft, nontender, nondistended. No hepatosplenomegaly. No bruits or masses. Good bowel sounds. Extremities: no cyanosis, clubbing, rash, no edema; R 3L PICC Neuro: alert. Working with [employer] all 4 extremities w/o difficulty. Affect flat   Telemetry: atypical atrial flutter in 100s  Labs: Basic Metabolic Panel:  Recent Labs Lab 02/27/14 0410  02/28/14 0548 03/01/14 0253 03/02/14 0259 03/03/14 0400 03/04/14 0415 03/05/14 0420  NA 135*  --  136* 135* 132* 137 135*  --   K 4.6  --  3.7 4.5 4.1 4.1 3.9  --   CL 97  --  97 94* 90* 95* 92*  --   CO2 23  --   --  26 22 26 26   --   GLUCOSE 215*  --  244* 178* 175* 183*  172*  --   BUN 39*  --  35* 34* 65* 30* 44*  --   CREATININE 2.21*  --  2.70* 2.25* 3.45* 2.52* 3.79*  --   CALCIUM 9.0  --   --  9.3 9.0 9.0 9.3  --   MG 2.5  < >  --  2.1 2.0 2.0 2.1 2.1  PHOS 3.8  --   --  4.5 6.6* 4.5 5.8*  --   < > = values in this interval not displayed.  Liver Function Tests:  Recent Labs Lab 02/27/14 0410 03/01/14 0253 03/02/14 0259 03/03/14 0400 03/04/14 0415  AST  --   --  23  --   --   ALT  --   --  14  --   --   ALKPHOS  --   --  87  --   --   BILITOT  --   --  0.6  --   --   PROT  --   --  7.4  --   --   ALBUMIN 3.1* 3.0* 2.8* 2.9* 2.7*   No results found for this basename: LIPASE,  AMYLASE,  in the last 168 hours No results found for this basename: AMMONIA,  in the last 168 hours  CBC:  Recent Labs Lab 03/01/14 0253 03/02/14 0259 03/03/14 0400 03/04/14 0415 03/05/14 0420  WBC 11.6* 12.2* 10.7* 11.6* 12.3*  HGB 8.6* 8.4* 8.6* 8.2* 8.5*  HCT 27.7* 26.6* 27.4* 25.8* 27.2*  MCV 94.9 93.0 94.2 93.5 94.8  PLT 209 233 191 214 217    Cardiac Enzymes: No results found for this basename: CKTOTAL, CKMB, CKMBINDEX, TROPONINI,  in the last 168 hours  BNP: BNP (last 3 results) No results found for this basename: PROBNP,  in the last 8760 hours   Other results:  EKG: AF  Imaging: Ir Fluoro Guide Cv Line Right  03/03/2014   CLINICAL DATA:  Renal failure  EXAM: TUNNELED DIALYSIS CATHETER PLACEMENT, ULTRASOUND GUIDANCE FOR VASCULAR ACCESS  FLUOROSCOPY TIME:  12 seconds.  MEDICATIONS AND MEDICAL HISTORY: None  As antibiotic prophylaxis, Ancef was ordered pre-procedure and administered intravenously within one hour of incision.  ANESTHESIA/SEDATION: None  CONTRAST:  None  PROCEDURE: The procedure, risks, benefits, and alternatives were explained to the patient. Questions regarding the procedure were encouraged and answered. The patient understands and consents to the procedure.  The right chest was prepped with dated in a sterile fashion, and a sterile drape was applied covering the operative field. A sterile gown and sterile gloves were used for the procedure. 1% lidocaine into the skin and subcutaneous tissue. The right internal jugular vein was noted to be patent initially with ultrasound. Under sonographic guidance, a micropuncture needle was inserted into the right IJ vein (Ultrasound and fluoroscopic image documentation was performed). It was removed over an 018 wire which was upsized to an Amplatz. This was advanced into the IVC.  A small incision was made in the right upper chest. The tunneling device was utilized to advance the 23 centimeter tip to cuff catheter from the  chest incision and out the neck incision. A peel-away sheath was advanced over the Amplatz wire. The leading edge of the catheter was then advanced through the peel-away sheath. The peel-away sheath was removed. It was flushed and instilled with heparin. The chest incision was closed with a 0 Prolene pursestring stitch. The neck incision was closed with a 4-0 Vicryl subcuticular stitch. No complications.  FINDINGS: The image demonstrates placement  of a tunneled dialysis catheter with its tip in the right atrium.  IMPRESSION: Successful right IJ vein tunneled dialysis catheter with its tip in the right atrium.   Electronically Signed   By: Maryclare Bean M.D.   On: 03/03/2014 17:13   Ir US Guide Vasc Access Right  03/03/2014   CLINICAL DATA:  Renal failure  EXAM: TUNNELED DIALYSIS CATHETER PLACEMENT, ULTRASOUND GUIDANCE FOR VASCULAR ACCESS  FLUOROSCOPY TIME:  12 seconds.  MEDICATIONS AND MEDICAL HISTORY: None  As antibiotic prophylaxis, Ancef was ordered pre-procedure and administered intravenously within one hour of incision.  ANESTHESIA/SEDATION: None  CONTRAST:  None  PROCEDURE: The procedure, risks, benefits, and alternatives were explained to the patient. Questions regarding the procedure were encouraged and answered. The patient understands and consents to the procedure.  The right chest was prepped with dated in a sterile fashion, and a sterile drape was applied covering the operative field. A sterile gown and sterile gloves were used for the procedure. 1% lidocaine into the skin and subcutaneous tissue. The right internal jugular vein was noted to be patent initially with ultrasound. Under sonographic guidance, a micropuncture needle was inserted into the right IJ vein (Ultrasound and fluoroscopic image documentation was performed). It was removed over an 018 wire which was upsized to an Amplatz. This was advanced into the IVC.  A small incision was made in the right upper chest. The tunneling device was  utilized to advance the 23 centimeter tip to cuff catheter from the chest incision and out the neck incision. A peel-away sheath was advanced over the Amplatz wire. The leading edge of the catheter was then advanced through the peel-away sheath. The peel-away sheath was removed. It was flushed and instilled with heparin. The chest incision was closed with a 0 Prolene pursestring stitch. The neck incision was closed with a 4-0 Vicryl subcuticular stitch. No complications.  FINDINGS: The image demonstrates placement of a tunneled dialysis catheter with its tip in the right atrium.  IMPRESSION: Successful right IJ vein tunneled dialysis catheter with its tip in the right atrium.   Electronically Signed   By: Maryclare Bean M.D.   On: 03/03/2014 17:13   Dg Chest Port 1 View  03/05/2014   CLINICAL DATA:  Coronary bypass  EXAM: PORTABLE CHEST - 1 VIEW  COMPARISON:  03/04/2014  FINDINGS: Right IJ tunnel dialysis catheter tips in the SVC RA junction as before. Feeding tube and left IJ dialysis catheters removed. Coronary bypass and prosthetic heart valve noted. Heart is enlarged with vascular congestion and mild interstitial edema pattern. Small left effusion noted. Persistent left base atelectasis/ consolidation obscuring the left hemidiaphragm. No pneumothorax.  IMPRESSION: Slight worsening interstitial edema pattern.   Electronically Signed   By: Ruel Favors M.D.   On: 03/05/2014 08:02   Dg Chest Port 1 View  03/04/2014   CLINICAL DATA:  Pulmonary edema.  EXAM: PORTABLE CHEST - 1 VIEW  COMPARISON:  03/03/2014.  FINDINGS: Bilateral dual-lumen IJ catheters in stable position. NG tube in good anatomic position. Prior CABG and cardiac valve replacement. Cardiomegaly. Interim improvement of pulmonary venous congestion and bilateral pulmonary edema. Left-sided pleural effusion. No pneumothorax.  IMPRESSION: 1. Lines and tubes in good anatomic position. 2. Interim improvement of congestive heart failure and pulmonary edema.  Persistent left pleural effusion. 3. Prior CABG cardiac valve replacement.   Electronically Signed   By: Maisie Fus  Register   On: 03/04/2014 07:47     Medications:     Scheduled Medications: .  aspirin  324 mg Per Tube Daily  . bisacodyl  10 mg Oral Daily   Or  . bisacodyl  10 mg Rectal Daily  . chlorhexidine  15 mL Mouth Rinse BID  . darbepoetin (ARANESP) injection - DIALYSIS  60 mcg Intravenous Q Mon-HD  . feeding supplement (PRO-STAT SUGAR FREE 64)  30 mL Per Tube QID  . Gerhardt's butt cream   Topical TID  . insulin aspart  0-24 Units Subcutaneous 6 times per day  . insulin glargine  30 Units Subcutaneous BID  . levothyroxine  75 mcg Per Tube QAC breakfast  . midodrine  10 mg Oral TID WC  . pantoprazole sodium  40 mg Per Tube Q1200  . potassium & sodium phosphates  1 packet Per Tube TID  . sodium chloride  10-40 mL Intracatheter Q12H  . warfarin  5 mg Oral q1800  . Warfarin - Physician Dosing Inpatient   Does not apply q1800    Infusions: . sodium chloride Stopped (02/20/14 0700)  . sodium chloride Stopped (02/20/14 1830)  . amiodarone 30 mg/hr (03/05/14 0400)  . feeding supplement (NEPRO CARB STEADY) 1,000 mL (03/05/14 0400)  . heparin 1,950 Units/hr (03/05/14 0400)  . milrinone 0.25 mcg/kg/min (03/05/14 0400)  . norepinephrine (LEVOPHED) Adult infusion 2 mcg/min (03/05/14 0600)    PRN Medications: sodium chloride, sodium chloride, sodium chloride, acetaminophen, feeding supplement (NEPRO CARB STEADY), heparin, heparin, heparin, heparin, levalbuterol, lidocaine (PF), lidocaine-prilocaine, ondansetron (ZOFRAN) IV, oxyCODONE, pentafluoroprop-tetrafluoroeth, sodium chloride, traMADol   Assessment:   1) CAD - s/p CABG x 4 with MV replacement 2) A/C respiratory failure 3) AKI - On CVVHD 4) Cardiogenic shock 5) Afib/atypical A Flutter 6) L Pleural effusion - s/p L CT 7) Anemia 8) Delerium, acute 9) ? 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 Co-ox stable for now. Remains on milrinone. In NSR after DC-CV loading coumadin. Can switch amio to po when taking pos.  No b-blocker or ACE due to shock and renal failure.   Agree with transfer to [facility] when ready. Can change amio to 200 po bid when taking pos.      Arvilla Meres, MD  03/05/2014

## 2014-03-05 NOTE — Progress Notes (Signed)
Patient ID: [name], female   DOB: September 05, 1940, 73 y.o.   MRN: [id number] S:No complaints O:BP 102/51  Pulse 92  Temp(Src) 97.4 F (36.3 C) (Oral)  Resp 24  Ht 5\' 7"  (1.702 m)  Wt 88 kg (194 lb 0.1 oz)  BMI 30.38 kg/m2  SpO2 99%  Intake/Output Summary (Last 24 hours) at 03/05/14 0810 Last data filed at 03/05/14 0700  Gross per 24 hour  Intake 2154.4 ml  Output   3001 ml  Net -846.6 ml   Intake/Output: I/O last 3 completed shifts: In: 3419 [I.V.:1529; NG/GT:1890] Out: 3001 [Other:3001]  Intake/Output this shift:    Weight change: 0.7 kg (1 lb 8.7 oz) Gen:WD elderly WF in NAD, slowed mentation CVS:no rub, RRR Resp:cta WUJ:WJXBJYAbd:benign Ext:no edema   Recent Labs Lab 02/26/14 1527 02/27/14 0410 02/28/14 0548 03/01/14 0253 03/02/14 0259 03/03/14 0400 03/04/14 0415  NA 137 135* 136* 135* 132* 137 135*  K 4.4 4.6 3.7 4.5 4.1 4.1 3.9  CL 97 97 97 94* 90* 95* 92*  CO2 25 23  --  26 22 26 26   GLUCOSE 130* 215* 244* 178* 175* 183* 172*  BUN 19 39* 35* 34* 65* 30* 44*  CREATININE 1.13* 2.21* 2.70* 2.25* 3.45* 2.52* 3.79*  ALBUMIN 3.2* 3.1*  --  3.0* 2.8* 2.9* 2.7*  CALCIUM 9.0 9.0  --  9.3 9.0 9.0 9.3  PHOS 2.3 3.8  --  4.5 6.6* 4.5 5.8*  AST  --   --   --   --  23  --   --   ALT  --   --   --   --  14  --   --    Liver Function Tests:  Recent Labs Lab 03/02/14 0259 03/03/14 0400 03/04/14 0415  AST 23  --   --   ALT 14  --   --   ALKPHOS 87  --   --   BILITOT 0.6  --   --   PROT 7.4  --   --   ALBUMIN 2.8* 2.9* 2.7*   No results found for this basename: LIPASE, AMYLASE,  in the last 168 hours No results found for this basename: AMMONIA,  in the last 168 hours CBC:  Recent Labs Lab 03/01/14 0253 03/02/14 0259 03/03/14 0400 03/04/14 0415 03/05/14 0420  WBC 11.6* 12.2* 10.7* 11.6* 12.3*  HGB 8.6* 8.4* 8.6* 8.2* 8.5*  HCT 27.7* 26.6* 27.4* 25.8* 27.2*  MCV 94.9 93.0 94.2 93.5 94.8  PLT 209 233 191 214 217   Cardiac Enzymes: No results found for this  basename: CKTOTAL, CKMB, CKMBINDEX, TROPONINI,  in the last 168 hours CBG:  Recent Labs Lab 03/04/14 1245 03/04/14 1642 03/04/14 2014 03/05/14 03/05/14 0400  GLUCAP 142* 162* 193* 162* 182*    Iron Studies: No results found for this basename: IRON, TIBC, TRANSFERRIN, FERRITIN,  in the last 72 hours Studies/Results: Ir Fluoro Guide Cv Line Right  03/03/2014   CLINICAL DATA:  Renal failure  EXAM: TUNNELED DIALYSIS CATHETER PLACEMENT, ULTRASOUND GUIDANCE FOR VASCULAR ACCESS  FLUOROSCOPY TIME:  12 seconds.  MEDICATIONS AND MEDICAL HISTORY: None  As antibiotic prophylaxis, Ancef was ordered pre-procedure and administered intravenously within one hour of incision.  ANESTHESIA/SEDATION: None  CONTRAST:  None  PROCEDURE: The procedure, risks, benefits, and alternatives were explained to the patient. Questions regarding the procedure were encouraged and answered. The patient understands and consents to the procedure.  The right chest was prepped with dated in a sterile  fashion, and a sterile drape was applied covering the operative field. A sterile gown and sterile gloves were used for the procedure. 1% lidocaine into the skin and subcutaneous tissue. The right internal jugular vein was noted to be patent initially with ultrasound. Under sonographic guidance, a micropuncture needle was inserted into the right IJ vein (Ultrasound and fluoroscopic image documentation was performed). It was removed over an 018 wire which was upsized to an Amplatz. This was advanced into the IVC.  A small incision was made in the right upper chest. The tunneling device was utilized to advance the 23 centimeter tip to cuff catheter from the chest incision and out the neck incision. A peel-away sheath was advanced over the Amplatz wire. The leading edge of the catheter was then advanced through the peel-away sheath. The peel-away sheath was removed. It was flushed and instilled with heparin. The chest incision was closed with a 0  Prolene pursestring stitch. The neck incision was closed with a 4-0 Vicryl subcuticular stitch. No complications.  FINDINGS: The image demonstrates placement of a tunneled dialysis catheter with its tip in the right atrium.  IMPRESSION: Successful right IJ vein tunneled dialysis catheter with its tip in the right atrium.   Electronically Signed   By: Maryclare Bean M.D.   On: 03/03/2014 17:13   Ir US Guide Vasc Access Right  03/03/2014   CLINICAL DATA:  Renal failure  EXAM: TUNNELED DIALYSIS CATHETER PLACEMENT, ULTRASOUND GUIDANCE FOR VASCULAR ACCESS  FLUOROSCOPY TIME:  12 seconds.  MEDICATIONS AND MEDICAL HISTORY: None  As antibiotic prophylaxis, Ancef was ordered pre-procedure and administered intravenously within one hour of incision.  ANESTHESIA/SEDATION: None  CONTRAST:  None  PROCEDURE: The procedure, risks, benefits, and alternatives were explained to the patient. Questions regarding the procedure were encouraged and answered. The patient understands and consents to the procedure.  The right chest was prepped with dated in a sterile fashion, and a sterile drape was applied covering the operative field. A sterile gown and sterile gloves were used for the procedure. 1% lidocaine into the skin and subcutaneous tissue. The right internal jugular vein was noted to be patent initially with ultrasound. Under sonographic guidance, a micropuncture needle was inserted into the right IJ vein (Ultrasound and fluoroscopic image documentation was performed). It was removed over an 018 wire which was upsized to an Amplatz. This was advanced into the IVC.  A small incision was made in the right upper chest. The tunneling device was utilized to advance the 23 centimeter tip to cuff catheter from the chest incision and out the neck incision. A peel-away sheath was advanced over the Amplatz wire. The leading edge of the catheter was then advanced through the peel-away sheath. The peel-away sheath was removed. It was flushed and  instilled with heparin. The chest incision was closed with a 0 Prolene pursestring stitch. The neck incision was closed with a 4-0 Vicryl subcuticular stitch. No complications.  FINDINGS: The image demonstrates placement of a tunneled dialysis catheter with its tip in the right atrium.  IMPRESSION: Successful right IJ vein tunneled dialysis catheter with its tip in the right atrium.   Electronically Signed   By: Maryclare Bean M.D.   On: 03/03/2014 17:13   Dg Chest Port 1 View  03/05/2014   CLINICAL DATA:  Coronary bypass  EXAM: PORTABLE CHEST - 1 VIEW  COMPARISON:  03/04/2014  FINDINGS: Right IJ tunnel dialysis catheter tips in the SVC RA junction as before. Feeding tube and left IJ  dialysis catheters removed. Coronary bypass and prosthetic heart valve noted. Heart is enlarged with vascular congestion and mild interstitial edema pattern. Small left effusion noted. Persistent left base atelectasis/ consolidation obscuring the left hemidiaphragm. No pneumothorax.  IMPRESSION: Slight worsening interstitial edema pattern.   Electronically Signed   By: Ruel Favors M.D.   On: 03/05/2014 08:02   Dg Chest Port 1 View  03/04/2014   CLINICAL DATA:  Pulmonary edema.  EXAM: PORTABLE CHEST - 1 VIEW  COMPARISON:  03/03/2014.  FINDINGS: Bilateral dual-lumen IJ catheters in stable position. NG tube in good anatomic position. Prior CABG and cardiac valve replacement. Cardiomegaly. Interim improvement of pulmonary venous congestion and bilateral pulmonary edema. Left-sided pleural effusion. No pneumothorax.  IMPRESSION: 1. Lines and tubes in good anatomic position. 2. Interim improvement of congestive heart failure and pulmonary edema. Persistent left pleural effusion. 3. Prior CABG cardiac valve replacement.   Electronically Signed   By: Maisie Fus  Register   On: 03/04/2014 07:47   . aspirin  324 mg Per Tube Daily  . bisacodyl  10 mg Oral Daily   Or  . bisacodyl  10 mg Rectal Daily  . chlorhexidine  15 mL Mouth Rinse BID  .  darbepoetin (ARANESP) injection - DIALYSIS  60 mcg Intravenous Q Mon-HD  . feeding supplement (PRO-STAT SUGAR FREE 64)  30 mL Per Tube QID  . Gerhardt's butt cream   Topical TID  . insulin aspart  0-24 Units Subcutaneous 6 times per day  . insulin glargine  30 Units Subcutaneous BID  . levothyroxine  75 mcg Per Tube QAC breakfast  . midodrine  10 mg Oral TID WC  . pantoprazole sodium  40 mg Per Tube Q1200  . potassium & sodium phosphates  1 packet Per Tube TID  . sodium chloride  10-40 mL Intracatheter Q12H  . warfarin  5 mg Oral q1800  . Warfarin - Physician Dosing Inpatient   Does not apply q1800    BMET    Component Value Date/Time   NA 135* 03/04/2014 0415   K 3.9 03/04/2014 0415   CL 92* 03/04/2014 0415   CO2 26 03/04/2014 0415   GLUCOSE 172* 03/04/2014 0415   BUN 44* 03/04/2014 0415   CREATININE 3.79* 03/04/2014 0415   CALCIUM 9.3 03/04/2014 0415   GFRNONAA 11* 03/04/2014 0415   GFRAA 13* 03/04/2014 0415   CBC    Component Value Date/Time   WBC 12.3* 03/05/2014 0420   RBC 2.87* 03/05/2014 0420   HGB 8.5* 03/05/2014 0420   HCT 27.2* 03/05/2014 0420   PLT 217 03/05/2014 0420   MCV 94.8 03/05/2014 0420   MCH 29.6 03/05/2014 0420   MCHC 31.3 03/05/2014 0420   RDW 18.0* 03/05/2014 0420   LYMPHSABS 2.7 01/26/2014 1030   MONOABS 1.0 01/26/2014 1030   EOSABS 0.0 01/26/2014 1030   BASOSABS 0.0 01/26/2014 7688     73 year old admitted 7/12 with code STEMI underwent cath and CABG x 4 V and MVR 7/13 requiring ballon pump. Complicated by pulmonary HTN and s/p R thoracostomy for pleural effusion. She also had cardiogenic shock that requires pressor treatment and was started on CVVHD on 02/11/14 transitioned to IHD on 02/27/14.  Assessment/Plan:  1. AKI, oliguric and dialysis-dependent since 02/11/14. Transitioned from CVVHD to IHD 02/27/14 and second on 02/28/14.  1. Tolerating IHD with 3L UF and has been negative ~3L.   2. Volume status has improved and cont to follow.  3. Cont to follow closely and  see how she tolerates 3 sessions/week of HD given ongoing issues with hypotension and need for pressors. 2. Vascular access- s/p RIJ TDC per IR on 03/03/14.Will need eventual VVS evaluation for longterm vascular access although will be an issue while she is on coumadin and ongoing issues with cardiogenic shock/RV failure.  1. D/c temp trialysis catheter 3. CAD s/p CABG- still with hypotension requiring pressors 4. Cardiogenic shock/RV failure- cards following 5. Afib/flutter- on coumadin s/p DC cardioversion on 03/03/14 which was successful 6. 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 improved 7. Anemia- ABLA/acute illness and AKI.  1. Have started Aranesp this week 2. Follow H/H and iron stores 8. AMS- improved per nsg. 9. Protein malnutrition- cont with tube feeds 10. Dispo- for LTC facility placement per CTS. 11.   Keyunna Coco A

## 2014-03-05 NOTE — Progress Notes (Addendum)
2 Days Post-Op Procedure(s) (LRB): CARDIOVERSION (N/A) Subjective: Patient had a stable night   - now over 4 weeks after emergency CABG-mitral valve replacement with tissue valve for acute MI and cardiogenic shock  Patient has acute renal failure-ATN and is hemodialysis dependent. She's had hemodialysis on August 14, 17, and will be dialyzed today August 20.  She is currently in a sinus rhythm after cardioversion from atrial fibrillation, amiodarone infusion continues.  The patient is currently on IV heparin well her Coumadin is being loaded for her history of intermittent atrial for relation and a recent bioprosthetic MVR  The patient has been weaned off norepinephrine. The patient is on low-dose milrinone for her post MI RV and inferior wall LV dysfunction. Her blood pressures been stable. Her most recent echocardiogram shows EF of 40-45% with normal functioning mitral valve.  She is currently off antibiotics incisions are healed. She is very weak from her preoperative baseline state, long-term deconditioning, and dementia. She is been out of bed to chair using the hoyer lift and PT is working with patient  The patient has a postpyloric feeding tube. She's had sequential swallow evaluations including FE ES which is shown slow improvement but unsafe swallowing currently because of risk of aspiration   Objective: Vital signs in last 24 hours: Temp:  [97.3 F (36.3 C)-98.3 F (36.8 C)] 97.4 F (36.3 C) (08/20 0800) Pulse Rate:  [90-102] 92 (08/20 0700) Cardiac Rhythm:  [-] Normal sinus rhythm (08/20 0600) Resp:  [14-31] 24 (08/20 0700) BP: (88-126)/(37-73) 102/51 mmHg (08/20 0700) SpO2:  [93 %-100 %] 99 % (08/20 0700) Weight:  [194 lb 0.1 oz (88 kg)] 194 lb 0.1 oz (88 kg) (08/19 1100)  Hemodynamic parameters for last 24 hours: CVP:  [8 mmHg-29 mmHg] 9 mmHg  Intake/Output from previous day: 08/19 0701 - 08/20 0700 In: 2251.6 [I.V.:991.6; NG/GT:1260] Out: 3001  Intake/Output this  shift: Total I/O In: 50 [NG/GT:50] Out: -   Exam Alert responsive, voice worse Lungs clear Heart rhythm regular without murmur Abdomen soft Extremities warm Skin turgor reduced   Lab Results:  Recent Labs  03/04/14 0415 03/05/14 0420  WBC 11.6* 12.3*  HGB 8.2* 8.5*  HCT 25.8* 27.2*  PLT 214 217   BMET:  Recent Labs  03/03/14 0400 03/04/14 0415  NA 137 135*  K 4.1 3.9  CL 95* 92*  CO2 26 26  GLUCOSE 183* 172*  BUN 30* 44*  CREATININE 2.52* 3.79*  CALCIUM 9.0 9.3    PT/INR:  Recent Labs  03/05/14 0420  LABPROT 19.4*  INR 1.64*   ABG    Component Value Date/Time   PHART 7.463* 02/17/2014 1013   HCO3 26.6* 02/17/2014 1013   TCO2 23 02/28/2014 0548   ACIDBASEDEF 0.1 01/30/2014 0339   O2SAT 61.1 03/05/2014 0406   CBG (last 3)   Recent Labs  03/05/14 03/05/14 0400 03/05/14 0806  GLUCAP 162* 182* 155*    Assessment/Plan: S/P Procedure(s) (LRB): CARDIOVERSION (N/A) Continue IV heparin until Coumadin reaches INR target of 2.0 Prepare for transfer to L.TAC for prolonged milrinone wean, further hemodialysis, and long-term physical therapy   LOS: 39 days    VAN TRIGT III,Warrick Llera 03/05/2014

## 2014-03-05 NOTE — Progress Notes (Signed)
ANTICOAGULATION CONSULT NOTE - Follow Up Consult  Pharmacy Consult for Heparin  Indication: atrial fibrillation  No Known Allergies  Patient Measurements: Height: 5\' 7"  (170.2 cm) Weight: 194 lb 0.1 oz (88 kg) IBW/kg (Calculated) : 61.6  Vital Signs: Temp: 97.4 F (36.3 C) (08/20 0800) Temp src: Oral (08/20 0800) BP: 102/51 mmHg (08/20 0700) Pulse Rate: 92 (08/20 0700)  Labs:  Recent Labs  03/03/14 0400 03/04/14 0415 03/04/14 1836 03/05/14 0420 03/05/14 0950  HGB 8.6* 8.2*  --  8.5*  --   HCT 27.4* 25.8*  --  27.2*  --   PLT 191 214  --  217  --   LABPROT 16.3* 17.1*  --  19.4*  --   INR 1.31 1.39  --  1.64*  --   HEPARINUNFRC 0.69 0.20* 0.15*  --  0.32  CREATININE 2.52* 3.79*  --   --   --     Estimated Creatinine Clearance: 14.8 ml/min (by C-G formula based on Cr of 3.79).   Assessment: 73 y/o F transitioning to [facility] for acute renal failure post CABG.  She is on a heparin drip for prosthetic MVR and afib at 1950 units/hr, HL is now therapeutic at 0.32 after rate increase. H/H low but stable, plt wnl and stable, no reported s/s bleeding.   Goal of Therapy:  Heparin level: 0.3-0.5 per Dr. Donata Clay Monitor platelets by anticoagulation protocol: Yes   Plan:  - Continue heparin at 1950 units/hr - Daily heparin level and CBC - Monitor platelets closely and s/s bleeding -Continue Coumadin dosing per MD  Margie Billet, PharmD Clinical Pharmacist - Resident Pager: [phone] Pharmacy: [phone] 03/05/2014 11:13 AM

## 2014-03-05 NOTE — Discharge Summary (Signed)
[address]       [place] [address]             [phone]              Discharge Summary  Name: [name] DOB: 1940-08-13 74 y.o. MRN: [id number]   Admission Date: 01/25/2014 Discharge Date: 02/03/2014    Admitting Diagnosis: Inferior ST elevation myocardial infarction Ventricular tachycardia   Discharge Diagnosis:  Inferior ST elevation myocardial infarction Ventricular tachycardia Cardiogenic shock Severe multivessel coronary artery disease Severe pulmonary hypertension Severe mitral regurgitation Expected postoperative blood loss anemia Postoperative thrombocytopenia Ventilator dependent respiratory failure Postoperative toxic metabolic encephalopathy Postoperative atrial fibrillation/atrial flutter Postoperative bradycardia/junctional rhythm Bilateral pleural effusions Acute kidney injury Oliguric acute tubular necrosis, dialysis dependent Presumed hospital acquired pneumonia Severe right ventricular failure Postoperative deconditioning  Past Medical History  Diagnosis Date  . History of MI (myocardial infarction)     PCI done @ [facility] (cannot get cath report on Care Everywhere(  . Essential hypertension   . TIA (transient ischemic attack)   . Thyroid disease   . Anxiety   . Diabetes mellitus type 2 with peripheral artery disease     On insulin  . PAD (peripheral artery disease)   . Hyperlipidemia associated with type 2 diabetes mellitus   . A-fib   . ARF (acute renal failure)         Procedures: 1. EMERGENCY CORONARY ARTERY BYPASS GRAFTING X 4 (Left internal mammary artery to left anterior descending, saphenous vein graft to posterior descending, saphenous vein graft to obtuse marginal, saphenous vein graft to obtuse marginal 2) ENDOSCOPIC VEIN HARVEST BILATERAL LOWER EXTREMITIES MITRAL VALVE REPLACEMENT (25 mm [facility] Ease pericardial tissue valve) - 01/26/2014  2. RIGHT THORACENTESIS -  02/06/2014  3. BRONCHOSCOPY - 02/09/2014  4. CT GUIDED LEFT PLEURAL DRAINAGE CATHETER PLACEMENT - 02/09/2014  5. INSERTION OF LEFT INTERNAL JUGULAR HEMODIALYSIS CATHETER - 02/10/2014  6. DIRECT CURRENT CARDIOVERSION - 02/26/2014  7.DIRECT CURRENT CARDIOVERSION - 03/03/2014   8. PLACEMENT OF RIGHT INTERNAL JUGULAR HEMODIALYSIS CATHETER - 03/03/2014      HPI:  The patient is a 73 y.o. female with history of CAD, status post PCI at [facility], who developed chest pain and nausea on the date of admission. She presented to [facility] emergency department and EKG showed ST segment changes in the inferior leads, ruling her in for a STEMI. She had ventricular tachycardia and was started on Amiodarone, IV Heparin, Morphine and aspirin and transferred to [facility] for emergent cardiac catheterization and further treatment.     Hospital Course:  The patient was admitted to [facility] on 01/25/2014. She underwent emergency cardiac catheterization via right radial artery demonstrating occlusion of the RCA and high-grade stenosis of the LAD and circumflex vessels and an occluded diagonal. The patient had persistent ST segment elevation in the inferior leads and hypotension.  LVEDP was 32 and a short ventriculogram appeared to show EF of 40%.  A balloon pump was placed via the right femoral artery after aortogram showed mild aortoiliac disease, moderate renal artery stenosis bilaterally. Emergency CT surgical evaluation was requested and Dr. Donata Clay saw the patient in the cath lab.  Because of the patient's ongoing ST segment elevation and critical coronary anatomy, emergency surgical revascularization was recommended.  All risks, benefits and alternatives of surgery were explained in detail, and the patient agreed to proceed. The patient was taken to the operating room and  underwent the above procedure.    While the patient being prepared in the operating room with placement of  invasive monitoring  lines, she was noted have severe pulmonary  hypertension. The transesophageal echocardiogram in the operating room demonstrated severe mitral regurgitation with a fixed, calcified  posterior leaflet. The decision was made to combine emergency CABG with  mitral valve replacement.   She was transferred to the ICU on multiple pressors and intra-aortic balloon pump. The balloon pump was able to be removed on 7/17.   Urine output was sluggish and she was significantly volume overloaded, requiring aggressive diuresis. Tube feeds were started for nutritional support.  She had thrombocytopenia, but  HIT studies were negative. She also has been anemic, requiring transfusions of packed red blood cells.    Her pulmonary status remained tenuous requiring a prolonged course on the ventilator, complicated by presumed right lower lobe pneumonia with leukocytosis and fever.  She was started on empiric antibiotics.  She also was noted to have bilateral pleural effusions which did not respond to conservative measures.  Critical care medicine was enlisted to assist with vent management. She underwent bedside thoracentesis on the right on 7/24, and around 1.2 liters of fluid was drained. The patient was extubated 7/25 but required re-intubation 7/27. She underwent bedside therapeutic bronchoscopy showing no significant mucus, airways clear with the exception of what appeared to be suction trauma at the inlet of the right upper lobe. A CT guided left chest tube placement and drainage of a 1.3 liter effusion was performed by interventional radiology on 7/27. She was ultimately re-extubated on 7/31.  The left chest tube drainage decreased over time and the tube removed on 8/4.   Her renal function continued to worsen, and a nephrology consult was requested.  The patient was started on CVVHD on 7/29 via a left IJ catheter.  Her acute kidney injury appears to have stabilized but the patient remains oliguric and dialysis dependent.   She underwent removal of the left temporary catheter and a tunneled R IJ catheter was placed on 8/18.  She was converted to hemodialysis from CVVH and thus far has tolerated this well.   From a cardiac standpoint, she has had atrial fibrillation/flutter, as well as intermittent junctional bradycardia. She has had persistent cardiogenic shock and has required multiple pressors.  Echocardiogram on 7/20 showed EF 55-60% with grade III diastolic dysfunction, right ventricle normal.  Repeat echo on 8/4 revealed EF 35-40% with moderately hypokinetic right ventricle and TAPSE 1.18 cm. The Advanced Heart Failure team was consulted to assist with her care. She was started on dobutamine, but developed profound tachycardia, so this was discontinued. Echo repeated on 8/12 was unchanged.  It was felt that return to sinus rhythm might help improve her hemodynamics, therefore direct current cardioversion was performed on 8/13.  She reverted back to a-fib, and was cardioverted again on 8/18. She presently remains in sinus rhythm on Amiodarone and hemodynamics and co-ox are stable on Milrinone.  Coumadin is being loaded and she remains on IV Heparin for a-fib bioprosthetic mitral valve.  It is felt that beta blockers and ACE-I should be avoided due to shock and renal failure.   The patient has had postoperative confusion and mental status changes which are presently stable.  She is severely deconditioned, although she is working with physical therapy. A post-pyloric feeding tube has been placed for nutritional support, and sequential swallowing studies have shown improvement, although she remains at high risk for aspiration.  It  [facility]  Presently, she is medically stable for transfer once a bed becomes available.      Recent vital signs:  Filed Vitals:   03/06/14 1224  BP:   Pulse: 97  Temp:   Resp: 29    Recent laboratory studies:  CBC:  Recent Labs   03/05/14 0420 03/06/14 0315  WBC 12.3* 12.1*  HGB 8.5* 8.3*  HCT 27.2* 26.4*  PLT 217 247   BMET:   Recent Labs  03/04/14 0415 03/06/14 0315  NA 135* 136*  K 3.9 4.0  CL 92* 92*  CO2 26 23  GLUCOSE 172* 181*  BUN 44* 57*  CREATININE 3.79* 3.93*  CALCIUM 9.3 9.1    PT/INR:   Recent Labs  03/06/14 0315  LABPROT 22.5*  INR 1.98*     Discharge Medications:   Amiodarone Milrinone .25 mcg/kg/min Synthroid roxicette prn pain Insulin - sliding scale and split doses lantus Coumadin for INR goal 2-2.5 pepcid Renal vits           COLLINS,GINA H 03/06/2014, 2:43 PM

## 2014-03-06 ENCOUNTER — Inpatient Hospital Stay (HOSPITAL_COMMUNITY): Payer: Medicare Other

## 2014-03-06 ENCOUNTER — Inpatient Hospital Stay
Admission: AD | Admit: 2014-03-06 | Discharge: 2014-04-20 | Disposition: A | Discharge status: Skilled Nursing Facility | Payer: Self-pay | Source: Ambulatory Visit | Attending: Internal Medicine | Admitting: Internal Medicine

## 2014-03-06 ENCOUNTER — Other Ambulatory Visit (HOSPITAL_COMMUNITY): Payer: Self-pay

## 2014-03-06 DIAGNOSIS — I2510 Atherosclerotic heart disease of native coronary artery without angina pectoris: Secondary | ICD-10-CM

## 2014-03-06 DIAGNOSIS — N179 Acute kidney failure, unspecified: Secondary | ICD-10-CM

## 2014-03-06 DIAGNOSIS — R1310 Dysphagia, unspecified: Secondary | ICD-10-CM

## 2014-03-06 DIAGNOSIS — R570 Cardiogenic shock: Secondary | ICD-10-CM

## 2014-03-06 DIAGNOSIS — Z951 Presence of aortocoronary bypass graft: Secondary | ICD-10-CM

## 2014-03-06 LAB — GLUCOSE, CAPILLARY
Glucose-Capillary: 193 mg/dL — ABNORMAL HIGH (ref 70–99)
Glucose-Capillary: 221 mg/dL — ABNORMAL HIGH (ref 70–99)
Glucose-Capillary: 146 mg/dL — ABNORMAL HIGH (ref 70–99)
Glucose-Capillary: 141 mg/dL — ABNORMAL HIGH (ref 70–99)

## 2014-03-06 LAB — BASIC METABOLIC PANEL
Anion gap: 21 — ABNORMAL HIGH (ref 5–15)
BUN: 57 mg/dL — ABNORMAL HIGH (ref 6–23)
CO2: 23 mEq/L (ref 19–32)
Calcium: 9.1 mg/dL (ref 8.4–10.5)
Chloride: 92 mEq/L — ABNORMAL LOW (ref 96–112)
Creatinine, Ser: 3.93 mg/dL — ABNORMAL HIGH (ref 0.50–1.10)
GFR calc Af Amer: 12 mL/min — ABNORMAL LOW (ref >90)
GFR calc non Af Amer: 10 mL/min — ABNORMAL LOW (ref >90)
Glucose, Bld: 181 mg/dL — ABNORMAL HIGH (ref 70–99)
Potassium: 4 mEq/L (ref 3.7–5.3)
Sodium: 136 mEq/L — ABNORMAL LOW (ref 137–147)

## 2014-03-06 LAB — PROTIME-INR
INR: 1.98 — ABNORMAL HIGH (ref 0.00–1.49)
Prothrombin Time: 22.5 seconds — ABNORMAL HIGH (ref 11.6–15.2)

## 2014-03-06 LAB — FUNGUS CULTURE W SMEAR: Fungal Smear: NONE SEEN

## 2014-03-06 LAB — CARBOXYHEMOGLOBIN
Carboxyhemoglobin: 2.4 % — ABNORMAL HIGH (ref 0.5–1.5)
Methemoglobin: 0.7 % (ref 0.0–1.5)
O2 Saturation: 67.3 %
Total hemoglobin: 7.3 g/dL — ABNORMAL LOW (ref 12.0–16.0)

## 2014-03-06 LAB — CBC
HCT: 26.4 % — ABNORMAL LOW (ref 36.0–46.0)
Hemoglobin: 8.3 g/dL — ABNORMAL LOW (ref 12.0–15.0)
MCH: 29.5 pg (ref 26.0–34.0)
MCHC: 31.4 g/dL (ref 30.0–36.0)
MCV: 94 fL (ref 78.0–100.0)
Platelets: 247 10*3/uL (ref 150–400)
RBC: 2.81 MIL/uL — ABNORMAL LOW (ref 3.87–5.11)
RDW: 17.9 % — ABNORMAL HIGH (ref 11.5–15.5)
WBC: 12.1 10*3/uL — ABNORMAL HIGH (ref 4.0–10.5)

## 2014-03-06 LAB — HEPARIN LEVEL (UNFRACTIONATED): Heparin Unfractionated: 0.68 IU/mL (ref 0.30–0.70)

## 2014-03-06 LAB — MAGNESIUM: Magnesium: 2.1 mg/dL (ref 1.5–2.5)

## 2014-03-06 MED ORDER — INSULIN ASPART 100 UNIT/ML ~~LOC~~ SOLN
0.0000 [IU] | SUBCUTANEOUS | Status: DC
  Administered 2014-03-06 (×2): 2 [IU] via SUBCUTANEOUS
  Administered 2014-03-06: 8 [IU] via SUBCUTANEOUS

## 2014-03-06 MED ORDER — DARBEPOETIN ALFA-POLYSORBATE 60 MCG/0.3ML IJ SOLN: 60.0000 ug | INTRAMUSCULAR | Status: DC

## 2014-03-06 MED ORDER — NOREPINEPHRINE BITARTRATE 1 MG/ML IV SOLN
0.0000 ug/min | INTRAVENOUS | Status: DC
  Filled 2014-03-06: qty 16

## 2014-03-06 MED ORDER — LEVALBUTEROL HCL 0.63 MG/3ML IN NEBU: 0.6300 mg | INHALATION_SOLUTION | Freq: Four times a day (QID) | RESPIRATORY_TRACT | Status: DC | PRN

## 2014-03-06 MED ORDER — INSULIN ASPART 100 UNIT/ML ~~LOC~~ SOLN: 0.0000 [IU] | SUBCUTANEOUS | Status: DC

## 2014-03-06 MED ORDER — ACETAMINOPHEN 160 MG/5ML PO SOLN: 325.0000 mg | ORAL | Status: DC | PRN

## 2014-03-06 MED ORDER — AMIODARONE HCL 200 MG PO TABS
200.0000 mg | ORAL_TABLET | Freq: Two times a day (BID) | ORAL | Status: DC
  Filled 2014-03-06: qty 1

## 2014-03-06 MED ORDER — ALBUMIN HUMAN 25 % IV SOLN
12.5000 g | Freq: Once | INTRAVENOUS | Status: AC
  Administered 2014-03-06: 12.5 g via INTRAVENOUS

## 2014-03-06 MED ORDER — NOREPINEPHRINE BITARTRATE 1 MG/ML IV SOLN: INTRAVENOUS | Status: DC

## 2014-03-06 MED ORDER — NEPRO/CARBSTEADY PO LIQD: 237.0000 mL | ORAL | Status: DC | PRN

## 2014-03-06 MED ORDER — PANTOPRAZOLE SODIUM 40 MG PO PACK: 40.0000 mg | PACK | Freq: Every day | ORAL | Status: DC

## 2014-03-06 MED ORDER — ASPIRIN 81 MG PO CHEW: 324.0000 mg | CHEWABLE_TABLET | Freq: Every day | ORAL | Status: DC

## 2014-03-06 MED ORDER — AMIODARONE HCL 200 MG PO TABS: 200.0000 mg | ORAL_TABLET | Freq: Two times a day (BID) | ORAL | Status: DC

## 2014-03-06 MED ORDER — NEPRO/CARBSTEADY PO LIQD: 1000.0000 mL | ORAL | Status: DC

## 2014-03-06 MED ORDER — ONDANSETRON HCL 4 MG/2ML IJ SOLN: 4.0000 mg | Freq: Four times a day (QID) | INTRAMUSCULAR | Status: DC | PRN

## 2014-03-06 MED ORDER — HEPARIN SODIUM (PORCINE) 1000 UNIT/ML DIALYSIS: 20.0000 [IU]/kg | INTRAMUSCULAR | Status: DC | PRN

## 2014-03-06 MED ORDER — INSULIN GLARGINE 100 UNIT/ML ~~LOC~~ SOLN: 35.0000 [IU] | Freq: Two times a day (BID) | SUBCUTANEOUS | Status: DC

## 2014-03-06 MED ORDER — LEVOTHYROXINE SODIUM 75 MCG PO TABS: 75.0000 ug | ORAL_TABLET | Freq: Every day | ORAL | Status: DC

## 2014-03-06 MED ORDER — INSULIN GLARGINE 100 UNIT/ML ~~LOC~~ SOLN
35.0000 [IU] | Freq: Two times a day (BID) | SUBCUTANEOUS | Status: DC
  Administered 2014-03-06: 35 [IU] via SUBCUTANEOUS
  Filled 2014-03-06 (×2): qty 0.35

## 2014-03-06 MED ORDER — WARFARIN SODIUM 5 MG PO TABS: 5.0000 mg | ORAL_TABLET | Freq: Every day | ORAL | Status: DC

## 2014-03-06 MED ORDER — PRO-STAT SUGAR FREE PO LIQD: 30.0000 mL | Freq: Four times a day (QID) | ORAL | Status: DC

## 2014-03-06 MED ORDER — POTASSIUM & SODIUM PHOSPHATES 280-160-250 MG PO PACK: 1.0000 | PACK | Freq: Three times a day (TID) | ORAL | Status: DC

## 2014-03-06 MED ORDER — MILRINONE IN DEXTROSE 20 MG/100ML IV SOLN: 0.2500 ug/kg/min | INTRAVENOUS | Status: DC

## 2014-03-06 NOTE — Progress Notes (Signed)
Speech Language Pathology Treatment: Dysphagia  Patient Details Name: ZOEYANN GRAPER MRN: [id number] DOB: May 12, 1941 Today's Date: 03/06/2014 Time: 1222-1242 SLP Time Calculation (min): 20 min  Assessment / Plan / Recommendation Clinical Impression  Therapeutic dysphagia treatment session for continued diagnostic assessment with po's.  Pt. agreeable to trials nectar liquids following oral hygiene.  Consumed 4 sips before politely refusing additional trials with delayed cough x 1. Pt. should continue non oral means for adequate nutrition due to consistent refusing of food/liquid.  FEES performed 8/18 without penetration or aspiration was observed given limited trials, and moderate residuals. Recommend SLP continue trials puree and nectar conservatively as vocal quality significantly dysphonic this session thus decreasing airway protection.  Plan is for transfer to LTAC.   HPI HPI: Patient with past medical history of DM, MI, HTN, admitted with MI and underwent emergent CABG x 4 and MVR on 7/13. Post op course complicated by VDRF and bilateral pleural effusions and requiring CVVHD. Extubated 7/31.    Pertinent Vitals Pain Assessment: No/denies pain  SLP Plan  Continue with current plan of care    Recommendations Diet recommendations: NPO Medication Administration: Via alternative means              Oral Care Recommendations: Oral care BID Follow up Recommendations: LTACH;24 hour supervision/assistance Plan: Continue with current plan of care    GO     Royce Macadamia 03/06/2014, 1:04 PM [phone]

## 2014-03-06 NOTE — Progress Notes (Signed)
Pt transferred to [facility] via bed, VS stable at time of transfer, Levophed titrated off. Family aware of room change. No c/o pain or other concerns at this time. Belongings at bedside. Bedside handoff to [place], [place].

## 2014-03-06 NOTE — Progress Notes (Signed)
Report called to [facility] RN, Ronita Hipps.

## 2014-03-06 NOTE — Progress Notes (Signed)
PT Cancellation Note  Patient Details Name: [name] MRN: [id number] DOB: 10/12/1940   Cancelled Treatment:    Reason Eval/Treat Not Completed: Patient at procedure or test/unavailable (Pt in HD earlier and now fatigued.)   Deonte Otting 03/06/2014, 3:05 PM

## 2014-03-06 NOTE — Procedures (Signed)
Patient was seen on dialysis and the procedure was supervised. BFR 400 Via RIJ TDC BP is 101/48.  Patient appears to be tolerating treatment well, although occ drops in BP.  Keep MAP>=60 and use sodium modeling to help maintain BP.  Ok to dose IV albumin prn to keep MAP >60.  For possible transfer to [facility] and would try to keep CVP around 12 due to RV failure and preload dependence.

## 2014-03-06 NOTE — Progress Notes (Signed)
3 Days Post-Op Procedure(s) (LRB): CARDIOVERSION (N/A) Subjective: oob to chair this am nsr     for HD today INR 2 so iv heparin DC'ed  Objective: Vital signs in last 24 hours: Temp:  [98 F (36.7 C)-98.7 F (37.1 C)] 98.2 F (36.8 C) (08/21 0354) Pulse Rate:  [83-105] 88 (08/21 0700) Cardiac Rhythm:  [-] Normal sinus rhythm;Sinus tachycardia (08/21 0400) Resp:  [13-31] 26 (08/21 0700) BP: (90-120)/(39-87) 91/49 mmHg (08/21 0700) SpO2:  [92 %-100 %] 97 % (08/21 0700)  Hemodynamic parameters for last 24 hours: CVP:  [1 mmHg-13 mmHg] 10 mmHg  Intake/Output from previous day: 08/20 0701 - 08/21 0700 In: 2202.5 [I.V.:892.5; NG/GT:1060; IV Piggyback:250] Out: 2 [Stool:2] Intake/Output this shift:    Alert  Responsive abd soft CXR clear  Lab Results:  Recent Labs  03/05/14 0420 03/06/14 0315  WBC 12.3* 12.1*  HGB 8.5* 8.3*  HCT 27.2* 26.4*  PLT 217 247   BMET:  Recent Labs  03/04/14 0415 03/06/14 0315  NA 135* 136*  K 3.9 4.0  CL 92* 92*  CO2 26 23  GLUCOSE 172* 181*  BUN 44* 57*  CREATININE 3.79* 3.93*  CALCIUM 9.3 9.1    PT/INR:  Recent Labs  03/06/14 0315  LABPROT 22.5*  INR 1.98*   ABG    Component Value Date/Time   PHART 7.463* 02/17/2014 1013   HCO3 26.6* 02/17/2014 1013   TCO2 23 02/28/2014 0548   ACIDBASEDEF 0.1 01/30/2014 0339   O2SAT 67.3 03/06/2014 0450   CBG (last 3)   Recent Labs  03/05/14 1923 03/05/14 2340 03/06/14 0349  GLUCAP 189* 169* 193*    Assessment/Plan: S/P Procedure(s) (LRB): CARDIOVERSION (N/A) Cont coumadin 5 mg daily Ready for [facility] when bed avail  LOS: 40 days    VAN TRIGT III,PETER 03/06/2014

## 2014-03-07 ENCOUNTER — Other Ambulatory Visit (HOSPITAL_COMMUNITY): Payer: Self-pay

## 2014-03-07 LAB — COMPREHENSIVE METABOLIC PANEL
ALBUMIN: 3.3 g/dL — AB (ref 3.5–5.2)
ALT: 6 U/L (ref 0–35)
AST: 24 U/L (ref 0–37)
Alkaline Phosphatase: 82 U/L (ref 39–117)
Anion gap: 20 — ABNORMAL HIGH (ref 5–15)
BUN: 42 mg/dL — ABNORMAL HIGH (ref 6–23)
CALCIUM: 9.5 mg/dL (ref 8.4–10.5)
CO2: 23 mEq/L (ref 19–32)
CREATININE: 2.98 mg/dL — AB (ref 0.50–1.10)
Chloride: 92 mEq/L — ABNORMAL LOW (ref 96–112)
GFR calc Af Amer: 17 mL/min — ABNORMAL LOW (ref >90)
GFR calc non Af Amer: 14 mL/min — ABNORMAL LOW (ref >90)
Glucose, Bld: 222 mg/dL — ABNORMAL HIGH (ref 70–99)
Potassium: 4.6 mEq/L (ref 3.7–5.3)
SODIUM: 135 meq/L — AB (ref 137–147)
TOTAL PROTEIN: 7.9 g/dL (ref 6.0–8.3)
Total Bilirubin: 0.6 mg/dL (ref 0.3–1.2)

## 2014-03-07 LAB — LIPID PANEL
CHOL/HDL RATIO: 5.6 ratio
CHOLESTEROL: 129 mg/dL (ref 0–200)
HDL: 23 mg/dL — AB (ref >39)
LDL Cholesterol: 68 mg/dL (ref 0–99)
TRIGLYCERIDES: 191 mg/dL — AB (ref <150)
VLDL: 38 mg/dL (ref 0–40)

## 2014-03-07 LAB — URINALYSIS, ROUTINE W REFLEX MICROSCOPIC
Glucose, UA: NEGATIVE mg/dL
Ketones, ur: 15 mg/dL — AB
Nitrite: NEGATIVE
Protein, ur: 100 mg/dL — AB
Specific Gravity, Urine: 1.028 (ref 1.005–1.030)
UROBILINOGEN UA: 0.2 mg/dL (ref 0.0–1.0)
pH: 7 (ref 5.0–8.0)

## 2014-03-07 LAB — CBC WITH DIFFERENTIAL/PLATELET
BASOS ABS: 0.1 10*3/uL (ref 0.0–0.1)
BASOS PCT: 1 % (ref 0–1)
EOS PCT: 2 % (ref 0–5)
Eosinophils Absolute: 0.2 10*3/uL (ref 0.0–0.7)
HCT: 26.6 % — ABNORMAL LOW (ref 36.0–46.0)
Hemoglobin: 8.1 g/dL — ABNORMAL LOW (ref 12.0–15.0)
Lymphocytes Relative: 25 % (ref 12–46)
Lymphs Abs: 2.7 10*3/uL (ref 0.7–4.0)
MCH: 28.9 pg (ref 26.0–34.0)
MCHC: 30.5 g/dL (ref 30.0–36.0)
MCV: 95 fL (ref 78.0–100.0)
Monocytes Absolute: 0.7 10*3/uL (ref 0.1–1.0)
Monocytes Relative: 7 % (ref 3–12)
Neutro Abs: 7.1 10*3/uL (ref 1.7–7.7)
Neutrophils Relative %: 65 % (ref 43–77)
Platelets: 219 10*3/uL (ref 150–400)
RBC: 2.8 MIL/uL — ABNORMAL LOW (ref 3.87–5.11)
RDW: 17.7 % — AB (ref 11.5–15.5)
WBC: 10.8 10*3/uL — AB (ref 4.0–10.5)

## 2014-03-07 LAB — BASIC METABOLIC PANEL
Anion gap: 22 — ABNORMAL HIGH (ref 5–15)
BUN: 60 mg/dL — AB (ref 6–23)
CHLORIDE: 90 meq/L — AB (ref 96–112)
CO2: 23 mEq/L (ref 19–32)
Calcium: 9.4 mg/dL (ref 8.4–10.5)
Creatinine, Ser: 3.71 mg/dL — ABNORMAL HIGH (ref 0.50–1.10)
GFR calc Af Amer: 13 mL/min — ABNORMAL LOW (ref >90)
GFR calc non Af Amer: 11 mL/min — ABNORMAL LOW (ref >90)
GLUCOSE: 176 mg/dL — AB (ref 70–99)
POTASSIUM: 5 meq/L (ref 3.7–5.3)
Sodium: 135 mEq/L — ABNORMAL LOW (ref 137–147)

## 2014-03-07 LAB — PHOSPHORUS: Phosphorus: 5.6 mg/dL — ABNORMAL HIGH (ref 2.3–4.6)

## 2014-03-07 LAB — CBC
HEMATOCRIT: 27.9 % — AB (ref 36.0–46.0)
Hemoglobin: 8.7 g/dL — ABNORMAL LOW (ref 12.0–15.0)
MCH: 29.1 pg (ref 26.0–34.0)
MCHC: 31.2 g/dL (ref 30.0–36.0)
MCV: 93.3 fL (ref 78.0–100.0)
Platelets: 237 10*3/uL (ref 150–400)
RBC: 2.99 MIL/uL — ABNORMAL LOW (ref 3.87–5.11)
RDW: 17.4 % — ABNORMAL HIGH (ref 11.5–15.5)
WBC: 13 10*3/uL — AB (ref 4.0–10.5)

## 2014-03-07 LAB — URINE MICROSCOPIC-ADD ON

## 2014-03-07 LAB — HEMOGLOBIN A1C
Hgb A1c MFr Bld: 6.3 % — ABNORMAL HIGH (ref <5.7)
Mean Plasma Glucose: 134 mg/dL — ABNORMAL HIGH (ref <117)

## 2014-03-07 LAB — TROPONIN I: Troponin I: 0.56 ng/mL (ref <0.30)

## 2014-03-07 LAB — PROTIME-INR
INR: 2.57 — AB (ref 0.00–1.49)
INR: 2.61 — ABNORMAL HIGH (ref 0.00–1.49)
PROTHROMBIN TIME: 27.9 s — AB (ref 11.6–15.2)
Prothrombin Time: 27.6 seconds — ABNORMAL HIGH (ref 11.6–15.2)

## 2014-03-07 LAB — PREALBUMIN: PREALBUMIN: 13.1 mg/dL — AB (ref 17.0–34.0)

## 2014-03-07 LAB — CK TOTAL AND CKMB (NOT AT ARMC)
CK, MB: 2.1 ng/mL (ref 0.3–4.0)
RELATIVE INDEX: INVALID (ref 0.0–2.5)
Total CK: 41 U/L (ref 7–177)

## 2014-03-07 LAB — PROCALCITONIN: Procalcitonin: 2.81 ng/mL

## 2014-03-07 LAB — MAGNESIUM: MAGNESIUM: 2.1 mg/dL (ref 1.5–2.5)

## 2014-03-07 LAB — TSH: TSH: 10.81 u[IU]/mL — ABNORMAL HIGH (ref 0.350–4.500)

## 2014-03-07 LAB — T4, FREE: FREE T4: 1.12 ng/dL (ref 0.80–1.80)

## 2014-03-07 LAB — CLOSTRIDIUM DIFFICILE BY PCR: CDIFFPCR: NEGATIVE

## 2014-03-08 LAB — BASIC METABOLIC PANEL
ANION GAP: 20 — AB (ref 5–15)
BUN: 78 mg/dL — ABNORMAL HIGH (ref 6–23)
CALCIUM: 9.4 mg/dL (ref 8.4–10.5)
CHLORIDE: 91 meq/L — AB (ref 96–112)
CO2: 25 meq/L (ref 19–32)
Creatinine, Ser: 4.32 mg/dL — ABNORMAL HIGH (ref 0.50–1.10)
GFR calc Af Amer: 11 mL/min — ABNORMAL LOW (ref >90)
GFR calc non Af Amer: 9 mL/min — ABNORMAL LOW (ref >90)
Glucose, Bld: 162 mg/dL — ABNORMAL HIGH (ref 70–99)
Potassium: 5.7 mEq/L — ABNORMAL HIGH (ref 3.7–5.3)
Sodium: 136 mEq/L — ABNORMAL LOW (ref 137–147)

## 2014-03-08 LAB — PROTIME-INR
INR: 2.84 — ABNORMAL HIGH (ref 0.00–1.49)
PROTHROMBIN TIME: 29.8 s — AB (ref 11.6–15.2)

## 2014-03-08 LAB — CBC
HCT: 27.7 % — ABNORMAL LOW (ref 36.0–46.0)
Hemoglobin: 8.7 g/dL — ABNORMAL LOW (ref 12.0–15.0)
MCH: 29.3 pg (ref 26.0–34.0)
MCHC: 31.4 g/dL (ref 30.0–36.0)
MCV: 93.3 fL (ref 78.0–100.0)
PLATELETS: 240 10*3/uL (ref 150–400)
RBC: 2.97 MIL/uL — AB (ref 3.87–5.11)
RDW: 17.3 % — ABNORMAL HIGH (ref 11.5–15.5)
WBC: 15.1 10*3/uL — AB (ref 4.0–10.5)

## 2014-03-09 DIAGNOSIS — Z951 Presence of aortocoronary bypass graft: Secondary | ICD-10-CM

## 2014-03-09 DIAGNOSIS — R570 Cardiogenic shock: Secondary | ICD-10-CM

## 2014-03-09 LAB — CBC
HCT: 25.5 % — ABNORMAL LOW (ref 36.0–46.0)
HEMOGLOBIN: 8.3 g/dL — AB (ref 12.0–15.0)
MCH: 29.7 pg (ref 26.0–34.0)
MCHC: 32.5 g/dL (ref 30.0–36.0)
MCV: 91.4 fL (ref 78.0–100.0)
PLATELETS: 264 10*3/uL (ref 150–400)
RBC: 2.79 MIL/uL — ABNORMAL LOW (ref 3.87–5.11)
RDW: 17.1 % — AB (ref 11.5–15.5)
WBC: 14.1 10*3/uL — ABNORMAL HIGH (ref 4.0–10.5)

## 2014-03-09 LAB — CBC WITH DIFFERENTIAL/PLATELET
BASOS ABS: 0.1 10*3/uL (ref 0.0–0.1)
BASOS PCT: 1 % (ref 0–1)
EOS ABS: 0.3 10*3/uL (ref 0.0–0.7)
Eosinophils Relative: 2 % (ref 0–5)
HCT: 26.7 % — ABNORMAL LOW (ref 36.0–46.0)
Hemoglobin: 8.3 g/dL — ABNORMAL LOW (ref 12.0–15.0)
LYMPHS ABS: 3.7 10*3/uL (ref 0.7–4.0)
Lymphocytes Relative: 28 % (ref 12–46)
MCH: 28.5 pg (ref 26.0–34.0)
MCHC: 31.1 g/dL (ref 30.0–36.0)
MCV: 91.8 fL (ref 78.0–100.0)
Monocytes Absolute: 0.7 10*3/uL (ref 0.1–1.0)
Monocytes Relative: 5 % (ref 3–12)
NEUTROS PCT: 64 % (ref 43–77)
Neutro Abs: 8.5 10*3/uL — ABNORMAL HIGH (ref 1.7–7.7)
Platelets: 268 10*3/uL (ref 150–400)
RBC: 2.91 MIL/uL — AB (ref 3.87–5.11)
RDW: 17.2 % — AB (ref 11.5–15.5)
WBC: 13.3 10*3/uL — AB (ref 4.0–10.5)

## 2014-03-09 LAB — RENAL FUNCTION PANEL
ANION GAP: 25 — AB (ref 5–15)
Albumin: 2.8 g/dL — ABNORMAL LOW (ref 3.5–5.2)
BUN: 124 mg/dL — ABNORMAL HIGH (ref 6–23)
CO2: 21 mEq/L (ref 19–32)
Calcium: 8.4 mg/dL (ref 8.4–10.5)
Chloride: 87 mEq/L — ABNORMAL LOW (ref 96–112)
Creatinine, Ser: 5.62 mg/dL — ABNORMAL HIGH (ref 0.50–1.10)
GFR, EST AFRICAN AMERICAN: 8 mL/min — AB (ref >90)
GFR, EST NON AFRICAN AMERICAN: 7 mL/min — AB (ref >90)
Glucose, Bld: 211 mg/dL — ABNORMAL HIGH (ref 70–99)
PHOSPHORUS: 13.3 mg/dL — AB (ref 2.3–4.6)
POTASSIUM: 6.1 meq/L — AB (ref 3.7–5.3)
SODIUM: 133 meq/L — AB (ref 137–147)

## 2014-03-09 LAB — BASIC METABOLIC PANEL
ANION GAP: 25 — AB (ref 5–15)
BUN: 112 mg/dL — ABNORMAL HIGH (ref 6–23)
CO2: 23 mEq/L (ref 19–32)
Calcium: 9 mg/dL (ref 8.4–10.5)
Chloride: 85 mEq/L — ABNORMAL LOW (ref 96–112)
Creatinine, Ser: 5.36 mg/dL — ABNORMAL HIGH (ref 0.50–1.10)
GFR, EST AFRICAN AMERICAN: 8 mL/min — AB (ref >90)
GFR, EST NON AFRICAN AMERICAN: 7 mL/min — AB (ref >90)
Glucose, Bld: 186 mg/dL — ABNORMAL HIGH (ref 70–99)
POTASSIUM: 5.5 meq/L — AB (ref 3.7–5.3)
SODIUM: 133 meq/L — AB (ref 137–147)

## 2014-03-09 LAB — URINALYSIS, ROUTINE W REFLEX MICROSCOPIC
Bilirubin Urine: NEGATIVE
Glucose, UA: NEGATIVE mg/dL
KETONES UR: NEGATIVE mg/dL
NITRITE: NEGATIVE
Protein, ur: 100 mg/dL — AB
Specific Gravity, Urine: 1.02 (ref 1.005–1.030)
UROBILINOGEN UA: 0.2 mg/dL (ref 0.0–1.0)
pH: 6.5 (ref 5.0–8.0)

## 2014-03-09 LAB — URINE MICROSCOPIC-ADD ON

## 2014-03-09 LAB — PROTIME-INR
INR: 3.77 — ABNORMAL HIGH (ref 0.00–1.49)
Prothrombin Time: 37.2 seconds — ABNORMAL HIGH (ref 11.6–15.2)

## 2014-03-09 NOTE — Progress Notes (Signed)
Patient ID: [name], female   DOB: 01-Dec-1940, 73 y.o.   MRN: [id number] Advanced Heart Failure Rounding Note   Subjective:    [name] is a 73 yo female with a history of CAD s/p PCI at [facility], HTN, TIA, DM2 and PAD. She presented to [facility] 01/25/14 with CP,Vtach and ST changes in inferior leads and was taken to the cath lab. She had severe multivessel CAD involving 100% distal RCA with tandem 90% & 80% lesions, proximal D1 100%, proximal LAD & AVGroove Circ 90% with 80% OM2 and was evaluated by Dr. Maren Beach for CABG and IABP was placed.  Underwent emergent CABGx4 and MV replacement (LIMA-LAD, SVG to OM1, SVG to OM2 and SVG to PDA) on 01/26/14. She was transferred to the ICU on multiple pressors and IABP. UOP was sluggish and remained volume overloaded and lasix gtt was started. IABP removed 7/17. TF started for nutrition. Had difficulty with thrombocytopenia which improved and HIT studies negative and Coumadin was restarted. Underwent thoracentesis on R on 02/06/14. Extubated 7/25 but re-intubated 7/27 electively for therapeutic bronchoscopy showing no significant mucus, airways clear w/ exception of what appeared to be suction trauma at the inlet of the RUL. Underwent L CT by IR on 7/27. Her Creatinine was stable for awhile and then started trending up even on milrinone. Underwent HD cath placement 7/28 and started on CVVHD on 7/29. Extubated again 02/13/14. Has had issues with Afib/Aflutter.   ECHO 02/17/14. Reviewed personally EF 35-40% Inferior wall out. RV moderately HK with TAPSE 1.18cm  Now getting HD 3x week. Cardioverted last week and is maintaining NSR. Getting tube feeds, off gtts.  She remains confused.    Objective:   Weight Range:  Vital Signs:   BP 122/63, HR 70s NSR   Physical Exam:  General:  Chronically ill appearing. No resp difficulty, confused HEENT: normal; Panda intact; L IJ HD cath Neck: supple. JVP 8-9 Carotids 2+ bilat; no bruits. No lymphadenopathy or thryomegaly  appreciated. Cor: PMI nondisplaced. Regular rate & irregular rhythm. No rubs, gallops or murmurs. Lungs: Diminished in the bases Abdomen: soft, nontender, nondistended. No hepatosplenomegaly. No bruits or masses. Good bowel sounds. Extremities: no cyanosis, clubbing, rash, no edema Neuro: alert, affect flat, not interactive.  Telemetry: NSR 70s  Labs: Basic Metabolic Panel:  Recent Labs Lab 03/03/14 0400 03/04/14 0415 03/05/14 0420 03/06/14 0315 03/07/14 0610 03/07/14 1840 03/08/14 0637 03/09/14 0500  NA 137 135*  --  136* 135* 135* 136* 133*  K 4.1 3.9  --  4.0 4.6 5.0 5.7* 5.5*  CL 95* 92*  --  92* 92* 90* 91* 85*  CO2 26 26  --  23 23 23 25 23   GLUCOSE 183* 172*  --  181* 222* 176* 162* 186*  BUN 30* 44*  --  57* 42* 60* 78* 112*  CREATININE 2.52* 3.79*  --  3.93* 2.98* 3.71* 4.32* 5.36*  CALCIUM 9.0 9.3  --  9.1 9.5 9.4 9.4 9.0  MG 2.0 2.1 2.1 2.1 2.1  --   --   --   PHOS 4.5 5.8*  --   --  5.6*  --   --   --     Liver Function Tests:  Recent Labs Lab 03/03/14 0400 03/04/14 0415 03/07/14 0610  AST  --   --  24  ALT  --   --  6  ALKPHOS  --   --  82  BILITOT  --   --  0.6  PROT  --   --  7.9  ALBUMIN 2.9* 2.7* 3.3*   No results found for this basename: LIPASE, AMYLASE,  in the last 168 hours No results found for this basename: AMMONIA,  in the last 168 hours  CBC:  Recent Labs Lab 03/06/14 0315 03/07/14 0610 03/07/14 1840 03/08/14 0637 03/09/14 0500  WBC 12.1* 10.8* 13.0* 15.1* 13.3*  NEUTROABS  --  7.1  --   --  8.5*  HGB 8.3* 8.1* 8.7* 8.7* 8.3*  HCT 26.4* 26.6* 27.9* 27.7* 26.7*  MCV 94.0 95.0 93.3 93.3 91.8  PLT 247 219 237 240 268    Cardiac Enzymes:  Recent Labs Lab 03/07/14 0610  CKTOTAL 41  CKMB 2.1  TROPONINI 0.56*    BNP: BNP (last 3 results) No results found for this basename: PROBNP,  in the last 8760 hours   Other results:  EKG: AF  Imaging: No results found.   Medications:    Midodrine 5 mg tid ASA 325  daily Amiodarone 200 bid Atorvastatin 20 daily  Assessment:   1) CAD - s/p CABG x 4 with MV replacement 2) A/C respiratory failure 3) AKI -  Was on CVVH, now HD.  4) Cardiogenic shock 5) Afib/atypical A Flutter: Cardioverted to NSR.  6) L Pleural effusion - s/p L CT 7) Anemia 8) Delerium, acute 9) ? 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  Getting HD tiw.  No ACEI or beta blocker yet with recent cardiogenic shock and overall soft BP.  Will decrease amiodarone to 200 mg daily at this point.   Marca Ancona, MD  03/09/2014

## 2014-03-10 ENCOUNTER — Other Ambulatory Visit (HOSPITAL_COMMUNITY): Payer: Self-pay

## 2014-03-10 LAB — PROTIME-INR
INR: 4.66 — AB (ref 0.00–1.49)
Prothrombin Time: 43.9 seconds — ABNORMAL HIGH (ref 11.6–15.2)

## 2014-03-11 ENCOUNTER — Other Ambulatory Visit (HOSPITAL_COMMUNITY): Payer: Self-pay

## 2014-03-11 LAB — RENAL FUNCTION PANEL
Albumin: 2.7 g/dL — ABNORMAL LOW (ref 3.5–5.2)
Anion gap: 21 — ABNORMAL HIGH (ref 5–15)
BUN: 98 mg/dL — AB (ref 6–23)
CALCIUM: 8.9 mg/dL (ref 8.4–10.5)
CO2: 23 mEq/L (ref 19–32)
Chloride: 88 mEq/L — ABNORMAL LOW (ref 96–112)
Creatinine, Ser: 4.37 mg/dL — ABNORMAL HIGH (ref 0.50–1.10)
GFR calc non Af Amer: 9 mL/min — ABNORMAL LOW (ref >90)
GFR, EST AFRICAN AMERICAN: 11 mL/min — AB (ref >90)
GLUCOSE: 180 mg/dL — AB (ref 70–99)
PHOSPHORUS: 9.6 mg/dL — AB (ref 2.3–4.6)
POTASSIUM: 5.1 meq/L (ref 3.7–5.3)
Sodium: 132 mEq/L — ABNORMAL LOW (ref 137–147)

## 2014-03-11 LAB — CBC
HEMATOCRIT: 27.9 % — AB (ref 36.0–46.0)
Hemoglobin: 8.6 g/dL — ABNORMAL LOW (ref 12.0–15.0)
MCH: 28.6 pg (ref 26.0–34.0)
MCHC: 30.8 g/dL (ref 30.0–36.0)
MCV: 92.7 fL (ref 78.0–100.0)
PLATELETS: 311 10*3/uL (ref 150–400)
RBC: 3.01 MIL/uL — ABNORMAL LOW (ref 3.87–5.11)
RDW: 17.3 % — AB (ref 11.5–15.5)
WBC: 14.2 10*3/uL — AB (ref 4.0–10.5)

## 2014-03-11 LAB — PROTIME-INR
INR: 2.86 — AB (ref 0.00–1.49)
Prothrombin Time: 30 seconds — ABNORMAL HIGH (ref 11.6–15.2)

## 2014-03-12 LAB — PROTIME-INR
INR: 1.67 — ABNORMAL HIGH (ref 0.00–1.49)
Prothrombin Time: 19.7 seconds — ABNORMAL HIGH (ref 11.6–15.2)

## 2014-03-12 NOTE — H&P (Signed)
[name] is an 73 y.o. female.   Chief Complaint:  Chest pain;STEMI CABG and MVR 01/2014 Acute kidney injury-- on HD Recent +UTI -- started treatment 8/26 Dysphagia--existing NG Protein calorie malnutrition Long term care Request for percutaneous gastric tube placement in IR Radiologist has reviewed films---feels pt does have safe window for perc access Now scheduled for G tube 8/28 Pt will be on 3rd day of Levaquin treatmant for UTI  Off coumadin now several days (afib) INR 1.6 today Plan for perc G tube 8/28  HPI: CAD/MI- CABG/MVR; HTN; TIA; HLD; Afib; ARF--HD   Past Medical History  Diagnosis Date  . History of MI (myocardial infarction)     PCI done @ [facility] (cannot get cath report on Care Everywhere(  . Essential hypertension   . TIA (transient ischemic attack)   . Thyroid disease   . Anxiety   . Diabetes mellitus type 2 with peripheral artery disease     On insulin  . PAD (peripheral artery disease)   . Hyperlipidemia associated with type 2 diabetes mellitus   . A-fib   . ARF (acute renal failure)     Past Surgical History  Procedure Laterality Date  . Cardiac catheterization      PCI - unknown vessel or stent; unknown date; @ [facility].  . Abdominal hysterectomy    . Cholecystectomy    . Coronary artery bypass graft N/A 01/25/2014    Procedure: CORONARY ARTERY BYPASS GRAFTING TIMES FOUR USING LEFT INTERNAL MAMMARY ARTERY TO LAD, SAPHENOUS VEIN GRAFTS TO OM1, OM2, AND PDA;  Surgeon: Ivin Poot, MD;  Location: [place];  Service: Open Heart Surgery;  Laterality: N/A;  . Mitral valve replacement N/A 01/25/2014    Procedure: MITRAL VALVE (MV) REPLACEMENT;  Surgeon: Ivin Poot, MD;  Location: [place];  Service: Open Heart Surgery;  Laterality: N/A;    Family History  Problem Relation Age of Onset  . Heart attack Mother   . Heart disease Mother   . Heart attack Father   . Heart disease Father    Social History:  reports that she has never smoked.  She has never used smokeless tobacco. She reports that she does not drink alcohol or use illicit drugs.  Allergies: No Known Allergies  Medications Prior to Admission  Medication Sig Dispense Refill  . acetaminophen (TYLENOL) 160 MG/5ML solution Take 10.2 mLs (325 mg total) by mouth every 4 (four) hours as needed for moderate pain.  120 mL  0  . Amino Acids-Protein Hydrolys (FEEDING SUPPLEMENT, PRO-STAT SUGAR FREE 64,) LIQD Place 30 mLs into feeding tube 4 (four) times daily.  900 mL  0  . amiodarone (PACERONE) 200 MG tablet Place 1 tablet (200 mg total) into feeding tube 2 (two) times daily.      Marland Kitchen aspirin 81 MG chewable tablet Place 4 tablets (324 mg total) into feeding tube daily.      . darbepoetin (ARANESP) 60 MCG/0.3ML SOLN injection Inject 0.3 mLs (60 mcg total) into the vein every Monday with hemodialysis.  4.2 mL    . insulin glargine (LANTUS) 100 UNIT/ML injection Inject 0.35 mLs (35 Units total) into the skin 2 (two) times daily.  10 mL  11  . levalbuterol (XOPENEX) 0.63 MG/3ML nebulizer solution Take 3 mLs (0.63 mg total) by nebulization every 6 (six) hours as needed for wheezing or shortness of breath.  3 mL  12  . levothyroxine (SYNTHROID, LEVOTHROID) 75 MCG tablet Place 1 tablet (75 mcg  total) into feeding tube daily before breakfast.      . milrinone (PRIMACOR) 20 MG/100ML SOLN infusion Inject 20.975 mcg/min into the vein continuous.  100 mL    . norepinephrine 16 mg in dextrose 5 % 250 mL Titrate by 0.5-2 mcg/min every 10 minutes to to keep bp > 85 SAP      . Nutritional Supplements (FEEDING SUPPLEMENT, NEPRO CARB STEADY,) LIQD Take 1,000 mLs by mouth continuous.    0  . Nutritional Supplements (FEEDING SUPPLEMENT, NEPRO CARB STEADY,) LIQD Take 237 mLs by mouth as needed (missed meal during dialysis.).    0  . ondansetron (ZOFRAN) 4 MG/2ML SOLN injection Inject 2 mLs (4 mg total) into the vein every 6 (six) hours as needed for nausea or vomiting.  2 mL  0  . pantoprazole sodium  (PROTONIX) 40 mg/20 mL PACK Place 20 mLs (40 mg total) into feeding tube daily at 12 noon.  30 each    . potassium & sodium phosphates (PHOS-NAK) 280-160-250 MG PACK Place 1 packet into feeding tube 3 (three) times daily.  120 packet    . warfarin (COUMADIN) 5 MG tablet Take 1 tablet (5 mg total) by mouth daily at 6 PM.        Results for orders placed during the hospital encounter of 03/06/14 (from the past 48 hour(s))  PROTIME-INR     Status: Abnormal   Collection Time    03/11/14  6:00 AM      Result Value Ref Range   Prothrombin Time 30.0 (*) 11.6 - 15.2 seconds   INR 2.86 (*) 0.00 - 1.49  CBC     Status: Abnormal   Collection Time    03/11/14  6:00 AM      Result Value Ref Range   WBC 14.2 (*) 4.0 - 10.5 K/uL   RBC 3.01 (*) 3.87 - 5.11 MIL/uL   Hemoglobin 8.6 (*) 12.0 - 15.0 g/dL   HCT 27.9 (*) 36.0 - 46.0 %   MCV 92.7  78.0 - 100.0 fL   MCH 28.6  26.0 - 34.0 pg   MCHC 30.8  30.0 - 36.0 g/dL   RDW 17.3 (*) 11.5 - 15.5 %   Platelets 311  150 - 400 K/uL  RENAL FUNCTION PANEL     Status: Abnormal   Collection Time    03/11/14  6:00 AM      Result Value Ref Range   Sodium 132 (*) 137 - 147 mEq/L   Potassium 5.1  3.7 - 5.3 mEq/L   Chloride 88 (*) 96 - 112 mEq/L   CO2 23  19 - 32 mEq/L   Glucose, Bld 180 (*) 70 - 99 mg/dL   BUN 98 (*) 6 - 23 mg/dL   Creatinine, Ser 4.37 (*) 0.50 - 1.10 mg/dL   Calcium 8.9  8.4 - 10.5 mg/dL   Phosphorus 9.6 (*) 2.3 - 4.6 mg/dL   Albumin 2.7 (*) 3.5 - 5.2 g/dL   GFR calc non Af Amer 9 (*) >90 mL/min   GFR calc Af Amer 11 (*) >90 mL/min   Comment: (NOTE)     The eGFR has been calculated using the CKD EPI equation.     This calculation has not been validated in all clinical situations.     eGFR's persistently <90 mL/min signify possible Chronic Kidney     Disease.   Anion gap 21 (*) 5 - 15  PROTIME-INR     Status: Abnormal   Collection Time  03/12/14  6:30 AM      Result Value Ref Range   Prothrombin Time 19.7 (*) 11.6 - 15.2 seconds    INR 1.67 (*) 0.00 - 1.49   Dg Abd Portable 1v  03/11/2014   CLINICAL DATA:  NG placement  EXAM: PORTABLE ABDOMEN - 1 VIEW  COMPARISON:  03/10/2014  FINDINGS: Visualization is limited due to superimposed EKG wires but there appears to be a nasoenteric type tube in the upper left upper quadrant consistent with location in the upper stomach. Bowel gas pattern is normal. Surgical clips right upper quadrant. Previous feeding tube is been removed.  IMPRESSION: Enteric tube tip appears to be a in the upper stomach region.   Electronically Signed   By: Lucienne Capers M.D.   On: 03/11/2014 23:04   Dg Abd Portable 1v  03/10/2014   CLINICAL DATA:  Panda placement.  EXAM: PORTABLE ABDOMEN - 1 VIEW  COMPARISON:  03/06/2014 and 03/02/2014  FINDINGS: Feeding tube is present which appears to have been pulled back and now is coiled over the stomach with tip just right of midline in the mid abdomen likely over the distal stomach/proximal duodenum. Bowel gas pattern is nonobstructive. Remainder of the exam is unchanged.  IMPRESSION: Feeding tube has been pulled back and now is coiled over the stomach with tip right of midline in the mid abdomen likely over the distal stomach or proximal duodenum.   Electronically Signed   By: Marin Olp M.D.   On: 03/10/2014 19:26    Review of Systems  Constitutional: Positive for weight loss. Negative for fever.  Respiratory: Positive for sputum production. Negative for wheezing.   Cardiovascular: Negative for chest pain.  Gastrointestinal: Negative for nausea, vomiting and abdominal pain.  Neurological: Positive for weakness.    There were no vitals taken for this visit. Physical Exam  Cardiovascular: Normal rate and regular rhythm.   No murmur heard. Respiratory: Effort normal and breath sounds normal. She has no wheezes.  GI: Soft. Bowel sounds are normal. There is no tenderness.  Musculoskeletal: Normal range of motion.  Can move all 4s to command  Neurological:  She is alert.  Skin: Skin is warm and dry.  Psychiatric:  Pt focuses on me Non verbal NG in place  Consent in chart---signed by family member     Assessment/Plan Dysphagia Malnutrition Long term care Scheduled for percutaneous G tube placement in IR 8/28 +UTI--started Levaquin 8/26 Off coumadin several days---INR 1.67 today; will recheck in am Consent signed andin chart   Diann Bangerter A 03/12/2014, 10:31 AM

## 2014-03-13 ENCOUNTER — Other Ambulatory Visit (HOSPITAL_COMMUNITY): Payer: Self-pay

## 2014-03-13 LAB — RENAL FUNCTION PANEL
ANION GAP: 20 — AB (ref 5–15)
Albumin: 2.8 g/dL — ABNORMAL LOW (ref 3.5–5.2)
BUN: 104 mg/dL — ABNORMAL HIGH (ref 6–23)
CO2: 23 meq/L (ref 19–32)
Calcium: 8.8 mg/dL (ref 8.4–10.5)
Chloride: 87 mEq/L — ABNORMAL LOW (ref 96–112)
Creatinine, Ser: 4.39 mg/dL — ABNORMAL HIGH (ref 0.50–1.10)
GFR calc non Af Amer: 9 mL/min — ABNORMAL LOW (ref >90)
GFR, EST AFRICAN AMERICAN: 10 mL/min — AB (ref >90)
GLUCOSE: 175 mg/dL — AB (ref 70–99)
POTASSIUM: 4.8 meq/L (ref 3.7–5.3)
Phosphorus: 7.9 mg/dL — ABNORMAL HIGH (ref 2.3–4.6)
SODIUM: 130 meq/L — AB (ref 137–147)

## 2014-03-13 LAB — URINE CULTURE: Colony Count: >=100000

## 2014-03-13 LAB — CBC
HCT: 27.4 % — ABNORMAL LOW (ref 36.0–46.0)
Hemoglobin: 8.4 g/dL — ABNORMAL LOW (ref 12.0–15.0)
MCH: 28.9 pg (ref 26.0–34.0)
MCHC: 30.7 g/dL (ref 30.0–36.0)
MCV: 94.2 fL (ref 78.0–100.0)
Platelets: 255 10*3/uL (ref 150–400)
RBC: 2.91 MIL/uL — AB (ref 3.87–5.11)
RDW: 17.7 % — ABNORMAL HIGH (ref 11.5–15.5)
WBC: 11.3 10*3/uL — AB (ref 4.0–10.5)

## 2014-03-13 LAB — APTT: aPTT: 84 seconds — ABNORMAL HIGH (ref 24–37)

## 2014-03-13 LAB — PROTIME-INR
INR: 1.6 — AB (ref 0.00–1.49)
PROTHROMBIN TIME: 19.1 s — AB (ref 11.6–15.2)

## 2014-03-13 MED ORDER — LIDOCAINE HCL 1 % IJ SOLN
INTRAMUSCULAR | Status: AC
  Filled 2014-03-13: qty 20

## 2014-03-13 MED ORDER — MIDAZOLAM HCL 2 MG/2ML IJ SOLN
INTRAMUSCULAR | Status: AC | PRN
  Administered 2014-03-13: 0.5 mg via INTRAVENOUS

## 2014-03-13 MED ORDER — MIDAZOLAM HCL 2 MG/2ML IJ SOLN
INTRAMUSCULAR | Status: AC
  Filled 2014-03-13: qty 2

## 2014-03-13 MED ORDER — LIDOCAINE-EPINEPHRINE (PF) 1 %-1:200000 IJ SOLN
INTRAMUSCULAR | Status: AC
  Filled 2014-03-13: qty 10

## 2014-03-13 MED ORDER — GLUCAGON HCL RDNA (DIAGNOSTIC) 1 MG IJ SOLR
INTRAMUSCULAR | Status: AC
  Filled 2014-03-13: qty 1

## 2014-03-13 MED ORDER — GLUCAGON HCL (RDNA) 1 MG IJ SOLR
INTRAMUSCULAR | Status: AC | PRN
  Administered 2014-03-13: 1 mg via INTRAVENOUS

## 2014-03-13 MED ORDER — FENTANYL CITRATE 0.05 MG/ML IJ SOLN
INTRAMUSCULAR | Status: AC
  Filled 2014-03-13: qty 2

## 2014-03-13 MED ORDER — SODIUM CHLORIDE 0.9 % IV SOLN
Freq: Once | INTRAVENOUS | Status: AC
  Administered 2014-04-09: 12:00:00 via INTRAVENOUS

## 2014-03-13 MED ORDER — CEFAZOLIN SODIUM-DEXTROSE 2-3 GM-% IV SOLR
2.0000 g | Freq: Once | INTRAVENOUS | Status: AC
  Administered 2014-03-13: 2 g via INTRAVENOUS

## 2014-03-13 MED ORDER — FENTANYL CITRATE 0.05 MG/ML IJ SOLN
INTRAMUSCULAR | Status: AC | PRN
  Administered 2014-03-13: 25 ug via INTRAVENOUS

## 2014-03-13 MED ORDER — IOHEXOL 300 MG/ML  SOLN
100.0000 mL | Freq: Once | INTRAMUSCULAR | Status: AC | PRN
  Administered 2014-03-13: 15 mL

## 2014-03-13 NOTE — Procedures (Signed)
Successful fluoroscopic guided insertion of gastrostomy tube without immediate post procedural complicatoin.   The gastrostomy tube may be used immediately for medications.  Tube feeds may be initiated in 24 hours as per the primary team.   

## 2014-03-14 LAB — CBC
HCT: 27.1 % — ABNORMAL LOW (ref 36.0–46.0)
HEMOGLOBIN: 8.3 g/dL — AB (ref 12.0–15.0)
MCH: 28.4 pg (ref 26.0–34.0)
MCHC: 30.6 g/dL (ref 30.0–36.0)
MCV: 92.8 fL (ref 78.0–100.0)
Platelets: 195 10*3/uL (ref 150–400)
RBC: 2.92 MIL/uL — ABNORMAL LOW (ref 3.87–5.11)
RDW: 17.8 % — AB (ref 11.5–15.5)
WBC: 12.9 10*3/uL — ABNORMAL HIGH (ref 4.0–10.5)

## 2014-03-14 LAB — BASIC METABOLIC PANEL
Anion gap: 19 — ABNORMAL HIGH (ref 5–15)
BUN: 61 mg/dL — AB (ref 6–23)
CHLORIDE: 90 meq/L — AB (ref 96–112)
CO2: 24 mEq/L (ref 19–32)
CREATININE: 2.89 mg/dL — AB (ref 0.50–1.10)
Calcium: 8.5 mg/dL (ref 8.4–10.5)
GFR, EST AFRICAN AMERICAN: 17 mL/min — AB (ref >90)
GFR, EST NON AFRICAN AMERICAN: 15 mL/min — AB (ref >90)
GLUCOSE: 197 mg/dL — AB (ref 70–99)
POTASSIUM: 4.3 meq/L (ref 3.7–5.3)
Sodium: 133 mEq/L — ABNORMAL LOW (ref 137–147)

## 2014-03-14 LAB — PREPARE FRESH FROZEN PLASMA: UNIT DIVISION: 0

## 2014-03-14 LAB — PROTIME-INR
INR: 1.48 (ref 0.00–1.49)
Prothrombin Time: 17.9 seconds — ABNORMAL HIGH (ref 11.6–15.2)

## 2014-03-15 LAB — PROTIME-INR
INR: 1.59 — AB (ref 0.00–1.49)
Prothrombin Time: 19 seconds — ABNORMAL HIGH (ref 11.6–15.2)

## 2014-03-16 LAB — PHOSPHORUS: Phosphorus: 8.1 mg/dL — ABNORMAL HIGH (ref 2.3–4.6)

## 2014-03-16 LAB — PRO B NATRIURETIC PEPTIDE: PRO B NATRI PEPTIDE: 29632 pg/mL — AB (ref 0–125)

## 2014-03-16 LAB — BASIC METABOLIC PANEL
ANION GAP: 21 — AB (ref 5–15)
BUN: 118 mg/dL — ABNORMAL HIGH (ref 6–23)
CHLORIDE: 88 meq/L — AB (ref 96–112)
CO2: 22 meq/L (ref 19–32)
Calcium: 9 mg/dL (ref 8.4–10.5)
Creatinine, Ser: 4.69 mg/dL — ABNORMAL HIGH (ref 0.50–1.10)
GFR calc non Af Amer: 8 mL/min — ABNORMAL LOW (ref >90)
GFR, EST AFRICAN AMERICAN: 10 mL/min — AB (ref >90)
Glucose, Bld: 162 mg/dL — ABNORMAL HIGH (ref 70–99)
POTASSIUM: 4.3 meq/L (ref 3.7–5.3)
SODIUM: 131 meq/L — AB (ref 137–147)

## 2014-03-16 LAB — MAGNESIUM: MAGNESIUM: 2.4 mg/dL (ref 1.5–2.5)

## 2014-03-16 LAB — CBC WITH DIFFERENTIAL/PLATELET
BASOS ABS: 0 10*3/uL (ref 0.0–0.1)
BASOS PCT: 0 % (ref 0–1)
Eosinophils Absolute: 0.2 10*3/uL (ref 0.0–0.7)
Eosinophils Relative: 1 % (ref 0–5)
HEMATOCRIT: 27.9 % — AB (ref 36.0–46.0)
Hemoglobin: 8.6 g/dL — ABNORMAL LOW (ref 12.0–15.0)
LYMPHS PCT: 22 % (ref 12–46)
Lymphs Abs: 2.8 10*3/uL (ref 0.7–4.0)
MCH: 28.6 pg (ref 26.0–34.0)
MCHC: 30.8 g/dL (ref 30.0–36.0)
MCV: 92.7 fL (ref 78.0–100.0)
Monocytes Absolute: 0.6 10*3/uL (ref 0.1–1.0)
Monocytes Relative: 5 % (ref 3–12)
NEUTROS PCT: 72 % (ref 43–77)
Neutro Abs: 8.8 10*3/uL — ABNORMAL HIGH (ref 1.7–7.7)
Platelets: 271 10*3/uL (ref 150–400)
RBC: 3.01 MIL/uL — ABNORMAL LOW (ref 3.87–5.11)
RDW: 17.5 % — AB (ref 11.5–15.5)
WBC: 12.3 10*3/uL — AB (ref 4.0–10.5)

## 2014-03-16 LAB — PROTIME-INR
INR: 1.76 — ABNORMAL HIGH (ref 0.00–1.49)
PROTHROMBIN TIME: 20.5 s — AB (ref 11.6–15.2)

## 2014-03-16 LAB — PREALBUMIN: PREALBUMIN: 14 mg/dL — AB (ref 17.0–34.0)

## 2014-03-17 LAB — PTH, INTACT AND CALCIUM
Calcium, Total (PTH): 8.5 mg/dL (ref 8.4–10.5)
PTH: 178 pg/mL — AB (ref 14–64)

## 2014-03-17 LAB — PROTIME-INR
INR: 1.82 — ABNORMAL HIGH (ref 0.00–1.49)
Prothrombin Time: 21.1 seconds — ABNORMAL HIGH (ref 11.6–15.2)

## 2014-03-18 LAB — RENAL FUNCTION PANEL
Albumin: 2.7 g/dL — ABNORMAL LOW (ref 3.5–5.2)
Anion gap: 18 — ABNORMAL HIGH (ref 5–15)
BUN: 95 mg/dL — ABNORMAL HIGH (ref 6–23)
CALCIUM: 8.6 mg/dL (ref 8.4–10.5)
CO2: 24 mEq/L (ref 19–32)
CREATININE: 3.92 mg/dL — AB (ref 0.50–1.10)
Chloride: 87 mEq/L — ABNORMAL LOW (ref 96–112)
GFR calc Af Amer: 12 mL/min — ABNORMAL LOW (ref >90)
GFR, EST NON AFRICAN AMERICAN: 10 mL/min — AB (ref >90)
Glucose, Bld: 160 mg/dL — ABNORMAL HIGH (ref 70–99)
PHOSPHORUS: 6.1 mg/dL — AB (ref 2.3–4.6)
Potassium: 4.3 mEq/L (ref 3.7–5.3)
Sodium: 129 mEq/L — ABNORMAL LOW (ref 137–147)

## 2014-03-18 LAB — CBC
HEMATOCRIT: 28.5 % — AB (ref 36.0–46.0)
HEMOGLOBIN: 8.7 g/dL — AB (ref 12.0–15.0)
MCH: 27.6 pg (ref 26.0–34.0)
MCHC: 30.5 g/dL (ref 30.0–36.0)
MCV: 90.5 fL (ref 78.0–100.0)
Platelets: 265 10*3/uL (ref 150–400)
RBC: 3.15 MIL/uL — ABNORMAL LOW (ref 3.87–5.11)
RDW: 17.1 % — ABNORMAL HIGH (ref 11.5–15.5)
WBC: 13 10*3/uL — ABNORMAL HIGH (ref 4.0–10.5)

## 2014-03-18 LAB — PROTIME-INR
INR: 1.99 — ABNORMAL HIGH (ref 0.00–1.49)
Prothrombin Time: 22.6 seconds — ABNORMAL HIGH (ref 11.6–15.2)

## 2014-03-19 ENCOUNTER — Other Ambulatory Visit (HOSPITAL_COMMUNITY): Payer: Self-pay

## 2014-03-19 LAB — PROTIME-INR
INR: 2.02 — ABNORMAL HIGH (ref 0.00–1.49)
Prothrombin Time: 22.9 seconds — ABNORMAL HIGH (ref 11.6–15.2)

## 2014-03-20 LAB — CBC
HEMATOCRIT: 29.2 % — AB (ref 36.0–46.0)
HEMOGLOBIN: 8.8 g/dL — AB (ref 12.0–15.0)
MCH: 27.6 pg (ref 26.0–34.0)
MCHC: 30.1 g/dL (ref 30.0–36.0)
MCV: 91.5 fL (ref 78.0–100.0)
Platelets: 250 10*3/uL (ref 150–400)
RBC: 3.19 MIL/uL — AB (ref 3.87–5.11)
RDW: 17.1 % — ABNORMAL HIGH (ref 11.5–15.5)
WBC: 13.5 10*3/uL — ABNORMAL HIGH (ref 4.0–10.5)

## 2014-03-20 LAB — RENAL FUNCTION PANEL
Albumin: 2.7 g/dL — ABNORMAL LOW (ref 3.5–5.2)
Anion gap: 15 (ref 5–15)
BUN: 95 mg/dL — ABNORMAL HIGH (ref 6–23)
CHLORIDE: 92 meq/L — AB (ref 96–112)
CO2: 27 meq/L (ref 19–32)
CREATININE: 3.98 mg/dL — AB (ref 0.50–1.10)
Calcium: 9 mg/dL (ref 8.4–10.5)
GFR calc Af Amer: 12 mL/min — ABNORMAL LOW (ref >90)
GFR calc non Af Amer: 10 mL/min — ABNORMAL LOW (ref >90)
Glucose, Bld: 175 mg/dL — ABNORMAL HIGH (ref 70–99)
Phosphorus: 6.2 mg/dL — ABNORMAL HIGH (ref 2.3–4.6)
Potassium: 4.4 mEq/L (ref 3.7–5.3)
Sodium: 134 mEq/L — ABNORMAL LOW (ref 137–147)

## 2014-03-20 LAB — PROTIME-INR
INR: 2.48 — ABNORMAL HIGH (ref 0.00–1.49)
Prothrombin Time: 26.8 seconds — ABNORMAL HIGH (ref 11.6–15.2)

## 2014-03-21 ENCOUNTER — Other Ambulatory Visit (HOSPITAL_COMMUNITY): Payer: Self-pay

## 2014-03-21 LAB — CBC
HCT: 29.9 % — ABNORMAL LOW (ref 36.0–46.0)
Hemoglobin: 9 g/dL — ABNORMAL LOW (ref 12.0–15.0)
MCH: 27.7 pg (ref 26.0–34.0)
MCHC: 30.1 g/dL (ref 30.0–36.0)
MCV: 92 fL (ref 78.0–100.0)
Platelets: 233 10*3/uL (ref 150–400)
RBC: 3.25 MIL/uL — ABNORMAL LOW (ref 3.87–5.11)
RDW: 17 % — ABNORMAL HIGH (ref 11.5–15.5)
WBC: 9.5 10*3/uL (ref 4.0–10.5)

## 2014-03-21 LAB — COMPREHENSIVE METABOLIC PANEL
ALBUMIN: 2.7 g/dL — AB (ref 3.5–5.2)
ALK PHOS: 76 U/L (ref 39–117)
ALT: 6 U/L (ref 0–35)
AST: 18 U/L (ref 0–37)
Anion gap: 17 — ABNORMAL HIGH (ref 5–15)
BUN: 59 mg/dL — ABNORMAL HIGH (ref 6–23)
CO2: 22 mEq/L (ref 19–32)
Calcium: 8.5 mg/dL (ref 8.4–10.5)
Chloride: 91 mEq/L — ABNORMAL LOW (ref 96–112)
Creatinine, Ser: 2.89 mg/dL — ABNORMAL HIGH (ref 0.50–1.10)
GFR calc Af Amer: 17 mL/min — ABNORMAL LOW (ref >90)
GFR calc non Af Amer: 15 mL/min — ABNORMAL LOW (ref >90)
Glucose, Bld: 208 mg/dL — ABNORMAL HIGH (ref 70–99)
POTASSIUM: 5 meq/L (ref 3.7–5.3)
SODIUM: 130 meq/L — AB (ref 137–147)
TOTAL PROTEIN: 7.4 g/dL (ref 6.0–8.3)
Total Bilirubin: 0.6 mg/dL (ref 0.3–1.2)

## 2014-03-21 LAB — AFB CULTURE WITH SMEAR (NOT AT ARMC): ACID FAST SMEAR: NONE SEEN

## 2014-03-21 LAB — PROTIME-INR
INR: 2.69 — AB (ref 0.00–1.49)
PROTHROMBIN TIME: 28.6 s — AB (ref 11.6–15.2)

## 2014-03-21 LAB — LIPASE, BLOOD: Lipase: 56 U/L (ref 11–59)

## 2014-03-21 LAB — AMYLASE: Amylase: 42 U/L (ref 0–105)

## 2014-03-22 ENCOUNTER — Other Ambulatory Visit (HOSPITAL_COMMUNITY): Payer: Self-pay

## 2014-03-22 LAB — PROTIME-INR
INR: 3.62 — ABNORMAL HIGH (ref 0.00–1.49)
PROTHROMBIN TIME: 36.1 s — AB (ref 11.6–15.2)

## 2014-03-23 LAB — RENAL FUNCTION PANEL
Albumin: 2.8 g/dL — ABNORMAL LOW (ref 3.5–5.2)
Anion gap: 17 — ABNORMAL HIGH (ref 5–15)
BUN: 101 mg/dL — ABNORMAL HIGH (ref 6–23)
CO2: 23 meq/L (ref 19–32)
Calcium: 8.3 mg/dL — ABNORMAL LOW (ref 8.4–10.5)
Chloride: 91 mEq/L — ABNORMAL LOW (ref 96–112)
Creatinine, Ser: 4.6 mg/dL — ABNORMAL HIGH (ref 0.50–1.10)
GFR calc non Af Amer: 9 mL/min — ABNORMAL LOW (ref >90)
GFR, EST AFRICAN AMERICAN: 10 mL/min — AB (ref >90)
GLUCOSE: 191 mg/dL — AB (ref 70–99)
Phosphorus: 5.4 mg/dL — ABNORMAL HIGH (ref 2.3–4.6)
Potassium: 5.3 mEq/L (ref 3.7–5.3)
Sodium: 131 mEq/L — ABNORMAL LOW (ref 137–147)

## 2014-03-23 LAB — CBC WITH DIFFERENTIAL/PLATELET
BASOS ABS: 0 10*3/uL (ref 0.0–0.1)
BASOS PCT: 0 % (ref 0–1)
Eosinophils Absolute: 0.3 10*3/uL (ref 0.0–0.7)
Eosinophils Relative: 3 % (ref 0–5)
HCT: 29.9 % — ABNORMAL LOW (ref 36.0–46.0)
Hemoglobin: 9.7 g/dL — ABNORMAL LOW (ref 12.0–15.0)
LYMPHS PCT: 33 % (ref 12–46)
Lymphs Abs: 3.5 10*3/uL (ref 0.7–4.0)
MCH: 29.8 pg (ref 26.0–34.0)
MCHC: 32.4 g/dL (ref 30.0–36.0)
MCV: 91.7 fL (ref 78.0–100.0)
Monocytes Absolute: 0.9 10*3/uL (ref 0.1–1.0)
Monocytes Relative: 9 % (ref 3–12)
NEUTROS ABS: 5.9 10*3/uL (ref 1.7–7.7)
Neutrophils Relative %: 55 % (ref 43–77)
Platelets: 250 10*3/uL (ref 150–400)
RBC: 3.26 MIL/uL — ABNORMAL LOW (ref 3.87–5.11)
RDW: 16.8 % — ABNORMAL HIGH (ref 11.5–15.5)
WBC: 10.7 10*3/uL — AB (ref 4.0–10.5)

## 2014-03-23 LAB — PROTIME-INR
INR: 8.91 — AB (ref 0.00–1.49)
PROTHROMBIN TIME: 72.9 s — AB (ref 11.6–15.2)

## 2014-03-23 LAB — PRO B NATRIURETIC PEPTIDE: PRO B NATRI PEPTIDE: 27500 pg/mL — AB (ref 0–125)

## 2014-03-24 LAB — PROTIME-INR
INR: 3.08 — AB (ref 0.00–1.49)
PROTHROMBIN TIME: 31.8 s — AB (ref 11.6–15.2)

## 2014-03-24 LAB — PREALBUMIN: Prealbumin: 15.3 mg/dL — ABNORMAL LOW (ref 17.0–34.0)

## 2014-03-25 LAB — CBC
HCT: 29.3 % — ABNORMAL LOW (ref 36.0–46.0)
HEMOGLOBIN: 9 g/dL — AB (ref 12.0–15.0)
MCH: 27.1 pg (ref 26.0–34.0)
MCHC: 30.7 g/dL (ref 30.0–36.0)
MCV: 88.3 fL (ref 78.0–100.0)
Platelets: 246 10*3/uL (ref 150–400)
RBC: 3.32 MIL/uL — AB (ref 3.87–5.11)
RDW: 16.6 % — ABNORMAL HIGH (ref 11.5–15.5)
WBC: 11.1 10*3/uL — AB (ref 4.0–10.5)

## 2014-03-25 LAB — PROTIME-INR
INR: 2.84 — AB (ref 0.00–1.49)
Prothrombin Time: 29.8 seconds — ABNORMAL HIGH (ref 11.6–15.2)

## 2014-03-25 LAB — RENAL FUNCTION PANEL
ALBUMIN: 2.6 g/dL — AB (ref 3.5–5.2)
ANION GAP: 15 (ref 5–15)
BUN: 71 mg/dL — ABNORMAL HIGH (ref 6–23)
CHLORIDE: 89 meq/L — AB (ref 96–112)
CO2: 23 meq/L (ref 19–32)
CREATININE: 3.89 mg/dL — AB (ref 0.50–1.10)
Calcium: 8.1 mg/dL — ABNORMAL LOW (ref 8.4–10.5)
GFR calc Af Amer: 12 mL/min — ABNORMAL LOW (ref >90)
GFR calc non Af Amer: 10 mL/min — ABNORMAL LOW (ref >90)
GLUCOSE: 154 mg/dL — AB (ref 70–99)
POTASSIUM: 4.2 meq/L (ref 3.7–5.3)
Phosphorus: 3.6 mg/dL (ref 2.3–4.6)
Sodium: 127 mEq/L — ABNORMAL LOW (ref 137–147)

## 2014-03-26 ENCOUNTER — Other Ambulatory Visit (HOSPITAL_COMMUNITY): Payer: Self-pay

## 2014-03-26 LAB — URINE MICROSCOPIC-ADD ON

## 2014-03-26 LAB — PROTIME-INR
INR: 3.02 — AB (ref 0.00–1.49)
PROTHROMBIN TIME: 31.3 s — AB (ref 11.6–15.2)

## 2014-03-26 LAB — CBC WITH DIFFERENTIAL/PLATELET
Basophils Absolute: 0 10*3/uL (ref 0.0–0.1)
Basophils Relative: 0 % (ref 0–1)
EOS ABS: 0.2 10*3/uL (ref 0.0–0.7)
Eosinophils Relative: 3 % (ref 0–5)
HCT: 29.9 % — ABNORMAL LOW (ref 36.0–46.0)
Hemoglobin: 8.9 g/dL — ABNORMAL LOW (ref 12.0–15.0)
LYMPHS ABS: 2.9 10*3/uL (ref 0.7–4.0)
Lymphocytes Relative: 32 % (ref 12–46)
MCH: 27.3 pg (ref 26.0–34.0)
MCHC: 29.8 g/dL — ABNORMAL LOW (ref 30.0–36.0)
MCV: 91.7 fL (ref 78.0–100.0)
Monocytes Absolute: 0.7 10*3/uL (ref 0.1–1.0)
Monocytes Relative: 8 % (ref 3–12)
NEUTROS ABS: 5 10*3/uL (ref 1.7–7.7)
NEUTROS PCT: 57 % (ref 43–77)
PLATELETS: 210 10*3/uL (ref 150–400)
RBC: 3.26 MIL/uL — AB (ref 3.87–5.11)
RDW: 16.7 % — ABNORMAL HIGH (ref 11.5–15.5)
WBC: 8.8 10*3/uL (ref 4.0–10.5)

## 2014-03-26 LAB — BASIC METABOLIC PANEL
Anion gap: 15 (ref 5–15)
BUN: 33 mg/dL — ABNORMAL HIGH (ref 6–23)
CHLORIDE: 92 meq/L — AB (ref 96–112)
CO2: 24 mEq/L (ref 19–32)
Calcium: 8.3 mg/dL — ABNORMAL LOW (ref 8.4–10.5)
Creatinine, Ser: 2.21 mg/dL — ABNORMAL HIGH (ref 0.50–1.10)
GFR calc Af Amer: 24 mL/min — ABNORMAL LOW (ref >90)
GFR, EST NON AFRICAN AMERICAN: 21 mL/min — AB (ref >90)
GLUCOSE: 183 mg/dL — AB (ref 70–99)
POTASSIUM: 3.7 meq/L (ref 3.7–5.3)
Sodium: 131 mEq/L — ABNORMAL LOW (ref 137–147)

## 2014-03-26 LAB — URINALYSIS, ROUTINE W REFLEX MICROSCOPIC
GLUCOSE, UA: NEGATIVE mg/dL
Ketones, ur: 15 mg/dL — AB
Nitrite: NEGATIVE
PROTEIN: 100 mg/dL — AB
Specific Gravity, Urine: 1.024 (ref 1.005–1.030)
Urobilinogen, UA: 0.2 mg/dL (ref 0.0–1.0)
pH: 7 (ref 5.0–8.0)

## 2014-03-27 LAB — RENAL FUNCTION PANEL
ALBUMIN: 2.6 g/dL — AB (ref 3.5–5.2)
Anion gap: 17 — ABNORMAL HIGH (ref 5–15)
BUN: 57 mg/dL — ABNORMAL HIGH (ref 6–23)
CO2: 23 mEq/L (ref 19–32)
Calcium: 8.4 mg/dL (ref 8.4–10.5)
Chloride: 90 mEq/L — ABNORMAL LOW (ref 96–112)
Creatinine, Ser: 3.33 mg/dL — ABNORMAL HIGH (ref 0.50–1.10)
GFR calc Af Amer: 15 mL/min — ABNORMAL LOW (ref >90)
GFR calc non Af Amer: 13 mL/min — ABNORMAL LOW (ref >90)
Glucose, Bld: 164 mg/dL — ABNORMAL HIGH (ref 70–99)
PHOSPHORUS: 3.3 mg/dL (ref 2.3–4.6)
Potassium: 3.6 mEq/L — ABNORMAL LOW (ref 3.7–5.3)
SODIUM: 130 meq/L — AB (ref 137–147)

## 2014-03-27 LAB — PROTIME-INR
INR: 3.12 — ABNORMAL HIGH (ref 0.00–1.49)
PROTHROMBIN TIME: 32.1 s — AB (ref 11.6–15.2)

## 2014-03-27 LAB — HEMOGLOBIN: HEMOGLOBIN: 8.8 g/dL — AB (ref 12.0–15.0)

## 2014-03-28 LAB — PROTIME-INR
INR: 3.02 — ABNORMAL HIGH (ref 0.00–1.49)
Prothrombin Time: 31.3 seconds — ABNORMAL HIGH (ref 11.6–15.2)

## 2014-03-29 ENCOUNTER — Other Ambulatory Visit (HOSPITAL_COMMUNITY): Payer: Self-pay

## 2014-03-29 LAB — CBC WITH DIFFERENTIAL/PLATELET
BASOS PCT: 0 % (ref 0–1)
Basophils Absolute: 0 10*3/uL (ref 0.0–0.1)
Eosinophils Absolute: 0.4 10*3/uL (ref 0.0–0.7)
Eosinophils Relative: 4 % (ref 0–5)
HCT: 28.1 % — ABNORMAL LOW (ref 36.0–46.0)
Hemoglobin: 8.6 g/dL — ABNORMAL LOW (ref 12.0–15.0)
Lymphocytes Relative: 35 % (ref 12–46)
Lymphs Abs: 4.1 10*3/uL — ABNORMAL HIGH (ref 0.7–4.0)
MCH: 27 pg (ref 26.0–34.0)
MCHC: 30.6 g/dL (ref 30.0–36.0)
MCV: 88.4 fL (ref 78.0–100.0)
Monocytes Absolute: 0.9 10*3/uL (ref 0.1–1.0)
Monocytes Relative: 8 % (ref 3–12)
NEUTROS ABS: 6.4 10*3/uL (ref 1.7–7.7)
NEUTROS PCT: 53 % (ref 43–77)
Platelets: 189 10*3/uL (ref 150–400)
RBC: 3.18 MIL/uL — AB (ref 3.87–5.11)
RDW: 16.5 % — ABNORMAL HIGH (ref 11.5–15.5)
WBC: 11.9 10*3/uL — ABNORMAL HIGH (ref 4.0–10.5)

## 2014-03-29 LAB — BASIC METABOLIC PANEL
Anion gap: 13 (ref 5–15)
BUN: 59 mg/dL — AB (ref 6–23)
CHLORIDE: 86 meq/L — AB (ref 96–112)
CO2: 27 mEq/L (ref 19–32)
Calcium: 8.3 mg/dL — ABNORMAL LOW (ref 8.4–10.5)
Creatinine, Ser: 3.3 mg/dL — ABNORMAL HIGH (ref 0.50–1.10)
GFR calc non Af Amer: 13 mL/min — ABNORMAL LOW (ref >90)
GFR, EST AFRICAN AMERICAN: 15 mL/min — AB (ref >90)
Glucose, Bld: 104 mg/dL — ABNORMAL HIGH (ref 70–99)
POTASSIUM: 3.6 meq/L — AB (ref 3.7–5.3)
Sodium: 126 mEq/L — ABNORMAL LOW (ref 137–147)

## 2014-03-29 LAB — PROTIME-INR
INR: 3.03 — AB (ref 0.00–1.49)
PROTHROMBIN TIME: 31.4 s — AB (ref 11.6–15.2)

## 2014-03-29 LAB — URINE CULTURE: Colony Count: 50000

## 2014-03-29 LAB — PHOSPHORUS: PHOSPHORUS: 3.2 mg/dL (ref 2.3–4.6)

## 2014-03-29 LAB — MAGNESIUM: MAGNESIUM: 2 mg/dL (ref 1.5–2.5)

## 2014-03-30 LAB — RENAL FUNCTION PANEL
Albumin: 2.4 g/dL — ABNORMAL LOW (ref 3.5–5.2)
Anion gap: 16 — ABNORMAL HIGH (ref 5–15)
BUN: 75 mg/dL — ABNORMAL HIGH (ref 6–23)
CALCIUM: 8.4 mg/dL (ref 8.4–10.5)
CO2: 21 mEq/L (ref 19–32)
Chloride: 87 mEq/L — ABNORMAL LOW (ref 96–112)
Creatinine, Ser: 3.76 mg/dL — ABNORMAL HIGH (ref 0.50–1.10)
GFR calc Af Amer: 13 mL/min — ABNORMAL LOW (ref >90)
GFR, EST NON AFRICAN AMERICAN: 11 mL/min — AB (ref >90)
Glucose, Bld: 152 mg/dL — ABNORMAL HIGH (ref 70–99)
PHOSPHORUS: 4.1 mg/dL (ref 2.3–4.6)
Potassium: 4.4 mEq/L (ref 3.7–5.3)
Sodium: 124 mEq/L — ABNORMAL LOW (ref 137–147)

## 2014-03-30 LAB — CBC
HCT: 29.1 % — ABNORMAL LOW (ref 36.0–46.0)
Hemoglobin: 9 g/dL — ABNORMAL LOW (ref 12.0–15.0)
MCH: 27.3 pg (ref 26.0–34.0)
MCHC: 30.9 g/dL (ref 30.0–36.0)
MCV: 88.2 fL (ref 78.0–100.0)
Platelets: 189 10*3/uL (ref 150–400)
RBC: 3.3 MIL/uL — AB (ref 3.87–5.11)
RDW: 16.6 % — AB (ref 11.5–15.5)
WBC: 12 10*3/uL — ABNORMAL HIGH (ref 4.0–10.5)

## 2014-03-30 LAB — PREALBUMIN: PREALBUMIN: 15.4 mg/dL — AB (ref 17.0–34.0)

## 2014-03-30 LAB — PROTIME-INR
INR: 3.19 — ABNORMAL HIGH (ref 0.00–1.49)
PROTHROMBIN TIME: 32.7 s — AB (ref 11.6–15.2)

## 2014-03-31 LAB — PROTIME-INR
INR: 2.96 — ABNORMAL HIGH (ref 0.00–1.49)
Prothrombin Time: 30.8 seconds — ABNORMAL HIGH (ref 11.6–15.2)

## 2014-04-01 LAB — RENAL FUNCTION PANEL
Albumin: 2.3 g/dL — ABNORMAL LOW (ref 3.5–5.2)
Anion gap: 16 — ABNORMAL HIGH (ref 5–15)
BUN: 56 mg/dL — ABNORMAL HIGH (ref 6–23)
CALCIUM: 8.4 mg/dL (ref 8.4–10.5)
CO2: 24 meq/L (ref 19–32)
CREATININE: 3.08 mg/dL — AB (ref 0.50–1.10)
Chloride: 93 mEq/L — ABNORMAL LOW (ref 96–112)
GFR calc Af Amer: 16 mL/min — ABNORMAL LOW (ref >90)
GFR calc non Af Amer: 14 mL/min — ABNORMAL LOW (ref >90)
GLUCOSE: 158 mg/dL — AB (ref 70–99)
PHOSPHORUS: 3.6 mg/dL (ref 2.3–4.6)
Potassium: 3.5 mEq/L — ABNORMAL LOW (ref 3.7–5.3)
Sodium: 133 mEq/L — ABNORMAL LOW (ref 137–147)

## 2014-04-01 LAB — CBC
HEMATOCRIT: 27.8 % — AB (ref 36.0–46.0)
HEMOGLOBIN: 8.5 g/dL — AB (ref 12.0–15.0)
MCH: 27.1 pg (ref 26.0–34.0)
MCHC: 30.6 g/dL (ref 30.0–36.0)
MCV: 88.5 fL (ref 78.0–100.0)
Platelets: 229 10*3/uL (ref 150–400)
RBC: 3.14 MIL/uL — ABNORMAL LOW (ref 3.87–5.11)
RDW: 16.5 % — ABNORMAL HIGH (ref 11.5–15.5)
WBC: 11.4 10*3/uL — ABNORMAL HIGH (ref 4.0–10.5)

## 2014-04-01 LAB — PROTIME-INR
INR: 2.61 — ABNORMAL HIGH (ref 0.00–1.49)
INR: 2.88 — AB (ref 0.00–1.49)
PROTHROMBIN TIME: 27.9 s — AB (ref 11.6–15.2)
Prothrombin Time: 30.2 seconds — ABNORMAL HIGH (ref 11.6–15.2)

## 2014-04-02 DIAGNOSIS — Z01810 Encounter for preprocedural cardiovascular examination: Secondary | ICD-10-CM

## 2014-04-02 DIAGNOSIS — N19 Unspecified kidney failure: Secondary | ICD-10-CM

## 2014-04-02 LAB — RENAL FUNCTION PANEL
ANION GAP: 11 (ref 5–15)
Albumin: 2.4 g/dL — ABNORMAL LOW (ref 3.5–5.2)
BUN: 29 mg/dL — ABNORMAL HIGH (ref 6–23)
CALCIUM: 8.5 mg/dL (ref 8.4–10.5)
CO2: 28 meq/L (ref 19–32)
Chloride: 92 mEq/L — ABNORMAL LOW (ref 96–112)
Creatinine, Ser: 2.07 mg/dL — ABNORMAL HIGH (ref 0.50–1.10)
GFR calc Af Amer: 26 mL/min — ABNORMAL LOW (ref >90)
GFR, EST NON AFRICAN AMERICAN: 22 mL/min — AB (ref >90)
GLUCOSE: 185 mg/dL — AB (ref 70–99)
PHOSPHORUS: 2.9 mg/dL (ref 2.3–4.6)
POTASSIUM: 3.8 meq/L (ref 3.7–5.3)
SODIUM: 131 meq/L — AB (ref 137–147)

## 2014-04-02 LAB — HEPATITIS B SURFACE ANTIGEN: Hepatitis B Surface Ag: NEGATIVE

## 2014-04-02 LAB — CBC
HCT: 29.5 % — ABNORMAL LOW (ref 36.0–46.0)
Hemoglobin: 8.8 g/dL — ABNORMAL LOW (ref 12.0–15.0)
MCH: 26.3 pg (ref 26.0–34.0)
MCHC: 29.8 g/dL — ABNORMAL LOW (ref 30.0–36.0)
MCV: 88.3 fL (ref 78.0–100.0)
PLATELETS: 231 10*3/uL (ref 150–400)
RBC: 3.34 MIL/uL — ABNORMAL LOW (ref 3.87–5.11)
RDW: 16.3 % — ABNORMAL HIGH (ref 11.5–15.5)
WBC: 10.9 10*3/uL — ABNORMAL HIGH (ref 4.0–10.5)

## 2014-04-02 LAB — PROTIME-INR
INR: 2.5 — ABNORMAL HIGH (ref 0.00–1.49)
PROTHROMBIN TIME: 27 s — AB (ref 11.6–15.2)

## 2014-04-02 NOTE — Progress Notes (Signed)
Right  Upper Extremity Vein Map    Cephalic  Segment Diameter Depth Comment  1. Axilla 3.48mm mm   2. Mid upper arm 3.73mm mm   3. Above AC 3.41mm mm branch  4. In AC 4.48mm mm   5. Below AC 2.42mm mm Multiple branches  6. Mid forearm 61mm mm   7. Wrist 4mm mm    mm mm    mm mm    mm mm    Basilic  Segment Diameter Depth Comment  1. Axilla 3.53mm 71mm   2. Mid upper arm 4.27mm 77mm   3. Above AC 3.34mm 54mm   4. In AC 4.68mm 10mm   5. Below AC 71mm 81mm   6. Mid forearm 2.59mm 18mm   7. Wrist 1.39mm 1mm    mm mm    mm mm    mm mm   Left Upper Extremity Vein Map    Cephalic  Segment Diameter Depth Comment  1. Axilla 2.11mm mm   2. Mid upper arm 2.7mm mm   3. Above AC 2.1mm mm   4. In AC 6.74mm mm   5. Below AC 2.13mm mm   6. Mid forearm 3.64mm mm   7. Wrist mm mm Too small   mm mm    mm mm    mm mm    Basilic  Segment Diameter Depth Comment  1. Axilla 2.6mm 23mm   2. Mid upper arm 3.70mm 38mm   3. Above AC 3.4mm 50mm   4. In AC 3.47mm 64mm   5. Below AC 2.47mm 38mm   6. Mid forearm 2.64mm 67mm   7. Wrist 2.45mm 24mm    mm mm    mm mm    mm mm

## 2014-04-03 LAB — PROTIME-INR
INR: 2.38 — ABNORMAL HIGH (ref 0.00–1.49)
Prothrombin Time: 26 s — ABNORMAL HIGH (ref 11.6–15.2)

## 2014-04-04 LAB — BASIC METABOLIC PANEL
Anion gap: 13 (ref 5–15)
BUN: 25 mg/dL — AB (ref 6–23)
CO2: 28 meq/L (ref 19–32)
Calcium: 8.4 mg/dL (ref 8.4–10.5)
Chloride: 91 mEq/L — ABNORMAL LOW (ref 96–112)
Creatinine, Ser: 1.97 mg/dL — ABNORMAL HIGH (ref 0.50–1.10)
GFR calc Af Amer: 27 mL/min — ABNORMAL LOW (ref >90)
GFR, EST NON AFRICAN AMERICAN: 24 mL/min — AB (ref >90)
GLUCOSE: 155 mg/dL — AB (ref 70–99)
POTASSIUM: 3.4 meq/L — AB (ref 3.7–5.3)
SODIUM: 132 meq/L — AB (ref 137–147)

## 2014-04-04 LAB — PROTIME-INR
INR: 2.04 — ABNORMAL HIGH (ref 0.00–1.49)
Prothrombin Time: 23 seconds — ABNORMAL HIGH (ref 11.6–15.2)

## 2014-04-05 LAB — PROTIME-INR
INR: 1.87 — AB (ref 0.00–1.49)
PROTHROMBIN TIME: 21.5 s — AB (ref 11.6–15.2)

## 2014-04-06 DIAGNOSIS — I2510 Atherosclerotic heart disease of native coronary artery without angina pectoris: Secondary | ICD-10-CM

## 2014-04-06 LAB — RENAL FUNCTION PANEL
ALBUMIN: 2.4 g/dL — AB (ref 3.5–5.2)
ANION GAP: 15 (ref 5–15)
BUN: 81 mg/dL — ABNORMAL HIGH (ref 6–23)
CHLORIDE: 86 meq/L — AB (ref 96–112)
CO2: 27 mEq/L (ref 19–32)
CREATININE: 3.83 mg/dL — AB (ref 0.50–1.10)
Calcium: 8.6 mg/dL (ref 8.4–10.5)
GFR calc Af Amer: 12 mL/min — ABNORMAL LOW (ref >90)
GFR calc non Af Amer: 11 mL/min — ABNORMAL LOW (ref >90)
Glucose, Bld: 135 mg/dL — ABNORMAL HIGH (ref 70–99)
Phosphorus: 4.5 mg/dL (ref 2.3–4.6)
Potassium: 3.7 mEq/L (ref 3.7–5.3)
Sodium: 128 mEq/L — ABNORMAL LOW (ref 137–147)

## 2014-04-06 LAB — CBC
HCT: 27.7 % — ABNORMAL LOW (ref 36.0–46.0)
Hemoglobin: 8.6 g/dL — ABNORMAL LOW (ref 12.0–15.0)
MCH: 27 pg (ref 26.0–34.0)
MCHC: 31 g/dL (ref 30.0–36.0)
MCV: 86.8 fL (ref 78.0–100.0)
PLATELETS: 272 10*3/uL (ref 150–400)
RBC: 3.19 MIL/uL — AB (ref 3.87–5.11)
RDW: 16.8 % — ABNORMAL HIGH (ref 11.5–15.5)
WBC: 13.6 10*3/uL — AB (ref 4.0–10.5)

## 2014-04-06 LAB — URINALYSIS, ROUTINE W REFLEX MICROSCOPIC
GLUCOSE, UA: NEGATIVE mg/dL
KETONES UR: NEGATIVE mg/dL
Nitrite: NEGATIVE
PROTEIN: 100 mg/dL — AB
Specific Gravity, Urine: 1.019 (ref 1.005–1.030)
Urobilinogen, UA: 0.2 mg/dL (ref 0.0–1.0)
pH: 8 (ref 5.0–8.0)

## 2014-04-06 LAB — URINE MICROSCOPIC-ADD ON

## 2014-04-06 LAB — PROTIME-INR
INR: 1.75 — AB (ref 0.00–1.49)
Prothrombin Time: 20.4 seconds — ABNORMAL HIGH (ref 11.6–15.2)

## 2014-04-06 NOTE — Progress Notes (Signed)
Patient ID: [name], female   DOB: [birth year], 73 y.o.   MRN: [id number] Advanced Heart Failure Rounding Note   Subjective:    On HD. Remains confused. Denies CP/SOB  Remains in NSR.    Objective:      VSS  Intake/Output:  No intake or output data in the 24 hours ending 04/06/14 1211   Physical Exam:  General:  Chronically ill appearing. No resp difficulty, confused HEENT: normal; Neck: supple. JVP 8-9 Carotids 2+ bilat; no bruits. No lymphadenopathy or thryomegaly appreciated. Cor: PMI nondisplaced. Regular rate & irregular rhythm. No rubs, gallops or murmurs. R chest hickman Lungs: Diminished in the bases Abdomen: soft, nontender, nondistended. No hepatosplenomegaly. No bruits or masses. Good bowel sounds. Extremities: no cyanosis, clubbing, rash, n1+ edema;  Neuro: alert. Working with [employer] all 4 extremities w/o difficulty. Affect flat   Telemetry: SR 80s Labs: Basic Metabolic Panel:  Recent Labs Lab 04/01/14 0440 04/02/14 0815 04/04/14 0510 04/06/14 0820  NA 133* 131* 132* 128*  K 3.5* 3.8 3.4* 3.7  CL 93* 92* 91* 86*  CO2 24 28 28 27   GLUCOSE 158* 185* 155* 135*  BUN 56* 29* 25* 81*  CREATININE 3.08* 2.07* 1.97* 3.83*  CALCIUM 8.4 8.5 8.4 8.6  PHOS 3.6 2.9  --  4.5    Liver Function Tests:  Recent Labs Lab 04/01/14 0440 04/02/14 0815 04/06/14 0820  ALBUMIN 2.3* 2.4* 2.4*   No results found for this basename: LIPASE, AMYLASE,  in the last 168 hours No results found for this basename: AMMONIA,  in the last 168 hours  CBC:  Recent Labs Lab 04/01/14 0440 04/02/14 0815 04/06/14 0820  WBC 11.4* 10.9* 13.6*  HGB 8.5* 8.8* 8.6*  HCT 27.8* 29.5* 27.7*  MCV 88.5 88.3 86.8  PLT 229 231 272    Cardiac Enzymes: No results found for this basename: CKTOTAL, CKMB, CKMBINDEX, TROPONINI,  in the last 168 hours  BNP: BNP (last 3 results)  Recent Labs  03/16/14 0525 03/23/14 0500  PROBNP 29632.0* 27500.0*      Assessment:   1) CAD -  s/p CABG x 4 with MV replacement 2) AKI - On HD 3) Delerium/dementia   Plan/Discussion:    She is stable from a cardiac perspective. Unfortunately she remains very confused. Suspect she has significant dementia. Is now HD dependent.   Pending placement. No further recs at this time. We will sign off. Please call with questions.    Arvilla Meres, MD  04/06/2014

## 2014-04-07 ENCOUNTER — Other Ambulatory Visit: Payer: Self-pay | Admitting: *Deleted

## 2014-04-07 LAB — PROTIME-INR
INR: 2.02 — AB (ref 0.00–1.49)
PROTHROMBIN TIME: 22.9 s — AB (ref 11.6–15.2)

## 2014-04-07 NOTE — Consult Note (Addendum)
[facility] [place] HISTORY AND PHYSICAL   History of Present Illness:  Patient is a 73 y.o. year old female who presents for placement of a permanent hemodialysis access.  The patient is currently on hemodialysis.  The cause of renal failure is thought to be secondary to multifactorial.  She has had an extended stay in [place] after emergent CABG mitral valve.  She is on coumadin for Afib.  Other chronic medical problems include dementia/confusion, hypertension, diabetes, hyperlipidemia all of which are currently stable.  Past Medical History  Diagnosis Date  . History of MI (myocardial infarction)     PCI done @ [facility] (cannot get cath report on Care Everywhere(  . Essential hypertension   . TIA (transient ischemic attack)   . Thyroid disease   . Anxiety   . Diabetes mellitus type 2 with peripheral artery disease     On insulin  . PAD (peripheral artery disease)   . Hyperlipidemia associated with type 2 diabetes mellitus   . A-fib   . ARF (acute renal failure)     Past Surgical History  Procedure Laterality Date  . Cardiac catheterization      PCI - unknown vessel or stent; unknown date; @ [facility]  . Abdominal hysterectomy    . Cholecystectomy    . Coronary artery bypass graft N/A 01/25/2014    Procedure: CORONARY ARTERY BYPASS GRAFTING TIMES FOUR USING LEFT INTERNAL MAMMARY ARTERY TO LAD, SAPHENOUS VEIN GRAFTS TO OM1, OM2, AND PDA;  Surgeon: Kerin Perna, MD;  Location: [facility] OR;  Service: Open Heart Surgery;  Laterality: N/A;  . Mitral valve replacement N/A 01/25/2014    Procedure: MITRAL VALVE (MV) REPLACEMENT;  Surgeon: Kerin Perna, MD;  Location: [facility] OR;  Service: Open Heart Surgery;  Laterality: N/A;     Social History History  Substance Use Topics  . Smoking status: Never Smoker   . Smokeless tobacco: Never Used  . Alcohol Use: No    Family History Family History  Problem Relation Age of Onset  . Heart attack Mother   . Heart  disease Mother   . Heart attack Father   . Heart disease Father     Allergies  No Known Allergies   Current Facility-Administered Medications  Medication Dose Route Frequency Provider Last Rate Last Dose  . 0.9 %  sodium chloride infusion   Intravenous Once Robet Leu, PA-C        ROS:   Unable to obtain pt confused  Physical Examination  Filed Vitals:   03/13/14 1521 03/13/14 1525 03/13/14 1529 03/13/14 1532  BP: 111/61 122/67 122/68 141/79  Pulse: 82 80 84 87  Resp: SpO2: 98% 98% 97% 99%    There is no weight on file to calculate BMI.  General:  Alert and oriented, no acute distress HEENT: Normal Pulmonary: Clear to auscultation bilaterally Gastrointestinal: Soft, non-tender, non-distended Extremity Pulses:  1+ radial, 2+ brachial pulses bilaterally Musculoskeletal: No deformity or edema upper extremities  DATA: vein map right upper arm basilic and cephalic vein greater than 3 mm   ASSESSMENT: Needs long term HD access.  Probably has reasonable vein for right arm fistula.  Will need coumadin held for procedure.   PLAN:1.  Please hold coumadin. 2.  NPO p midnight  3.  Consent  4.  Will recheck INR CBC BMET in am  5. Right arm AV graft vs fistula tomorrow if INR reasonable.  Fabienne Bruns, MD  [facility] [place] Office: [phone] Pager: [phone]  Spoke with Nurse.  So far unable to get consent from family.  Pt also received coumadin yesterday and INR >2.  Her dialysis day is MWF.  Will try to put on schedule for Thursday instead of tomorrow.  Will need to be NPO p midnight Wednesday for Thursday operation.  Hopefully INR and consent issues resolved by then.  Fabienne Bruns, MD [facility] [place] Office: [phone] Pager: [phone]

## 2014-04-08 ENCOUNTER — Encounter (HOSPITAL_COMMUNITY): Payer: Self-pay | Admitting: Pharmacy Technician

## 2014-04-08 DIAGNOSIS — N185 Chronic kidney disease, stage 5: Secondary | ICD-10-CM

## 2014-04-08 LAB — URINE CULTURE
Colony Count: NO GROWTH
Culture: NO GROWTH
Special Requests: NORMAL

## 2014-04-08 LAB — BASIC METABOLIC PANEL
Anion gap: 14 (ref 5–15)
BUN: 75 mg/dL — ABNORMAL HIGH (ref 6–23)
CO2: 27 mEq/L (ref 19–32)
Calcium: 8.2 mg/dL — ABNORMAL LOW (ref 8.4–10.5)
Chloride: 86 mEq/L — ABNORMAL LOW (ref 96–112)
Creatinine, Ser: 3.19 mg/dL — ABNORMAL HIGH (ref 0.50–1.10)
GFR, EST AFRICAN AMERICAN: 15 mL/min — AB (ref >90)
GFR, EST NON AFRICAN AMERICAN: 13 mL/min — AB (ref >90)
GLUCOSE: 162 mg/dL — AB (ref 70–99)
Potassium: 3.9 mEq/L (ref 3.7–5.3)
SODIUM: 127 meq/L — AB (ref 137–147)

## 2014-04-08 LAB — CBC
HCT: 27.9 % — ABNORMAL LOW (ref 36.0–46.0)
HEMOGLOBIN: 8.7 g/dL — AB (ref 12.0–15.0)
MCH: 27.2 pg (ref 26.0–34.0)
MCHC: 31.2 g/dL (ref 30.0–36.0)
MCV: 87.2 fL (ref 78.0–100.0)
Platelets: 269 10*3/uL (ref 150–400)
RBC: 3.2 MIL/uL — ABNORMAL LOW (ref 3.87–5.11)
RDW: 17.1 % — AB (ref 11.5–15.5)
WBC: 13.3 10*3/uL — ABNORMAL HIGH (ref 4.0–10.5)

## 2014-04-08 LAB — ALBUMIN: ALBUMIN: 2.4 g/dL — AB (ref 3.5–5.2)

## 2014-04-08 LAB — PROTIME-INR
INR: 2.23 — ABNORMAL HIGH (ref 0.00–1.49)
PROTHROMBIN TIME: 24.7 s — AB (ref 11.6–15.2)

## 2014-04-08 LAB — PHOSPHORUS: Phosphorus: 4.9 mg/dL — ABNORMAL HIGH (ref 2.3–4.6)

## 2014-04-08 NOTE — Consult Note (Addendum)
INR continues to rise.  2.2 today.  Will recheck tomorrow morning.  If still elevated may need to reschedule. Please continue to hold coumadin.  We do now have consent from family.  Fabienne Bruns, MD [facility] [place] Office: [phone] Pager: [phone]

## 2014-04-09 ENCOUNTER — Other Ambulatory Visit: Payer: Self-pay | Admitting: *Deleted

## 2014-04-09 ENCOUNTER — Encounter (HOSPITAL_COMMUNITY): Payer: Self-pay | Admitting: Anesthesiology

## 2014-04-09 ENCOUNTER — Encounter
Admission: AD | Disposition: A | Discharge status: Skilled Nursing Facility | Payer: Self-pay | Source: Ambulatory Visit | Attending: Internal Medicine

## 2014-04-09 DIAGNOSIS — N186 End stage renal disease: Secondary | ICD-10-CM

## 2014-04-09 DIAGNOSIS — Z4931 Encounter for adequacy testing for hemodialysis: Secondary | ICD-10-CM

## 2014-04-09 HISTORY — PX: AV FISTULA PLACEMENT: SHX1204

## 2014-04-09 LAB — BASIC METABOLIC PANEL
ANION GAP: 11 (ref 5–15)
BUN: 42 mg/dL — ABNORMAL HIGH (ref 6–23)
CHLORIDE: 91 meq/L — AB (ref 96–112)
CO2: 28 mEq/L (ref 19–32)
CREATININE: 2.23 mg/dL — AB (ref 0.50–1.10)
Calcium: 8.3 mg/dL — ABNORMAL LOW (ref 8.4–10.5)
GFR calc non Af Amer: 20 mL/min — ABNORMAL LOW (ref >90)
GFR, EST AFRICAN AMERICAN: 24 mL/min — AB (ref >90)
Glucose, Bld: 145 mg/dL — ABNORMAL HIGH (ref 70–99)
Potassium: 3.6 mEq/L — ABNORMAL LOW (ref 3.7–5.3)
SODIUM: 130 meq/L — AB (ref 137–147)

## 2014-04-09 LAB — CBC
HCT: 27.1 % — ABNORMAL LOW (ref 36.0–46.0)
Hemoglobin: 8.3 g/dL — ABNORMAL LOW (ref 12.0–15.0)
MCH: 26.9 pg (ref 26.0–34.0)
MCHC: 30.6 g/dL (ref 30.0–36.0)
MCV: 88 fL (ref 78.0–100.0)
PLATELETS: 251 10*3/uL (ref 150–400)
RBC: 3.08 MIL/uL — ABNORMAL LOW (ref 3.87–5.11)
RDW: 17.1 % — ABNORMAL HIGH (ref 11.5–15.5)
WBC: 11.8 10*3/uL — AB (ref 4.0–10.5)

## 2014-04-09 LAB — PROTIME-INR
INR: 1.79 — ABNORMAL HIGH (ref 0.00–1.49)
PROTHROMBIN TIME: 20.8 s — AB (ref 11.6–15.2)

## 2014-04-09 LAB — GLUCOSE, CAPILLARY
GLUCOSE-CAPILLARY: 158 mg/dL — AB (ref 70–99)
Glucose-Capillary: 151 mg/dL — ABNORMAL HIGH (ref 70–99)

## 2014-04-09 SURGERY — ARTERIOVENOUS (AV) FISTULA CREATION
Anesthesia: Monitor Anesthesia Care | Site: Arm Upper | Laterality: Right

## 2014-04-09 MED ORDER — PROTAMINE SULFATE 10 MG/ML IV SOLN
INTRAVENOUS | Status: DC | PRN
  Administered 2014-04-09: 20 mg via INTRAVENOUS
  Administered 2014-04-09 (×2): 10 mg via INTRAVENOUS

## 2014-04-09 MED ORDER — SODIUM CHLORIDE 0.9 % IR SOLN
Status: DC | PRN
  Administered 2014-04-09: 13:00:00

## 2014-04-09 MED ORDER — LIDOCAINE HCL (CARDIAC) 20 MG/ML IV SOLN
INTRAVENOUS | Status: AC
  Filled 2014-04-09: qty 5

## 2014-04-09 MED ORDER — SODIUM CHLORIDE 0.9 % IV SOLN
INTRAVENOUS | Status: DC | PRN
  Administered 2014-04-09: 12:00:00 via INTRAVENOUS

## 2014-04-09 MED ORDER — FENTANYL CITRATE 0.05 MG/ML IJ SOLN
INTRAMUSCULAR | Status: DC | PRN
  Administered 2014-04-09: 100 ug via INTRAVENOUS

## 2014-04-09 MED ORDER — SODIUM CHLORIDE 0.9 % IV SOLN: INTRAVENOUS | Status: DC

## 2014-04-09 MED ORDER — PROPOFOL INFUSION 10 MG/ML OPTIME
INTRAVENOUS | Status: DC | PRN
  Administered 2014-04-09: 25 ug/kg/min via INTRAVENOUS

## 2014-04-09 MED ORDER — PROTAMINE SULFATE 10 MG/ML IV SOLN
INTRAVENOUS | Status: AC
  Filled 2014-04-09: qty 5

## 2014-04-09 MED ORDER — HEPARIN SODIUM (PORCINE) 1000 UNIT/ML IJ SOLN
INTRAMUSCULAR | Status: DC | PRN
  Administered 2014-04-09: 7000 [IU] via INTRAVENOUS

## 2014-04-09 MED ORDER — FENTANYL CITRATE 0.05 MG/ML IJ SOLN
INTRAMUSCULAR | Status: AC
  Filled 2014-04-09: qty 5

## 2014-04-09 MED ORDER — LIDOCAINE HCL (CARDIAC) 20 MG/ML IV SOLN
INTRAVENOUS | Status: DC | PRN
Start: 2014-04-09 — End: 2014-04-09
  Administered 2014-04-09: 30 mg via INTRAVENOUS

## 2014-04-09 MED ORDER — FENTANYL CITRATE 0.05 MG/ML IJ SOLN: 25.0000 ug | INTRAMUSCULAR | Status: DC | PRN

## 2014-04-09 MED ORDER — 0.9 % SODIUM CHLORIDE (POUR BTL) OPTIME
TOPICAL | Status: DC | PRN
  Administered 2014-04-09: 1000 mL

## 2014-04-09 MED ORDER — LIDOCAINE HCL (PF) 1 % IJ SOLN
INTRAMUSCULAR | Status: DC | PRN
  Administered 2014-04-09: 30 mL

## 2014-04-09 MED ORDER — CEFAZOLIN SODIUM-DEXTROSE 2-3 GM-% IV SOLR
INTRAVENOUS | Status: DC | PRN
  Administered 2014-04-09: 2 g via INTRAVENOUS

## 2014-04-09 MED ORDER — LIDOCAINE HCL (PF) 1 % IJ SOLN
INTRAMUSCULAR | Status: AC
  Filled 2014-04-09: qty 30

## 2014-04-09 SURGICAL SUPPLY — 41 items
ARMBAND PINK RESTRICT EXTREMIT (MISCELLANEOUS) ×3 IMPLANT
BLADE SURG 10 STRL SS (BLADE) ×3 IMPLANT
CANISTER SUCTION 2500CC (MISCELLANEOUS) ×3 IMPLANT
CANNULA VESSEL 3MM 2 BLNT TIP (CANNULA) ×3 IMPLANT
CLIP TI MEDIUM 6 (CLIP) ×3 IMPLANT
CLIP TI WIDE RED SMALL 6 (CLIP) ×6 IMPLANT
COVER PROBE W GEL 5X96 (DRAPES) ×3 IMPLANT
COVER SURGICAL LIGHT HANDLE (MISCELLANEOUS) ×3 IMPLANT
DECANTER SPIKE VIAL GLASS SM (MISCELLANEOUS) ×3 IMPLANT
DERMABOND ADVANCED (GAUZE/BANDAGES/DRESSINGS) ×2
DERMABOND ADVANCED .7 DNX12 (GAUZE/BANDAGES/DRESSINGS) ×1 IMPLANT
DRAIN PENROSE 1/2X12 LTX STRL (WOUND CARE) IMPLANT
ELECT REM PT RETURN 9FT ADLT (ELECTROSURGICAL) ×3
ELECTRODE REM PT RTRN 9FT ADLT (ELECTROSURGICAL) ×1 IMPLANT
GLOVE BIO SURGEON STRL SZ 6.5 (GLOVE) ×2 IMPLANT
GLOVE BIO SURGEON STRL SZ7.5 (GLOVE) ×3 IMPLANT
GLOVE BIO SURGEONS STRL SZ 6.5 (GLOVE) ×1
GLOVE BIOGEL PI IND STRL 6.5 (GLOVE) ×4 IMPLANT
GLOVE BIOGEL PI IND STRL 7.5 (GLOVE) ×1 IMPLANT
GLOVE BIOGEL PI IND STRL 8 (GLOVE) ×1 IMPLANT
GLOVE BIOGEL PI INDICATOR 6.5 (GLOVE) ×8
GLOVE BIOGEL PI INDICATOR 7.5 (GLOVE) ×2
GLOVE BIOGEL PI INDICATOR 8 (GLOVE) ×2
GLOVE ECLIPSE 6.5 STRL STRAW (GLOVE) ×3 IMPLANT
GLOVE SS BIOGEL STRL SZ 7 (GLOVE) ×1 IMPLANT
GLOVE SUPERSENSE BIOGEL SZ 7 (GLOVE) ×2
GOWN STRL REUS W/ TWL LRG LVL3 (GOWN DISPOSABLE) ×3 IMPLANT
GOWN STRL REUS W/TWL LRG LVL3 (GOWN DISPOSABLE) ×6
KIT BASIN OR (CUSTOM PROCEDURE TRAY) ×3 IMPLANT
KIT ROOM TURNOVER OR (KITS) ×3 IMPLANT
NS IRRIG 1000ML POUR BTL (IV SOLUTION) ×3 IMPLANT
PACK CV ACCESS (CUSTOM PROCEDURE TRAY) ×3 IMPLANT
PAD ARMBOARD 7.5X6 YLW CONV (MISCELLANEOUS) ×6 IMPLANT
PROBE PENCIL 8 MHZ STRL DISP (MISCELLANEOUS) ×3 IMPLANT
SPONGE SURGIFOAM ABS GEL 100 (HEMOSTASIS) IMPLANT
SUT PROLENE 6 0 BV (SUTURE) ×6 IMPLANT
SUT VIC AB 3-0 SH 27 (SUTURE) ×2
SUT VIC AB 3-0 SH 27X BRD (SUTURE) ×1 IMPLANT
SUT VICRYL 4-0 PS2 18IN ABS (SUTURE) ×3 IMPLANT
UNDERPAD 30X30 INCONTINENT (UNDERPADS AND DIAPERS) ×3 IMPLANT
WATER STERILE IRR 1000ML POUR (IV SOLUTION) ×3 IMPLANT

## 2014-04-09 NOTE — Anesthesia Preprocedure Evaluation (Addendum)
Anesthesia Evaluation  Patient identified by MRN, date of birth, ID band Patient awake    Reviewed: Allergy & Precautions, H&P , NPO status   Airway Mallampati: II      Dental   Pulmonary neg pulmonary ROS,          Cardiovascular hypertension, + CAD, + Past MI and + Peripheral Vascular Disease     Neuro/Psych    GI/Hepatic negative GI ROS, Neg liver ROS,   Endo/Other  diabetes  Renal/GU Renal disease     Musculoskeletal   Abdominal   Peds  Hematology   Anesthesia Other Findings   Reproductive/Obstetrics                         Anesthesia Physical Anesthesia Plan  ASA: III  Anesthesia Plan: MAC   Post-op Pain Management:    Induction: Intravenous  Airway Management Planned: Simple Face Mask  Additional Equipment:   Intra-op Plan:   Post-operative Plan:   Informed Consent: I have reviewed the patients History and Physical, chart, labs and discussed the procedure including the risks, benefits and alternatives for the proposed anesthesia with the patient or authorized representative who has indicated his/her understanding and acceptance.   Dental advisory given  Plan Discussed with: CRNA and Anesthesiologist  Anesthesia Plan Comments:         Anesthesia Quick Evaluation

## 2014-04-09 NOTE — Transfer of Care (Signed)
Immediate Anesthesia Transfer of Care Note  Patient: [name]  Procedure(s) Performed: Procedure(s): ARTERIOVENOUS (AV) FISTULA CREATION (Right)  Patient Location: PACU  Anesthesia Type:MAC  Level of Consciousness: awake, alert  and oriented  Airway & Oxygen Therapy: Patient Spontanous Breathing  Post-op Assessment: Report given to PACU RN and Post -op Vital signs reviewed and stable  Post vital signs: Reviewed and stable  Complications: No apparent anesthesia complications

## 2014-04-09 NOTE — H&P (View-Only) (Signed)
INR continues to rise.  2.2 today.  Will recheck tomorrow morning.  If still elevated may need to reschedule. Please continue to hold coumadin.  We do now have consent from family.  Shery Wauneka, MD [facility] [place] Office: [phone] Pager: [phone]  

## 2014-04-09 NOTE — Op Note (Addendum)
    NAME: [name]   MRN: [id number] DOB: [birth year]    DATE OF OPERATION: 04/09/2014  PREOP DIAGNOSIS: Stage V chronic kidney disease  POSTOP DIAGNOSIS: Same  PROCEDURE: Right brachiocephalic AV fistula  SURGEON: Di Kindle. Edilia Bo, MD, FACS  ASSIST: Lianne Cure PA  ANESTHESIA: Local with sedation   EBL: Minimal  INDICATIONS: [name] is a 73 y.o. female who presents for new access. She has a functioning dialysis catheter.  FINDINGS: a 4 mm cephalic vein. 4 mm brachial artery.  TECHNIQUE: Patient was taken to the operating room and received sedation. The right upper extremity was prepped and draped in the usual sterile fashion. After the skin was anesthetized with 1 lidocaine, a transverse incision was made just above the antecubital level. Here the cephalic vein was dissected free and ligated distally. Branches were divided between clips and 3-0 silk ties. The brachial artery was dissected free beneath the fascia. The patient was heparinized. The brachial artery was clamped proximally and distally and a longitudinal arteriotomy was made. The vein was mobilized over and sewn end-to-side to the artery using continuous 6-0 Prolene suture. At the completion was a good thrill in the fistula and a radial signal with the Doppler. The heparin was partially reversed with protamine. The wounds closed the deep layer 3-0 Vicryl and the skin closed with 4-0 Vicryl. Dermabond was applied. The patient tolerated the procedure well and was transferred to the recovery room in stable condition. All needle and sponge counts were correct.  Waverly Ferrari, MD, FACS [facility]  DATE OF DICTATION:   04/09/2014

## 2014-04-09 NOTE — Anesthesia Postprocedure Evaluation (Signed)
  Anesthesia Post-op Note  Patient: [name]  Procedure(s) Performed: Procedure(s): ARTERIOVENOUS (AV) FISTULA CREATION (Right)  Patient Location: PACU  Anesthesia Type:MAC  Level of Consciousness: awake  Airway and Oxygen Therapy: Patient Spontanous Breathing  Post-op Pain: mild  Post-op Assessment: Post-op Vital signs reviewed  Post-op Vital Signs: Reviewed  Last Vitals:  Filed Vitals:   04/09/14 1415  BP: 136/66  Pulse: 84  Temp: 36.6 C  Resp: 20    Complications: No apparent anesthesia complications

## 2014-04-09 NOTE — Interval H&P Note (Signed)
History and Physical Interval Note:  04/09/2014 12:14 PM  [name]  has presented today for surgery, with the diagnosis of End stage renal disease  The various methods of treatment have been discussed with the patient and family. After consideration of risks, benefits and other options for treatment, the patient has consented to  Procedure(s): ARTERIOVENOUS (AV) FISTULA CREATION (Right) as a surgical intervention .  The patient's history has been reviewed, patient examined, no change in status, stable for surgery.  I have reviewed the patient's chart and labs.  Questions were answered to the patient's satisfaction.     Selig Wampole S

## 2014-04-10 ENCOUNTER — Encounter (HOSPITAL_COMMUNITY): Payer: Self-pay | Admitting: Vascular Surgery

## 2014-04-10 ENCOUNTER — Telehealth: Payer: Self-pay | Admitting: Vascular Surgery

## 2014-04-10 LAB — RENAL FUNCTION PANEL
ALBUMIN: 2.4 g/dL — AB (ref 3.5–5.2)
ANION GAP: 17 — AB (ref 5–15)
BUN: 62 mg/dL — ABNORMAL HIGH (ref 6–23)
CO2: 25 mEq/L (ref 19–32)
Calcium: 8.4 mg/dL (ref 8.4–10.5)
Chloride: 86 mEq/L — ABNORMAL LOW (ref 96–112)
Creatinine, Ser: 3.17 mg/dL — ABNORMAL HIGH (ref 0.50–1.10)
GFR calc non Af Amer: 13 mL/min — ABNORMAL LOW (ref >90)
GFR, EST AFRICAN AMERICAN: 15 mL/min — AB (ref >90)
Glucose, Bld: 194 mg/dL — ABNORMAL HIGH (ref 70–99)
POTASSIUM: 4 meq/L (ref 3.7–5.3)
Phosphorus: 5.2 mg/dL — ABNORMAL HIGH (ref 2.3–4.6)
SODIUM: 128 meq/L — AB (ref 137–147)

## 2014-04-10 LAB — HEMOGLOBIN A1C
HEMOGLOBIN A1C: 6.4 % — AB (ref <5.7)
MEAN PLASMA GLUCOSE: 137 mg/dL — AB (ref <117)

## 2014-04-10 LAB — PROTIME-INR
INR: 1.63 — ABNORMAL HIGH (ref 0.00–1.49)
PROTHROMBIN TIME: 19.3 s — AB (ref 11.6–15.2)

## 2014-04-10 NOTE — Progress Notes (Signed)
  [facility] Progress Note  04/10/2014 6:12 PM 1 Day Post-Op  Subjective:  Patient confused. Denies hand pain.    Filed Vitals:   04/09/14 1500  BP: 154/93  Pulse: 98  Temp: 99.1 F (37.3 C)  Resp: 23    Physical Exam: Incisions:  Right antecubital incision c/d/i.  Extremities:  Weakly palpable thrill in right arm AV fistula. Audible bruit. Non palpable radial pulse. Sensation intact. Not following directions with hand grip. Hand is pink and well perfused.   CBC    Component Value Date/Time   WBC 11.8* 04/09/2014 0634   RBC 3.08* 04/09/2014 0634   HGB 8.3* 04/09/2014 0634   HCT 27.1* 04/09/2014 0634   PLT 251 04/09/2014 0634   MCV 88.0 04/09/2014 0634   MCH 26.9 04/09/2014 0634   MCHC 30.6 04/09/2014 0634   RDW 17.1* 04/09/2014 0634   LYMPHSABS 4.1* 03/29/2014 1050   MONOABS 0.9 03/29/2014 1050   EOSABS 0.4 03/29/2014 1050   BASOSABS 0.0 03/29/2014 1050    BMET    Component Value Date/Time   NA 128* 04/10/2014 1000   K 4.0 04/10/2014 1000   CL 86* 04/10/2014 1000   CO2 25 04/10/2014 1000   GLUCOSE 194* 04/10/2014 1000   BUN 62* 04/10/2014 1000   CREATININE 3.17* 04/10/2014 1000   CALCIUM 8.4 04/10/2014 1000   CALCIUM 8.5 03/16/2014 0525   GFRNONAA 13* 04/10/2014 1000   GFRAA 15* 04/10/2014 1000    INR    Component Value Date/Time   INR 1.63* 04/10/2014 0710    No intake or output data in the 24 hours ending 04/10/14 1812   Assessment:  73 y.o. female is s/p: right brachiocephalic AV fistula   1 Day Post-Op  Plan: -The fistula is patent.  -Motor and sensation intact. No evidence of steal syndrome.  -Will sign off. Call as needed.  -Follow up in 6 weeks with Dr. Edilia Bo with duplex.    Maris Berger, PA-C [facility] Office: [phone] Pager: [phone] 04/10/2014 6:12 PM

## 2014-04-10 NOTE — Telephone Encounter (Addendum)
Message copied by Fredrich Birks on Fri Apr 10, 2014  9:29 AM ------      Message from: Lorin Mercy K      Created: Thu Apr 09, 2014  3:12 PM      Regarding: Schedule                   ----- Message -----         From: Chuck Hint, MD         Sent: 04/09/2014   1:59 PM           To: Vvs Charge Pool      Subject: charge and f/u                                           PROCEDURE: Right brachiocephalic AV fistula            SURGEON: Di Kindle. Edilia Bo, MD, FACS            Asst. Was Lianne Cure PA            She will need a follow up visit in 6 weeks with a duplex of the fistula at that time. Thank you. CD ------  04/10/14: lm at home # for patient to call Annabelle Harman at [phone]- vm stated "[name]" - he is not listed on her emerg. Contact or DPR- so I did not leave any detailed information, dpm

## 2014-04-11 LAB — CBC WITH DIFFERENTIAL/PLATELET
BASOS ABS: 0.1 10*3/uL (ref 0.0–0.1)
Basophils Relative: 1 % (ref 0–1)
EOS PCT: 1 % (ref 0–5)
Eosinophils Absolute: 0.2 10*3/uL (ref 0.0–0.7)
HCT: 28.6 % — ABNORMAL LOW (ref 36.0–46.0)
Hemoglobin: 8.6 g/dL — ABNORMAL LOW (ref 12.0–15.0)
LYMPHS ABS: 2.4 10*3/uL (ref 0.7–4.0)
Lymphocytes Relative: 22 % (ref 12–46)
MCH: 26.5 pg (ref 26.0–34.0)
MCHC: 30.1 g/dL (ref 30.0–36.0)
MCV: 88.3 fL (ref 78.0–100.0)
Monocytes Absolute: 1 10*3/uL (ref 0.1–1.0)
Monocytes Relative: 9 % (ref 3–12)
Neutro Abs: 7.5 10*3/uL (ref 1.7–7.7)
Neutrophils Relative %: 67 % (ref 43–77)
PLATELETS: 257 10*3/uL (ref 150–400)
RBC: 3.24 MIL/uL — ABNORMAL LOW (ref 3.87–5.11)
RDW: 17.4 % — AB (ref 11.5–15.5)
WBC: 11.1 10*3/uL — ABNORMAL HIGH (ref 4.0–10.5)

## 2014-04-11 LAB — BASIC METABOLIC PANEL
Anion gap: 15 (ref 5–15)
BUN: 36 mg/dL — ABNORMAL HIGH (ref 6–23)
CALCIUM: 8.2 mg/dL — AB (ref 8.4–10.5)
CO2: 27 meq/L (ref 19–32)
Chloride: 90 mEq/L — ABNORMAL LOW (ref 96–112)
Creatinine, Ser: 2.29 mg/dL — ABNORMAL HIGH (ref 0.50–1.10)
GFR calc Af Amer: 23 mL/min — ABNORMAL LOW (ref >90)
GFR calc non Af Amer: 20 mL/min — ABNORMAL LOW (ref >90)
GLUCOSE: 180 mg/dL — AB (ref 70–99)
Potassium: 3.4 mEq/L — ABNORMAL LOW (ref 3.7–5.3)
Sodium: 132 mEq/L — ABNORMAL LOW (ref 137–147)

## 2014-04-11 LAB — PROTIME-INR
INR: 1.54 — ABNORMAL HIGH (ref 0.00–1.49)
Prothrombin Time: 18.5 seconds — ABNORMAL HIGH (ref 11.6–15.2)

## 2014-04-11 LAB — MAGNESIUM: Magnesium: 2.1 mg/dL (ref 1.5–2.5)

## 2014-04-11 LAB — PHOSPHORUS: Phosphorus: 3.3 mg/dL (ref 2.3–4.6)

## 2014-04-12 LAB — BASIC METABOLIC PANEL
ANION GAP: 15 (ref 5–15)
BUN: 57 mg/dL — ABNORMAL HIGH (ref 6–23)
CHLORIDE: 91 meq/L — AB (ref 96–112)
CO2: 26 meq/L (ref 19–32)
Calcium: 8.3 mg/dL — ABNORMAL LOW (ref 8.4–10.5)
Creatinine, Ser: 3.01 mg/dL — ABNORMAL HIGH (ref 0.50–1.10)
GFR calc Af Amer: 16 mL/min — ABNORMAL LOW (ref >90)
GFR calc non Af Amer: 14 mL/min — ABNORMAL LOW (ref >90)
Glucose, Bld: 168 mg/dL — ABNORMAL HIGH (ref 70–99)
Potassium: 3.8 mEq/L (ref 3.7–5.3)
SODIUM: 132 meq/L — AB (ref 137–147)

## 2014-04-12 LAB — PROTIME-INR
INR: 1.54 — AB (ref 0.00–1.49)
Prothrombin Time: 18.5 seconds — ABNORMAL HIGH (ref 11.6–15.2)

## 2014-04-13 LAB — CBC
HCT: 28 % — ABNORMAL LOW (ref 36.0–46.0)
HEMOGLOBIN: 8.5 g/dL — AB (ref 12.0–15.0)
MCH: 26.8 pg (ref 26.0–34.0)
MCHC: 30.4 g/dL (ref 30.0–36.0)
MCV: 88.3 fL (ref 78.0–100.0)
PLATELETS: 282 10*3/uL (ref 150–400)
RBC: 3.17 MIL/uL — ABNORMAL LOW (ref 3.87–5.11)
RDW: 17.9 % — ABNORMAL HIGH (ref 11.5–15.5)
WBC: 12.8 10*3/uL — AB (ref 4.0–10.5)

## 2014-04-13 LAB — RENAL FUNCTION PANEL
Albumin: 2.3 g/dL — ABNORMAL LOW (ref 3.5–5.2)
Anion gap: 16 — ABNORMAL HIGH (ref 5–15)
BUN: 78 mg/dL — ABNORMAL HIGH (ref 6–23)
CHLORIDE: 87 meq/L — AB (ref 96–112)
CO2: 25 meq/L (ref 19–32)
Calcium: 8.3 mg/dL — ABNORMAL LOW (ref 8.4–10.5)
Creatinine, Ser: 3.53 mg/dL — ABNORMAL HIGH (ref 0.50–1.10)
GFR, EST AFRICAN AMERICAN: 14 mL/min — AB (ref >90)
GFR, EST NON AFRICAN AMERICAN: 12 mL/min — AB (ref >90)
Glucose, Bld: 147 mg/dL — ABNORMAL HIGH (ref 70–99)
Phosphorus: 5 mg/dL — ABNORMAL HIGH (ref 2.3–4.6)
Potassium: 3.7 mEq/L (ref 3.7–5.3)
SODIUM: 128 meq/L — AB (ref 137–147)

## 2014-04-13 LAB — PREALBUMIN: PREALBUMIN: 13.4 mg/dL — AB (ref 17.0–34.0)

## 2014-04-13 LAB — PROTIME-INR
INR: 1.72 — AB (ref 0.00–1.49)
PROTHROMBIN TIME: 20.2 s — AB (ref 11.6–15.2)

## 2014-04-14 ENCOUNTER — Other Ambulatory Visit (HOSPITAL_COMMUNITY): Payer: Self-pay

## 2014-04-14 LAB — CBC
HCT: 28.6 % — ABNORMAL LOW (ref 36.0–46.0)
Hemoglobin: 8.6 g/dL — ABNORMAL LOW (ref 12.0–15.0)
MCH: 26.6 pg (ref 26.0–34.0)
MCHC: 30.1 g/dL (ref 30.0–36.0)
MCV: 88.5 fL (ref 78.0–100.0)
Platelets: 290 10*3/uL (ref 150–400)
RBC: 3.23 MIL/uL — ABNORMAL LOW (ref 3.87–5.11)
RDW: 17.8 % — AB (ref 11.5–15.5)
WBC: 10.8 10*3/uL — ABNORMAL HIGH (ref 4.0–10.5)

## 2014-04-14 LAB — PROTIME-INR
INR: 1.78 — ABNORMAL HIGH (ref 0.00–1.49)
PROTHROMBIN TIME: 20.7 s — AB (ref 11.6–15.2)

## 2014-04-14 LAB — BASIC METABOLIC PANEL
Anion gap: 13 (ref 5–15)
BUN: 53 mg/dL — AB (ref 6–23)
CALCIUM: 8.4 mg/dL (ref 8.4–10.5)
CO2: 28 mEq/L (ref 19–32)
Chloride: 89 mEq/L — ABNORMAL LOW (ref 96–112)
Creatinine, Ser: 2.38 mg/dL — ABNORMAL HIGH (ref 0.50–1.10)
GFR calc Af Amer: 22 mL/min — ABNORMAL LOW (ref >90)
GFR, EST NON AFRICAN AMERICAN: 19 mL/min — AB (ref >90)
Glucose, Bld: 199 mg/dL — ABNORMAL HIGH (ref 70–99)
Potassium: 3.7 mEq/L (ref 3.7–5.3)
SODIUM: 130 meq/L — AB (ref 137–147)

## 2014-04-14 MED ORDER — CEFAZOLIN (ANCEF) 1 G IV SOLR
INTRAVENOUS | Status: AC | PRN
  Administered 2014-04-14: 2 g

## 2014-04-14 NOTE — Procedures (Signed)
Successful exchange of the right chest dialysis catheter.  Tip in right atrium and ready to use.    

## 2014-04-14 NOTE — Progress Notes (Signed)
Patient ID: [name], female   DOB: 30-Jun-1941, 73 y.o.   MRN: [id number]   Referring Physician(s): Hijazi  Subjective: Tunneled hemodialysis catheter was placed in IR 03/03/14 In use for dialysis and working well Noticed exposed cuff yesterday or before Request made for exchange/replacement   Allergies: Review of patient's allergies indicates no known allergies.  Medications: Prior to Admission medications   Medication Sig Start Date End Date Taking? Authorizing Provider  acetaminophen (TYLENOL) 325 MG tablet Give 650 mg by tube every 6 (six) hours as needed for moderate pain or fever.   Yes Historical Provider, MD  acetaminophen (TYLENOL) 650 MG suppository Place 650 mg rectally every 6 (six) hours as needed for mild pain.   Yes Historical Provider, MD  Amino Acids-Protein Hydrolys (FEEDING SUPPLEMENT, PRO-STAT SUGAR FREE 64,) LIQD Give 30 mLs by tube 4 (four) times daily.    Yes Historical Provider, MD  amiodarone (PACERONE) 200 MG tablet Give 200 mg by tube daily.   Yes Historical Provider, MD  aspirin EC 81 MG tablet Give 81 mg by tube daily.   Yes Historical Provider, MD  atorvastatin (LIPITOR) 20 MG tablet Give 20 mg by tube daily.   Yes Historical Provider, MD  dextrose (GLUTOSE) 40 % GEL Give 1 Tube by tube as needed for low blood sugar.    Yes Historical Provider, MD  dextrose 50 % solution Inject 11-25 mLs into the vein as needed for low blood sugar. Sliding scale: for low BG 70m < 22; 26m= 22-28; 2146m 29-35; 68m32m36-41; 17ml24m2-48; 15ml 81m- 55; 13ml= 58m 61; 11ml= 686m0; call md >70   Yes Historical Provider, MD  Dextrose-Sodium Chloride (DEXTROSE 5 % AND 0.45 % NACL) 5-0.45 % Inject 50 mL/hr into the vein as needed (hypoglycemia protocol). If pt is alert and NPO, run at 50ml.hr 40mcall doctor   Yes Historical Provider, MD  docusate (COLACE) 50 MG/5ML liquid Give 100 mg by tube 2 (two) times daily.   Yes Historical Provider, MD  famotidine (PEPCID) 20 MG  tablet Give 20 mg by tube 2 (two) times daily.   Yes Historical Provider, MD  furosemide (LASIX) 20 MG tablet Give 20 mg by tube See admin instructions. Give 20mg on T58may, Thursday, Saturday, and Sunday   Yes Historical Provider, MD  glucagon, human recombinant, (GLUCAGEN DIAGNOSTIC) 1 MG injection Inject 1 mg into the vein once as needed for low blood sugar.   Yes Historical Provider, MD  glucose blood test strip 1 each by Other route every 6 (six) hours. Implement hypoglycemic protocol   Yes Historical Provider, MD  guaifenesin (ROBITUSSIN) 100 MG/5ML syrup Give 200 mg by tube 3 (three) times daily as needed for cough.    Yes Historical Provider, MD  Guar Gum (NUTRISOURCE FIBER) PACK Give 1 Package by tube daily.   Yes Historical Provider, MD  insulin aspart (NOVOLOG) 100 UNIT/ML injection Inject 0-12 Units into the skin See admin instructions. Sliding scale BG below 70= 0 units; 70 - 150 give 0 units; 151- 200 give 2 units; 201- 250 give 4 units; 251- 300 give 6 units; 301- 350 give 8 units; 351 -400 give 10 units; for BG above 400 give 12 units and call doctor   Yes Historical Provider, MD  insulin detemir (LEVEMIR) 100 UNIT/ML injection Inject 40 Units into the skin 2 (two) times daily. Hold for BG< 150 or if NPO   Yes Historical Provider, MD  levothyroxine (SYNTHROID,  LEVOTHROID) 88 MCG tablet Give 88 mcg by tube daily before breakfast.   Yes Historical Provider, MD  magnesium oxide (MAG-OX) 400 MG tablet Give 400 mg by tube every 6 (six) hours as needed (for magnesium). MAG level 1.3 - 1.69mq/L give mag ox every 6 hours X 4 doses.  If MAG is < 1.2 meq/L see IV administration   Yes Historical Provider, MD  magnesium sulfate 10-5 MG/ML-% INFUSION Inject 1 g into the vein See admin instructions. Drip rate of 2066mhr If MAG < 0.8- call md; if MAG 0.9- 1.0 give mag 2gm IV over 1 hr X 3 doses; if MAG 1.0 - 1.2 give mag 2gm IV over 1 hr X 2 doses; if MAG 1.3- 1.4 give mag 2gm IV over 1 hr X 1 dose.   Repeat magnesium level 4 hours after infusion is complete   Yes Historical Provider, MD  metoCLOPramide (REGLAN) 5 MG tablet Give 5 mg by tube every 6 (six) hours.   Yes Historical Provider, MD  midodrine (PROAMATINE) 5 MG tablet Give 5 mg by tube 3 (three) times daily with meals.   Yes Historical Provider, MD  mirtazapine (REMERON) 15 MG tablet Give 15 mg by tube at bedtime.   Yes Historical Provider, MD  nitroGLYCERIN (NITROSTAT) 0.4 MG SL tablet Place 0.4 mg under the tongue every 5 (five) minutes as needed for chest pain.   Yes Historical Provider, MD  ondansetron (ZOFRAN) 4 MG tablet Give 4 mg by tube every 6 (six) hours as needed for nausea or vomiting.   Yes Historical Provider, MD  ondansetron (ZOFRAN) 4 MG/2ML SOLN injection Inject 2 mLs (4 mg total) into the vein every 6 (six) hours as needed for nausea or vomiting. 03/06/14  Yes GiCoolidge BreezePA-C  oxyCODONE (OXY IR/ROXICODONE) 5 MG immediate release tablet Give 5 mg by tube every 6 (six) hours as needed for moderate pain.    Yes Historical Provider, MD  polyethylene glycol (MIRALAX / GLYCOLAX) packet Give 17 g by tube daily.   Yes Historical Provider, MD  potassium chloride 10 MEQ/50ML Inject 10 mEq into the vein See admin instructions. Drip rate of 5044mr If potassium:   K <2.6me87mive 40me20mery 4 hours X 3 doses;  K= 2.7- 3.0meq 69me 40meq 46m 4 hours X 2 doses; K= 3.1- 3.4meq gi19m40meq X 23mase; K= 3.5- 5.1meq NO a32mtional KCL neededd   Yes Historical Provider, MD  potassium chloride 20 MEQ/15ML (10%) solution Give 40 mEq by tube every 4 (four) hours as needed. K= 3.1- 3.4 give 40meq X 1;65m2.7 - 3.6 40meq X 2 e71m 4 hours; K=< 2.6 see IV administration   Yes Historical Provider, MD  potassium chloride SA (K-DUR,KLOR-CON) 20 MEQ tablet Give 40 mEq by tube See admin instructions. If potassium is :   K=3.1- 3.4 take 40meq X 1 ta77m;   K=2.7- 3.0 take 40meq X 2 tab63m every four hours as needed for potassium levels   Yes  Historical Provider, MD  sertraline (ZOLOFT) 50 MG tablet Give 50 mg by tube daily.   Yes Historical Provider, MD  sevelamer carbonate (RENVELA) 2.4 G PACK Give 2.4 g by tube daily.   Yes Historical Provider, MD  sodium bicarbonate 650 MG tablet Give 650 mg by tube as needed for heartburn (for clogged tube).   Yes Historical Provider, MD  sodium chloride 0.9 % injection Inject 10 mLs into the vein 3 (three) times daily.   Yes Historical Provider, MD  sodium polystyrene (KAYEXALATE) 15 GM/60ML suspension Give 15 g by tube as needed (15gm for potasssium level 5.2 - 5.5 meq/ L;  for K > 5.57mq/L).    Yes Historical Provider, MD    Review of Systems  Vital Signs: BP 154/93  Pulse 98  Temp(Src) 99.1 F (37.3 C)  Resp 23  SpO2 99%  Physical Exam  Imaging: No results found.  Labs: Results for orders placed during the hospital encounter of 03/06/14 (from the past 48 hour(s))  PROTIME-INR     Status: Abnormal   Collection Time    04/13/14  5:45 AM      Result Value Ref Range   Prothrombin Time 20.2 (*) 11.6 - 15.2 seconds   INR 1.72 (*) 0.00 - 1.49  CBC     Status: Abnormal   Collection Time    04/13/14  5:45 AM      Result Value Ref Range   WBC 12.8 (*) 4.0 - 10.5 K/uL   RBC 3.17 (*) 3.87 - 5.11 MIL/uL   Hemoglobin 8.5 (*) 12.0 - 15.0 g/dL   HCT 28.0 (*) 36.0 - 46.0 %   MCV 88.3  78.0 - 100.0 fL   MCH 26.8  26.0 - 34.0 pg   MCHC 30.4  30.0 - 36.0 g/dL   RDW 17.9 (*) 11.5 - 15.5 %   Platelets 282  150 - 400 K/uL  PREALBUMIN     Status: Abnormal   Collection Time    04/13/14  5:45 AM      Result Value Ref Range   Prealbumin 13.4 (*) 17.0 - 34.0 mg/dL   Comment: Performed at [facility] RENAL FUNCTION PANEL     Status: Abnormal   Collection Time    04/13/14  5:45 AM      Result Value Ref Range   Sodium 128 (*) 137 - 147 mEq/L   Potassium 3.7  3.7 - 5.3 mEq/L   Chloride 87 (*) 96 - 112 mEq/L   CO2 25  19 - 32 mEq/L   Glucose, Bld 147 (*) 70 - 99 mg/dL   BUN 78  (*) 6 - 23 mg/dL   Creatinine, Ser 3.53 (*) 0.50 - 1.10 mg/dL   Calcium 8.3 (*) 8.4 - 10.5 mg/dL   Phosphorus 5.0 (*) 2.3 - 4.6 mg/dL   Albumin 2.3 (*) 3.5 - 5.2 g/dL   GFR calc non Af Amer 12 (*) >90 mL/min   GFR calc Af Amer 14 (*) >90 mL/min   Comment: (NOTE)     The eGFR has been calculated using the CKD EPI equation.     This calculation has not been validated in all clinical situations.     eGFR's persistently <90 mL/min signify possible Chronic Kidney     Disease.   Anion gap 16 (*) 5 - 15  PROTIME-INR     Status: Abnormal   Collection Time    04/14/14  7:55 AM      Result Value Ref Range   Prothrombin Time 20.7 (*) 11.6 - 15.2 seconds   INR 1.78 (*) 0.00 - 1.49  CBC     Status: Abnormal   Collection Time    04/14/14  7:55 AM      Result Value Ref Range   WBC 10.8 (*) 4.0 - 10.5 K/uL   RBC 3.23 (*) 3.87 - 5.11 MIL/uL   Hemoglobin 8.6 (*) 12.0 - 15.0 g/dL   HCT 28.6 (*) 36.0 - 46.0 %   MCV  88.5  78.0 - 100.0 fL   MCH 26.6  26.0 - 34.0 pg   MCHC 30.1  30.0 - 36.0 g/dL   RDW 17.8 (*) 11.5 - 15.5 %   Platelets 290  150 - 400 K/uL  BASIC METABOLIC PANEL     Status: Abnormal   Collection Time    04/14/14  7:55 AM      Result Value Ref Range   Sodium 130 (*) 137 - 147 mEq/L   Potassium 3.7  3.7 - 5.3 mEq/L   Chloride 89 (*) 96 - 112 mEq/L   CO2 28  19 - 32 mEq/L   Glucose, Bld 199 (*) 70 - 99 mg/dL   BUN 53 (*) 6 - 23 mg/dL   Creatinine, Ser 2.38 (*) 0.50 - 1.10 mg/dL   Calcium 8.4  8.4 - 10.5 mg/dL   GFR calc non Af Amer 19 (*) >90 mL/min   GFR calc Af Amer 22 (*) >90 mL/min   Comment: (NOTE)     The eGFR has been calculated using the CKD EPI equation.     This calculation has not been validated in all clinical situations.     eGFR's persistently <90 mL/min signify possible Chronic Kidney     Disease.   Anion gap 13  5 - 15    Assessment and Plan:  Exchange/replace tunneled hemodialysis catheter Placed 8/18 in IR Exposed cuff noticed recently I was unable  to get consent with family via phone RN to continue to try   I spent a total of 20 minutes face to face in clinical consultation/evaluation, greater than 50% of which was counseling/coordinating care for exchange of tunneled dialysis catheter  Signed: Perrin Eddleman A 04/14/2014, 10:05 AM

## 2014-04-15 LAB — CBC
HCT: 29.5 % — ABNORMAL LOW (ref 36.0–46.0)
Hemoglobin: 8.8 g/dL — ABNORMAL LOW (ref 12.0–15.0)
MCH: 27.2 pg (ref 26.0–34.0)
MCHC: 29.8 g/dL — ABNORMAL LOW (ref 30.0–36.0)
MCV: 91 fL (ref 78.0–100.0)
PLATELETS: 290 10*3/uL (ref 150–400)
RBC: 3.24 MIL/uL — ABNORMAL LOW (ref 3.87–5.11)
RDW: 17.9 % — ABNORMAL HIGH (ref 11.5–15.5)
WBC: 12.8 10*3/uL — AB (ref 4.0–10.5)

## 2014-04-15 LAB — RENAL FUNCTION PANEL
ALBUMIN: 2.3 g/dL — AB (ref 3.5–5.2)
Anion gap: 17 — ABNORMAL HIGH (ref 5–15)
BUN: 72 mg/dL — ABNORMAL HIGH (ref 6–23)
CALCIUM: 8.4 mg/dL (ref 8.4–10.5)
CO2: 24 meq/L (ref 19–32)
Chloride: 90 mEq/L — ABNORMAL LOW (ref 96–112)
Creatinine, Ser: 3 mg/dL — ABNORMAL HIGH (ref 0.50–1.10)
GFR, EST AFRICAN AMERICAN: 16 mL/min — AB (ref >90)
GFR, EST NON AFRICAN AMERICAN: 14 mL/min — AB (ref >90)
Glucose, Bld: 135 mg/dL — ABNORMAL HIGH (ref 70–99)
Phosphorus: 4.9 mg/dL — ABNORMAL HIGH (ref 2.3–4.6)
Potassium: 3.7 mEq/L (ref 3.7–5.3)
SODIUM: 131 meq/L — AB (ref 137–147)

## 2014-04-15 LAB — PROTIME-INR
INR: 2.21 — ABNORMAL HIGH (ref 0.00–1.49)
PROTHROMBIN TIME: 24.5 s — AB (ref 11.6–15.2)

## 2014-04-16 ENCOUNTER — Other Ambulatory Visit (HOSPITAL_COMMUNITY): Payer: Medicare Other

## 2014-04-16 LAB — PROTIME-INR
INR: 2.36 — AB (ref 0.00–1.49)
Prothrombin Time: 25.8 seconds — ABNORMAL HIGH (ref 11.6–15.2)

## 2014-04-16 MED ORDER — HEPARIN SODIUM (PORCINE) 1000 UNIT/ML IJ SOLN
INTRAMUSCULAR | Status: AC
  Filled 2014-04-16: qty 1

## 2014-04-16 MED ORDER — LIDOCAINE HCL 1 % IJ SOLN
INTRAMUSCULAR | Status: AC
  Filled 2014-04-16: qty 20

## 2014-04-16 MED ORDER — CEFAZOLIN (ANCEF) 1 G IV SOLR
2.0000 g | INTRAVENOUS | Status: AC
  Administered 2014-04-16: 2 g

## 2014-04-16 MED ORDER — CEFAZOLIN SODIUM-DEXTROSE 2-3 GM-% IV SOLR
INTRAVENOUS | Status: AC
  Filled 2014-04-16: qty 50

## 2014-04-16 MED ORDER — CHLORHEXIDINE GLUCONATE 4 % EX LIQD
CUTANEOUS | Status: AC
  Filled 2014-04-16: qty 15

## 2014-04-17 LAB — RENAL FUNCTION PANEL
Albumin: 2.3 g/dL — ABNORMAL LOW (ref 3.5–5.2)
Anion gap: 15 (ref 5–15)
BUN: 66 mg/dL — ABNORMAL HIGH (ref 6–23)
CHLORIDE: 91 meq/L — AB (ref 96–112)
CO2: 27 meq/L (ref 19–32)
CREATININE: 2.86 mg/dL — AB (ref 0.50–1.10)
Calcium: 8.4 mg/dL (ref 8.4–10.5)
GFR calc Af Amer: 17 mL/min — ABNORMAL LOW (ref >90)
GFR calc non Af Amer: 15 mL/min — ABNORMAL LOW (ref >90)
GLUCOSE: 148 mg/dL — AB (ref 70–99)
Phosphorus: 4.5 mg/dL (ref 2.3–4.6)
Potassium: 3.7 mEq/L (ref 3.7–5.3)
Sodium: 133 mEq/L — ABNORMAL LOW (ref 137–147)

## 2014-04-17 LAB — CBC
HCT: 29.8 % — ABNORMAL LOW (ref 36.0–46.0)
HEMOGLOBIN: 8.8 g/dL — AB (ref 12.0–15.0)
MCH: 26.9 pg (ref 26.0–34.0)
MCHC: 29.5 g/dL — ABNORMAL LOW (ref 30.0–36.0)
MCV: 91.1 fL (ref 78.0–100.0)
Platelets: 342 10*3/uL (ref 150–400)
RBC: 3.27 MIL/uL — AB (ref 3.87–5.11)
RDW: 17.8 % — ABNORMAL HIGH (ref 11.5–15.5)
WBC: 13.1 10*3/uL — ABNORMAL HIGH (ref 4.0–10.5)

## 2014-04-17 LAB — PROTIME-INR
INR: 2.48 — ABNORMAL HIGH (ref 0.00–1.49)
Prothrombin Time: 26.8 seconds — ABNORMAL HIGH (ref 11.6–15.2)

## 2014-04-18 LAB — CBC WITH DIFFERENTIAL/PLATELET
Basophils Absolute: 0.1 10*3/uL (ref 0.0–0.1)
Basophils Relative: 0 % (ref 0–1)
Eosinophils Absolute: 0.3 10*3/uL (ref 0.0–0.7)
Eosinophils Relative: 3 % (ref 0–5)
HEMATOCRIT: 28.8 % — AB (ref 36.0–46.0)
Hemoglobin: 8.5 g/dL — ABNORMAL LOW (ref 12.0–15.0)
LYMPHS PCT: 31 % (ref 12–46)
Lymphs Abs: 3.7 10*3/uL (ref 0.7–4.0)
MCH: 26.5 pg (ref 26.0–34.0)
MCHC: 29.5 g/dL — ABNORMAL LOW (ref 30.0–36.0)
MCV: 89.7 fL (ref 78.0–100.0)
MONO ABS: 1.1 10*3/uL — AB (ref 0.1–1.0)
MONOS PCT: 9 % (ref 3–12)
NEUTROS ABS: 6.6 10*3/uL (ref 1.7–7.7)
Neutrophils Relative %: 57 % (ref 43–77)
Platelets: 306 10*3/uL (ref 150–400)
RBC: 3.21 MIL/uL — ABNORMAL LOW (ref 3.87–5.11)
RDW: 17.6 % — ABNORMAL HIGH (ref 11.5–15.5)
WBC: 11.8 10*3/uL — AB (ref 4.0–10.5)

## 2014-04-18 LAB — PROTIME-INR
INR: 2.64 — AB (ref 0.00–1.49)
PROTHROMBIN TIME: 28.2 s — AB (ref 11.6–15.2)

## 2014-04-18 LAB — PROCALCITONIN: Procalcitonin: 0.68 ng/mL

## 2014-04-19 LAB — PROTIME-INR
INR: 2.59 — AB (ref 0.00–1.49)
Prothrombin Time: 27.8 seconds — ABNORMAL HIGH (ref 11.6–15.2)

## 2014-04-19 LAB — CLOSTRIDIUM DIFFICILE BY PCR: Toxigenic C. Difficile by PCR: NEGATIVE

## 2014-04-20 LAB — CBC
HCT: 29.2 % — ABNORMAL LOW (ref 36.0–46.0)
Hemoglobin: 8.8 g/dL — ABNORMAL LOW (ref 12.0–15.0)
MCH: 26.3 pg (ref 26.0–34.0)
MCHC: 30.1 g/dL (ref 30.0–36.0)
MCV: 87.2 fL (ref 78.0–100.0)
PLATELETS: 343 10*3/uL (ref 150–400)
RBC: 3.35 MIL/uL — AB (ref 3.87–5.11)
RDW: 17.4 % — ABNORMAL HIGH (ref 11.5–15.5)
WBC: 12.5 10*3/uL — AB (ref 4.0–10.5)

## 2014-04-20 LAB — RENAL FUNCTION PANEL
Albumin: 2.3 g/dL — ABNORMAL LOW (ref 3.5–5.2)
Anion gap: 16 — ABNORMAL HIGH (ref 5–15)
BUN: 88 mg/dL — ABNORMAL HIGH (ref 6–23)
CALCIUM: 8.3 mg/dL — AB (ref 8.4–10.5)
CHLORIDE: 88 meq/L — AB (ref 96–112)
CO2: 25 meq/L (ref 19–32)
Creatinine, Ser: 3.21 mg/dL — ABNORMAL HIGH (ref 0.50–1.10)
GFR calc non Af Amer: 13 mL/min — ABNORMAL LOW (ref >90)
GFR, EST AFRICAN AMERICAN: 15 mL/min — AB (ref >90)
Glucose, Bld: 162 mg/dL — ABNORMAL HIGH (ref 70–99)
Phosphorus: 5.5 mg/dL — ABNORMAL HIGH (ref 2.3–4.6)
Potassium: 3.9 mEq/L (ref 3.7–5.3)
Sodium: 129 mEq/L — ABNORMAL LOW (ref 137–147)

## 2014-04-20 LAB — T4, FREE: Free T4: 0.8 ng/dL (ref 0.80–1.80)

## 2014-04-20 LAB — PROTIME-INR
INR: 2.9 — ABNORMAL HIGH (ref 0.00–1.49)
PROTHROMBIN TIME: 30.3 s — AB (ref 11.6–15.2)

## 2014-04-20 LAB — TSH: TSH: 3.46 u[IU]/mL (ref 0.350–4.500)

## 2014-04-20 LAB — PREALBUMIN: PREALBUMIN: 11 mg/dL — AB (ref 17.0–34.0)

## 2014-05-04 ENCOUNTER — Other Ambulatory Visit (HOSPITAL_COMMUNITY): Payer: Self-pay | Admitting: Internal Medicine

## 2014-05-04 DIAGNOSIS — R1310 Dysphagia, unspecified: Secondary | ICD-10-CM

## 2014-05-07 ENCOUNTER — Other Ambulatory Visit (HOSPITAL_COMMUNITY): Payer: Medicare Other

## 2014-05-13 ENCOUNTER — Other Ambulatory Visit (HOSPITAL_COMMUNITY): Payer: Medicare Other

## 2014-05-14 ENCOUNTER — Emergency Department (HOSPITAL_COMMUNITY): Payer: Medicare Other

## 2014-05-14 ENCOUNTER — Inpatient Hospital Stay (HOSPITAL_COMMUNITY)
Admission: EM | Admit: 2014-05-14 | Discharge: 2014-05-23 | DRG: 871 | Disposition: A | Discharge status: Hospice | Payer: Medicare Other | Attending: Internal Medicine | Admitting: Internal Medicine

## 2014-05-14 DIAGNOSIS — I25810 Atherosclerosis of coronary artery bypass graft(s) without angina pectoris: Secondary | ICD-10-CM | POA: Diagnosis present

## 2014-05-14 DIAGNOSIS — Z66 Do not resuscitate: Secondary | ICD-10-CM | POA: Diagnosis present

## 2014-05-14 DIAGNOSIS — Z794 Long term (current) use of insulin: Secondary | ICD-10-CM

## 2014-05-14 DIAGNOSIS — N186 End stage renal disease: Secondary | ICD-10-CM | POA: Diagnosis present

## 2014-05-14 DIAGNOSIS — R4182 Altered mental status, unspecified: Secondary | ICD-10-CM

## 2014-05-14 DIAGNOSIS — Z951 Presence of aortocoronary bypass graft: Secondary | ICD-10-CM

## 2014-05-14 DIAGNOSIS — Z8249 Family history of ischemic heart disease and other diseases of the circulatory system: Secondary | ICD-10-CM

## 2014-05-14 DIAGNOSIS — Z7982 Long term (current) use of aspirin: Secondary | ICD-10-CM | POA: Diagnosis not present

## 2014-05-14 DIAGNOSIS — J9601 Acute respiratory failure with hypoxia: Secondary | ICD-10-CM | POA: Diagnosis present

## 2014-05-14 DIAGNOSIS — L89619 Pressure ulcer of right heel, unspecified stage: Secondary | ICD-10-CM | POA: Diagnosis present

## 2014-05-14 DIAGNOSIS — I10 Essential (primary) hypertension: Secondary | ICD-10-CM

## 2014-05-14 DIAGNOSIS — I120 Hypertensive chronic kidney disease with stage 5 chronic kidney disease or end stage renal disease: Secondary | ICD-10-CM | POA: Diagnosis present

## 2014-05-14 DIAGNOSIS — R0602 Shortness of breath: Secondary | ICD-10-CM

## 2014-05-14 DIAGNOSIS — Z8673 Personal history of transient ischemic attack (TIA), and cerebral infarction without residual deficits: Secondary | ICD-10-CM | POA: Diagnosis not present

## 2014-05-14 DIAGNOSIS — G9340 Encephalopathy, unspecified: Secondary | ICD-10-CM | POA: Diagnosis present

## 2014-05-14 DIAGNOSIS — I739 Peripheral vascular disease, unspecified: Secondary | ICD-10-CM | POA: Diagnosis present

## 2014-05-14 DIAGNOSIS — Z7952 Long term (current) use of systemic steroids: Secondary | ICD-10-CM

## 2014-05-14 DIAGNOSIS — L89159 Pressure ulcer of sacral region, unspecified stage: Secondary | ICD-10-CM | POA: Diagnosis present

## 2014-05-14 DIAGNOSIS — J9602 Acute respiratory failure with hypercapnia: Secondary | ICD-10-CM | POA: Diagnosis present

## 2014-05-14 DIAGNOSIS — L89629 Pressure ulcer of left heel, unspecified stage: Secondary | ICD-10-CM | POA: Diagnosis present

## 2014-05-14 DIAGNOSIS — R197 Diarrhea, unspecified: Secondary | ICD-10-CM | POA: Diagnosis present

## 2014-05-14 DIAGNOSIS — I5022 Chronic systolic (congestive) heart failure: Secondary | ICD-10-CM | POA: Diagnosis present

## 2014-05-14 DIAGNOSIS — Z515 Encounter for palliative care: Secondary | ICD-10-CM

## 2014-05-14 DIAGNOSIS — I2119 ST elevation (STEMI) myocardial infarction involving other coronary artery of inferior wall: Secondary | ICD-10-CM | POA: Diagnosis present

## 2014-05-14 DIAGNOSIS — F0390 Unspecified dementia without behavioral disturbance: Secondary | ICD-10-CM | POA: Diagnosis present

## 2014-05-14 DIAGNOSIS — J189 Pneumonia, unspecified organism: Secondary | ICD-10-CM | POA: Diagnosis present

## 2014-05-14 DIAGNOSIS — E119 Type 2 diabetes mellitus without complications: Secondary | ICD-10-CM | POA: Diagnosis present

## 2014-05-14 DIAGNOSIS — Z792 Long term (current) use of antibiotics: Secondary | ICD-10-CM | POA: Diagnosis not present

## 2014-05-14 DIAGNOSIS — A419 Sepsis, unspecified organism: Secondary | ICD-10-CM | POA: Diagnosis present

## 2014-05-14 DIAGNOSIS — Z952 Presence of prosthetic heart valve: Secondary | ICD-10-CM

## 2014-05-14 DIAGNOSIS — Y95 Nosocomial condition: Secondary | ICD-10-CM | POA: Diagnosis present

## 2014-05-14 DIAGNOSIS — Z79899 Other long term (current) drug therapy: Secondary | ICD-10-CM | POA: Diagnosis not present

## 2014-05-14 DIAGNOSIS — R6521 Severe sepsis with septic shock: Secondary | ICD-10-CM | POA: Diagnosis present

## 2014-05-14 DIAGNOSIS — Z992 Dependence on renal dialysis: Secondary | ICD-10-CM | POA: Diagnosis not present

## 2014-05-14 DIAGNOSIS — N179 Acute kidney failure, unspecified: Secondary | ICD-10-CM | POA: Diagnosis present

## 2014-05-14 DIAGNOSIS — E872 Acidosis: Secondary | ICD-10-CM | POA: Diagnosis present

## 2014-05-14 DIAGNOSIS — Z7901 Long term (current) use of anticoagulants: Secondary | ICD-10-CM | POA: Diagnosis not present

## 2014-05-14 DIAGNOSIS — E785 Hyperlipidemia, unspecified: Secondary | ICD-10-CM | POA: Diagnosis present

## 2014-05-14 DIAGNOSIS — N2581 Secondary hyperparathyroidism of renal origin: Secondary | ICD-10-CM | POA: Diagnosis present

## 2014-05-14 DIAGNOSIS — J90 Pleural effusion, not elsewhere classified: Secondary | ICD-10-CM

## 2014-05-14 DIAGNOSIS — I4891 Unspecified atrial fibrillation: Secondary | ICD-10-CM | POA: Diagnosis present

## 2014-05-14 DIAGNOSIS — D649 Anemia, unspecified: Secondary | ICD-10-CM | POA: Diagnosis present

## 2014-05-14 DIAGNOSIS — I252 Old myocardial infarction: Secondary | ICD-10-CM | POA: Diagnosis not present

## 2014-05-14 DIAGNOSIS — I2510 Atherosclerotic heart disease of native coronary artery without angina pectoris: Secondary | ICD-10-CM

## 2014-05-14 LAB — BLOOD GAS, ARTERIAL
Acid-Base Excess: 1.2 mmol/L (ref 0.0–2.0)
BICARBONATE: 25.8 meq/L — AB (ref 20.0–24.0)
DELIVERY SYSTEMS: POSITIVE
Drawn by: 38235
Expiratory PAP: 5
FIO2: 0.35 %
Inspiratory PAP: 10
O2 SAT: 98.4 %
PO2 ART: 120 mmHg — AB (ref 80.0–100.0)
Patient temperature: 37
RATE: 10 resp/min
TCO2: 23.8 mmol/L (ref 0–100)
pCO2 arterial: 44.8 mmHg (ref 35.0–45.0)
pH, Arterial: 7.378 (ref 7.350–7.450)

## 2014-05-14 LAB — COMPREHENSIVE METABOLIC PANEL
ALT: 13 U/L (ref 0–35)
ANION GAP: 13 (ref 5–15)
AST: 26 U/L (ref 0–37)
Albumin: 2.1 g/dL — ABNORMAL LOW (ref 3.5–5.2)
Alkaline Phosphatase: 124 U/L — ABNORMAL HIGH (ref 39–117)
BUN: 25 mg/dL — AB (ref 6–23)
CALCIUM: 8.1 mg/dL — AB (ref 8.4–10.5)
CO2: 24 mEq/L (ref 19–32)
Chloride: 98 mEq/L (ref 96–112)
Creatinine, Ser: 1.73 mg/dL — ABNORMAL HIGH (ref 0.50–1.10)
GFR, EST AFRICAN AMERICAN: 32 mL/min — AB (ref >90)
GFR, EST NON AFRICAN AMERICAN: 28 mL/min — AB (ref >90)
GLUCOSE: 288 mg/dL — AB (ref 70–99)
Potassium: 4 mEq/L (ref 3.7–5.3)
Sodium: 135 mEq/L — ABNORMAL LOW (ref 137–147)
Total Bilirubin: 0.4 mg/dL (ref 0.3–1.2)
Total Protein: 6.8 g/dL (ref 6.0–8.3)

## 2014-05-14 LAB — CBC WITH DIFFERENTIAL/PLATELET
BASOS PCT: 0 % (ref 0–1)
Basophils Absolute: 0 10*3/uL (ref 0.0–0.1)
Eosinophils Absolute: 0 10*3/uL (ref 0.0–0.7)
Eosinophils Relative: 0 % (ref 0–5)
HCT: 36.1 % (ref 36.0–46.0)
Hemoglobin: 10.4 g/dL — ABNORMAL LOW (ref 12.0–15.0)
Lymphocytes Relative: 8 % — ABNORMAL LOW (ref 12–46)
Lymphs Abs: 0.9 10*3/uL (ref 0.7–4.0)
MCH: 27.6 pg (ref 26.0–34.0)
MCHC: 28.8 g/dL — ABNORMAL LOW (ref 30.0–36.0)
MCV: 95.8 fL (ref 78.0–100.0)
MONO ABS: 0.8 10*3/uL (ref 0.1–1.0)
Monocytes Relative: 7 % (ref 3–12)
Neutro Abs: 9.6 10*3/uL — ABNORMAL HIGH (ref 1.7–7.7)
Neutrophils Relative %: 85 % — ABNORMAL HIGH (ref 43–77)
PLATELETS: 283 10*3/uL (ref 150–400)
RBC: 3.77 MIL/uL — AB (ref 3.87–5.11)
RDW: 26.5 % — ABNORMAL HIGH (ref 11.5–15.5)
WBC: 11.3 10*3/uL — ABNORMAL HIGH (ref 4.0–10.5)

## 2014-05-14 LAB — LACTIC ACID, PLASMA: LACTIC ACID, VENOUS: 2 mmol/L (ref 0.5–2.2)

## 2014-05-14 LAB — TROPONIN I: Troponin I: <0.3 ng/mL (ref <0.30)

## 2014-05-14 LAB — PRO B NATRIURETIC PEPTIDE: PRO B NATRI PEPTIDE: 31667 pg/mL — AB (ref 0–450)

## 2014-05-14 MED ORDER — VANCOMYCIN HCL IN DEXTROSE 1-5 GM/200ML-% IV SOLN
1000.0000 mg | Freq: Once | INTRAVENOUS | Status: AC
  Administered 2014-05-14: 1000 mg via INTRAVENOUS
  Filled 2014-05-14: qty 200

## 2014-05-14 MED ORDER — FUROSEMIDE 10 MG/ML IJ SOLN
40.0000 mg | Freq: Once | INTRAMUSCULAR | Status: AC
  Administered 2014-05-14: 40 mg via INTRAVENOUS
  Filled 2014-05-14: qty 4

## 2014-05-14 MED ORDER — PIPERACILLIN-TAZOBACTAM 3.375 G IVPB 30 MIN
3.3750 g | Freq: Once | INTRAVENOUS | Status: AC
  Administered 2014-05-14: 3.375 g via INTRAVENOUS
  Filled 2014-05-14: qty 50

## 2014-05-14 NOTE — ED Notes (Signed)
Spoke with [name] from [facility]. Pt has been being tx for pneumonia and had dialysis today. NP was concerned about fluid overload and wanted pt to be seen. Pt is a full code per [facility] and the paperwork they sent with her.

## 2014-05-14 NOTE — ED Notes (Signed)
Pt removed from BIPAP and placed on nasal cannula.  

## 2014-05-14 NOTE — ED Notes (Signed)
Per EMS, patient is from [facility].  EMS states patient was supine in bed with audible rales noted from across the room.

## 2014-05-14 NOTE — ED Provider Notes (Addendum)
CSN: [id number]     Arrival date & time 05/14/14  4098 History  This chart was scribed for Benny Lennert, MD by Richarda Overlie, ED Scribe. This patient was seen in room APA07/APA07 and the patient's care was started 7:58 PM.    Chief Complaint  Patient presents with  . Respiratory Distress   LEVEL 5  CAVEAT - Altered Mental Status   Patient is a 73 y.o. female presenting with shortness of breath. The history is provided by the nursing home (the nh states the pt was hypoxic at the nh.  tx for pneumonia). No language interpreter was used.  Shortness of Breath Severity:  Moderate Onset quality:  Sudden Timing:  Constant Associated symptoms: no abdominal pain, no chest pain, no cough, no headaches and no rash    HPI Comments: [name] is a 73 y.o. female who presents to the Emergency Department complaining of respiratory distress. Per EMS, pt is from [facility]. EMS states patient was supine in bed with audible rales noted from aross the room.   PCP STONE   Past Medical History  Diagnosis Date  . History of MI (myocardial infarction)     PCI done @ [facility] (cannot get cath report on Care Everywhere(  . Essential hypertension   . TIA (transient ischemic attack)   . Thyroid disease   . Anxiety   . Diabetes mellitus type 2 with peripheral artery disease     On insulin  . PAD (peripheral artery disease)   . Hyperlipidemia associated with type 2 diabetes mellitus   . A-fib   . ARF (acute renal failure)    Past Surgical History  Procedure Laterality Date  . Cardiac catheterization      PCI - unknown vessel or stent; unknown date; @ [facility].  . Abdominal hysterectomy    . Cholecystectomy    . 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 Mitral valve replacement N/A 01/25/2014    Procedure: MITRAL VALVE (MV) REPLACEMENT;  Surgeon: Kerin Perna, MD;  Location: [facility] OR;  Service: Open Heart Surgery;  Laterality: N/A;  . Av fistula placement Right 04/09/2014    Procedure: ARTERIOVENOUS (AV) FISTULA CREATION;  Surgeon: Chuck Hint, MD;  Location: [facility] OR;  Service: Vascular;  Laterality: Right;   Family History  Problem Relation Age of Onset  . Heart attack Mother   . Heart disease Mother   . Heart attack Father   . Heart disease Father    History  Substance Use Topics  . Smoking status: Never Smoker   . Smokeless tobacco: Never Used  . Alcohol Use: No   OB History   Grav Para Term Preterm Abortions TAB SAB Ect Mult Living                 Review of Systems  Unable to perform ROS: Mental status change  Constitutional: Negative for appetite change and fatigue.  HENT: Negative for congestion, ear discharge and sinus pressure.   Eyes: Negative for discharge.  Respiratory: Positive for shortness of breath. Negative for cough.   Cardiovascular: Negative for chest pain.  Gastrointestinal: Negative for abdominal pain and diarrhea.  Genitourinary: Negative for frequency and hematuria.  Musculoskeletal: Negative for back pain.  Skin: Negative for rash.  Neurological: Negative for seizures and headaches.  Psychiatric/Behavioral: Negative for hallucinations.      Allergies  Review of patient's allergies indicates no known allergies.  Home Medications   Prior to Admission medications   Medication Sig Start Date End Date Taking? Authorizing Provider  acetaminophen (TYLENOL) 325 MG tablet Give 650 mg by tube every 6 (six) hours as needed for moderate pain or fever.    Historical Provider, MD  acetaminophen (TYLENOL) 650 MG suppository Place 650 mg rectally every 6 (six) hours as needed for mild pain.    Historical Provider, MD  Amino Acids-Protein Hydrolys (FEEDING SUPPLEMENT, PRO-STAT SUGAR FREE 64,) LIQD  Give 30 mLs by tube 4 (four) times daily.     Historical Provider, MD  amiodarone (PACERONE) 200 MG tablet Give 200 mg by tube daily.    Historical Provider, MD  aspirin EC 81 MG tablet Give 81 mg by tube daily.    Historical Provider, MD  atorvastatin (LIPITOR) 20 MG tablet Give 20 mg by tube daily.    Historical Provider, MD  dextrose (GLUTOSE) 40 % GEL Give 1 Tube by tube as needed for low blood sugar.     Historical Provider, MD  dextrose 50 % solution Inject 11-25 mLs into the vein as needed for low blood sugar. Sliding scale: for low BG 25ml < 22; 23ml = 22-28; 21ml = 29-35; 19ml = 36-41; 17ml = 42-48; 15ml = 49- 55; 68ml= 56 - 61; 65ml= 62- 70; call md >70    Historical Provider, MD  Dextrose-Sodium Chloride (DEXTROSE 5 % AND 0.45 % NACL) 5-0.45 % Inject 50 mL/hr into the vein as needed (hypoglycemia protocol). If pt is alert and NPO, run at 78ml.hr and call doctor    Historical Provider, MD  docusate (COLACE) 50 MG/5ML liquid Give 100 mg by tube 2 (two) times daily.    Historical Provider, MD  famotidine (PEPCID) 20 MG tablet Give 20 mg by tube 2 (two) times daily.    Historical Provider, MD  furosemide (LASIX) 20 MG tablet Give 20 mg by tube See admin instructions. Give 20mg  on Tuesday, Thursday, Saturday, and Sunday    Historical Provider, MD  glucagon, human recombinant, (GLUCAGEN DIAGNOSTIC) 1 MG injection Inject 1 mg into the vein once as needed for low blood sugar.    Historical Provider, MD  glucose blood test strip 1 each by Other route every 6 (six) hours. Implement hypoglycemic protocol    Historical Provider, MD  guaifenesin (ROBITUSSIN) 100 MG/5ML syrup Give 200 mg by tube 3 (three) times daily as needed for cough.     Historical Provider, MD  Guar Gum (NUTRISOURCE FIBER) PACK Give 1 Package by tube daily.    Historical Provider, MD  insulin aspart (NOVOLOG) 100 UNIT/ML injection Inject 0-12 Units into the skin See admin instructions. Sliding scale BG below 70= 0 units; 70 - 150  give 0 units; 151- 200 give 2 units; 201- 250 give 4 units; 251- 300 give 6 units; 301- 350 give 8 units; 351 -400 give 10 units; for BG above 400 give 12 units and call doctor    Historical Provider, MD  insulin detemir (LEVEMIR) 100 UNIT/ML injection Inject 40 Units into the skin 2 (two) times daily. Hold for BG< 150 or if NPO    Historical Provider, MD  levothyroxine (SYNTHROID, LEVOTHROID) 88 MCG tablet Give 88 mcg by tube daily before breakfast.    Historical Provider, MD  magnesium oxide (MAG-OX) 400 MG  tablet Give 400 mg by tube every 6 (six) hours as needed (for magnesium). MAG level 1.3 - 1.59meq/L give mag ox every 6 hours X 4 doses.  If MAG is < 1.2 meq/L see IV administration    Historical Provider, MD  magnesium sulfate 10-5 MG/ML-% INFUSION Inject 1 g into the vein See admin instructions. Drip rate of 253ml/hr If MAG < 0.8- call md; if MAG 0.9- 1.0 give mag 2gm IV over 1 hr X 3 doses; if MAG 1.0 - 1.2 give mag 2gm IV over 1 hr X 2 doses; if MAG 1.3- 1.4 give mag 2gm IV over 1 hr X 1 dose.  Repeat magnesium level 4 hours after infusion is complete    Historical Provider, MD  metoCLOPramide (REGLAN) 5 MG tablet Give 5 mg by tube every 6 (six) hours.    Historical Provider, MD  midodrine (PROAMATINE) 5 MG tablet Give 5 mg by tube 3 (three) times daily with meals.    Historical Provider, MD  mirtazapine (REMERON) 15 MG tablet Give 15 mg by tube at bedtime.    Historical Provider, MD  nitroGLYCERIN (NITROSTAT) 0.4 MG SL tablet Place 0.4 mg under the tongue every 5 (five) minutes as needed for chest pain.    Historical Provider, MD  ondansetron (ZOFRAN) 4 MG tablet Give 4 mg by tube every 6 (six) hours as needed for nausea or vomiting.    Historical Provider, MD  ondansetron (ZOFRAN) 4 MG/2ML SOLN injection Inject 2 mLs (4 mg total) into the vein every 6 (six) hours as needed for nausea or vomiting. 03/06/14   Wilmon Pali, PA-C  oxyCODONE (OXY IR/ROXICODONE) 5 MG immediate release tablet Give 5  mg by tube every 6 (six) hours as needed for moderate pain.     Historical Provider, MD  polyethylene glycol (MIRALAX / GLYCOLAX) packet Give 17 g by tube daily.    Historical Provider, MD  potassium chloride 10 MEQ/50ML Inject 10 mEq into the vein See admin instructions. Drip rate of 36ml/hr If potassium:   K <2.2meq give every 4 hours X 3 doses;  K= 2.7- 3.49meq give ever 4 hours X 2 doses; K= 3.1- 3.81meq give X 1 doase; K= 3.5- 5.3meq NO additional KCL neededd    Historical Provider, MD  potassium chloride 20 MEQ/15ML (10%) solution Give 40 mEq by tube every 4 (four) hours as needed. K= 3.1- 3.4 give X 1; K= 2.7 - 3.6 X 2 every 4 hours; K=< 2.6 see IV administration    Historical Provider, MD  potassium chloride SA (K-DUR,KLOR-CON) 20 MEQ tablet Give 40 mEq by tube See admin instructions. If potassium is :   K=3.1- 3.4 take X 1 tablet;   K=2.7- 3.0 take X 2 tablets every four hours as needed for potassium levels    Historical Provider, MD  sertraline (ZOLOFT) 50 MG tablet Give 50 mg by tube daily.    Historical Provider, MD  sevelamer carbonate (RENVELA) 2.4 G PACK Give 2.4 g by tube daily.    Historical Provider, MD  sodium bicarbonate 650 MG tablet Give 650 mg by tube as needed for heartburn (for clogged tube).    Historical Provider, MD  sodium chloride 0.9 % injection Inject 10 mLs into the vein 3 (three) times daily.    Historical Provider, MD  sodium polystyrene (KAYEXALATE) 15 GM/60ML suspension Give 15 g by tube as needed (15gm for potasssium level 5.2 - 5.5 meq/ L;  for  K > 5.21meq/L).     Historical Provider, MD   BP 103/50  Pulse 75  Temp(Src) 98.9 F (37.2 C) (Rectal)  Resp 26  SpO2 99% Physical Exam  Nursing note and vitals reviewed. Constitutional: She is oriented to person, place, and time. She appears well-developed.  Only responding to painful stimuli  Dialysis catheter in right subclavian  PEG tube   HENT:  Head: Normocephalic.   Eyes: Conjunctivae and EOM are normal. No scleral icterus.  Neck: Neck supple. No thyromegaly present.  Cardiovascular: Normal rate and regular rhythm.  Exam reveals no gallop and no friction rub.   No murmur heard. Pulmonary/Chest: No stridor. She is in respiratory distress. She has no wheezes. She has rales. She exhibits no tenderness.  Abdominal: She exhibits no distension. There is no tenderness. There is no rebound.  Musculoskeletal: Normal range of motion. She exhibits no edema.  Lymphadenopathy:    She has no cervical adenopathy.  Neurological: She is oriented to person, place, and time. She exhibits normal muscle tone. Coordination normal.  Skin: No rash noted. No erythema.  Psychiatric: She has a normal mood and affect. Her behavior is normal.    ED Course  Procedures  DIAGNOSTIC STUDIES:     COORDINATION OF CARE: 8:02 PM Discussed treatment plan with pt at bedside and pt agreed to plan.   Labs Review Labs Reviewed  CBC WITH DIFFERENTIAL - Abnormal; Notable for the following:    WBC 11.3 (*)    RBC 3.77 (*)    Hemoglobin 10.4 (*)    MCHC 28.8 (*)    RDW 26.5 (*)    Neutrophils Relative % 85 (*)    Lymphocytes Relative 8 (*)    Neutro Abs 9.6 (*)    All other components within normal limits  COMPREHENSIVE METABOLIC PANEL - Abnormal; Notable for the following:    Sodium 135 (*)    Glucose, Bld 288 (*)    BUN 25 (*)    Creatinine, Ser 1.73 (*)    Calcium 8.1 (*)    Albumin 2.1 (*)    Alkaline Phosphatase 124 (*)    GFR calc non Af Amer 28 (*)    GFR calc Af Amer 32 (*)    All other components within normal limits  BLOOD GAS, ARTERIAL - Abnormal; Notable for the following:    pO2, Arterial 120.0 (*)    Bicarbonate 25.8 (*)    All other components within normal limits  PRO B NATRIURETIC PEPTIDE - Abnormal; Notable for the following:    Pro B Natriuretic peptide (BNP) 31667.0 (*)    All other components within normal limits  TROPONIN I  LACTIC ACID, PLASMA     Imaging Review Dg Chest Portable 1 View  05/14/2014   CLINICAL DATA:  Difficulty breathing and altered mental status ; acute renal failure  EXAM: PORTABLE CHEST - 1 VIEW  COMPARISON:  March 29, 2014  FINDINGS: There is consolidation in the left lower lobe with minimal left effusion. Lungs elsewhere clear. Heart is mildly enlarged with pulmonary vascularity within normal limits. No adenopathy. Central catheter tip is at the cavoatrial junction. No pneumothorax. Patient is status post mitral valve replacement.  IMPRESSION: Left lower lobe consolidation with small left effusion. Right lung clear. Heart prominent. Patient is status post mitral valve replacement.   Electronically Signed   By: Bretta Bang M.D.   On: 05/14/2014 20:58     EKG Interpretation   Date/Time:  Thursday May 14 2014 19:42:50  EDT Ventricular Rate:  83 PR Interval:  313 QRS Duration: 153 QT Interval:  397 QTC Calculation: 466 R Axis:   -17 Text Interpretation:  Sinus rhythm Prolonged PR interval Left bundle  branch block Confirmed by Myrlene Riera  MD, Evangelos Paulino [phone]([id number]) on 05/14/2014  11:02:15 PM     CRITICAL CARE Performed by: Linah Klapper L Total critical care time: 40 Critical care time was exclusive of separately billable procedures and treating other patients. Critical care was necessary to treat or prevent imminent or life-threatening deterioration. Critical care was time spent personally by me on the following activities: development of treatment plan with patient and/or surrogate as well as nursing, discussions with consultants, evaluation of patient's response to treatment, examination of patient, obtaining history from patient or surrogate, ordering and performing treatments and interventions, ordering and review of laboratory studies, ordering and review of radiographic studies, pulse oximetry and re-evaluation of patient's condition.  MDM   Final diagnoses:  SOB (shortness of breath)    The  chart was scribed for me under my direct supervision.  I personally performed the history, physical, and medical decision making and all procedures in the evaluation of this patient.Benny Lennert.      Daruis Swaim L Shae Hinnenkamp, MD 05/14/14 [id number]  Benny LennertJoseph L Abdulhadi Stopa, MD 05/14/14 [phone]

## 2014-05-14 NOTE — ED Notes (Signed)
Patient on CPAP on arrival with EMS.  Patient does not answer questions.

## 2014-05-14 NOTE — Progress Notes (Addendum)
Patient taken off BIPAP per MD. Placed on 4 lpm nasal cannula.  O2 sats 94%

## 2014-05-14 NOTE — ED Notes (Signed)
Pt has flexi-seal placed with no drainage noted in tube. Pt has diarrhea in diaper. Cleaned pt. Pt's perineal area is raw, pressure ulcer at approximately stage 2 with partial thickness loss of the dermis.

## 2014-05-14 NOTE — ED Notes (Signed)
Hope reports that the flexi-seal was placed yesterday with no output since it was placed and it has not been adjusted since it was placed.

## 2014-05-15 ENCOUNTER — Encounter (HOSPITAL_COMMUNITY): Payer: Self-pay | Admitting: *Deleted

## 2014-05-15 ENCOUNTER — Inpatient Hospital Stay (HOSPITAL_COMMUNITY): Payer: Medicare Other

## 2014-05-15 DIAGNOSIS — Z954 Presence of other heart-valve replacement: Secondary | ICD-10-CM

## 2014-05-15 DIAGNOSIS — Z992 Dependence on renal dialysis: Secondary | ICD-10-CM

## 2014-05-15 DIAGNOSIS — I10 Essential (primary) hypertension: Secondary | ICD-10-CM

## 2014-05-15 DIAGNOSIS — Z951 Presence of aortocoronary bypass graft: Secondary | ICD-10-CM

## 2014-05-15 DIAGNOSIS — I2510 Atherosclerotic heart disease of native coronary artery without angina pectoris: Secondary | ICD-10-CM

## 2014-05-15 DIAGNOSIS — R0602 Shortness of breath: Secondary | ICD-10-CM

## 2014-05-15 DIAGNOSIS — I739 Peripheral vascular disease, unspecified: Secondary | ICD-10-CM

## 2014-05-15 DIAGNOSIS — J189 Pneumonia, unspecified organism: Secondary | ICD-10-CM

## 2014-05-15 DIAGNOSIS — N186 End stage renal disease: Secondary | ICD-10-CM

## 2014-05-15 LAB — TROPONIN I: Troponin I: <0.3 ng/mL (ref <0.30)

## 2014-05-15 LAB — BASIC METABOLIC PANEL
Anion gap: 13 (ref 5–15)
BUN: 29 mg/dL — ABNORMAL HIGH (ref 6–23)
CHLORIDE: 97 meq/L (ref 96–112)
CO2: 26 mEq/L (ref 19–32)
Calcium: 8.2 mg/dL — ABNORMAL LOW (ref 8.4–10.5)
Creatinine, Ser: 1.89 mg/dL — ABNORMAL HIGH (ref 0.50–1.10)
GFR calc Af Amer: 29 mL/min — ABNORMAL LOW (ref >90)
GFR calc non Af Amer: 25 mL/min — ABNORMAL LOW (ref >90)
GLUCOSE: 241 mg/dL — AB (ref 70–99)
POTASSIUM: 3.8 meq/L (ref 3.7–5.3)
Sodium: 136 mEq/L — ABNORMAL LOW (ref 137–147)

## 2014-05-15 LAB — PROTIME-INR
INR: 2.08 — AB (ref 0.00–1.49)
PROTHROMBIN TIME: 23.6 s — AB (ref 11.6–15.2)

## 2014-05-15 LAB — CBC WITH DIFFERENTIAL/PLATELET
BASOS PCT: 0 % (ref 0–1)
Basophils Absolute: 0 10*3/uL (ref 0.0–0.1)
Eosinophils Absolute: 0.1 10*3/uL (ref 0.0–0.7)
Eosinophils Relative: 1 % (ref 0–5)
HCT: 35.6 % — ABNORMAL LOW (ref 36.0–46.0)
HEMOGLOBIN: 10.3 g/dL — AB (ref 12.0–15.0)
LYMPHS PCT: 24 % (ref 12–46)
Lymphs Abs: 3 10*3/uL (ref 0.7–4.0)
MCH: 27.5 pg (ref 26.0–34.0)
MCHC: 28.9 g/dL — ABNORMAL LOW (ref 30.0–36.0)
MCV: 95.2 fL (ref 78.0–100.0)
MONO ABS: 0.9 10*3/uL (ref 0.1–1.0)
Monocytes Relative: 7 % (ref 3–12)
Neutro Abs: 8.3 10*3/uL — ABNORMAL HIGH (ref 1.7–7.7)
Neutrophils Relative %: 68 % (ref 43–77)
Platelets: 280 10*3/uL (ref 150–400)
RBC: 3.74 MIL/uL — AB (ref 3.87–5.11)
RDW: 26.7 % — ABNORMAL HIGH (ref 11.5–15.5)
WBC: 12.3 10*3/uL — ABNORMAL HIGH (ref 4.0–10.5)

## 2014-05-15 LAB — GLUCOSE, CAPILLARY
GLUCOSE-CAPILLARY: 93 mg/dL (ref 70–99)
Glucose-Capillary: 220 mg/dL — ABNORMAL HIGH (ref 70–99)
Glucose-Capillary: 200 mg/dL — ABNORMAL HIGH (ref 70–99)
Glucose-Capillary: 144 mg/dL — ABNORMAL HIGH (ref 70–99)
Glucose-Capillary: 132 mg/dL — ABNORMAL HIGH (ref 70–99)
Glucose-Capillary: 124 mg/dL — ABNORMAL HIGH (ref 70–99)
Glucose-Capillary: 107 mg/dL — ABNORMAL HIGH (ref 70–99)

## 2014-05-15 LAB — BLOOD GAS, ARTERIAL
Acid-Base Excess: 0.7 mmol/L (ref 0.0–2.0)
Bicarbonate: 26.3 mEq/L — ABNORMAL HIGH (ref 20.0–24.0)
Drawn by: 22179
FIO2: 100 %
O2 SAT: 99.3 %
PATIENT TEMPERATURE: 37
PH ART: 7.302 — AB (ref 7.350–7.450)
TCO2: 24.8 mmol/L (ref 0–100)
pCO2 arterial: 55 mmHg — ABNORMAL HIGH (ref 35.0–45.0)
pO2, Arterial: 174 mmHg — ABNORMAL HIGH (ref 80.0–100.0)

## 2014-05-15 LAB — STREP PNEUMONIAE URINARY ANTIGEN: Strep Pneumo Urinary Antigen: NEGATIVE

## 2014-05-15 LAB — CLOSTRIDIUM DIFFICILE BY PCR: Toxigenic C. Difficile by PCR: NEGATIVE

## 2014-05-15 LAB — MRSA PCR SCREENING: MRSA by PCR: POSITIVE — AB

## 2014-05-15 MED ORDER — SODIUM CHLORIDE 0.9 % IJ SOLN
3.0000 mL | Freq: Two times a day (BID) | INTRAMUSCULAR | Status: DC
  Administered 2014-05-15 – 2014-05-23 (×7): 3 mL via INTRAVENOUS

## 2014-05-15 MED ORDER — FUROSEMIDE 10 MG/ML IJ SOLN
120.0000 mg | Freq: Two times a day (BID) | INTRAVENOUS | Status: DC
  Administered 2014-05-15 – 2014-05-20 (×12): 120 mg via INTRAVENOUS
  Filled 2014-05-15 (×13): qty 12

## 2014-05-15 MED ORDER — VANCOMYCIN HCL IN DEXTROSE 1-5 GM/200ML-% IV SOLN
1000.0000 mg | INTRAVENOUS | Status: DC
  Administered 2014-05-16: 1000 mg via INTRAVENOUS
  Filled 2014-05-15: qty 200

## 2014-05-15 MED ORDER — LEVALBUTEROL HCL 0.63 MG/3ML IN NEBU
INHALATION_SOLUTION | RESPIRATORY_TRACT | Status: AC
  Filled 2014-05-15: qty 3

## 2014-05-15 MED ORDER — PIPERACILLIN-TAZOBACTAM IN DEX 2-0.25 GM/50ML IV SOLN
2.2500 g | Freq: Once | INTRAVENOUS | Status: AC
  Administered 2014-05-15: 2.25 g via INTRAVENOUS
  Filled 2014-05-15: qty 50

## 2014-05-15 MED ORDER — PIPERACILLIN-TAZOBACTAM IN DEX 2-0.25 GM/50ML IV SOLN
2.2500 g | Freq: Three times a day (TID) | INTRAVENOUS | Status: DC
  Administered 2014-05-15 – 2014-05-20 (×15): 2.25 g via INTRAVENOUS
  Filled 2014-05-15 (×16): qty 50

## 2014-05-15 MED ORDER — PIPERACILLIN-TAZOBACTAM 3.375 G IVPB: 3.3750 g | Freq: Once | INTRAVENOUS | Status: DC

## 2014-05-15 MED ORDER — SODIUM CHLORIDE 0.9 % IV SOLN
250.0000 mL | INTRAVENOUS | Status: DC | PRN
  Administered 2014-05-15: 250 mL via INTRAVENOUS

## 2014-05-15 MED ORDER — PIPERACILLIN-TAZOBACTAM 3.375 G IVPB
3.3750 g | Freq: Three times a day (TID) | INTRAVENOUS | Status: DC
Start: 2014-05-15 — End: 2014-05-15
  Filled 2014-05-15: qty 50

## 2014-05-15 MED ORDER — SODIUM CHLORIDE 0.9 % IJ SOLN
3.0000 mL | INTRAMUSCULAR | Status: DC | PRN
  Filled 2014-05-15: qty 3

## 2014-05-15 MED ORDER — VANCOMYCIN HCL IN DEXTROSE 1-5 GM/200ML-% IV SOLN: 1000.0000 mg | INTRAVENOUS | Status: DC

## 2014-05-15 MED ORDER — CETYLPYRIDINIUM CHLORIDE 0.05 % MT LIQD
7.0000 mL | Freq: Two times a day (BID) | OROMUCOSAL | Status: DC
  Administered 2014-05-15 – 2014-05-23 (×17): 7 mL via OROMUCOSAL

## 2014-05-15 MED ORDER — INSULIN ASPART 100 UNIT/ML ~~LOC~~ SOLN
0.0000 [IU] | SUBCUTANEOUS | Status: DC
  Administered 2014-05-15: 1 [IU] via SUBCUTANEOUS
  Administered 2014-05-15: 3 [IU] via SUBCUTANEOUS
  Administered 2014-05-15: 1 [IU] via SUBCUTANEOUS
  Administered 2014-05-15: 2 [IU] via SUBCUTANEOUS
  Administered 2014-05-15 – 2014-05-16 (×2): 1 [IU] via SUBCUTANEOUS
  Administered 2014-05-17 (×2): 2 [IU] via SUBCUTANEOUS
  Administered 2014-05-17: 1 [IU] via SUBCUTANEOUS
  Administered 2014-05-17: 2 [IU] via SUBCUTANEOUS
  Administered 2014-05-17: 1 [IU] via SUBCUTANEOUS
  Administered 2014-05-18 (×3): 3 [IU] via SUBCUTANEOUS
  Administered 2014-05-18: 2 [IU] via SUBCUTANEOUS
  Administered 2014-05-19: 7 [IU] via SUBCUTANEOUS
  Administered 2014-05-19 (×2): 5 [IU] via SUBCUTANEOUS
  Administered 2014-05-19: 3 [IU] via SUBCUTANEOUS
  Administered 2014-05-19 – 2014-05-20 (×3): 5 [IU] via SUBCUTANEOUS
  Administered 2014-05-20: 3 [IU] via SUBCUTANEOUS
  Administered 2014-05-20 (×3): 5 [IU] via SUBCUTANEOUS
  Administered 2014-05-21 (×2): 3 [IU] via SUBCUTANEOUS
  Administered 2014-05-21: 2 [IU] via SUBCUTANEOUS
  Administered 2014-05-21 – 2014-05-22 (×4): 3 [IU] via SUBCUTANEOUS
  Administered 2014-05-22: 5 [IU] via SUBCUTANEOUS
  Administered 2014-05-22 (×2): 3 [IU] via SUBCUTANEOUS
  Administered 2014-05-22: 2 [IU] via SUBCUTANEOUS
  Administered 2014-05-22: 3 [IU] via SUBCUTANEOUS

## 2014-05-15 MED ORDER — PIPERACILLIN-TAZOBACTAM IN DEX 2-0.25 GM/50ML IV SOLN
INTRAVENOUS | Status: AC
  Filled 2014-05-15: qty 50

## 2014-05-15 MED ORDER — LEVOFLOXACIN IN D5W 750 MG/150ML IV SOLN
750.0000 mg | Freq: Once | INTRAVENOUS | Status: AC
  Administered 2014-05-15: 750 mg via INTRAVENOUS
  Filled 2014-05-15: qty 150

## 2014-05-15 MED ORDER — CHLORHEXIDINE GLUCONATE CLOTH 2 % EX PADS
6.0000 | MEDICATED_PAD | Freq: Every day | CUTANEOUS | Status: AC
  Administered 2014-05-16 – 2014-05-19 (×4): 6 via TOPICAL

## 2014-05-15 MED ORDER — LEVOFLOXACIN IN D5W 750 MG/150ML IV SOLN: 750.0000 mg | INTRAVENOUS | Status: DC

## 2014-05-15 MED ORDER — LEVOFLOXACIN IN D5W 500 MG/100ML IV SOLN
500.0000 mg | INTRAVENOUS | Status: DC
  Filled 2014-05-15: qty 100

## 2014-05-15 MED ORDER — MUPIROCIN 2 % EX OINT
1.0000 "application " | TOPICAL_OINTMENT | Freq: Two times a day (BID) | CUTANEOUS | Status: AC
  Administered 2014-05-15 – 2014-05-19 (×10): 1 via NASAL
  Filled 2014-05-15: qty 22

## 2014-05-15 MED ORDER — LEVALBUTEROL HCL 0.63 MG/3ML IN NEBU
0.6300 mg | INHALATION_SOLUTION | Freq: Four times a day (QID) | RESPIRATORY_TRACT | Status: DC
  Administered 2014-05-15 – 2014-05-23 (×31): 0.63 mg via RESPIRATORY_TRACT
  Filled 2014-05-15 (×33): qty 3

## 2014-05-15 MED ORDER — COLLAGENASE 250 UNIT/GM EX OINT
TOPICAL_OINTMENT | Freq: Every day | CUTANEOUS | Status: DC
  Administered 2014-05-15 – 2014-05-23 (×10): via TOPICAL
  Filled 2014-05-15: qty 30

## 2014-05-15 NOTE — Progress Notes (Signed)
ANTIBIOTIC CONSULT NOTE   Pharmacy Consult for Vancomycin, Zosyn, Levaquin Indication: pneumonia  No Known Allergies  Patient Measurements: Height: 5\' 10"  (177.8 cm) Weight: 196 lb 6.9 oz (89.1 kg) IBW/kg (Calculated) : 68.5  Vital Signs: Temp: 98.4 F (36.9 C) (10/30 0754) Temp Source: Axillary (10/30 0754) BP: 113/67 mmHg (10/30 1000) Pulse Rate: 81 (10/30 1000) Intake/Output from previous day: 10/29 0701 - 10/30 0700 In: 519.3 [I.V.:19.3; IV Piggyback:400] Out: 275 [Urine:75; Stool:200] Intake/Output from this shift: Total I/O In: 50 [IV Piggyback:50] Out: -   Labs:  Recent Labs  05/14/14 2055 05/15/14 0151  WBC 11.3* 12.3*  HGB 10.4* 10.3*  PLT 283 280  CREATININE 1.73* 1.89*   Estimated Creatinine Clearance: 31.1 ml/min (by C-G formula based on Cr of 1.89). No results found for this basename: VANCOTROUGH, Leodis Binet, VANCORANDOM, GENTTROUGH, GENTPEAK, GENTRANDOM, TOBRATROUGH, TOBRAPEAK, TOBRARND, AMIKACINPEAK, AMIKACINTROU, AMIKACIN,  in the last 72 hours   Microbiology: Recent Results (from the past 720 hour(s))  CLOSTRIDIUM DIFFICILE BY PCR     Status: None   Collection Time    04/19/14  5:54 PM      Result Value Ref Range Status   C difficile by pcr NEGATIVE  NEGATIVE Final  CULTURE, BLOOD (ROUTINE X 2)     Status: None   Collection Time    05/15/14  1:21 AM      Result Value Ref Range Status   Specimen Description BLOOD LEFT ANTECUBITAL   Final   Special Requests BOTTLES DRAWN AEROBIC AND ANAEROBIC 6CC   Final   Culture PENDING   Incomplete   Report Status PENDING   Incomplete  CULTURE, BLOOD (ROUTINE X 2)     Status: None   Collection Time    05/15/14  1:21 AM      Result Value Ref Range Status   Specimen Description BLOOD LEFT ANTECUBITAL   Final   Special Requests BOTTLES DRAWN AEROBIC AND ANAEROBIC [facility] EACH   Final   Culture PENDING   Incomplete   Report Status PENDING   Incomplete  MRSA PCR SCREENING     Status: Abnormal   Collection Time     05/15/14  1:40 AM      Result Value Ref Range Status   MRSA by PCR POSITIVE (*) NEGATIVE Final   Comment:            The GeneXpert MRSA Assay (FDA     approved for NASAL specimens     only), is one component of a     comprehensive MRSA colonization     surveillance program. It is not     intended to diagnose MRSA     infection nor to guide or     monitor treatment for     MRSA infections.     RESULT CALLED TO, READ BACK BY AND VERIFIED WITH:     NEILSON T AT 0435 ON [id number] BY FORSYTH K    Anti-infectives   Start     Dose/Rate Route Frequency Ordered Stop   05/17/14 0300  levofloxacin (LEVAQUIN) IVPB 750 mg  Status:  Discontinued     750 mg 100 mL/hr over 90 Minutes Intravenous Every 48 hours 05/15/14 0746 05/15/14 1034   05/17/14 0300  levofloxacin (LEVAQUIN) IVPB 500 mg     500 mg 100 mL/hr over 60 Minutes Intravenous Every 48 hours 05/15/14 1034     05/16/14 1200  vancomycin (VANCOCIN) IVPB 1000 mg/200 mL premix     1,000 mg 200 mL/hr  over 60 Minutes Intravenous Every T-Th-Sa (Hemodialysis) 05/15/14 1034     05/15/14 2200  vancomycin (VANCOCIN) IVPB 1000 mg/200 mL premix  Status:  Discontinued     1,000 mg 200 mL/hr over 60 Minutes Intravenous Every 24 hours 05/15/14 0746 05/15/14 1034   05/15/14 1400  piperacillin-tazobactam (ZOSYN) IVPB 3.375 g  Status:  Discontinued     3.375 g 12.5 mL/hr over 240 Minutes Intravenous 3 times per day 05/15/14 0746 05/15/14 1034   05/15/14 1400  piperacillin-tazobactam (ZOSYN) IVPB 2.25 g     2.25 g 100 mL/hr over 30 Minutes Intravenous 3 times per day 05/15/14 1034     05/15/14 0700  piperacillin-tazobactam (ZOSYN) IVPB 3.375 g  Status:  Discontinued     3.375 g 12.5 mL/hr over 240 Minutes Intravenous  Once 05/15/14 0144 05/15/14 0144   05/15/14 0700  piperacillin-tazobactam (ZOSYN) IVPB 2.25 g     2.25 g 100 mL/hr over 30 Minutes Intravenous  Once 05/15/14 0145 05/15/14 0739   05/15/14 0300  levofloxacin (LEVAQUIN) IVPB 750 mg      750 mg 100 mL/hr over 90 Minutes Intravenous  Once 05/15/14 0204 05/15/14 0447   05/14/14 2200  piperacillin-tazobactam (ZOSYN) IVPB 3.375 g     3.375 g 100 mL/hr over 30 Minutes Intravenous  Once 05/14/14 2155 05/14/14 2311   05/14/14 2200  vancomycin (VANCOCIN) IVPB 1000 mg/200 mL premix     1,000 mg 200 mL/hr over 60 Minutes Intravenous  Once 05/14/14 2155 05/15/14 0016      Assessment: 73 yoF admitted from NH with worsening shortness of breath and altered mental status.  She has ESRD on HD T/T/S.  She was started on oral Levaquin prior to admission 10/29.  CXR + LLL PNA.   She is afebrile with mild leukocytosis.    Vancomyin 10/29>> Zosyn 10/20>> Levaquin 10/29>>  Goal of Therapy:  Pre-HD Vancomycin level 15-25 mcg/ml  Plan:  Zosyn 2.25gm IV q8h Levaquin 500mg  IV q48h Vancomycin 1gm IV qHD (TTS) Check pre-HD level at steady state Monitor patient progress & cx data *Consider d/c Levaquin since only providing duplicate gram negative coverage  Laverda Stribling, Mercy RidingAndrea Michelle 05/15/2014,10:38 AM

## 2014-05-15 NOTE — Progress Notes (Signed)
PROGRESS NOTE  [name] ZOX:[id number] DOB: [birth year] DOA: 05/14/2014 PCP: Mickle PlumbStone, Nancy, NP  Summary: 73yow hospitalized 01/25/2014-03/06/2014 with STEMI >> emergent CABG and MVR, post-op course prolonged vent, balloon pump, PEG placement, CVVHD >> HD, eventually transferred to LTAC then to [facility] last few weeks, developed diarrhea on pneumonia treated with Levaquin. Per staff has been declining, not eating, confused. Transferred to ED 10/29 for lethargy and hypoxia; diagnosed with HCAP, treated with BiPAP and admitted.  Diagnosed with pneumonia 11/29 and started on Levaquin   Assessment/Plan: 1. Acute hypoxic respiratory failure secondary to HCAP 2. Septic shock secondary to HCAP 3. HCAP 4. Acute encephalopathy. Noted to be confused August and September. Cardiology queried dementia. 5. Bilateral pleural effusions, not felt to large enough for therapeutic tap, see below 6. Chronic systolic CHF LVEF 40-45% 02/2014 7. History of STEMI, CABG x4, MVR 01/2014, cardiogenic shock, prolonged vent, [facility]. 8. Multiple wounds bilateral LE, s/p PEG 9. Diarrhea cdiff negative likely secondary to TF, abx. 10. DM type 2 11. PAD 12. Atrial fibrillation  13. ESRD on hemodialysis   Critically ill with septic shock secondary to HCAP with worsening respiratory failure now resp acidosis, ongoing encephalopathy superimposed on AKI/ESRD on ongoing HD. Prognosis poor, survival in question.  Discussed chest CT with Dr. Tyron RussellBoles radiology--RLL pneumonia, possiblly LLL, bilateral effusions appear too small for therapeutic tap and benefit would not be anticipated. Loculation pleural fluid LUL without evidence of infection/empyema, may be related to previous chest tube. Discussed case in detail with PCCM Dr. Kendrick FriesMcQuaid, reviewed chest CT. Given critical illness, comordities, FTT, anticoagulated state any tap would be technically difficult and it is not felt  likely to provide any therapeutic benefit. Therefore will continue current care.  Discussed in detail with grandson Ara KussmaulRichie Saperstein who is main family member involved and has applied for POA status. His family relies upon to make medical decisions. Discussed critical illness with him, he requests DNR/DNI, continue current medical interventions but if fails to improve would not want heroic measures.   Continue abx, Lasix per nephrology  Consult wound care  SSI Q4  Code Status: DNR/DNI DVT prophylaxis: on warfarin, INR therapeutic Family Communication:  Disposition Plan: pending  Brendia Sacksaniel Emilo Gras, MD  [facility]  Pager [phone] If 7PM-7AM, please contact night-coverage at [email or web address], password [email or web address] 05/15/2014, 10:06 AM  LOS: 1 day   Consultants:  Nephrology   Procedures:  Dialysis catheter in right subclavian present on admission   Antibiotics:  Vancomycin 10/30 >>  Zosyn 10/30 >>  Levaquin 10/30   HPI/Subjective: Nephrology plans Lasix today and dialysis tomorrow. Hypoxic per RN now on NRB. Minimally responsive since admission.  Arouses briefly, offers no history.  Objective: Filed Vitals:   05/15/14 0700 05/15/14 0754 05/15/14 0800 05/15/14 0847  BP: 114/58  86/61   Pulse: 66  66   Temp:  98.4 F (36.9 C)    TempSrc:  Axillary    Resp: 23  26   Height:      Weight:      SpO2: 100%  100% 100%    Intake/Output Summary (Last 24 hours) at 05/15/14 1006 Last data filed at 05/15/14 0709  Gross per 24 hour  Intake 569.33 ml  Output    275 ml  Net 294.33 ml     Filed Weights   05/15/14 0100  Weight: 89.1 kg (196 lb 6.9 oz)    Exam:  Afebrile, intermittent hypotension, hypoxic requiring NRB  General: appears critically ill, minimally interactive, only opens eyes to voice briefly.  Psych: as above, cannot otherwise assess  Eyes: PERRL, normal lids, irises  ENT: lips appear unremarkable  Neck: grossly unremarkable  CV: RRR no m/r/g.  1+ BLE edema.  Respiratory: CTA bilaterally, tachypneic, increased respiratory effort, no frank w/r/r. Normal respiratory effort.  Abdomen: soft, ntnd, no masses appreciated. PEG site appears unremarkable  Skin: multiple wounds--sacrum, near perineum (perineum itself unremarkable), both heels, both legs, pressure ulcers.   Musculoskeletal: pulses weak both feet but skin warm, dry with grossly normal perfusion bilaterally.  Neuro: unable to ascertain  Data Reviewed:  CBG stable  ABG on admission unremarkable, repeat with resp acidosis  Creatinine 1.73 >. 1.89  Lactate normal on admission as was procalcitonin  troponins negative   Chronic leukocytosis stable.   Anemia of chronic disease stable  INR 2.08  BC pending  CXR LLL consolidation worse than previous  CT head no acute changes  EKG SR, LBBB pattern, no acute changes  CT chest discussed with Dr. Christiana Pellant pneumonia, bilateral pleural effusions.   Scheduled Meds: . antiseptic oral rinse  7 mL Mouth Rinse BID  . [START ON 05/16/2014] Chlorhexidine Gluconate Cloth  6 each Topical Q0600  . furosemide  120 mg Intravenous BID  . insulin aspart  0-9 Units Subcutaneous 6 times per day  . levalbuterol  0.63 mg Nebulization Q6H  . [START ON 05/17/2014] levofloxacin (LEVAQUIN) IV  750 mg Intravenous Q48H  . mupirocin ointment  1 application Nasal BID  . piperacillin-tazobactam (ZOSYN)  IV  3.375 g Intravenous 3 times per day  . sodium chloride  3 mL Intravenous Q12H  . 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

## 2014-05-15 NOTE — Clinical Social Work Psychosocial (Signed)
Clinical Social Work Department BRIEF PSYCHOSOCIAL ASSESSMENT 05/15/2014  Patient:  [name], [name]     Account Number:  [id number]     Admit date:  05/14/2014  Clinical Social Worker:  Trilby Leaver  Date/Time:  05/15/2014 01:16 PM  Referred by:  CSW  Date Referred:  05/15/2014 Referred for  SNF Placement   Other Referral:   Interview type:  Patient Other interview type:   Patient  Larysa, Milani [phone]    PSYCHOSOCIAL DATA Living Status:  FACILITY Admitted from facility:  [facility] Level of care:  Skilled Nursing Facility Primary support name:  Lanee Mazzio Primary support relationship to patient:  FAMILY Degree of support available:   Limited support due to transportation issues with grandson's vehicle.    CURRENT CONCERNS Current Concerns  Post-Acute Placement   Other Concerns:    SOCIAL WORK ASSESSMENT / PLAN Patient was not able to participate in interview as she is disoriented x4.  CSW spoke with patient's daughter, Margrit Wigley.  Ms. Boden indicated that CSW needed to speak with patient's grandson, Geneen Ledesma.  CSW spoke with Ara Kussmaul. He indicated that patient has been at [facility] for about three weeks.  He stated that [facility] told him that patient has been sick for a week but he was just notified of her illness last night.  He indicated that he had visited with patient minimally since she went to the facility due to his car breaking down.  Mr. Neyer indicated that he had purchased another vehicle today and that he would be able to visit more frequently now.  He indicated that patient used a wheelchair.  Mr. Getson indicated that he has to take papers to the courthouse to get POA and guardianship of patient.  He indicated that he plans on taking the papers to the court house later today.  Mr. Neitz indicated that the last time he visited with patient at the facility, patient "kept hollering for her son who passed away."  He indicated that he has been  told that patient has Stage three Alzheimer's and Dementia.  Mr. Horigan indicated that he wanted patient to return to [facility] upon discharge for continued care.  CSW advised Mr. Digaetano to contact the ICU nurses station with any specific information that he desired regarding patient's condition.  CSW provided Mr. Larita Fife with the number to the ICU nurses station.  CSW spoke with Eunice Blase at [place]. Debbie confirmed patient's grandson's report.  She indicated that patient could come back to the facility upon discharge.   Assessment/plan status:  Information/Referral to [facility] Other assessment/ plan:   Information/referral to community resources:   [facility]    PATIENT'S/FAMILY'S RESPONSE TO PLAN OF CARE: Patient family and facility both agree for client to return to facility upon discharge.    Tretha Sciara, LCSW [phone]

## 2014-05-15 NOTE — H&P (Signed)
PCP:   Mickle Plumb, NP   Chief Complaint:  sob  HPI: 73 yo female recent h/o stemi/4v cabg/cardiogenic shock/prolonged vent/newly [facility] about 6 weeks ago was d/c to [facility] and is now at [facility] sent in here from [facility] for worsening sob and ams.  Pt was diagnosed with pna yesterday at [facility] and started on levaquin.  She received her dialysis today but unknown if that was a complete course or not.  Pt is more awake since arrival but still minimally responsive and appears fluid overloaded.  She opens her eyes and looks at various people in the room, looks towards voice but cannot follow simple commands.  She is in overall poor shape with multiple bed wounds, along with ischemic changes to her feet that are not new with h/o PVD.  No DNR paperwork sent with her from [facility].  Review of Systems:  Unobtainable from patient  Past Medical History: Past Medical History  Diagnosis Date  . History of MI (myocardial infarction)     PCI done @ [facility] (cannot get cath report on Care Everywhere(  . Essential hypertension   . TIA (transient ischemic attack)   . Thyroid disease   . Anxiety   . Diabetes mellitus type 2 with peripheral artery disease     On insulin  . PAD (peripheral artery disease)   . Hyperlipidemia associated with type 2 diabetes mellitus   . A-fib   . ARF (acute renal failure)    Past Surgical History  Procedure Laterality Date  . Cardiac catheterization      PCI - unknown vessel or stent; unknown date; @ [facility].  . Abdominal hysterectomy    . Cholecystectomy    . Coronary artery bypass graft N/A 01/25/2014    Procedure: CORONARY ARTERY BYPASS GRAFTING TIMES FOUR USING LEFT INTERNAL MAMMARY ARTERY TO LAD, SAPHENOUS VEIN GRAFTS TO OM1, OM2, AND PDA;  Surgeon: Kerin Perna, MD;  Location: [facility] OR;  Service: Open Heart Surgery;  Laterality: N/A;  . Mitral valve replacement N/A 01/25/2014    Procedure: MITRAL VALVE (MV) REPLACEMENT;  Surgeon:  Kerin Perna, MD;  Location: [facility] OR;  Service: Open Heart Surgery;  Laterality: N/A;  . Av fistula placement Right 04/09/2014    Procedure: ARTERIOVENOUS (AV) FISTULA CREATION;  Surgeon: Chuck Hint, MD;  Location: [facility] OR;  Service: Vascular;  Laterality: Right;    Medications: Prior to Admission medications   Medication Sig Start Date End Date Taking? Authorizing Provider  Amino Acids-Protein Hydrolys (FEEDING SUPPLEMENT, PRO-STAT SUGAR FREE 64,) LIQD Give 30 mLs by tube 3 (three) times daily.    Yes Historical Provider, MD  amiodarone (PACERONE) 200 MG tablet Give 200 mg by tube daily.   Yes Historical Provider, MD  aspirin EC 81 MG tablet Give 81 mg by tube daily.   Yes Historical Provider, MD  famotidine (PEPCID) 20 MG tablet Give 20 mg by tube 2 (two) times daily.   Yes Historical Provider, MD  fluconazole (DIFLUCAN) 150 MG tablet Take 150 mg by mouth daily.   Yes Historical Provider, MD  furosemide (LASIX) 20 MG tablet Give 20 mg by tube See admin instructions. Give 20mg  on Mon.,Wed.,Fri.,   Yes Historical Provider, MD  guaifenesin (ROBITUSSIN) 100 MG/5ML syrup Give 200 mg by tube 4 (four) times daily as needed for cough.    Yes Historical Provider, MD  Guar Gum (NUTRISOURCE FIBER) PACK Give 1 Package by tube daily.   Yes  Historical Provider, MD  insulin aspart (NOVOLOG) 100 UNIT/ML injection Inject 0-12 Units into the skin See admin instructions. Sliding scale BG below 70= 0 units; 70 - 150 give 0 units; 151- 200 give 2 units; 201- 250 give 4 units; 251- 300 give 6 units; 301- 350 give 8 units; 351 -400 give 10 units; for BG above 400 give 12 units and call doctor   Yes Historical Provider, MD  insulin detemir (LEVEMIR) 100 UNIT/ML injection Inject 10 Units into the skin 2 (two) times daily.    Yes Historical Provider, MD  levofloxacin (LEVAQUIN) 500 MG tablet Place 500 mg into feeding tube daily. For 6 days (starting 05/14/2014)   Yes Historical Provider, MD  levothyroxine  (SYNTHROID, LEVOTHROID) 88 MCG tablet Give 88 mcg by tube daily before breakfast.   Yes Historical Provider, MD  LORazepam (ATIVAN) 0.5 MG tablet Take 0.5 mg by mouth every 6 (six) hours as needed for anxiety.   Yes Historical Provider, MD  metoCLOPramide (REGLAN) 5 MG tablet Give 5 mg by tube every 6 (six) hours.   Yes Historical Provider, MD  midodrine (PROAMATINE) 5 MG tablet Give 5 mg by tube 3 (three) times daily with meals.   Yes Historical Provider, MD  mirtazapine (REMERON) 15 MG tablet Give 15 mg by tube at bedtime.   Yes Historical Provider, MD  potassium chloride 20 MEQ/15ML (10%) solution Give 20 mEq by tube 2 (two) times daily.    Yes Historical Provider, MD  sertraline (ZOLOFT) 50 MG tablet Give 50 mg by tube daily.   Yes Historical Provider, MD  sevelamer carbonate (RENVELA) 2.4 G PACK Give 2.4 g by tube daily.   Yes Historical Provider, MD  vitamin D, CHOLECALCIFEROL, 400 UNITS tablet Place 400 Units into feeding tube 2 (two) times daily.   Yes Historical Provider, MD  warfarin (COUMADIN) 2 MG tablet Take 2 mg by mouth daily.   Yes Historical Provider, MD  acetaminophen (TYLENOL) 325 MG tablet Give 650 mg by tube every 6 (six) hours as needed for moderate pain or fever.    Historical Provider, MD  acetaminophen (TYLENOL) 650 MG suppository Place 650 mg rectally every 6 (six) hours as needed for mild pain.    Historical Provider, MD  dextrose (GLUTOSE) 40 % GEL Give 1 Tube by tube as needed for low blood sugar.     Historical Provider, MD  dextrose 50 % solution Inject 11-25 mLs into the vein as needed for low blood sugar. Sliding scale: for low BG 25ml < 22; 23ml = 22-28; 21ml = 29-35; 19ml = 36-41; 17ml = 42-48; 15ml = 49- 55; 102ml= 56 - 61; 59ml= 62- 70; call md >70    Historical Provider, MD  glucagon, human recombinant, (GLUCAGEN DIAGNOSTIC) 1 MG injection Inject 1 mg into the vein once as needed for low blood sugar.    Historical Provider, MD  nitroGLYCERIN (NITROSTAT) 0.4 MG SL  tablet Place 0.4 mg under the tongue every 5 (five) minutes as needed for chest pain.    Historical Provider, MD    Allergies:  No Known Allergies  Social History:  reports that she has never smoked. She has never used smokeless tobacco. She reports that she does not drink alcohol or use illicit drugs.  Family History: Family History  Problem Relation Age of Onset  . Heart attack Mother   . Heart disease Mother   . Heart attack Father   . Heart disease Father     Physical Exam: Ceasar Mons  Vitals:   05/14/14 2130 05/14/14 2200 05/14/14 2230 05/14/14 2300  BP:  112/66 109/57 106/68  Pulse: 75 79 79 73  Temp:      TempSrc:      Resp: 26 27 16 28   SpO2: 99% 98% 97% 93%   General appearance: mild distress and slowed mentation Head: Normocephalic, without obvious abnormality, atraumatic Eyes: negative Nose: Nares normal. Septum midline. Mucosa normal. No drainage or sinus tenderness. Neck: no JVD and supple, symmetrical, trachea midline Lungs: rales bilaterally Heart: regular rate and rhythm, S1, S2 normal, no murmur, click, rub or gallop Abdomen: soft, non-tender; bowel sounds normal; no masses,  no organomegaly Extremities: extremities normal, atraumatic, no cyanosis or edema and necrotic wound to left lateral foot Pulses: decreased Skin: multiple decub wounds please see rn notes Neurologic: Cranial nerves: normal    Labs on Admission:   Recent Labs  05/14/14 2055  NA 135*  K 4.0  CL 98  CO2 24  GLUCOSE 288*  BUN 25*  CREATININE 1.73*  CALCIUM 8.1*    Recent Labs  05/14/14 2055  AST 26  ALT 13  ALKPHOS 124*  BILITOT 0.4  PROT 6.8  ALBUMIN 2.1*    Recent Labs  05/14/14 2055  WBC 11.3*  NEUTROABS 9.6*  HGB 10.4*  HCT 36.1  MCV 95.8  PLT 283    Recent Labs  05/14/14 2055  TROPONINI <0.30   Radiological Exams on Admission: Ct Head Wo Contrast  05/14/2014   CLINICAL DATA:  Altered mental status, respiratory distress  EXAM: CT HEAD WITHOUT  CONTRAST  TECHNIQUE: Contiguous axial images were obtained from the base of the skull through the vertex without intravenous contrast.  COMPARISON:  None.  FINDINGS: There is no evidence of mass effect, midline shift, or extra-axial fluid collections. There is no evidence of a space-occupying lesion or intracranial hemorrhage. There is no evidence of a cortical-based area of acute infarction. There is generalized cerebral atrophy. There is periventricular white matter low attenuation likely secondary to microangiopathy.  The ventricles and sulci are appropriate for the patient's age. The basal cisterns are patent.  Visualized portions of the orbits are unremarkable. Mild bilateral sphenoid sinus and right ethmoid sinus mucosal thickening. Small right mastoid effusion. Cerebrovascular atherosclerotic calcifications are noted.  The osseous structures are unremarkable.  IMPRESSION: 1. No acute intracranial pathology.   Electronically Signed   By: Elige Ko   On: 05/14/2014 22:55   Dg Chest Portable 1 View  05/14/2014   CLINICAL DATA:  Difficulty breathing and altered mental status ; acute renal failure  EXAM: PORTABLE CHEST - 1 VIEW  COMPARISON:  March 29, 2014  FINDINGS: There is consolidation in the left lower lobe with minimal left effusion. Lungs elsewhere clear. Heart is mildly enlarged with pulmonary vascularity within normal limits. No adenopathy. Central catheter tip is at the cavoatrial junction. No pneumothorax. Patient is status post mitral valve replacement.  IMPRESSION: Left lower lobe consolidation with small left effusion. Right lung clear. Heart prominent. Patient is status post mitral valve replacement.   Electronically Signed   By: Bretta Bang M.D.   On: 05/14/2014 20:58    Assessment/Plan  73 yo female with multiple medical problems including recent STEMI/coronary revascularization, recent institution of dialysis s/p cabg, recent prolonged ventilation, PVD now with  HCAP  Principal Problem:   HCAP (healthcare-associated pneumonia)-  Will broaden abx to include vancomycin and zosyn along with the levaquin due to her recent history within the last couple of months.  pna pathway.  She was briefly on bipap in the ED, but she appears more awake now, will cont on [place] at this time.  Will hold off on any ivf as she appears fluid overloaded at this time, please see below for that.  Obtain cultures including sputum cx.    Active Problems:     ST elevation myocardial infarction (STEMI) of inferolateral wall, initial episode of care 01/2014-  ekg today LBBB previous ekg do show incomplete lbbb and nonspecific ivc delay, trop neg.  Will cont to serial trop and reck ekg in am, but no evidence of coronary ischemia at this time.   Essential hypertension   PAD (peripheral artery disease)-  Has evidence of ischemic changes to her feet, however overall do not think patient is good candidate at this time for anything aggressive.  Will ck inr (suppose to be anticoagulated i believe) and if she is subtherapeutic will start heparin gtt.   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  Foley has been placed.   S/P MVR (mitral valve replacement) 2015  Presumptive full code.  Overall prognosis poor.  Admit to stepdown.  Railee Bonillas A 05/15/2014, 1:04 AM

## 2014-05-15 NOTE — Progress Notes (Signed)
UR chart review completed.  

## 2014-05-15 NOTE — Progress Notes (Signed)
Pt oxygen sats dropped to 60's; RT placed pt on NRB; pt's sats recovered to 100%. Will continue to monitor

## 2014-05-15 NOTE — Progress Notes (Addendum)
ANTIBIOTIC CONSULT NOTE-Preliminary  Pharmacy Consult for Zosyn, Vancomycin, Levaquin Indication: pneumonia  No Known Allergies  Patient Measurements:     Vital Signs: Temp: 98.9 F (37.2 C) (10/29 2101) Temp Source: Rectal (10/29 2101) BP: 106/68 mmHg (10/29 2300) Pulse Rate: 73 (10/29 2300)  Labs:  Recent Labs  05/14/14 2055  WBC 11.3*  HGB 10.4*  PLT 283  CREATININE 1.73*    The CrCl is unknown because both a height and weight (above a minimum accepted value) are required for this calculation.  No results found for this basename: VANCOTROUGH, Leodis Binet, VANCORANDOM, GENTTROUGH, GENTPEAK, GENTRANDOM, TOBRATROUGH, TOBRAPEAK, TOBRARND, AMIKACINPEAK, AMIKACINTROU, AMIKACIN,  in the last 72 hours   Microbiology: Recent Results (from the past 720 hour(s))  CLOSTRIDIUM DIFFICILE BY PCR     Status: None   Collection Time    04/19/14  5:54 PM      Result Value Ref Range Status   C difficile by pcr NEGATIVE  NEGATIVE Final    Medical History: Past Medical History  Diagnosis Date  . History of MI (myocardial infarction)     PCI done @ [facility] (cannot get cath report on Care Everywhere(  . Essential hypertension   . TIA (transient ischemic attack)   . Thyroid disease   . Anxiety   . Diabetes mellitus type 2 with peripheral artery disease     On insulin  . PAD (peripheral artery disease)   . Hyperlipidemia associated with type 2 diabetes mellitus   . A-fib   . ARF (acute renal failure)     Medications:  Scheduled:  . insulin aspart  0-9 Units Subcutaneous 6 times per day  . sodium chloride  3 mL Intravenous Q12H    Assessment: 73 yo female diagnosed with PNA, recent Hx stemi. Pt receiving dialysis.  Abx include vancomycin, Zosyn and Levofloxacin. Pt received vancomycin 1 gram and Zosyn 3.375 grams in ED.  Levofloxacin 500 mg PO (via tube) started in SNF on 10/29.  Dialysis scheduled for AM 10/30.   Plan:  Preliminary review of pertinent patient  information completed.  Protocol will be initiated with a one-time dose(s) of levofloxacin 750 mg IV load,  vancomycin (given in ED), Zosyn (given in ED and one dose scheduled for 0700 on 10/30).   Jeani Hawking clinical pharmacist will complete review during morning rounds to assess patient and finalize treatment regimen.  Neita Landrigan Scarlett, RPH 05/15/2014,1:40 AM

## 2014-05-15 NOTE — Progress Notes (Signed)
Placed on 3lpm/[place] for now.

## 2014-05-15 NOTE — Care Management Note (Addendum)
    Page 1 of 1   05/22/2014     2:25:09 PM CARE MANAGEMENT NOTE 05/22/2014  Patient:  [name], [name]   Account Number:  [id number]  Date Initiated:  05/15/2014  Documentation initiated by:  Sharrie Rothman  Subjective/Objective Assessment:   Pt admitted from [facility] with pneumonia. Pt will return to facility when medically stable.     Action/Plan:   CSW to arrange discharge to facility when medically stable.   Anticipated DC Date:  05/22/2014   Anticipated DC Plan:  SKILLED NURSING FACILITY  In-house referral  Clinical Social Worker      DC Planning Services  CM consult      Choice offered to / List presented to:             Status of service:  Completed, signed off Medicare Important Message given?  YES (If response is "NO", the following Medicare IM given date fields will be blank) Date Medicare IM given:  05/22/2014 Medicare IM given by:  Sharrie Rothman Date Additional Medicare IM given:   Additional Medicare IM given by:    Discharge Disposition:  HOSPICE MEDICAL FACILITY  Per UR Regulation:    If discussed at Long Length of Stay Meetings, dates discussed:   05/19/2014  05/21/2014    Comments:  05/22/14 1420 Arlyss Queen, RN BSN CM Family waiting to talk with [facility]. Potiential discharge to inpatient hospice over the weekend.  05/15/14 1115 Arlyss Queen, RN BSN CM

## 2014-05-15 NOTE — Consult Note (Addendum)
WOC wound consult note Reason for Consult: Consult requested for multiple wounds.  Pt has several systemic factors which can impair healing including decreased albumin, immobility, poor nutritional intake. Consult performed via remote camera with bedside nurse assistance. Wound type: Left heel 4X6.5 cm dark purple deep tissue injury Right heel 5X5.5cm dark purple deep tissue injury Right anterior foot 1X1.5cm dark purple deep tissue injury Right posterior leg full thickness stasis ulcer, 2.5X.2X.2cm, 100% yellow, mod amt yellow drainage, no odor Coccyx stage 3; 6.5X1X.3cm, 100% red, small amt yellow drainage, no odor. Right ischium unstageable; 5X5.5X.5cm, 100% slough, small amt yellow drainage, no odor Bilat upper buttocks unstageable; 2 separate areas which are next to each other; total area 10X7.5cm, 20% red, 10% yellow, 70% black eschar.  Mod amt tan drainage and some odor. Pressure Ulcer POA: Yes Drainage (amount, consistency, odor)  Periwound: Bialt buttocks red and multiple patchy areas of partial thickness skin loss; appearance consistent with moisture associated skin damage. Dressing procedure/placement/frequency: Foam dressings to protect bilat heels and coccyx and right posterior leg, float heels to reduce pressure.  Santyl ointment to chemically debride nonviable tissue to right ischium and buttocks wounds.  If aggressive plan of care is desired, then please consult surgical team for possible debridement of nonviable tissue.  Air mattress to reduce pressure, optimize nutrition.   Please re-consult if further assistance is needed.  Thank-you,  Cammie Mcgee MSN, RN, CWOCN, [place], CNS [phone]

## 2014-05-15 NOTE — Progress Notes (Signed)
Inpatient Diabetes Program Recommendations  AACE/ADA: New Consensus Statement on Inpatient Glycemic Control (2013)  Target Ranges:  Prepandial:   less than 140 mg/dL      Peak postprandial:   less than 180 mg/dL (1-2 hours)      Critically ill patients:  140 - 180 mg/dL   Results for BELLAMIE, CAPOTOSTO (MRN [id number]) as of 05/15/2014 10:05  Ref. Range 05/15/2014 02:05 05/15/2014 05:01 05/15/2014 07:49  Glucose-Capillary Latest Range: 70-99 mg/dL 984 (H) 210 (H) 312 (H)   Reason for Visit: SOB  Diabetes history: Type 2, A1C 6.4% on 04/10/14 Outpatient Diabetes medications: Novolog pre-meals, dose based on blood sugars, Levemir 10 units bid  Current orders for Inpatient glycemic control: Novolog 0-9 units q4h  FBS good this am but patient will likely require basal insulin as she does at home.  Please consider starting with 9 units Levemir at hs today.    Susette Racer, RN, BA, MHA, CDE Diabetes Coordinator Inpatient Diabetes Program  [phone] (Team Pager) [phone] [facility] Office) 05/15/2014 10:11 AM

## 2014-05-15 NOTE — Progress Notes (Signed)
INITIAL NUTRITION ASSESSMENT  DOCUMENTATION CODES Per approved criteria  -Not Applicable   INTERVENTION:   If tube feeding becomes an option: Recommend continuous Vital High Protein @ 40 ml/hr via PEG and increase by 10 ml every 6 hours to goal rate of 60 ml/hr.   Tube feeding regimen provides 1440 kcal,  125 gr of protein, and 1203 ml of H2O.    NUTRITION DIAGNOSIS: Increased protein-energy needs related to wound healing as evidenced by current nutrition  guidelines.   Goal: Meet nutrition requirements based on pt care decisions  Monitor: nutrition support, skin assessments, labs and weight changes   Reason for Assessment: Malnutrition Screen Score =  4, and Low Br[place]   73 y.o. female  Admitting Dx: HCAP (healthcare-associated pneumonia)  ASSESSMENT: Pt is from [facility]. HX of MI, HTN, TIA, Thyriod dz, PAD, CABG, CHF, DM type 2 (04/10/14-A1C% 6.4). PEG (20 Fr.) placed during hospitalization in August.  Pt has HCAP with septic shock, acute encephalopathy, diarrhea, multiple wounds and ESRD with HD which was initiated in August. Family does not want "heroic" measures medically if pt does is not getting better.    Height: Ht Readings from Last 1 Encounters:  05/15/14 5\' 10"  (1.778 m)    Weight: Wt Readings from Last 1 Encounters:  05/15/14 196 lb 6.9 oz (89.1 kg)    Ideal Body Weight: 150# (68 kg)  % Ideal Body Weight: 131%  Wt Readings from Last 10 Encounters:  05/15/14 196 lb 6.9 oz (89.1 kg)  03/06/14 189 lb 9.5 oz (86 kg)  03/06/14 189 lb 9.5 oz (86 kg)  03/06/14 189 lb 9.5 oz (86 kg)  03/06/14 189 lb 9.5 oz (86 kg)    Usual Body Weight: 190#  % Usual Body Weight: 103%  BMI:  Body mass index is 28.18 kg/(m^2). overweight  Estimated Nutritional Needs: Kcal: 1500 Protein: 115-133 gr Fluid: > 1500 ml daily  Skin: multiple wounds: right heel deep tissue injury, left heel deep tissue injury,  unstagable to buttocks and right ischium, right anterior foot deep  tissue injury  Diet Order: NPO  EDUCATION NEEDS: -No education needs identified at this time   Intake/Output Summary (Last 24 hours) at 05/15/14 1627 Last data filed at 05/15/14 1500  Gross per 24 hour  Intake 669.33 ml  Output    275 ml  Net 394.33 ml    Last BM: 10/30 rectal tube  Labs:   Recent Labs Lab 05/14/14 2055 05/15/14 0151  NA 135* 136*  K 4.0 3.8  CL 98 97  CO2 24 26  BUN 25* 29*  CREATININE 1.73* 1.89*  CALCIUM 8.1* 8.2*  GLUCOSE 288* 241*    CBG (last 3)   Recent Labs  05/15/14 0501 05/15/14 0749 05/15/14 1202  GLUCAP 200* 144* 132*    Scheduled Meds: . antiseptic oral rinse  7 mL Mouth Rinse BID  . [START ON 05/16/2014] Chlorhexidine Gluconate Cloth  6 each Topical Q0600  . collagenase   Topical Daily  . furosemide  120 mg Intravenous BID  . insulin aspart  0-9 Units Subcutaneous 6 times per day  . levalbuterol  0.63 mg Nebulization Q6H  . mupirocin ointment  1 application Nasal BID  . piperacillin-tazobactam (ZOSYN)  IV  2.25 g Intravenous 3 times per day  . sodium chloride  3 mL Intravenous Q12H  . [START ON 05/16/2014] vancomycin  1,000 mg Intravenous Q T,Th,Sa-HD    Continuous Infusions:   Past Medical History  Diagnosis Date  .  History of MI (myocardial infarction)     PCI done @ [facility] (cannot get cath report on Care Everywhere(  . Essential hypertension   . TIA (transient ischemic attack)   . Thyroid disease   . Anxiety   . Diabetes mellitus type 2 with peripheral artery disease     On insulin  . PAD (peripheral artery disease)   . Hyperlipidemia associated with type 2 diabetes mellitus   . A-fib   . ARF (acute renal failure)     Past Surgical History  Procedure Laterality Date  . Cardiac catheterization      PCI - unknown vessel or stent; unknown date; @ [facility].  . Abdominal hysterectomy    . Cholecystectomy    . Coronary artery bypass graft N/A 01/25/2014    Procedure: CORONARY ARTERY BYPASS GRAFTING  TIMES FOUR USING LEFT INTERNAL MAMMARY ARTERY TO LAD, SAPHENOUS VEIN GRAFTS TO OM1, OM2, AND PDA;  Surgeon: Kerin Perna, MD;  Location: [facility] OR;  Service: Open Heart Surgery;  Laterality: N/A;  . Mitral valve replacement N/A 01/25/2014    Procedure: MITRAL VALVE (MV) REPLACEMENT;  Surgeon: Kerin Perna, MD;  Location: [facility] OR;  Service: Open Heart Surgery;  Laterality: N/A;  . Av fistula placement Right 04/09/2014    Procedure: ARTERIOVENOUS (AV) FISTULA CREATION;  Surgeon: Chuck Hint, MD;  Location: [facility] OR;  Service: Vascular;  Laterality: Right;    Royann Shivers MS,RD,CSG,LDN Office: [phone] Pager: [phone]

## 2014-05-15 NOTE — Consult Note (Signed)
Reason for Consult: End-stage renal disease Referring Physician: Dr. Flora Lipps [name] is an 73 y.o. female.  HPI: She is a patient who has recent MI, history of mitral valve replacement, arterial fibrillation and acute kidney injury started on hemodialysis recently. Patient was dialyzed yesterday but sent to the emergency room because of altered mental status and difficulty in breathing. When she was evaluated patient was found to have pneumonia and submitted to intensive care unit. Presently very difficult for me to evaluate her mental status as patient is new.  Past Medical History  Diagnosis Date  . History of MI (myocardial infarction)     PCI done @ [facility] (cannot get cath report on Care Everywhere(  . Essential hypertension   . TIA (transient ischemic attack)   . Thyroid disease   . Anxiety   . Diabetes mellitus type 2 with peripheral artery disease     On insulin  . PAD (peripheral artery disease)   . Hyperlipidemia associated with type 2 diabetes mellitus   . A-fib   . ARF (acute renal failure)     Past Surgical History  Procedure Laterality Date  . Cardiac catheterization      PCI - unknown vessel or stent; unknown date; @ [facility].  . Abdominal hysterectomy    . Cholecystectomy    . Coronary artery bypass graft N/A 01/25/2014    Procedure: CORONARY ARTERY BYPASS GRAFTING TIMES FOUR USING LEFT INTERNAL MAMMARY ARTERY TO LAD, SAPHENOUS VEIN GRAFTS TO OM1, OM2, AND PDA;  Surgeon: Ivin Poot, MD;  Location: [place];  Service: Open Heart Surgery;  Laterality: N/A;  . Mitral valve replacement N/A 01/25/2014    Procedure: MITRAL VALVE (MV) REPLACEMENT;  Surgeon: Ivin Poot, MD;  Location: [facility] [place];  Service: Open Heart Surgery;  Laterality: N/A;  . Av fistula placement Right 04/09/2014    Procedure: ARTERIOVENOUS (AV) FISTULA CREATION;  Surgeon: Angelia Mould, MD;  Location: [facility] OR;  Service: Vascular;  Laterality: Right;    Family History  Problem  Relation Age of Onset  . Heart attack Mother   . Heart disease Mother   . Heart attack Father   . Heart disease Father     Social History:  reports that she has never smoked. She has never used smokeless tobacco. She reports that she does not drink alcohol or use illicit drugs.  Allergies: No Known Allergies  Medications: I have reviewed the patient's current medications.  Results for orders placed during the hospital encounter of 05/14/14 (from the past 48 hour(s))  PRO B NATRIURETIC PEPTIDE     Status: Abnormal   Collection Time    05/14/14  8:28 PM      Result Value Ref Range   Pro B Natriuretic peptide (BNP) 31667.0 (*) 0 - 450 pg/mL  BLOOD GAS, ARTERIAL     Status: Abnormal   Collection Time    05/14/14  8:30 PM      Result Value Ref Range   FIO2 0.35     Delivery systems BILEVEL POSITIVE AIRWAY PRESSURE     Rate 10     Inspiratory PAP 10     Expiratory PAP 5     pH, Arterial 7.378  7.350 - 7.450   pCO2 arterial 44.8  35.0 - 45.0 mmHg   pO2, Arterial 120.0 (*) 80.0 - 100.0 mmHg   Bicarbonate 25.8 (*) 20.0 - 24.0 mEq/L   TCO2 23.8  0 - 100 mmol/L   Acid-Base  Excess 1.2  0.0 - 2.0 mmol/L   O2 Saturation 98.4     Patient temperature 37.0     Collection site LEFT RADIAL     Drawn by [phone]     Sample type ARTERIAL DRAW     Allens test (pass/fail) PASS  PASS  CBC WITH DIFFERENTIAL     Status: Abnormal   Collection Time    05/14/14  8:55 PM      Result Value Ref Range   WBC 11.3 (*) 4.0 - 10.5 K/uL   RBC 3.77 (*) 3.87 - 5.11 MIL/uL   Hemoglobin 10.4 (*) 12.0 - 15.0 g/dL   HCT 36.1  36.0 - 46.0 %   MCV 95.8  78.0 - 100.0 fL   MCH 27.6  26.0 - 34.0 pg   MCHC 28.8 (*) 30.0 - 36.0 g/dL   RDW 26.5 (*) 11.5 - 15.5 %   Platelets 283  150 - 400 K/uL   Neutrophils Relative % 85 (*) 43 - 77 %   Lymphocytes Relative 8 (*) 12 - 46 %   Monocytes Relative 7  3 - 12 %   Eosinophils Relative 0  0 - 5 %   Basophils Relative 0  0 - 1 %   Neutro Abs 9.6 (*) 1.7 - 7.7 K/uL    Lymphs Abs 0.9  0.7 - 4.0 K/uL   Monocytes Absolute 0.8  0.1 - 1.0 K/uL   Eosinophils Absolute 0.0  0.0 - 0.7 K/uL   Basophils Absolute 0.0  0.0 - 0.1 K/uL   RBC Morphology POLYCHROMASIA PRESENT     Comment: STOMATOCYTES     ELLIPTOCYTES  COMPREHENSIVE METABOLIC PANEL     Status: Abnormal   Collection Time    05/14/14  8:55 PM      Result Value Ref Range   Sodium 135 (*) 137 - 147 mEq/L   Potassium 4.0  3.7 - 5.3 mEq/L   Chloride 98  96 - 112 mEq/L   CO2 24  19 - 32 mEq/L   Glucose, Bld 288 (*) 70 - 99 mg/dL   BUN 25 (*) 6 - 23 mg/dL   Creatinine, Ser 1.73 (*) 0.50 - 1.10 mg/dL   Calcium 8.1 (*) 8.4 - 10.5 mg/dL   Total Protein 6.8  6.0 - 8.3 g/dL   Albumin 2.1 (*) 3.5 - 5.2 g/dL   AST 26  0 - 37 U/L   Comment: SLIGHT HEMOLYSIS   ALT 13  0 - 35 U/L   Alkaline Phosphatase 124 (*) 39 - 117 U/L   Total Bilirubin 0.4  0.3 - 1.2 mg/dL   GFR calc non Af Amer 28 (*) >90 mL/min   GFR calc Af Amer 32 (*) >90 mL/min   Comment: (NOTE)     The eGFR has been calculated using the CKD EPI equation.     This calculation has not been validated in all clinical situations.     eGFR's persistently <90 mL/min signify possible Chronic Kidney     Disease.   Anion gap 13  5 - 15  TROPONIN I     Status: None   Collection Time    05/14/14  8:55 PM      Result Value Ref Range   Troponin I <0.30  <0.30 ng/mL   Comment:            Due to the release kinetics of cTnI,     a negative result within the first hours  of the onset of symptoms does not rule out     myocardial infarction with certainty.     If myocardial infarction is still suspected,     repeat the test at appropriate intervals.  LACTIC ACID, PLASMA     Status: None   Collection Time    05/14/14 10:05 PM      Result Value Ref Range   Lactic Acid, Venous 2.0  0.5 - 2.2 mmol/L  CULTURE, BLOOD (ROUTINE X 2)     Status: None   Collection Time    05/15/14  1:21 AM      Result Value Ref Range   Specimen Description BLOOD LEFT  ANTECUBITAL     Special Requests BOTTLES DRAWN AEROBIC AND ANAEROBIC 6CC     Culture PENDING     Report Status PENDING    CULTURE, BLOOD (ROUTINE X 2)     Status: None   Collection Time    05/15/14  1:21 AM      Result Value Ref Range   Specimen Description BLOOD LEFT ANTECUBITAL     Special Requests BOTTLES DRAWN AEROBIC AND ANAEROBIC [place] EACH     Culture PENDING     Report Status PENDING    MRSA PCR SCREENING     Status: Abnormal   Collection Time    05/15/14  1:40 AM      Result Value Ref Range   MRSA by PCR POSITIVE (*) NEGATIVE   Comment:            The GeneXpert MRSA Assay (FDA     approved for NASAL specimens     only), is one component of a     comprehensive MRSA colonization     surveillance program. It is not     intended to diagnose MRSA     infection nor to guide or     monitor treatment for     MRSA infections.     RESULT CALLED TO, READ BACK BY AND VERIFIED WITH:     NEILSON T AT 0435 ON [id number] BY FORSYTH K  CBC WITH DIFFERENTIAL     Status: Abnormal   Collection Time    05/15/14  1:51 AM      Result Value Ref Range   WBC 12.3 (*) 4.0 - 10.5 K/uL   RBC 3.74 (*) 3.87 - 5.11 MIL/uL   Hemoglobin 10.3 (*) 12.0 - 15.0 g/dL   HCT 35.6 (*) 36.0 - 46.0 %   MCV 95.2  78.0 - 100.0 fL   MCH 27.5  26.0 - 34.0 pg   MCHC 28.9 (*) 30.0 - 36.0 g/dL   RDW 26.7 (*) 11.5 - 15.5 %   Platelets 280  150 - 400 K/uL   Neutrophils Relative % 68  43 - 77 %   Lymphocytes Relative 24  12 - 46 %   Monocytes Relative 7  3 - 12 %   Eosinophils Relative 1  0 - 5 %   Basophils Relative 0  0 - 1 %   Neutro Abs 8.3 (*) 1.7 - 7.7 K/uL   Lymphs Abs 3.0  0.7 - 4.0 K/uL   Monocytes Absolute 0.9  0.1 - 1.0 K/uL   Eosinophils Absolute 0.1  0.0 - 0.7 K/uL   Basophils Absolute 0.0  0.0 - 0.1 K/uL   RBC Morphology POLYCHROMASIA PRESENT     Comment: TEARDROP CELLS     STOMATOCYTES     SPHEROCYTES     ELLIPTOCYTES  BASIC  METABOLIC PANEL     Status: Abnormal   Collection Time    05/15/14   1:51 AM      Result Value Ref Range   Sodium 136 (*) 137 - 147 mEq/L   Potassium 3.8  3.7 - 5.3 mEq/L   Chloride 97  96 - 112 mEq/L   CO2 26  19 - 32 mEq/L   Glucose, Bld 241 (*) 70 - 99 mg/dL   BUN 29 (*) 6 - 23 mg/dL   Creatinine, Ser 1.89 (*) 0.50 - 1.10 mg/dL   Calcium 8.2 (*) 8.4 - 10.5 mg/dL   GFR calc non Af Amer 25 (*) >90 mL/min   GFR calc Af Amer 29 (*) >90 mL/min   Comment: (NOTE)     The eGFR has been calculated using the CKD EPI equation.     This calculation has not been validated in all clinical situations.     eGFR's persistently <90 mL/min signify possible Chronic Kidney     Disease.   Anion gap 13  5 - 15  PROTIME-INR     Status: Abnormal   Collection Time    05/15/14  1:51 AM      Result Value Ref Range   Prothrombin Time 23.6 (*) 11.6 - 15.2 seconds   INR 2.08 (*) 0.00 - 1.49  TROPONIN I     Status: None   Collection Time    05/15/14  1:51 AM      Result Value Ref Range   Troponin I <0.30  <0.30 ng/mL   Comment:            Due to the release kinetics of cTnI,     a negative result within the first hours     of the onset of symptoms does not rule out     myocardial infarction with certainty.     If myocardial infarction is still suspected,     repeat the test at appropriate intervals.  GLUCOSE, CAPILLARY     Status: Abnormal   Collection Time    05/15/14  2:05 AM      Result Value Ref Range   Glucose-Capillary 220 (*) 70 - 99 mg/dL  GLUCOSE, CAPILLARY     Status: Abnormal   Collection Time    05/15/14  5:01 AM      Result Value Ref Range   Glucose-Capillary 200 (*) 70 - 99 mg/dL  GLUCOSE, CAPILLARY     Status: Abnormal   Collection Time    05/15/14  7:49 AM      Result Value Ref Range   Glucose-Capillary 144 (*) 70 - 99 mg/dL   Comment 1 Documented in Chart     Comment 2 Notify RN      Ct Head Wo Contrast  05/14/2014   CLINICAL DATA:  Altered mental status, respiratory distress  EXAM: CT HEAD WITHOUT CONTRAST  TECHNIQUE: Contiguous axial  images were obtained from the base of the skull through the vertex without intravenous contrast.  COMPARISON:  None.  FINDINGS: There is no evidence of mass effect, midline shift, or extra-axial fluid collections. There is no evidence of a space-occupying lesion or intracranial hemorrhage. There is no evidence of a cortical-based area of acute infarction. There is generalized cerebral atrophy. There is periventricular white matter low attenuation likely secondary to microangiopathy.  The ventricles and sulci are appropriate for the patient's age. The basal cisterns are patent.  Visualized portions of the orbits are unremarkable. Mild bilateral sphenoid sinus and  right ethmoid sinus mucosal thickening. Small right mastoid effusion. Cerebrovascular atherosclerotic calcifications are noted.  The osseous structures are unremarkable.  IMPRESSION: 1. No acute intracranial pathology.   Electronically Signed   By: Kathreen Devoid   On: 05/14/2014 22:55   Dg Chest Portable 1 View  05/14/2014   CLINICAL DATA:  Difficulty breathing and altered mental status ; acute renal failure  EXAM: PORTABLE CHEST - 1 VIEW  COMPARISON:  March 29, 2014  FINDINGS: There is consolidation in the left lower lobe with minimal left effusion. Lungs elsewhere clear. Heart is mildly enlarged with pulmonary vascularity within normal limits. No adenopathy. Central catheter tip is at the cavoatrial junction. No pneumothorax. Patient is status post mitral valve replacement.  IMPRESSION: Left lower lobe consolidation with small left effusion. Right lung clear. Heart prominent. Patient is status post mitral valve replacement.   Electronically Signed   By: Lowella Grip M.D.   On: 05/14/2014 20:58    Review of Systems  Unable to perform ROS: mental acuity   Blood pressure 86/61, pulse 66, temperature 98.4 F (36.9 C), temperature source Axillary, resp. rate 26, height 5' 10"  (1.778 m), weight 89.1 kg (196 lb 6.9 oz), SpO2 100.00%. Physical  Exam  Constitutional: No distress.  Eyes: No scleral icterus.  Cardiovascular: Normal rate.   Respiratory: She has wheezes. She has rales.  GI: She exhibits no distension. There is no tenderness.  Musculoskeletal: She exhibits no edema.  Neurological:  Patient very sleepy but arousable. Presently patient doesn't answer to any question.    Assessment/Plan: Problem #1 end-stage renal disease: Patient is status post hemodialysis yesterday. Her BUN and creatinine is low. She has normal potassium. From her chart patient seems to have acute kidney injury and started on dialysis as inpatient. Presently transfer to outpatient unit to continue with her dialysis.  Problem #2 difficulty in breathing: Most likely from the left lower lobe pneumonia. Chest x-ray seems to be clear. Also patient was dialyzed yesterday    presently patient with intermittent hypotension. Problems for 3 diabetes Problem #4    coronary artery disease: She is status post ST elevation MI and history of CABG. Presently her cardiac enzymes are normal. Problem #5 mitral valve replacement    Problem #6 anemia. Plan: We'll make arrangements for patient to get dialysis tomorrow We'll give her Lasix 120 mg IV twice a day as patient has history of acute kidney injury and possibly might have some renal function. We'll check her basic metabolic panel, phosphorus and CBC in the morning.                                                                Naz Denunzio S 05/15/2014, 8:36 AM

## 2014-05-16 DIAGNOSIS — J90 Pleural effusion, not elsewhere classified: Secondary | ICD-10-CM

## 2014-05-16 DIAGNOSIS — J9601 Acute respiratory failure with hypoxia: Secondary | ICD-10-CM

## 2014-05-16 DIAGNOSIS — R6521 Severe sepsis with septic shock: Secondary | ICD-10-CM

## 2014-05-16 DIAGNOSIS — A419 Sepsis, unspecified organism: Secondary | ICD-10-CM

## 2014-05-16 DIAGNOSIS — G9340 Encephalopathy, unspecified: Secondary | ICD-10-CM

## 2014-05-16 LAB — BASIC METABOLIC PANEL
Anion gap: 20 — ABNORMAL HIGH (ref 5–15)
BUN: 39 mg/dL — AB (ref 6–23)
CO2: 24 mEq/L (ref 19–32)
Calcium: 7.9 mg/dL — ABNORMAL LOW (ref 8.4–10.5)
Chloride: 90 mEq/L — ABNORMAL LOW (ref 96–112)
Creatinine, Ser: 2.66 mg/dL — ABNORMAL HIGH (ref 0.50–1.10)
GFR calc Af Amer: 19 mL/min — ABNORMAL LOW (ref >90)
GFR, EST NON AFRICAN AMERICAN: 16 mL/min — AB (ref >90)
Glucose, Bld: 235 mg/dL — ABNORMAL HIGH (ref 70–99)
Potassium: 4.2 mEq/L (ref 3.7–5.3)
SODIUM: 134 meq/L — AB (ref 137–147)

## 2014-05-16 LAB — CBC
HCT: 33.6 % — ABNORMAL LOW (ref 36.0–46.0)
Hemoglobin: 9.6 g/dL — ABNORMAL LOW (ref 12.0–15.0)
MCH: 27.6 pg (ref 26.0–34.0)
MCHC: 28.6 g/dL — AB (ref 30.0–36.0)
MCV: 96.6 fL (ref 78.0–100.0)
PLATELETS: 281 10*3/uL (ref 150–400)
RBC: 3.48 MIL/uL — ABNORMAL LOW (ref 3.87–5.11)
RDW: 27.1 % — AB (ref 11.5–15.5)
WBC: 9.9 10*3/uL (ref 4.0–10.5)

## 2014-05-16 LAB — GLUCOSE, CAPILLARY
GLUCOSE-CAPILLARY: 98 mg/dL (ref 70–99)
Glucose-Capillary: 112 mg/dL — ABNORMAL HIGH (ref 70–99)
Glucose-Capillary: 121 mg/dL — ABNORMAL HIGH (ref 70–99)
Glucose-Capillary: 122 mg/dL — ABNORMAL HIGH (ref 70–99)
Glucose-Capillary: 130 mg/dL — ABNORMAL HIGH (ref 70–99)

## 2014-05-16 LAB — PHOSPHORUS: Phosphorus: 4.4 mg/dL (ref 2.3–4.6)

## 2014-05-16 MED ORDER — VITAL HIGH PROTEIN PO LIQD
1000.0000 mL | ORAL | Status: DC
  Administered 2014-05-16 – 2014-05-17 (×3): 1000 mL
  Administered 2014-05-17: 19:00:00
  Administered 2014-05-18 – 2014-05-23 (×7): 1000 mL
  Filled 2014-05-16 (×11): qty 1000

## 2014-05-16 MED ORDER — LIDOCAINE HCL (PF) 1 % IJ SOLN: 5.0000 mL | INTRAMUSCULAR | Status: DC | PRN

## 2014-05-16 MED ORDER — NEPRO/CARBSTEADY PO LIQD: 237.0000 mL | ORAL | Status: DC | PRN

## 2014-05-16 MED ORDER — LIDOCAINE-PRILOCAINE 2.5-2.5 % EX CREA
1.0000 "application " | TOPICAL_CREAM | CUTANEOUS | Status: DC | PRN
  Filled 2014-05-16: qty 5

## 2014-05-16 MED ORDER — ALTEPLASE 2 MG IJ SOLR: 2.0000 mg | Freq: Once | INTRAMUSCULAR | Status: AC | PRN

## 2014-05-16 MED ORDER — SODIUM CHLORIDE 0.9 % IV SOLN: 100.0000 mL | INTRAVENOUS | Status: DC | PRN

## 2014-05-16 MED ORDER — PENTAFLUOROPROP-TETRAFLUOROETH EX AERO
1.0000 "application " | INHALATION_SPRAY | CUTANEOUS | Status: DC | PRN
  Filled 2014-05-16: qty 30

## 2014-05-16 MED ORDER — MORPHINE SULFATE 2 MG/ML IJ SOLN
1.0000 mg | Freq: Four times a day (QID) | INTRAMUSCULAR | Status: DC | PRN
  Administered 2014-05-16 – 2014-05-17 (×2): 1 mg via INTRAVENOUS
  Filled 2014-05-16 (×2): qty 1

## 2014-05-16 MED ORDER — HEPARIN SODIUM (PORCINE) 1000 UNIT/ML DIALYSIS
1000.0000 [IU] | INTRAMUSCULAR | Status: DC | PRN
  Filled 2014-05-16: qty 1

## 2014-05-16 NOTE — Progress Notes (Signed)
Brief Nutrition Note  Consult received for enteral/tube feeding initiation and management.  Continuous Vital HP initated @ 20 ml/hr advance 10 ml q 6 hr to goal rate of 60 ml/hr.  Adult Enteral Nutrition Protocol initiated. Admitting Dx: Altered mental state [R41.82] SOB (shortness of breath) [R06.02]  Body mass index is 27.24 kg/(m^2). Pt meets criteria for overweight based on current BMI.  Labs:   Recent Labs Lab 05/14/14 2055 05/15/14 0151 05/16/14 0549  NA 135* 136* 134*  K 4.0 3.8 4.2  CL 98 97 90*  CO2 24 26 24   BUN 25* 29* 39*  CREATININE 1.73* 1.89* 2.66*  CALCIUM 8.1* 8.2* 7.9*  PHOS  --   --  4.4  GLUCOSE 288* 241* 235*    Royann Shivers MS,RD,CSG,LDN Office: [phone] Pager: [phone]

## 2014-05-16 NOTE — Progress Notes (Addendum)
PROGRESS NOTE  [name] ZOX:[id number] DOB: [birth year] DOA: 05/14/2014 PCP: Brooke PlumbStone, Nancy, NP  Summary: 73yow hospitalized 01/25/2014-03/06/2014 with STEMI >> emergent CABG and MVR, post-op course prolonged vent, balloon pump, PEG placement, CVVHD >> HD, eventually transferred to LTAC then to [facility] last few weeks, developed diarrhea on pneumonia treated with Levaquin. Per staff has been declining, not eating, confused. Transferred to ED 10/29 for lethargy and hypoxia; diagnosed with HCAP, treated with BiPAP and admitted. Current plan continue abx for sepsis, HCAP, respiratory failure; HD per nephrology.   Assessment/Plan: 1. Acute hypoxic, hypercapnic respiratory failure secondary to HCAP, appears better today, more awake.  2. Septic shock secondary to HCAP. May be stabilizing.  3. HCAP. 4. Acute encephalopathy. Noted to be confused August and September. Cardiology queried dementia. May be close to baseline now. 5. Bilateral pleural effusions, not felt to large enough for therapeutic tap 6. Chronic systolic CHF LVEF 40-45% 02/2014 7. History of STEMI, CABG x4, MVR 01/2014, cardiogenic shock, prolonged vent, [facility], [facility] then to [facility]. 8. Multiple wounds bilateral LE, s/p PEG; wound care recs noted, will consult surgery for further recs 9. Diarrhea cdiff negative likely secondary to TF, abx. 10. DM type 2 remains stable. 11. Atrial fibrillation on warfarin 12. ESRD on hemodialysis   A bit better today but remains critically ill. Continue abx for HCAP, sepsis. HD today per nephrology.  Continue wound care, flexi-seal, foley, will request surgery consult for further recs  Will resume tube feeds  Resume warfarin 11/1 if remains improved  Grandson/de facto POA plans to come in today  Code Status: DNR/DNI DVT prophylaxis: on warfarin, INR therapeutic Family Communication:  Disposition Plan: pending  Addendum 1730 Updated grandson [name] by  telephone  Brooke Sacksaniel [name], MD  [facility]  Pager [phone] If 7PM-7AM, please contact night-coverage at [email or web address], password [email or web address] 05/16/2014, 8:19 AM  LOS: 2 days   Consultants:  Nephrology   Procedures:  Dialysis catheter in right subclavian present on admission  Antibiotics:  Vancomycin 10/30 >>  Zosyn 10/30 >>  Levaquin 10/30   HPI/Subjective: Wound care recs noted, consider surgical debridement. No issues overnight, taken off BiPAP and currently on [place].  Speech difficult to understand, very awake, history not intelligible.   Objective: Filed Vitals:   05/16/14 0500 05/16/14 0601 05/16/14 0700 05/16/14 0717  BP: 93/53 95/51 94/56    Pulse: 82 79 88   Temp:   98.2 F (36.8 C)   TempSrc:   Axillary   Resp: 19 35 30   Height:      Weight:      SpO2: 96% 96% 97% 97%    Intake/Output Summary (Last 24 hours) at 05/16/14 0819 Last data filed at 05/16/14 0600  Gross per 24 hour  Intake 547.83 ml  Output    350 ml  Net 197.83 ml     Filed Weights   05/15/14 0100  Weight: 89.1 kg (196 lb 6.9 oz)    Exam:     Afebrile, SBP stable in 90s, HR and SpO2 stable  Appears calm, awake, alert, tries to speak, low-volume and difficult to understand. Does not follow commands. Ill but not toxic.  Eyes appear unremarkable  CV RRR no m/r/g. No LE edema. Telemetry SR.  Resp CTA bilaterally no w/r/r. Normal respiratory effort.  Abdomen soft ntnd  Skin no change, no evidence of ischemia  Musculoskeletal/neuro--does not follow commands  Data Reviewed:  UOP 250  Stool 100  CBG stable  BMP noted.  Leukocytosis has resolved  Hgb stable 9.6   Scheduled Meds: . antiseptic oral rinse  7 mL Mouth Rinse BID  . Chlorhexidine Gluconate Cloth  6 each Topical Q0600  . collagenase   Topical Daily  . furosemide  120 mg Intravenous BID  . insulin aspart  0-9 Units Subcutaneous 6 times per day  . levalbuterol  0.63 mg Nebulization Q6H  . mupirocin  ointment  1 application Nasal BID  . piperacillin-tazobactam (ZOSYN)  IV  2.25 g Intravenous 3 times per day  . sodium chloride  3 mL Intravenous Q12H  . 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

## 2014-05-16 NOTE — Progress Notes (Addendum)
Subjective: Interval History: none.  Objective: Vital signs in last 24 hours: Temp:  [97.8 F (36.6 C)-98.5 F (36.9 C)] 98.2 F (36.8 C) (10/31 0700) Pulse Rate:  [26-88] 88 (10/31 0700) Resp:  [7-35] 30 (10/31 0700) BP: (82-114)/(43-67) 94/56 mmHg (10/31 0700) SpO2:  [70 %-100 %] 97 % (10/31 0717) FiO2 (%):  [2 %-100 %] 2 % (10/31 0033) Weight change:   Intake/Output from previous day: 10/30 0701 - 10/31 0700 In: 597.8 [I.V.:257.8; IV Piggyback:300] Out: 350 [Urine:250; Stool:100] Intake/Output this shift:    Generally she is awake but disoriented to time and place Chest : Bilateral wheezing anteriorley Heart RRR II/VI SEM No edema  Lab Results:  Recent Labs  05/15/14 0151 05/16/14 0549  WBC 12.3* 9.9  HGB 10.3* 9.6*  HCT 35.6* 33.6*  PLT 280 281   BMET:  Recent Labs  05/15/14 0151 05/16/14 0549  NA 136* 134*  K 3.8 4.2  CL 97 90*  CO2 26 24  GLUCOSE 241* 235*  BUN 29* 39*  CREATININE 1.89* 2.66*  CALCIUM 8.2* 7.9*   No results found for this basename: PTH,  in the last 72 hours Iron Studies: No results found for this basename: IRON, TIBC, TRANSFERRIN, FERRITIN,  in the last 72 hours  Studies/Results: Ct Head Wo Contrast  05/14/2014   CLINICAL DATA:  Altered mental status, respiratory distress  EXAM: CT HEAD WITHOUT CONTRAST  TECHNIQUE: Contiguous axial images were obtained from the base of the skull through the vertex without intravenous contrast.  COMPARISON:  None.  FINDINGS: There is no evidence of mass effect, midline shift, or extra-axial fluid collections. There is no evidence of a space-occupying lesion or intracranial hemorrhage. There is no evidence of a cortical-based area of acute infarction. There is generalized cerebral atrophy. There is periventricular white matter low attenuation likely secondary to microangiopathy.  The ventricles and sulci are appropriate for the patient's age. The basal cisterns are patent.  Visualized portions of the  orbits are unremarkable. Mild bilateral sphenoid sinus and right ethmoid sinus mucosal thickening. Small right mastoid effusion. Cerebrovascular atherosclerotic calcifications are noted.  The osseous structures are unremarkable.  IMPRESSION: 1. No acute intracranial pathology.   Electronically Signed   By: Elige KoHetal  Patel   On: 05/14/2014 22:55   Ct Chest Wo Contrast  05/15/2014   CLINICAL DATA:  Sepsis.  EXAM: CT CHEST WITHOUT CONTRAST  TECHNIQUE: Multidetector CT imaging of the chest was performed following the standard protocol without IV contrast.  COMPARISON:  CT scan of February 09, 2014.  FINDINGS: No pneumothorax is noted. Moderate bilateral pleural effusions are noted with adjacent atelectasis of the lower lobes. There may be some degree of loculation involving the superior portion of the left pleural effusion. Mild ascites is noted in the visualized portion of the upper abdomen. No significant osseous abnormality is noted. Status post coronary artery bypass graft. Atherosclerotic calcifications of thoracic aorta are noted. Right internal jugular dialysis catheter is noted with distal tip in expected position of the cavoatrial junction. No significant mediastinal mass or adenopathy is noted.  IMPRESSION: Moderate bilateral pleural effusions are noted with adjacent atelectasis of the lower lobes. The superior aspect of the left pleural appears to be loculated.   Electronically Signed   By: Roque LiasJames  Green M.D.   On: 05/15/2014 12:58   Dg Chest Portable 1 View  05/14/2014   CLINICAL DATA:  Difficulty breathing and altered mental status ; acute renal failure  EXAM: PORTABLE CHEST - 1 VIEW  COMPARISON:  March 29, 2014  FINDINGS: There is consolidation in the left lower lobe with minimal left effusion. Lungs elsewhere clear. Heart is mildly enlarged with pulmonary vascularity within normal limits. No adenopathy. Central catheter tip is at the cavoatrial junction. No pneumothorax. Patient is status post mitral  valve replacement.  IMPRESSION: Left lower lobe consolidation with small left effusion. Right lung clear. Heart prominent. Patient is status post mitral valve replacement.   Electronically Signed   By: Bretta Bang M.D.   On: 05/14/2014 20:58    I have reviewed the patient's current medications.  Assessment/Plan: AKI: Patient s/p dialysis on Tuesday as out patient. Presently oliguric Problem#2 AMS; Etiology and her base line not clear. Patient awake today but still confused Problem#3 HCAP: Patient on antibiotics. She is afebrile but her white blood cell count still high Problem#4 Anemia: Her hemoglobin is with in our target goal  Problem#5 Hypotension : Seems long standing . Patient was on Midodrine 5 mg po tid as out patient Problem#6 Metabolic bone disease : Her calclium is in range Problem#7CAD s/p CABG We will dialyze patient today and remove 21/2 liters if her blood pressure tolerates Basic metabolic panel/phosphorus /cbc in am   LOS: 2 days   Asenath Balash S 05/16/2014,8:44 AM

## 2014-05-17 DIAGNOSIS — J9600 Acute respiratory failure, unspecified whether with hypoxia or hypercapnia: Secondary | ICD-10-CM

## 2014-05-17 DIAGNOSIS — R6521 Severe sepsis with septic shock: Secondary | ICD-10-CM

## 2014-05-17 LAB — BASIC METABOLIC PANEL
ANION GAP: 12 (ref 5–15)
BUN: 26 mg/dL — ABNORMAL HIGH (ref 6–23)
CO2: 28 meq/L (ref 19–32)
Calcium: 8.2 mg/dL — ABNORMAL LOW (ref 8.4–10.5)
Chloride: 103 mEq/L (ref 96–112)
Creatinine, Ser: 2.44 mg/dL — ABNORMAL HIGH (ref 0.50–1.10)
GFR calc Af Amer: 21 mL/min — ABNORMAL LOW (ref >90)
GFR calc non Af Amer: 18 mL/min — ABNORMAL LOW (ref >90)
Glucose, Bld: 135 mg/dL — ABNORMAL HIGH (ref 70–99)
Potassium: 3.9 mEq/L (ref 3.7–5.3)
Sodium: 143 mEq/L (ref 137–147)

## 2014-05-17 LAB — GLUCOSE, CAPILLARY
GLUCOSE-CAPILLARY: 160 mg/dL — AB (ref 70–99)
GLUCOSE-CAPILLARY: 193 mg/dL — AB (ref 70–99)
Glucose-Capillary: 165 mg/dL — ABNORMAL HIGH (ref 70–99)
Glucose-Capillary: 149 mg/dL — ABNORMAL HIGH (ref 70–99)
Glucose-Capillary: 180 mg/dL — ABNORMAL HIGH (ref 70–99)
Glucose-Capillary: 147 mg/dL — ABNORMAL HIGH (ref 70–99)

## 2014-05-17 LAB — CBC
HEMATOCRIT: 33.7 % — AB (ref 36.0–46.0)
Hemoglobin: 9.4 g/dL — ABNORMAL LOW (ref 12.0–15.0)
MCH: 27.6 pg (ref 26.0–34.0)
MCHC: 27.9 g/dL — ABNORMAL LOW (ref 30.0–36.0)
MCV: 98.8 fL (ref 78.0–100.0)
PLATELETS: 279 10*3/uL (ref 150–400)
RBC: 3.41 MIL/uL — ABNORMAL LOW (ref 3.87–5.11)
RDW: 28 % — AB (ref 11.5–15.5)
WBC: 9.5 10*3/uL (ref 4.0–10.5)

## 2014-05-17 LAB — PHOSPHORUS: Phosphorus: 3.7 mg/dL (ref 2.3–4.6)

## 2014-05-17 LAB — PROTIME-INR
INR: 2.7 — AB (ref 0.00–1.49)
PROTHROMBIN TIME: 28.9 s — AB (ref 11.6–15.2)

## 2014-05-17 MED ORDER — VANCOMYCIN HCL IN DEXTROSE 1-5 GM/200ML-% IV SOLN
1000.0000 mg | INTRAVENOUS | Status: DC
  Administered 2014-05-18 – 2014-05-22 (×3): 1000 mg via INTRAVENOUS
  Filled 2014-05-17 (×4): qty 200

## 2014-05-17 MED ORDER — EPOETIN ALFA 10000 UNIT/ML IJ SOLN
10000.0000 [IU] | INTRAMUSCULAR | Status: DC
  Administered 2014-05-18 – 2014-05-20 (×2): 10000 [IU] via INTRAVENOUS
  Filled 2014-05-17 (×2): qty 1

## 2014-05-17 NOTE — Progress Notes (Signed)
Nutrition Follow-up   INTERVENTION:   Continuous Vital High Protein currently @ 50 ml/hr via PEG advancing to goal rate of 60 ml/hr.   Tube feeding regimen provides 1440 kcal,  125 gr of protein, and 1203 ml of H2O.    NUTRITION DIAGNOSIS: Increased protein-energy needs related to wound healing as evidenced by current nutrition  guidelines.   Goal: Meet nutrition requirements based on pt care decisions  Monitor: nutrition support, skin assessments, labs and weight changes  73 y.o. female  Admitting Dx: Septic shock  ASSESSMENT: Pt is from [facility]. HX of MI, HTN, TIA, Thyriod dz, PAD, CABG, CHF, DM type 2 (04/10/14-A1C% 6.4). PEG (20 Fr.) placed during hospitalization in August.  Pt has HCAP with septic shock, acute encephalopathy, diarrhea, multiple wounds and ESRD with HD which was initiated in August. Family does not want "heroic" measures medically if pt does is not getting better.   Pt tube feeding started yesterday. She is tolerating well and advancing to goal rate of 60 ml/hr.  Surgical consult completed related to multiple decubitus ulcers. She is s/p dialysis yesterday.  Height: Ht Readings from Last 1 Encounters:  05/15/14 5\' 10"  (1.778 m)    Weight: Wt Readings from Last 1 Encounters:  05/17/14 205 lb 14.6 oz (93.4 kg)    Ideal Body Weight: 150# (68 kg)   Wt Readings from Last 10 Encounters:  05/17/14 205 lb 14.6 oz (93.4 kg)  03/06/14 189 lb 9.5 oz (86 kg)    Usual Body Weight: 190#   BMI:  Body mass index is 29.54 kg/(m^2). overweight  Estimated Nutritional Needs: Kcal: 1500 Protein: 115-133 gr Fluid: > 1500 ml daily  Skin: multiple wounds: right heel deep tissue injury, left heel deep tissue injury,  unstagable to buttocks and right ischium, right anterior foot deep tissue injury  Diet Order:    EDUCATION NEEDS: -No education needs identified at this time   Intake/Output Summary (Last 24 hours) at 05/17/14 1607 Last data filed at 05/17/14  1450  Gross per 24 hour  Intake   1021 ml  Output    550 ml  Net    471 ml    Last BM: 11/1 rectal tube  Labs:   Recent Labs Lab 05/15/14 0151 05/16/14 0549 05/17/14 0652  NA 136* 134* 143  K 3.8 4.2 3.9  CL 97 90* 103  CO2 26 24 28   BUN 29* 39* 26*  CREATININE 1.89* 2.66* 2.44*  CALCIUM 8.2* 7.9* 8.2*  PHOS  --  4.4 3.7  GLUCOSE 241* 235* 135*    CBG (last 3)   Recent Labs  05/17/14 0408 05/17/14 0825 05/17/14 1141  GLUCAP 165* 149* 180*    Scheduled Meds: . antiseptic oral rinse  7 mL Mouth Rinse BID  . Chlorhexidine Gluconate Cloth  6 each Topical Q0600  . collagenase   Topical Daily  . [START ON 05/18/2014] epoetin alfa  10,000 Units Intravenous Once per day on Mon Wed Fri  . feeding supplement (VITAL HIGH PROTEIN)  1,000 mL Per Tube Q24H  . furosemide  120 mg Intravenous BID  . insulin aspart  0-9 Units Subcutaneous 6 times per day  . levalbuterol  0.63 mg Nebulization Q6H  . mupirocin ointment  1 application Nasal BID  . piperacillin-tazobactam (ZOSYN)  IV  2.25 g Intravenous 3 times per day  . sodium chloride  3 mL Intravenous Q12H  . [START ON 05/18/2014] vancomycin  1,000 mg Intravenous Q M,W,F-HD    Continuous Infusions:  Past Medical History  Diagnosis Date  . History of MI (myocardial infarction)     [facility] (cannot get cath report on Care Everywhere(  . Essential hypertension   . TIA (transient ischemic attack)   . Thyroid disease   . Anxiety   . Diabetes mellitus type 2 with peripheral artery disease     On insulin  . PAD (peripheral artery disease)   . Hyperlipidemia associated with type 2 diabetes mellitus   . A-fib   . ARF (acute renal failure)     Past Surgical History  Procedure Laterality Date  . Cardiac catheterization      PCI - unknown vessel or stent; unknown date; [facility].  . Abdominal hysterectomy    . Cholecystectomy    . Coronary artery bypass graft N/A 01/25/2014    Procedure: CORONARY ARTERY  BYPASS GRAFTING TIMES FOUR USING LEFT INTERNAL MAMMARY ARTERY TO LAD, SAPHENOUS VEIN GRAFTS TO OM1, OM2, AND PDA;  Surgeon: Kerin PernaPeter Van Trigt, MD;  [facility];  Service: Open Heart Surgery;  Laterality: N/A;  . Mitral valve replacement N/A 01/25/2014    Procedure: MITRAL VALVE (MV) REPLACEMENT;  Surgeon: Kerin PernaPeter Van Trigt, MD;  [facility];  Service: Open Heart Surgery;  Laterality: N/A;  . Av fistula placement Right 04/09/2014    Procedure: ARTERIOVENOUS (AV) FISTULA CREATION;  Surgeon: Chuck Hinthristopher S Dickson, MD;  [facility];  Service: Vascular;  Laterality: Right;    Royann ShiversLynn Shiloh Swopes MS,RD,CSG,LDN Office: [phone] Pager: [phone]

## 2014-05-17 NOTE — Progress Notes (Signed)
Subjective: Interval History: none.  Objective: Vital signs in last 24 hours: Temp:  [98.2 F (36.8 C)-99.1 F (37.3 C)] 98.9 F (37.2 C) (11/01 0400) Pulse Rate:  [74-96] 77 (11/01 0600) Resp:  [21-34] 23 (11/01 0600) BP: (87-111)/(42-74) 101/54 mmHg (11/01 0600) SpO2:  [87 %-98 %] 94 % (11/01 0600) Weight:  [86.1 kg (189 lb 13.1 oz)-93.4 kg (205 lb 14.6 oz)] 93.4 kg (205 lb 14.6 oz) (11/01 0500) Weight change:   Intake/Output from previous day: 10/31 0701 - 11/01 0700 In: 1021 [I.V.:240; NG/GT:227; IV Piggyback:474] Out: 2775 [Urine:175; Stool:350] Intake/Output this shift:    Generally:Patient is very lethargic and barely open her eyes when called Chest : Bilateral ronchi and expiratory wheezing Heart RRR II/VI SEM No edema  Lab Results:  Recent Labs  05/16/14 0549 05/17/14 0652  WBC 9.9 9.5  HGB 9.6* 9.4*  HCT 33.6* 33.7*  PLT 281 279   BMET:   Recent Labs  05/16/14 0549 05/17/14 0652  NA 134* 143  K 4.2 3.9  CL 90* 103  CO2 24 28  GLUCOSE 235* 135*  BUN 39* 26*  CREATININE 2.66* 2.44*  CALCIUM 7.9* 8.2*   No results for input(s): PTH in the last 72 hours. Iron Studies: No results for input(s): IRON, TIBC, TRANSFERRIN, FERRITIN in the last 72 hours.  Studies/Results: Ct Chest Wo Contrast  05/15/2014   CLINICAL DATA:  Sepsis.  EXAM: CT CHEST WITHOUT CONTRAST  TECHNIQUE: Multidetector CT imaging of the chest was performed following the standard protocol without IV contrast.  COMPARISON:  CT scan of February 09, 2014.  FINDINGS: No pneumothorax is noted. Moderate bilateral pleural effusions are noted with adjacent atelectasis of the lower lobes. There may be some degree of loculation involving the superior portion of the left pleural effusion. Mild ascites is noted in the visualized portion of the upper abdomen. No significant osseous abnormality is noted. Status post coronary artery bypass graft. Atherosclerotic calcifications of thoracic aorta are noted.  Right internal jugular dialysis catheter is noted with distal tip in expected position of the cavoatrial junction. No significant mediastinal mass or adenopathy is noted.  IMPRESSION: Moderate bilateral pleural effusions are noted with adjacent atelectasis of the lower lobes. The superior aspect of the left pleural appears to be loculated.   Electronically Signed   By: Roque Lias M.D.   On: 05/15/2014 12:58    I have reviewed the patient's current medications.  Assessment/Plan: AKI: Patient s/p dialysis on yesterday> her potassium is normal Problem#2 AMS; Etiology and her base line not clear. Patient is more sleepy today but arousable Problem#3 HCAP: Patient on antibiotics. She is afebrile and her white blood cell count has normalized. Problem#4 Anemia: Her hemoglobin is below our target goal but stable  Problem#5 Hypotension : Seems long standing . Patient was on Midodrine 5 mg po tid as out patient Problem#6 Metabolic bone disease : Her calclium is in range Problem#7CAD s/p CABG Problem#8 Bilateral pleural effusion We will dialyze patient tomprrow Basic metabolic panel/phosphorus /cbc in am   LOS: 3 days   Tarig Zimmers S 05/17/2014,7:40 AM

## 2014-05-17 NOTE — Progress Notes (Signed)
PROGRESS NOTE  [name] ZOX:[id number] DOB: [birth year] DOA: 05/14/2014 PCP: Mickle [name], Nancy, NP  Summary: 73yow hospitalized 01/25/2014-03/06/2014 with STEMI >> emergent CABG and MVR, post-op course prolonged vent, balloon pump, PEG placement, CVVHD >> HD, eventually transferred to LTAC then to [facility] last few weeks, developed diarrhea on pneumonia treated with Levaquin. Per staff has been declining, not eating, confused. Transferred to ED 10/29 for lethargy and hypoxia; diagnosed with HCAP, treated with BiPAP and admitted. Current plan continue abx for sepsis, HCAP, respiratory failure; HD per nephrology.   Assessment/Plan: 1. Acute hypoxic, hypercapnic respiratory failure secondary to HCAP. Remains critically ill but oxygenation stable. 2. Septic shock secondary to HCAP. Intermittent hypertension persists, possibly chronic. Tolerated hemodialysis yesterday. 3. HCAP. 4. Acute encephalopathy. Noted to be confused August and September. Cardiology queried dementia.  5. Bilateral pleural effusions, not felt to large enough for therapeutic tap 6. Chronic systolic CHF LVEF 40-45% 02/2014. Appears euvolemic. 7. History of STEMI, CABG x4, MVR 01/2014, cardiogenic shock, prolonged vent, [facility]. 8. Multiple wounds bilateral LE, s/p PEG; wound care recs noted. Discussed care with Dr. Lovell SheehanJenkins, he recommends chemical debridement at this point. Any surgical debridement would require reversal of anticoagulation and poor healing of wound would be expected. Surgical intervention not recommended at this point. 9. Diarrhea cdiff negative likely secondary to TF, abx. 10. DM type 2 stable. 11. Atrial fibrillation on warfarin. 12. ESRD on hemodialysis. Tolerated hemodialysis 10/31.   Remain in stepdown. Remains critically ill with hypoxic respiratory failure, healthcare associated pneumonia, septic shock, acute encephalopathy.  Continue empiric antibiotics,  tube feeds, wound care.  Chest x-ray in the morning  Continue tube feeds.  Grandson planning to bring family in today for family meeting.  Code Status: DNR/DNI DVT prophylaxis: on warfarin, INR therapeutic Family Communication:  Disposition Plan: pending  Brooke Sacksaniel Haywood Meinders, MD  [facility]  Pager [phone] If 7PM-7AM, please contact night-coverage at [email or web address], password [email or web address] 05/17/2014, 7:37 AM  LOS: 3 days   Consultants:  Nephrology   Procedures:  Dialysis catheter in right subclavian present on admission  Antibiotics:  Vancomycin 10/30 >>  Zosyn 10/30 >>  Levaquin 10/30   HPI/Subjective: S/p HD yesterday without event. No new issues overnight per nursing. Received morphine overnight. Somnolent this morning.surgery has evaluated the patient.  No history available.  Objective: Filed Vitals:   05/17/14 0357 05/17/14 0400 05/17/14 0500 05/17/14 0600  BP:  105/52 110/53 101/54  Pulse:  80 81 77  Temp:  98.9 F (37.2 C)    TempSrc:  Axillary    Resp:  31 24 23   Height:      Weight:   93.4 kg (205 lb 14.6 oz)   SpO2: 96% 96% 96% 94%    Intake/Output Summary (Last 24 hours) at 05/17/14 0737 Last data filed at 05/17/14 0630  Gross per 24 hour  Intake   1021 ml  Output   2775 ml  Net  -1754 ml     Filed Weights   05/16/14 1014 05/16/14 1429 05/17/14 0500  Weight: 88.1 kg (194 lb 3.6 oz) 86.1 kg (189 lb 13.1 oz) 93.4 kg (205 lb 14.6 oz)    Exam:     Afebrile, vital signs stable. Normotensive.  Somnolent. Appears calm, comfortable.  Head appears unremarkable. Pupils appear normal.  Lips, face appear unremarkable.  Cardiovascular regular rate and rhythm. No murmur, rub or gallop. No significant lower extremity edema.telemetry sinus rhythm.  Respiratory clear to auscultation bilaterally. No wheezes, rales or rhonchi. Respiratory effort.  Abdomen soft, nontender, nondistended.  Skin no change in chronic wounds. Feet remain warm and dry  with grossly normal perfusion.  Data Reviewed:  Stool 350.  CBG stable  Basic metabolic panel unremarkable.  Hemoglobin stable 9.4.  INR 2.70  Scheduled Meds: . antiseptic oral rinse  7 mL Mouth Rinse BID  . Chlorhexidine Gluconate Cloth  6 each Topical Q0600  . collagenase   Topical Daily  . feeding supplement (VITAL HIGH PROTEIN)  1,000 mL Per Tube Q24H  . furosemide  120 mg Intravenous BID  . insulin aspart  0-9 Units Subcutaneous 6 times per day  . levalbuterol  0.63 mg Nebulization Q6H  . mupirocin ointment  1 application Nasal BID  . piperacillin-tazobactam (ZOSYN)  IV  2.25 g Intravenous 3 times per day  . sodium chloride  3 mL Intravenous Q12H  . 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

## 2014-05-17 NOTE — Consult Note (Signed)
Reason for Consult:multiple decubitus ulcers Referring Physician: hospitalists  [name] is an 73 y.o. female.  HPI: patient is a 73 year old white female transferred from nursing home for workup of hypotension. She was found to have multiple decubitus ulcers. She has been seen by the wound care specialist. Please see their note. I have been asked to see her concerning possible excisional debridement.  Past Medical History  Diagnosis Date  . History of MI (myocardial infarction)     PCI done @ [facility] (cannot get cath report on Care Everywhere(  . Essential hypertension   . TIA (transient ischemic attack)   . Thyroid disease   . Anxiety   . Diabetes mellitus type 2 with peripheral artery disease     On insulin  . PAD (peripheral artery disease)   . Hyperlipidemia associated with type 2 diabetes mellitus   . A-fib   . ARF (acute renal failure)     Past Surgical History  Procedure Laterality Date  . Cardiac catheterization      PCI - unknown vessel or stent; unknown date; @ [facility]  . Abdominal hysterectomy    . Cholecystectomy    . Coronary artery bypass graft N/A 01/25/2014    Procedure: CORONARY ARTERY BYPASS GRAFTING TIMES FOUR USING LEFT INTERNAL MAMMARY ARTERY TO LAD, SAPHENOUS VEIN GRAFTS TO OM1, OM2, AND PDA;  Surgeon: Ivin Poot, MD;  Location: [place];  Service: Open Heart Surgery;  Laterality: N/A;  . Mitral valve replacement N/A 01/25/2014    Procedure: MITRAL VALVE (MV) REPLACEMENT;  Surgeon: Ivin Poot, MD;  Location: [place];  Service: Open Heart Surgery;  Laterality: N/A;  . Av fistula placement Right 04/09/2014    Procedure: ARTERIOVENOUS (AV) FISTULA CREATION;  Surgeon: Angelia Mould, MD;  Location: [facility] OR;  Service: Vascular;  Laterality: Right;    Family History  Problem Relation Age of Onset  . Heart attack Mother   . Heart disease Mother   . Heart attack Father   . Heart disease Father     Social History:  reports that she has  never smoked. She has never used smokeless tobacco. She reports that she does not drink alcohol or use illicit drugs.  Allergies: No Known Allergies  Medications: I have reviewed the patient's current medications.  Results for orders placed or performed during the hospital encounter of 05/14/14 (from the past 48 hour(s))  Glucose, capillary     Status: Abnormal   Collection Time: 05/15/14 12:02 PM  Result Value Ref Range   Glucose-Capillary 132 (H) 70 - 99 mg/dL   Comment 1 Documented in Chart    Comment 2 Notify RN   Blood gas, arterial     Status: Abnormal   Collection Time: 05/15/14 12:55 PM  Result Value Ref Range   FIO2 100.00 %   Delivery systems OXYGEN MASK    pH, Arterial 7.302 (L) 7.350 - 7.450   pCO2 arterial 55.0 (H) 35.0 - 45.0 mmHg   pO2, Arterial 174.0 (H) 80.0 - 100.0 mmHg   Bicarbonate 26.3 (H) 20.0 - 24.0 mEq/L   TCO2 24.8 0 - 100 mmol/L   Acid-Base Excess 0.7 0.0 - 2.0 mmol/L   O2 Saturation 99.3 %   Patient temperature 37.0    Collection site LEFT RADIAL    Drawn by [id number]    Sample type ARTERIAL    Allens test (pass/fail) PASS PASS  Troponin I (q 6hr x 3)     Status: None  Collection Time: 05/15/14  1:38 PM  Result Value Ref Range   Troponin I <0.30 <0.30 ng/mL    Comment:        Due to the release kinetics of cTnI, a negative result within the first hours of the onset of symptoms does not rule out myocardial infarction with certainty. If myocardial infarction is still suspected, repeat the test at appropriate intervals.  Clostridium Difficile by PCR     Status: None   Collection Time: 05/15/14  3:19 PM  Result Value Ref Range   C difficile by pcr NEGATIVE NEGATIVE  Glucose, capillary     Status: Abnormal   Collection Time: 05/15/14  5:03 PM  Result Value Ref Range   Glucose-Capillary 124 (H) 70 - 99 mg/dL   Comment 1 Documented in Chart    Comment 2 Notify RN   Glucose, capillary     Status: Abnormal   Collection Time: 05/15/14  8:49 PM   Result Value Ref Range   Glucose-Capillary 107 (H) 70 - 99 mg/dL   Comment 1 Notify RN    Comment 2 Documented in Chart   Glucose, capillary     Status: None   Collection Time: 05/15/14 11:11 PM  Result Value Ref Range   Glucose-Capillary 93 70 - 99 mg/dL   Comment 1 Notify RN    Comment 2 Documented in Chart   Glucose, capillary     Status: None   Collection Time: 05/16/14  4:06 AM  Result Value Ref Range   Glucose-Capillary 98 70 - 99 mg/dL  Phosphorus     Status: None   Collection Time: 05/16/14  5:49 AM  Result Value Ref Range   Phosphorus 4.4 2.3 - 4.6 mg/dL  Basic metabolic panel     Status: Abnormal   Collection Time: 05/16/14  5:49 AM  Result Value Ref Range   Sodium 134 (L) 137 - 147 mEq/L   Potassium 4.2 3.7 - 5.3 mEq/L   Chloride 90 (L) 96 - 112 mEq/L   CO2 24 19 - 32 mEq/L   Glucose, Bld 235 (H) 70 - 99 mg/dL   BUN 39 (H) 6 - 23 mg/dL   Creatinine, Ser 2.66 (H) 0.50 - 1.10 mg/dL   Calcium 7.9 (L) 8.4 - 10.5 mg/dL   GFR calc non Af Amer 16 (L) >90 mL/min   GFR calc Af Amer 19 (L) >90 mL/min    Comment: (NOTE) The eGFR has been calculated using the CKD EPI equation. This calculation has not been validated in all clinical situations. eGFR's persistently <90 mL/min signify possible Chronic Kidney Disease.   Anion gap 20 (H) 5 - 15  CBC     Status: Abnormal   Collection Time: 05/16/14  5:49 AM  Result Value Ref Range   WBC 9.9 4.0 - 10.5 K/uL   RBC 3.48 (L) 3.87 - 5.11 MIL/uL   Hemoglobin 9.6 (L) 12.0 - 15.0 g/dL   HCT 33.6 (L) 36.0 - 46.0 %   MCV 96.6 78.0 - 100.0 fL   MCH 27.6 26.0 - 34.0 pg   MCHC 28.6 (L) 30.0 - 36.0 g/dL   RDW 27.1 (H) 11.5 - 15.5 %   Platelets 281 150 - 400 K/uL  Glucose, capillary     Status: Abnormal   Collection Time: 05/16/14  7:32 AM  Result Value Ref Range   Glucose-Capillary 112 (H) 70 - 99 mg/dL   Comment 1 Notify RN   Glucose, capillary     Status: Abnormal  Collection Time: 05/16/14  4:45 PM  Result Value Ref Range    Glucose-Capillary 130 (H) 70 - 99 mg/dL  Glucose, capillary     Status: Abnormal   Collection Time: 05/16/14  7:26 PM  Result Value Ref Range   Glucose-Capillary 121 (H) 70 - 99 mg/dL   Comment 1 Notify RN   Glucose, capillary     Status: Abnormal   Collection Time: 05/16/14 11:43 PM  Result Value Ref Range   Glucose-Capillary 122 (H) 70 - 99 mg/dL   Comment 1 Notify RN   Glucose, capillary     Status: Abnormal   Collection Time: 05/17/14  4:08 AM  Result Value Ref Range   Glucose-Capillary 165 (H) 70 - 99 mg/dL  Basic metabolic panel     Status: Abnormal   Collection Time: 05/17/14  6:52 AM  Result Value Ref Range   Sodium 143 137 - 147 mEq/L    Comment: DELTA CHECK NOTED   Potassium 3.9 3.7 - 5.3 mEq/L   Chloride 103 96 - 112 mEq/L   CO2 28 19 - 32 mEq/L   Glucose, Bld 135 (H) 70 - 99 mg/dL   BUN 26 (H) 6 - 23 mg/dL   Creatinine, Ser 2.44 (H) 0.50 - 1.10 mg/dL   Calcium 8.2 (L) 8.4 - 10.5 mg/dL   GFR calc non Af Amer 18 (L) >90 mL/min   GFR calc Af Amer 21 (L) >90 mL/min    Comment: (NOTE) The eGFR has been calculated using the CKD EPI equation. This calculation has not been validated in all clinical situations. eGFR's persistently <90 mL/min signify possible Chronic Kidney Disease.    Anion gap 12 5 - 15  CBC     Status: Abnormal   Collection Time: 05/17/14  6:52 AM  Result Value Ref Range   WBC 9.5 4.0 - 10.5 K/uL   RBC 3.41 (L) 3.87 - 5.11 MIL/uL   Hemoglobin 9.4 (L) 12.0 - 15.0 g/dL   HCT 33.7 (L) 36.0 - 46.0 %   MCV 98.8 78.0 - 100.0 fL   MCH 27.6 26.0 - 34.0 pg   MCHC 27.9 (L) 30.0 - 36.0 g/dL   RDW 28.0 (H) 11.5 - 15.5 %   Platelets 279 150 - 400 K/uL  Protime-INR     Status: Abnormal   Collection Time: 05/17/14  6:52 AM  Result Value Ref Range   Prothrombin Time 28.9 (H) 11.6 - 15.2 seconds   INR 2.70 (H) 0.00 - 1.49  Phosphorus     Status: None   Collection Time: 05/17/14  6:52 AM  Result Value Ref Range   Phosphorus 3.7 2.3 - 4.6 mg/dL  Glucose,  capillary     Status: Abnormal   Collection Time: 05/17/14  8:25 AM  Result Value Ref Range   Glucose-Capillary 149 (H) 70 - 99 mg/dL   Comment 1 Notify RN     Ct Chest Wo Contrast  05/15/2014   CLINICAL DATA:  Sepsis.  EXAM: CT CHEST WITHOUT CONTRAST  TECHNIQUE: Multidetector CT imaging of the chest was performed following the standard protocol without IV contrast.  COMPARISON:  CT scan of February 09, 2014.  FINDINGS: No pneumothorax is noted. Moderate bilateral pleural effusions are noted with adjacent atelectasis of the lower lobes. There may be some degree of loculation involving the superior portion of the left pleural effusion. Mild ascites is noted in the visualized portion of the upper abdomen. No significant osseous abnormality is noted. Status post coronary artery  bypass graft. Atherosclerotic calcifications of thoracic aorta are noted. Right internal jugular dialysis catheter is noted with distal tip in expected position of the cavoatrial junction. No significant mediastinal mass or adenopathy is noted.  IMPRESSION: Moderate bilateral pleural effusions are noted with adjacent atelectasis of the lower lobes. The superior aspect of the left pleural appears to be loculated.   Electronically Signed   By: Sabino Dick M.D.   On: 05/15/2014 12:58    ROS: see chart  Blood pressure 87/54, pulse 74, temperature 98.9 F (37.2 C), temperature source Axillary, resp. rate 31, height 5' 10"  (1.778 m), weight 93.4 kg (205 lb 14.6 oz), SpO2 95 %. Physical Exam: bedridden confused white female in no acute distress. Skin: Bilateral dry heel eschars present. Full-thickness skin necrosis just inferior to the coccyx. Perianal skin maceration and erythema present. Assessment/Plan: Impression: Multiple decubitus ulcers, anticoagulated with INR at 2.7. This includes any active surgical debridement at this time. Plan: Continue chemical debridement of sacral ulceration. Her prognosis is poor for healing these.  Optimize nutritional status and appropriate care including air mattress, frequent turning. Will follow peripherally with you.  Kendal Ghazarian A 05/17/2014, 10:08 AM

## 2014-05-18 ENCOUNTER — Inpatient Hospital Stay (HOSPITAL_COMMUNITY): Payer: Medicare Other

## 2014-05-18 LAB — CBC
HEMATOCRIT: 35.5 % — AB (ref 36.0–46.0)
Hemoglobin: 10.1 g/dL — ABNORMAL LOW (ref 12.0–15.0)
MCH: 28.1 pg (ref 26.0–34.0)
MCHC: 28.5 g/dL — ABNORMAL LOW (ref 30.0–36.0)
MCV: 98.6 fL (ref 78.0–100.0)
Platelets: 272 10*3/uL (ref 150–400)
RBC: 3.6 MIL/uL — AB (ref 3.87–5.11)
RDW: 27.8 % — AB (ref 11.5–15.5)
WBC: 10.1 10*3/uL (ref 4.0–10.5)

## 2014-05-18 LAB — BASIC METABOLIC PANEL
Anion gap: 15 (ref 5–15)
BUN: 35 mg/dL — ABNORMAL HIGH (ref 6–23)
CALCIUM: 8.5 mg/dL (ref 8.4–10.5)
CO2: 26 mEq/L (ref 19–32)
Chloride: 103 mEq/L (ref 96–112)
Creatinine, Ser: 3.27 mg/dL — ABNORMAL HIGH (ref 0.50–1.10)
GFR calc Af Amer: 15 mL/min — ABNORMAL LOW (ref >90)
GFR calc non Af Amer: 13 mL/min — ABNORMAL LOW (ref >90)
GLUCOSE: 166 mg/dL — AB (ref 70–99)
Potassium: 4.1 mEq/L (ref 3.7–5.3)
SODIUM: 144 meq/L (ref 137–147)

## 2014-05-18 LAB — GLUCOSE, CAPILLARY
GLUCOSE-CAPILLARY: 133 mg/dL — AB (ref 70–99)
GLUCOSE-CAPILLARY: 250 mg/dL — AB (ref 70–99)
GLUCOSE-CAPILLARY: 218 mg/dL — AB (ref 70–99)
GLUCOSE-CAPILLARY: 118 mg/dL — AB (ref 70–99)
Glucose-Capillary: 222 mg/dL — ABNORMAL HIGH (ref 70–99)

## 2014-05-18 LAB — PROTIME-INR
INR: 2.21 — AB (ref 0.00–1.49)
Prothrombin Time: 24.7 seconds — ABNORMAL HIGH (ref 11.6–15.2)

## 2014-05-18 LAB — PHOSPHORUS: Phosphorus: 4.5 mg/dL (ref 2.3–4.6)

## 2014-05-18 LAB — HEPATITIS B SURFACE ANTIGEN: Hepatitis B Surface Ag: NEGATIVE

## 2014-05-18 MED ORDER — SODIUM CHLORIDE 0.9 % IV SOLN: 100.0000 mL | INTRAVENOUS | Status: DC | PRN

## 2014-05-18 MED ORDER — ALTEPLASE 2 MG IJ SOLR: 2.0000 mg | Freq: Once | INTRAMUSCULAR | Status: AC | PRN

## 2014-05-18 NOTE — Progress Notes (Signed)
Nutrition Follow-up   INTERVENTION:   Continuous Vital High Protein now at goal rate of 60 ml/hr via PEG.  Tube feeding regimen provides 1440 kcal,  125 gr of protein, and 1203 ml of H2O.    NUTRITION DIAGNOSIS: Increased protein-energy needs related to wound healing as evidenced by current nutrition  guidelines.   Goal: Meet nutrition requirements based on pt care decisions  Monitor: nutrition support, skin assessments, labs and weight changes  73 y.o. female  Admitting Dx: Septic shock  ASSESSMENT: Pt is from [place]. HX of MI, HTN, TIA, Thyriod dz, PAD, CABG, CHF, DM type 2 (04/10/14-A1C% 6.4). PEG (20 Fr.) placed during hospitalization in August.  Pt has HCAP with septic shock, acute encephalopathy, diarrhea, multiple wounds and ESRD with HD which was initiated in August.   Pt tolerating enteral feeding at goal rate with residual of 30 ml. Scheduled for dialysis today. Re-assessed estimated nutrition needs.   Height: Ht Readings from Last 1 Encounters:  05/15/14 5\' 10"  (1.778 m)    Weight: Wt Readings from Last 1 Encounters:  05/18/14 210 lb 3.2 oz (95.346 kg)    Ideal Body Weight: 150# (68 kg)   Wt Readings from Last 10 Encounters:  05/18/14 210 lb 3.2 oz (95.346 kg)  03/06/14 189 lb 9.5 oz (86 kg)    Usual Body Weight: 190#   BMI:  Body mass index is 30.16 kg/(m^2). obesity class I (likely skewed with variable fluid status)  Estimated Nutritional Needs: Kcal: 1500-1700 Protein: 115-133 gr Fluid: 1200 ml daily  Skin: multiple wounds: right heel deep tissue injury, left heel deep tissue injury,  unstagable to buttocks and right ischium, right anterior foot deep tissue injury  Diet Order:    EDUCATION NEEDS: -No education needs identified at this time   Intake/Output Summary (Last 24 hours) at 05/18/14 1548 Last data filed at 05/18/14 1429  Gross per 24 hour  Intake 2288.17 ml  Output    250 ml  Net 2038.17 ml    Last BM: 11/2 rectal tube (small  amount)  Labs:   Recent Labs Lab 05/16/14 0549 05/17/14 0652 05/18/14 0638  NA 134* 143 144  K 4.2 3.9 4.1  CL 90* 103 103  CO2 24 28 26   BUN 39* 26* 35*  CREATININE 2.66* 2.44* 3.27*  CALCIUM 7.9* 8.2* 8.5  PHOS 4.4 3.7 4.5  GLUCOSE 235* 135* 166*    CBG (last 3)   Recent Labs  05/18/14 0404 05/18/14 0817 05/18/14 1101  GLUCAP 133* 250* 218*    Scheduled Meds: . antiseptic oral rinse  7 mL Mouth Rinse BID  . Chlorhexidine Gluconate Cloth  6 each Topical Q0600  . collagenase   Topical Daily  . epoetin alfa  10,000 Units Intravenous Once per day on Mon Wed Fri  . feeding supplement (VITAL HIGH PROTEIN)  1,000 mL Per Tube Q24H  . furosemide  120 mg Intravenous BID  . insulin aspart  0-9 Units Subcutaneous 6 times per day  . levalbuterol  0.63 mg Nebulization Q6H  . mupirocin ointment  1 application Nasal BID  . piperacillin-tazobactam (ZOSYN)  IV  2.25 g Intravenous 3 times per day  . sodium chloride  3 mL Intravenous Q12H  . vancomycin  1,000 mg Intravenous Q M,W,F-HD    Continuous Infusions:   Past Medical History  Diagnosis Date  . History of MI (myocardial infarction)     PCI done @ [facility] (cannot get cath report on Care Everywhere(  . Essential  hypertension   . TIA (transient ischemic attack)   . Thyroid disease   . Anxiety   . Diabetes mellitus type 2 with peripheral artery disease     On insulin  . PAD (peripheral artery disease)   . Hyperlipidemia associated with type 2 diabetes mellitus   . A-fib   . ARF (acute renal failure)     Past Surgical History  Procedure Laterality Date  . Cardiac catheterization      PCI - unknown vessel or stent; unknown date; [facility]  . Abdominal hysterectomy    . Cholecystectomy    . Coronary artery bypass graft N/A 01/25/2014    Procedure: CORONARY ARTERY BYPASS GRAFTING TIMES FOUR USING LEFT INTERNAL MAMMARY ARTERY TO LAD, SAPHENOUS VEIN GRAFTS TO OM1, OM2, AND PDA;  Surgeon: Kerin PernaPeter Van Trigt, MD;   [facility];  Service: Open Heart Surgery;  Laterality: N/A;  . Mitral valve replacement N/A 01/25/2014    Procedure: MITRAL VALVE (MV) REPLACEMENT;  Surgeon: Kerin PernaPeter Van Trigt, MD;  [facility];  Service: Open Heart Surgery;  Laterality: N/A;  . Av fistula placement Right 04/09/2014    Procedure: ARTERIOVENOUS (AV) FISTULA CREATION;  Surgeon: Chuck Hinthristopher S Dickson, MD;  [facility];  Service: Vascular;  Laterality: Right;    Royann ShiversLynn Georgina Krist MS,RD,CSG,LDN Office: [phone] Pager: [phone]

## 2014-05-18 NOTE — Procedures (Signed)
   HEMODIALYSIS TREATMENT NOTE:  4 hour heparin-free dialysis completed via right tunneled catheter.  Goal met:  Tolerated removal of 2.5 liters with intradialytic SBP 90s-110.  All blood was reinfused.  Report given to [facility], RN.  Rockwell Alexandria, RN, CDN

## 2014-05-18 NOTE — Progress Notes (Signed)
If the patient is going to be in the hospital for more than another day, requesting a PICC line since the patient has poor venous access and is a hard stick for lab.  She does have limited access due to her AV fistula in the right arm.

## 2014-05-18 NOTE — Progress Notes (Signed)
Subjective: Interval History: Patient does not talk  Objective: Vital signs in last 24 hours: Temp:  [98.6 F (37 C)-100 F (37.8 C)] 100 F (37.8 C) (11/02 0400) Pulse Rate:  [74-87] 81 (11/02 1000) Resp:  [15-35] 27 (11/02 1000) BP: (65-112)/(41-76) 104/60 mmHg (11/02 1000) SpO2:  [93 %-99 %] 95 % (11/02 1000) FiO2 (%):  [2 %] 2 % (11/01 1455) Weight:  [93.3 kg (205 lb 11 oz)] 93.3 kg (205 lb 11 oz) (11/02 0600) Weight change: 5.2 kg (11 lb 7.4 oz)  Intake/Output from previous day: 11/01 0701 - 11/02 0700 In: 1706.2 [I.V.:240; NG/GT:1012.2; IV Piggyback:274] Out: 275 [Urine:125; Stool:150] Intake/Output this shift: Total I/O In: 302 [NG/GT:240; IV Piggyback:62] Out: -   Generally she is more alert today as compared to yestureday. But  Still she does not talk  And answer qeustions Chest : Clear to auscultation anteriorlly Heart RRR II/VI SEM Abdomen: None tender No edema  Lab Results:  Recent Labs  05/17/14 0652 05/18/14 0638  WBC 9.5 10.1  HGB 9.4* 10.1*  HCT 33.7* 35.5*  PLT 279 272   BMET:   Recent Labs  05/17/14 0652 05/18/14 0638  NA 143 144  K 3.9 4.1  CL 103 103  CO2 28 26  GLUCOSE 135* 166*  BUN 26* 35*  CREATININE 2.44* 3.27*  CALCIUM 8.2* 8.5   No results for input([name]): PTH in the last 72 hours. Iron Studies: No results for input([name]): IRON, TIBC, TRANSFERRIN, FERRITIN in the last 72 hours.  Studies/Results: Dg Chest Port 1 View  05/18/2014   CLINICAL DATA:  Difficulty breathing and hypotension; altered mental status  EXAM: PORTABLE CHEST - 1 VIEW  COMPARISON:  Chest radiograph May 14, 2014 chest CT May 15, 2014  FINDINGS: Left effusion is slightly larger. There is a smaller right effusion. There is interstitial edema. There is alveolar opacity in both lung bases. Heart is enlarged with pulmonary venous hypertension. Patient is status post mitral valve replacement. Central catheter tip is at the cavoatrial junction. No pneumothorax.   IMPRESSION: Evidence of congestive heart failure. Opacity in the lung bases may represent alveolar edema or possibly superimposed pneumonia. Both entities may exist concurrently. No pneumothorax.   Electronically Signed   By: Bretta Bang M.D.   On: 05/18/2014 07:26    I have reviewed the patient'[name] current medications.  Assessment/Plan: AKI: Patient [name]/p dialysis on [place]. Her potassium is good and her BUN and creatinine is increasing. Patient remains oliguric Problem#2 AMS; Etiology and her base line not clear. Her mental status seems fluctuating. She looks better today Problem#3 HCAP: Patient on antibiotics. She is afebrile but her white blood cell count still high Problem#4 Anemia: Her hemoglobin is with in our target goal and stalbe  Problem#5 Hypotension : Seems long standing .  Problem#6 Metabolic bone disease : Her calcium and her phosphorus is in range. Patient started on feeding and not on a binder Problem#7 CAD [name]/p CABG Problem#8 CHF? We will dialyze patient today and remove 21/2 liters if her blood pressure tolerates Basic metabolic panel/phosphorus /cbc in am   LOS: 4 days   [name] 05/18/2014,11:09 AM

## 2014-05-18 NOTE — Plan of Care (Signed)
Problem: Phase I Progression Outcomes Goal: Dyspnea controlled at rest Outcome: Progressing Goal: Pain controlled with appropriate interventions Outcome: Progressing Goal: OOB as tolerated unless otherwise ordered Outcome: Not Progressing Goal: Code status addressed with pt/family Outcome: Completed/Met Date Met:  05/18/14 Goal: Voiding-avoid urinary catheter unless indicated Outcome: Not Progressing Pt incontinent with stage 3 sacrococcygeal/ ischial ulcers Goal: Hemodynamically stable Outcome: Progressing  Problem: Phase II Progression Outcomes Goal: Encourage coughing & deep breathing Outcome: Progressing Goal: Tolerating diet Outcome: Progressing Pt tolerating tube feeding; Vital High Protein @ 73m/ hr which is goal

## 2014-05-18 NOTE — Progress Notes (Signed)
ANTIBIOTIC CONSULT NOTE   Pharmacy Consult for Vancomycin and Zosyn Indication: pneumonia  No Known Allergies  Patient Measurements: Height: 5\' 10"  (177.8 cm) Weight: 210 lb 3.2 oz (95.346 kg) IBW/kg (Calculated) : 68.5  Vital Signs: Temp: 98.5 F (36.9 C) (11/02 1206) Temp Source: Axillary (11/02 1206) BP: 101/57 mmHg (11/02 1345) Pulse Rate: 85 (11/02 1345) Intake/Output from previous day: 11/01 0701 - 11/02 0700 In: 1706.2 [I.V.:240; NG/GT:1012.2; IV Piggyback:274] Out: 275 [Urine:125; Stool:150] Intake/Output from this shift: Total I/O In: 302 [NG/GT:240; IV Piggyback:62] Out: -   Labs:  Recent Labs  05/16/14 0549 05/17/14 0652 05/18/14 0638  WBC 9.9 9.5 10.1  HGB 9.6* 9.4* 10.1*  PLT 281 279 272  CREATININE 2.66* 2.44* 3.27*   Estimated Creatinine Clearance: 18.6 mL/min (by C-G formula based on Cr of 3.27). No results for input(s): VANCOTROUGH, VANCOPEAK, VANCORANDOM, GENTTROUGH, GENTPEAK, GENTRANDOM, TOBRATROUGH, TOBRAPEAK, TOBRARND, AMIKACINPEAK, AMIKACINTROU, AMIKACIN in the last 72 hours.   Microbiology: Recent Results (from the past 720 hour(s))  Clostridium Difficile by PCR     Status: None   Collection Time: 04/19/14  5:54 PM  Result Value Ref Range Status   C difficile by pcr NEGATIVE NEGATIVE Final  Culture, blood (routine x 2) Call MD if unable to obtain prior to antibiotics being given     Status: None (Preliminary result)   Collection Time: 05/15/14  1:21 AM  Result Value Ref Range Status   Specimen Description BLOOD LEFT ANTECUBITAL  Final   Special Requests BOTTLES DRAWN AEROBIC AND ANAEROBIC 6CC  Final   Culture NO GROWTH <24 HRS  Final   Report Status PENDING  Incomplete  Culture, blood (routine x 2) Call MD if unable to obtain prior to antibiotics being given     Status: None (Preliminary result)   Collection Time: 05/15/14  1:21 AM  Result Value Ref Range Status   Specimen Description BLOOD LEFT ANTECUBITAL  Final   Special Requests  BOTTLES DRAWN AEROBIC AND ANAEROBIC 9CC EACH  Final   Culture NO GROWTH <24 HRS  Final   Report Status PENDING  Incomplete  MRSA PCR Screening     Status: Abnormal   Collection Time: 05/15/14  1:40 AM  Result Value Ref Range Status   MRSA by PCR POSITIVE ([name]) NEGATIVE Final    Comment:        The GeneXpert MRSA Assay (FDA approved for NASAL specimens only), is one component of [name] comprehensive MRSA colonization surveillance program. It is not intended to diagnose MRSA infection nor to guide or monitor treatment for MRSA infections. RESULT CALLED TO, READ BACK BY AND VERIFIED WITH: Brooke [name] AT 0435 ON [id number] BY FORSYTH K  Clostridium Difficile by PCR     Status: None   Collection Time: 05/15/14  3:19 PM  Result Value Ref Range Status   C difficile by pcr NEGATIVE NEGATIVE Final    Anti-infectives    Start     Dose/Rate Route Frequency Ordered Stop   05/18/14 1400  vancomycin (VANCOCIN) IVPB 1000 mg/200 mL premix     1,000 mg200 mL/hr over 60 Minutes Intravenous Every M-W-F (Hemodialysis) 05/17/14 0852     05/17/14 0300  levofloxacin (LEVAQUIN) IVPB 750 mg  Status:  Discontinued     750 mg100 mL/hr over 90 Minutes Intravenous Every 48 hours 05/15/14 0746 05/15/14 1034   05/17/14 0300  levofloxacin (LEVAQUIN) IVPB 500 mg  Status:  Discontinued     500 mg100 mL/hr over 60 Minutes Intravenous Every 48 hours  05/15/14 1034 05/15/14 1454   05/16/14 1200  vancomycin (VANCOCIN) IVPB 1000 mg/200 mL premix  Status:  Discontinued     1,000 mg200 mL/hr over 60 Minutes Intravenous Every [name]-[facility]-Sa (Hemodialysis) 05/15/14 1034 05/17/14 0852   05/15/14 2200  vancomycin (VANCOCIN) IVPB 1000 mg/200 mL premix  Status:  Discontinued     1,000 mg200 mL/hr over 60 Minutes Intravenous Every 24 hours 05/15/14 0746 05/15/14 1034   05/15/14 1400  piperacillin-tazobactam (ZOSYN) IVPB 3.375 g  Status:  Discontinued     3.375 g12.5 mL/hr over 240 Minutes Intravenous 3 times per day 05/15/14 0746 05/15/14 1034    05/15/14 1400  piperacillin-tazobactam (ZOSYN) IVPB 2.25 g     2.25 g100 mL/hr over 30 Minutes Intravenous 3 times per day 05/15/14 1034     05/15/14 0700  piperacillin-tazobactam (ZOSYN) IVPB 3.375 g  Status:  Discontinued     3.375 g12.5 mL/hr over 240 Minutes Intravenous  Once 05/15/14 0144 05/15/14 0144   05/15/14 0700  piperacillin-tazobactam (ZOSYN) IVPB 2.25 g     2.25 g100 mL/hr over 30 Minutes Intravenous  Once 05/15/14 0145 05/15/14 0739   05/15/14 0300  levofloxacin (LEVAQUIN) IVPB 750 mg     750 mg100 mL/hr over 90 Minutes Intravenous  Once 05/15/14 0204 05/15/14 0447   05/14/14 2200  piperacillin-tazobactam (ZOSYN) IVPB 3.375 g     3.375 g100 mL/hr over 30 Minutes Intravenous  Once 05/14/14 2155 05/14/14 2311   05/14/14 2200  vancomycin (VANCOCIN) IVPB 1000 mg/200 mL premix     1,000 mg200 mL/hr over 60 Minutes Intravenous  Once 05/14/14 2155 05/15/14 0016     Assessment: 73 yoF admitted from NH with worsening shortness of breath and altered mental status.  She has ESRD on HD [name]/S.  She was started on oral Levaquin prior to admission 10/29, now on Vancomycin and Zosyn IV.  CXR + LLL PNA.   She is afebrile.  Goal of Therapy:  Pre-HD Vancomycin level 15-25 mcg/ml  Plan:  Zosyn 2.25gm IV q8h Vancomycin 1gm IV qHD  Check pre-HD level at steady state Monitor patient progress & cx data  Brooke [name], Brooke [name] 05/18/2014,2:00 PM

## 2014-05-18 NOTE — Progress Notes (Signed)
Inpatient Diabetes Program Recommendations  AACE/ADA: New Consensus Statement on Inpatient Glycemic Control (2013)  Target Ranges:  Prepandial:   less than 140 mg/dL      Peak postprandial:   less than 180 mg/dL (1-2 hours)      Critically ill patients:  140 - 180 mg/dL   Results for [name], [name] (MRN [id number]) as of 05/18/2014 14:39  Ref. Range 05/17/2014 08:25 05/17/2014 11:41 05/17/2014 16:27 05/17/2014 19:36 05/17/2014 23:45 05/18/2014 04:04 05/18/2014 08:17 05/18/2014 11:01  Glucose-Capillary Latest Range: 70-99 mg/dL 329 (H) 518 (H) 841 (H) 193 (H) 160 (H) 133 (H) 250 (H) 218 (H)   Diabetes history: DM2 Outpatient Diabetes medications: Levemir 10 units BID, Novolog 0-12 units sliding scale Current orders for Inpatient glycemic control: Novolog 0-9 units Q4H  Inpatient Diabetes Program Recommendations Insulin - Basal: May want to consider ordering low dose basal insulin.  Thanks, Orlando Penner, RN, MSN, CCRN Diabetes Coordinator Inpatient Diabetes Program [phone] (Team Pager) [phone] (AP office) [phone] [facility] office)

## 2014-05-18 NOTE — Progress Notes (Addendum)
PROGRESS NOTE  [name] ONG:[id number] DOB: November 10, 1940 DOA: 05/14/2014 PCP: Mickle PlumbStone, Nancy, NP  Summary: 73yow hospitalized 01/25/2014-03/06/2014 with STEMI >> emergent CABG and MVR, post-op course prolonged vent, balloon pump, PEG placement, CVVHD >> HD, eventually transferred to LTAC then to [facility] last few weeks, developed diarrhea on pneumonia treated with Levaquin. Per staff has been declining, not eating, confused. Transferred to ED 10/29 for lethargy and hypoxia; diagnosed with HCAP, treated with BiPAP and admittedfor septic shock, acute respiratory failure and HCAP. She required BiPAP initially but respiratory status appears to be stabilizing as does septic shock. Plan to continue empiric antibiotics for it this. Multiple chronic wounds noted and prognosis remains guarded given poor nutritional state, limited function, encephalopathy, acute and chronic illnesses.   Assessment/Plan: 1. Acute hypoxic, hypercapnic respiratory failure secondary to HCAP. Remains critically ill but oxygenation has remained stable. 2. Septic shock secondary to HCAP. Intermittent hypertension persists, possibly chronic. Overall improving. 3. HCAP.appears to be responding to antibiotics. 4. Acute encephalopathy. Persists. Speech is intermittent.Noted to be confused August and September. Cardiology queried dementia.  5. Bilateral pleural effusions, not felt to large enough for therapeutic tappreviously. Chest x-ray today with mild increase in left pleural effusion, however given the oxygenation is stable I think volume issues are primarily related to need for dialysis. 6. Chronic systolic CHF LVEF 40-45% 02/2014. Appears euvolemic. 7. History of STEMI, CABG x4, MVR 01/2014, cardiogenic shock, prolonged vent, [facility]. 8. Multiple wounds bilateral LE, s/p PEG; wound care recs noted. per Dr. Lovell SheehanJenkins, he recommends chemical debridement at this point. Any surgical  debridement would require reversal of anticoagulation and poor healing of wound would be expected. Surgical intervention not recommended at this point. 9. Diarrhea cdiff negative likely secondary to TF, abx. 10. DM type 2 remains stable. 11. Atrial fibrillation on warfarin. INR therapeutic. 12. ESRD on hemodialysis. Hemodialysis plan again today.   Patient has improved significantly from admission but may be plateau-ing. Further improvement somewhat doubtful given comorbidities, nutritional and general clinical status.  Remain in stepdown continue empiric antibiotics for septic shock. Wean oxygen as tolerated. Further dialysis today.remains critically ill.  Continue wound care.  Need to consider central line in the next 24 hours. Not optimal candidate for PICC line given renal failure.  Wean oxygen as tolerated.  I updated her grandson/de facto power of attorney [name] evening by telephone. We will discuss again with him today. Prognosis remains guarded.  Code Status: DNR/DNI DVT prophylaxis: on warfarin, INR therapeutic Family Communication:  Disposition Plan: pending  Addendum 1800 Updated [name] by telephone 11/2 PM. Discussed limited improvement, wounds unlikely to heal, poor prognosis.  Brendia Sacksaniel Goodrich, MD  [facility]  Pager [phone] If 7PM-7AM, please contact night-coverage at [email or web address], password [email or web address] 05/18/2014, 9:02 AM  LOS: 4 days   Consultants:  Nephrology   Procedures:  Dialysis catheter in right subclavian present on admission  Antibiotics:  Vancomycin 10/30 >>  Zosyn 10/30 >>  Levaquin 10/30   HPI/Subjective: No issues overnight. Nursing does not IV access is becoming tenuous. Patient noted to be more alert.  Opens eyes, attempts to speak, does not offer history.  Objective: Filed Vitals:   05/18/14 0301 05/18/14 0400 05/18/14 0500 05/18/14 0600  BP: 92/46 94/48 95/46  107/69  Pulse: 84 80 79 87  Temp:  100 F (37.8 C)     TempSrc:  Axillary    Resp: 27 28 30 20   Height:  Weight:    93.3 kg (205 lb 11 oz)  SpO2: 99% 97% 96% 96%    Intake/Output Summary (Last 24 hours) at 05/18/14 0902 Last data filed at 05/18/14 0800  Gross per 24 hour  Intake 1706.17 ml  Output    275 ml  Net 1431.17 ml     Filed Weights   05/16/14 1429 05/17/14 0500 05/18/14 0600  Weight: 86.1 kg (189 lb 13.1 oz) 93.4 kg (205 lb 14.6 oz) 93.3 kg (205 lb 11 oz)    Exam:     Intermittent hypotension. Low-grade temp 100.  Appears calm, comfortable. Alert. Tracks with eyes. Attempts to speak but does not offer history. Does not follow commands. Appears to be confused.  Eyes appear grossly unremarkable.  ENT grossly unremarkable.  Cardiovascular regular rate and rhythm. No murmur, rub or gallop. 1+ bilateral lower extremity edema. Telemetry sinus rhythm.  Respiratory clear to auscultation right side. Left side with diminished breath sounds. No frank wheezes, rales or rhonchi. Normal respiratory effort.  Abdomen soft, nontender, nondistended. PEG tube appears unremarkable.  Skin unchanged.   Data Reviewed:  Minimal urine output. Stool output 150.  Basic metabolic panel consistent with end-stage renal disease.  Hemoglobin stable 10.1.INR 2.21.  Chest x-ray with evidence of heart failure. No pneumothorax. Left-sided effusion slightly larger.  Blood cultures no growth today.  Scheduled Meds: . antiseptic oral rinse  7 mL Mouth Rinse BID  . Chlorhexidine Gluconate Cloth  6 each Topical Q0600  . collagenase   Topical Daily  . epoetin alfa  10,000 Units Intravenous Once per day on Mon Wed Fri  . feeding supplement (VITAL HIGH PROTEIN)  1,000 mL Per Tube Q24H  . furosemide  120 mg Intravenous BID  . insulin aspart  0-9 Units Subcutaneous 6 times per day  . levalbuterol  0.63 mg Nebulization Q6H  . mupirocin ointment  1 application Nasal BID  . piperacillin-tazobactam (ZOSYN)  IV  2.25 g Intravenous 3 times per  day  . sodium chloride  3 mL Intravenous Q12H  . 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

## 2014-05-19 ENCOUNTER — Encounter: Payer: Self-pay | Admitting: Vascular Surgery

## 2014-05-19 ENCOUNTER — Inpatient Hospital Stay (HOSPITAL_COMMUNITY): Payer: Medicare Other

## 2014-05-19 LAB — BLOOD GAS, ARTERIAL
Acid-Base Excess: 1.9 mmol/L (ref 0.0–2.0)
Bicarbonate: 26.5 mEq/L — ABNORMAL HIGH (ref 20.0–24.0)
Drawn by: 23534
O2 Content: 2 L/min
O2 Saturation: 92.8 %
Patient temperature: 37
TCO2: 24.5 mmol/L (ref 0–100)
pCO2 arterial: 45.8 mmHg — ABNORMAL HIGH (ref 35.0–45.0)
pH, Arterial: 7.381 (ref 7.350–7.450)
pO2, Arterial: 67.9 mmHg — ABNORMAL LOW (ref 80.0–100.0)

## 2014-05-19 LAB — GLUCOSE, CAPILLARY
GLUCOSE-CAPILLARY: 259 mg/dL — AB (ref 70–99)
GLUCOSE-CAPILLARY: 319 mg/dL — AB (ref 70–99)
Glucose-Capillary: 249 mg/dL — ABNORMAL HIGH (ref 70–99)
Glucose-Capillary: 279 mg/dL — ABNORMAL HIGH (ref 70–99)
Glucose-Capillary: 284 mg/dL — ABNORMAL HIGH (ref 70–99)
Glucose-Capillary: 293 mg/dL — ABNORMAL HIGH (ref 70–99)

## 2014-05-19 LAB — PROTIME-INR
INR: 1.73 — ABNORMAL HIGH (ref 0.00–1.49)
PROTHROMBIN TIME: 20.4 s — AB (ref 11.6–15.2)

## 2014-05-19 LAB — LEGIONELLA ANTIGEN, URINE

## 2014-05-19 MED ORDER — WARFARIN SODIUM 2 MG PO TABS
2.0000 mg | ORAL_TABLET | Freq: Once | ORAL | Status: AC
  Administered 2014-05-19: 2 mg via ORAL
  Filled 2014-05-19: qty 1

## 2014-05-19 MED ORDER — WARFARIN - PHARMACIST DOSING INPATIENT
Status: DC
  Administered 2014-05-19 – 2014-05-22 (×2)

## 2014-05-19 NOTE — Progress Notes (Signed)
Inpatient Diabetes Program Recommendations  AACE/ADA: New Consensus Statement on Inpatient Glycemic Control (2013)  Target Ranges:  Prepandial:   less than 140 mg/dL      Peak postprandial:   less than 180 mg/dL (1-2 hours)      Critically ill patients:  140 - 180 mg/dL   Results for LE, INGS (MRN [id number]) as of 05/19/2014 09:04  Ref. Range 05/18/2014 04:04 05/18/2014 08:17 05/18/2014 11:01 05/18/2014 16:30 05/18/2014 19:28 05/19/2014 00:18 05/19/2014 04:58 05/19/2014 07:52  Glucose-Capillary Latest Range: 70-99 mg/dL 263 (H) 335 (H) 456 (H) 118 (H) 222 (H) 249 (H) 259 (H) 319 (H)   Diabetes history: DM2 Outpatient Diabetes medications: Levemir 10 units BID, Novolog 0-12 units sliding scale Current orders for Inpatient glycemic control: Novolog 0-9 units Q4H  Inpatient Diabetes Program Recommendations Insulin - Basal: Please consider ordering Levemir 10 units daily (to start now). Insulin -Tube Feeding Coverage: If CBGs remain elevated with addition of Levemir, may want to consider ordering Novolog 2 units Q4H for tube feeding coverage.  Thanks, Orlando Penner, RN, MSN, CCRN, CDE Diabetes Coordinator Inpatient Diabetes Program [phone] (Team Pager) [phone] ([place] office) [phone] [facility] office)

## 2014-05-19 NOTE — Progress Notes (Signed)
Subjective: Interval History: None  Objective: Vital signs in last 24 hours: Temp:  [97.4 F (36.3 C)-98.8 F (37.1 C)] 97.4 F (36.3 C) (11/03 0400) Pulse Rate:  [78-93] 93 (11/03 0600) Resp:  [18-36] 29 (11/03 0500) BP: (88-108)/(48-93) 105/93 mmHg (11/03 0600) SpO2:  [94 %-100 %] 98 % (11/03 0600) Weight:  [92 kg (202 lb 13.2 oz)-95.346 kg (210 lb 3.2 oz)] 92 kg (202 lb 13.2 oz) (11/03 0500) Weight change: 2.046 kg (4 lb 8.2 oz)  Intake/Output from previous day: 11/02 0701 - 11/03 0700 In: 682 [I.V.:80; NG/GT:240; IV Piggyback:312] Out: 2500  Intake/Output this shift:    Generally she is sommolent but aroussable, does not verbalize  . Chest : Clear to auscultation anteriorlly Heart RRR II/VI SEM Abdomen: None tender No edema  Lab Results:  Recent Labs  05/17/14 0652 05/18/14 0638  WBC 9.5 10.1  HGB 9.4* 10.1*  HCT 33.7* 35.5*  PLT 279 272   BMET:   Recent Labs  05/17/14 0652 05/18/14 0638  NA 143 144  K 3.9 4.1  CL 103 103  CO2 28 26  GLUCOSE 135* 166*  BUN 26* 35*  CREATININE 2.44* 3.27*  CALCIUM 8.2* 8.5   No results for input(s): PTH in the last 72 hours. Iron Studies: No results for input(s): IRON, TIBC, TRANSFERRIN, FERRITIN in the last 72 hours.  Studies/Results: Dg Chest Port 1 View  05/18/2014   CLINICAL DATA:  Difficulty breathing and hypotension; altered mental status  EXAM: PORTABLE CHEST - 1 VIEW  COMPARISON:  Chest radiograph May 14, 2014 chest CT May 15, 2014  FINDINGS: Left effusion is slightly larger. There is a smaller right effusion. There is interstitial edema. There is alveolar opacity in both lung bases. Heart is enlarged with pulmonary venous hypertension. Patient is status post mitral valve replacement. Central catheter tip is at the cavoatrial junction. No pneumothorax.  IMPRESSION: Evidence of congestive heart failure. Opacity in the lung bases may represent alveolar edema or possibly superimposed pneumonia. Both entities  may exist concurrently. No pneumothorax.   Electronically Signed   By: Bretta Bang M.D.   On: 05/18/2014 07:26    I have reviewed the patient's current medications.  Assessment/Plan: AKI: Patient s/p dialysis yesterday. Her potassium is normal Problem#2 AMS; Etiology and her base line not clear. Her mental status seems fluctuating. She is sleepy today Problem#3 HCAP: Patient on antibiotics. She is afebrile and her white blood cell count is normal Problem#4 Anemia: Her hemoglobin is with in our target goal and stalbe  Problem#5 Hypotension :  Problem#6 Metabolic bone disease : Her calcium and her phosphorus is in range. Patient started on feeding and not on a binder Problem#7 CAD s/p CABG Patient does not require dialysis today We will make arrangement for dialysis tomorrow Basic metabolic panel/phosphorus /cbc in am   LOS: 5 days   Glennis Borger S 05/19/2014,7:09 AM

## 2014-05-19 NOTE — Progress Notes (Signed)
ANTICOAGULATION CONSULT NOTE - Initial Consult  Pharmacy Consult for Coumadin (chronic Rx PTA) Indication: atrial fibrillation  No Known Allergies  Patient Measurements: Height: 5\' 10"  (177.8 cm) Weight: 202 lb 13.2 oz (92 kg) IBW/kg (Calculated) : 68.5  Vital Signs: Temp: 100.2 F (37.9 C) (11/03 0730) Temp Source: Axillary (11/03 0730) BP: 114/56 mmHg (11/03 1000) Pulse Rate: 82 (11/03 1000)  Labs:  Recent Labs  05/17/14 0652 05/18/14 0638 05/19/14 0831  HGB 9.4* 10.1*  --   HCT 33.7* 35.5*  --   PLT 279 272  --   LABPROT 28.9* 24.7* 20.4*  INR 2.70* 2.21* 1.73*  CREATININE 2.44* 3.27*  --    Estimated Creatinine Clearance: 18.3 mL/min (by C-G formula based on Cr of 3.27).  Medical History: Past Medical History  Diagnosis Date  . History of MI (myocardial infarction)     PCI done @ [facility] (cannot get cath report on Care Everywhere(  . Essential hypertension   . TIA (transient ischemic attack)   . Thyroid disease   . Anxiety   . Diabetes mellitus type 2 with peripheral artery disease     On insulin  . PAD (peripheral artery disease)   . Hyperlipidemia associated with type 2 diabetes mellitus   . A-fib   . ARF (acute renal failure)    Medications:  Prescriptions prior to admission  Medication Sig Dispense Refill Last Dose  . Amino Acids-Protein Hydrolys (FEEDING SUPPLEMENT, PRO-STAT SUGAR FREE 64,) LIQD Give 30 mLs by tube 3 (three) times daily.    05/14/2014 at Unknown time  . amiodarone (PACERONE) 200 MG tablet Give 200 mg by tube daily.   05/14/2014 at Unknown time  . aspirin EC 81 MG tablet Give 81 mg by tube daily.   05/14/2014 at Unknown time  . famotidine (PEPCID) 20 MG tablet Give 20 mg by tube 2 (two) times daily.   05/14/2014 at Unknown time  . fluconazole (DIFLUCAN) 150 MG tablet Take 150 mg by mouth daily.   05/14/2014 at Unknown time  . furosemide (LASIX) 20 MG tablet Give 20 mg by tube See admin instructions. Give 20mg  on Mon.,Wed.,Fri.,    05/13/2014 at Unknown time  . guaifenesin (ROBITUSSIN) 100 MG/5ML syrup Give 200 mg by tube 4 (four) times daily as needed for cough.    05/14/2014 at Unknown time  . Guar Gum (NUTRISOURCE FIBER) PACK Give 1 Package by tube daily.   unknown at Unknown time  . insulin aspart (NOVOLOG) 100 UNIT/ML injection Inject 0-12 Units into the skin See admin instructions. Sliding scale BG below 70= 0 units; 70 - 150 give 0 units; 151- 200 give 2 units; 201- 250 give 4 units; 251- 300 give 6 units; 301- 350 give 8 units; 351 -400 give 10 units; for BG above 400 give 12 units and call doctor   05/14/2014 at Unknown time  . insulin detemir (LEVEMIR) 100 UNIT/ML injection Inject 10 Units into the skin 2 (two) times daily.    05/14/2014 at Unknown time  . levofloxacin (LEVAQUIN) 500 MG tablet Place 500 mg into feeding tube daily. For 6 days (starting 05/14/2014)   05/14/2014 at Unknown time  . levothyroxine (SYNTHROID, LEVOTHROID) 88 MCG tablet Give 88 mcg by tube daily before breakfast.   05/14/2014 at Unknown time  . LORazepam (ATIVAN) 0.5 MG tablet Take 0.5 mg by mouth every 6 (six) hours as needed for anxiety.   05/12/2014  . metoCLOPramide (REGLAN) 5 MG tablet Give 5 mg by tube every  6 (six) hours.   05/14/2014 at Unknown time  . midodrine (PROAMATINE) 5 MG tablet Give 5 mg by tube 3 (three) times daily with meals.   05/14/2014 at Unknown time  . mirtazapine (REMERON) 15 MG tablet Give 15 mg by tube at bedtime.   05/13/2014 at Unknown time  . potassium chloride 20 MEQ/15ML (10%) solution Give 20 mEq by tube 2 (two) times daily.    05/14/2014 at Unknown time  . sertraline (ZOLOFT) 50 MG tablet Give 50 mg by tube daily.   05/14/2014 at Unknown time  . sevelamer carbonate (RENVELA) 2.4 G PACK Give 2.4 g by tube daily.   05/14/2014 at Unknown time  . vitamin D, CHOLECALCIFEROL, 400 UNITS tablet Place 400 Units into feeding tube 2 (two) times daily.   05/14/2014 at Unknown time  . warfarin (COUMADIN) 2 MG tablet Take  2 mg by mouth daily.   05/14/2014 at 1700  . acetaminophen (TYLENOL) 325 MG tablet Give 650 mg by tube every 6 (six) hours as needed for moderate pain or fever.   unknown  . acetaminophen (TYLENOL) 650 MG suppository Place 650 mg rectally every 6 (six) hours as needed for mild pain.   unknown  . dextrose (GLUTOSE) 40 % GEL Give 1 Tube by tube as needed for low blood sugar.    unknown  . dextrose 50 % solution Inject 11-25 mLs into the vein as needed for low blood sugar. Sliding scale: for low BG 25ml < 22; 23ml = 22-28; 21ml = 29-35; 19ml = 36-41; 17ml = 42-48; 15ml = 49- 55; 8113ml= 56 - 61; 4111ml= 62- 70; call md >70   unknown  . glucagon, human recombinant, (GLUCAGEN DIAGNOSTIC) 1 MG injection Inject 1 mg into the vein once as needed for low blood sugar.   unknown  . nitroGLYCERIN (NITROSTAT) 0.4 MG SL tablet Place 0.4 mg under the tongue every 5 (five) minutes as needed for chest pain.   unknown   Assessment: 73yo female on chronic Coumadin PTA for h/o afib.  INR was therapeutic on admission but has trended down to below goal.  Pt was in ICU with diarrhea and not eating therefore has not received any Coumadin during this admission prior to today.  Asked to resume Coumadin.  Goal of Therapy:  INR 2-3 Monitor platelets by anticoagulation protocol: Yes   Plan:  Coumadin 2mg  po today x 1 INR daily  [name] A 05/19/2014,1:28 PM

## 2014-05-19 NOTE — Progress Notes (Signed)
[facility] PROGRESS NOTE  [name] WUJ:[id number] DOB: 1940/09/12 DOA: 05/14/2014 PCP: Mickle PlumbStone, Nancy, NP  Assessment/Plan: 73yow hospitalized 01/25/2014-03/06/2014 with STEMI >> emergent CABG and MVR, post-op course prolonged vent, balloon pump, PEG placement, CVVHD >> HD, eventually transferred to LTAC then to [facility] last few weeks, developed diarrhea on pneumonia treated with Levaquin. Per staff has been declining, not eating, confused. Transferred to ED 10/29 for lethargy and hypoxia; diagnosed with HCAP, treated with BiPAP and admittedfor septic shock, acute respiratory failure and HCAP. She required BiPAP initially but respiratory status appears to be stabilizing as does septic shock. Plan to continue empiric antibiotics for it this. Multiple chronic wounds noted and prognosis remains guarded given poor nutritional state, limited function, encephalopathy, acute and chronic illnesses.   1. Acute hypoxic, hypercapnic respiratory failure secondary to HCAP. Remains critically ill but oxygenation has remained stable. -cont prn bronchodilators, oxygen; treat pneumonia; cont HD for volume control;  2. Septic shock secondary to HCAP. Intermittent hypertension persists, possibly chronic.  -cont IV atx, f/u cultures: NGTD 3. Dementia; acute encephalopathy. Persists. Speech is intermittent.Noted to be confused August and September.  4. Bilateral pleural effusions, not felt to large enough for therapeutic tappreviously. Chest x-ray with mild increase in left pleural effusion, however given the oxygenation is stable I think volume issues are primarily related to need for dialysis. Will monitor closely  5. Chronic systolic CHF LVEF 40-45% 02/2014. Appears euvolemic. 6. History of STEMI, CABG x4, MVR 01/2014, cardiogenic shock, prolonged vent, [facility], [facility] then to [facility]. 7. Multiple wounds bilateral LE, s/p PEG; wound care recs noted. per Dr. Lovell SheehanJenkins, he recommends  chemical debridement at this point. Any surgical debridement would require reversal of anticoagulation and poor healing of wound would be expected. Surgical intervention not recommended at this point. 8. Diarrhea cdiff negative likely secondary to TF, abx. 9. DM type 2 cont ISS:  10. Atrial fibrillation on warfarin. Resume coumadin  11. ESRD on hemodialysis.    Pt had no IV access; but needs urgent IV medications;  -Under sterile condition L IJ central cath placed under US guidance without immediate complications   Prognosis is guarded   Code Status: DNR Family Communication: d/w patient; called updated  [name],Brooke Lucila MaineGrandson [phone]    (indicate person spoken with, relationship, and if by phone, the number) Disposition Plan: pend clinical improvement    Consultants:  Nephrology  Procedures:  Dialysis catheter in right subclavian present on admission  Antibiotics:  Vancomycin 10/30 >>  Zosyn 10/30 >>  Levaquin 10/30   HPI/Subjective: alert  Objective: Filed Vitals:   05/19/14 1000  BP: 114/56  Pulse: 82  Temp:   Resp: 37    Intake/Output Summary (Last 24 hours) at 05/19/14 1247 Last data filed at 05/19/14 0958  Gross per 24 hour  Intake    430 ml  Output   2500 ml  Net  -2070 ml   Filed Weights   05/18/14 0600 05/18/14 1230 05/19/14 0500  Weight: 93.3 kg (205 lb 11 oz) 95.346 kg (210 lb 3.2 oz) 92 kg (202 lb 13.2 oz)    Exam:   General:  Alert, confused  Cardiovascular: s1,s2 rrr  Respiratory: CTA BL  Abdomen: soft, nt,nd   Musculoskeletal: no    Data Reviewed: Basic Metabolic Panel:  Recent Labs Lab 05/14/14 2055 05/15/14 0151 05/16/14 0549 05/17/14 0652 05/18/14 0638  NA 135* 136* 134* 143 144  K 4.0 3.8 4.2 3.9 4.1  CL 98 97 90* 103 103  CO2 24 26 24 28 26   GLUCOSE 288* 241* 235* 135* 166*  BUN 25* 29* 39* 26* 35*  CREATININE 1.73* 1.89* 2.66* 2.44* 3.27*  CALCIUM 8.1* 8.2* 7.9* 8.2* 8.5  PHOS  --   --  4.4 3.7 4.5    Liver Function Tests:  Recent Labs Lab 05/14/14 2055  AST 26  ALT 13  ALKPHOS 124*  BILITOT 0.4  PROT 6.8  ALBUMIN 2.1*   No results for input(s): LIPASE, AMYLASE in the last 168 hours. No results for input(s): AMMONIA in the last 168 hours. CBC:  Recent Labs Lab 05/14/14 2055 05/15/14 0151 05/16/14 0549 05/17/14 0652 05/18/14 0638  WBC 11.3* 12.3* 9.9 9.5 10.1  NEUTROABS 9.6* 8.3*  --   --   --   HGB 10.4* 10.3* 9.6* 9.4* 10.1*  HCT 36.1 35.6* 33.6* 33.7* 35.5*  MCV 95.8 95.2 96.6 98.8 98.6  PLT 283 280 281 279 272   Cardiac Enzymes:  Recent Labs Lab 05/14/14 2055 05/15/14 0151 05/15/14 0749 05/15/14 1338  TROPONINI <0.30 <0.30 <0.30 <0.30   BNP (last 3 results)  Recent Labs  03/16/14 0525 03/23/14 0500 05/14/14 2028  PROBNP 29632.0* 27500.0* 31667.0*   CBG:  Recent Labs Lab 05/18/14 1630 05/18/14 1928 05/19/14 0018 05/19/14 0458 05/19/14 0752  GLUCAP 118* 222* 249* 259* 319*    Recent Results (from the past 240 hour(s))  Culture, blood (routine x 2) Call MD if unable to obtain prior to antibiotics being given     Status: None (Preliminary result)   Collection Time: 05/15/14  1:21 AM  Result Value Ref Range Status   Specimen Description BLOOD LEFT ANTECUBITAL  Final   Special Requests BOTTLES DRAWN AEROBIC AND ANAEROBIC 6CC  Final   Culture NO GROWTH 4 DAYS  Final   Report Status PENDING  Incomplete  Culture, blood (routine x 2) Call MD if unable to obtain prior to antibiotics being given     Status: None (Preliminary result)   Collection Time: 05/15/14  1:21 AM  Result Value Ref Range Status   Specimen Description BLOOD LEFT ANTECUBITAL  Final   Special Requests BOTTLES DRAWN AEROBIC AND ANAEROBIC 9CC EACH  Final   Culture NO GROWTH 4 DAYS  Final   Report Status PENDING  Incomplete  MRSA PCR Screening     Status: Abnormal   Collection Time: 05/15/14  1:40 AM  Result Value Ref Range Status   MRSA by PCR POSITIVE (A) NEGATIVE Final     Comment:        The GeneXpert MRSA Assay (FDA approved for NASAL specimens only), is one component of a comprehensive MRSA colonization surveillance program. It is not intended to diagnose MRSA infection nor to guide or monitor treatment for MRSA infections. RESULT CALLED TO, READ BACK BY AND VERIFIED WITH: NEILSON T AT 0435 ON [id number] BY FORSYTH K  Clostridium Difficile by PCR     Status: None   Collection Time: 05/15/14  3:19 PM  Result Value Ref Range Status   C difficile by pcr NEGATIVE NEGATIVE Final     Studies: Dg Chest Port 1 View  05/18/2014   CLINICAL DATA:  Difficulty breathing and hypotension; altered mental status  EXAM: PORTABLE CHEST - 1 VIEW  COMPARISON:  Chest radiograph May 14, 2014 chest CT May 15, 2014  FINDINGS: Left effusion is slightly larger. There is a smaller right effusion. There is interstitial edema. There is alveolar opacity in both lung  bases. Heart is enlarged with pulmonary venous hypertension. Patient is status post mitral valve replacement. Central catheter tip is at the cavoatrial junction. No pneumothorax.  IMPRESSION: Evidence of congestive heart failure. Opacity in the lung bases may represent alveolar edema or possibly superimposed pneumonia. Both entities may exist concurrently. No pneumothorax.   Electronically Signed   By: Bretta Bang M.D.   On: 05/18/2014 07:26    Scheduled Meds: . antiseptic oral rinse  7 mL Mouth Rinse BID  . collagenase   Topical Daily  . epoetin alfa  10,000 Units Intravenous Once per day on Mon Wed Fri  . feeding supplement (VITAL HIGH PROTEIN)  1,000 mL Per Tube Q24H  . furosemide  120 mg Intravenous BID  . insulin aspart  0-9 Units Subcutaneous 6 times per day  . levalbuterol  0.63 mg Nebulization Q6H  . mupirocin ointment  1 application Nasal BID  . piperacillin-tazobactam (ZOSYN)  IV  2.25 g Intravenous 3 times per day  . sodium chloride  3 mL Intravenous Q12H  . 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 If 7PM-7AM, please contact night-coverage at [email or web address], password [facility] 05/19/2014, 12:47 PM  LOS: 5 days

## 2014-05-19 NOTE — Progress Notes (Signed)
The pt was swatting her hands trying to push away her neb for her breathing treatment. I was able to talk her into taking the breathing treatment by letting her know the treatment would only last a little while, only about 5-10 min. I do not believe she will be willing to wear BIPAP after I had to talk her into taking both of her treatments I have given her tonight. RN aware!

## 2014-05-20 ENCOUNTER — Encounter: Payer: Medicare Other | Admitting: Vascular Surgery

## 2014-05-20 ENCOUNTER — Other Ambulatory Visit (HOSPITAL_COMMUNITY): Payer: Medicare Other

## 2014-05-20 LAB — CBC
HEMATOCRIT: 35.9 % — AB (ref 36.0–46.0)
Hemoglobin: 10.1 g/dL — ABNORMAL LOW (ref 12.0–15.0)
MCH: 28 pg (ref 26.0–34.0)
MCHC: 28.1 g/dL — AB (ref 30.0–36.0)
MCV: 99.4 fL (ref 78.0–100.0)
Platelets: 251 10*3/uL (ref 150–400)
RBC: 3.61 MIL/uL — ABNORMAL LOW (ref 3.87–5.11)
RDW: 27.7 % — ABNORMAL HIGH (ref 11.5–15.5)
WBC: 11.6 10*3/uL — ABNORMAL HIGH (ref 4.0–10.5)

## 2014-05-20 LAB — CULTURE, BLOOD (ROUTINE X 2)
CULTURE: NO GROWTH
CULTURE: NO GROWTH

## 2014-05-20 LAB — BASIC METABOLIC PANEL
ANION GAP: 13 (ref 5–15)
BUN: 43 mg/dL — AB (ref 6–23)
CO2: 28 meq/L (ref 19–32)
CREATININE: 3.25 mg/dL — AB (ref 0.50–1.10)
Calcium: 8.4 mg/dL (ref 8.4–10.5)
Chloride: 103 mEq/L (ref 96–112)
GFR calc Af Amer: 15 mL/min — ABNORMAL LOW (ref >90)
GFR calc non Af Amer: 13 mL/min — ABNORMAL LOW (ref >90)
Glucose, Bld: 315 mg/dL — ABNORMAL HIGH (ref 70–99)
Potassium: 3.8 mEq/L (ref 3.7–5.3)
Sodium: 144 mEq/L (ref 137–147)

## 2014-05-20 LAB — GLUCOSE, CAPILLARY
GLUCOSE-CAPILLARY: 268 mg/dL — AB (ref 70–99)
GLUCOSE-CAPILLARY: 280 mg/dL — AB (ref 70–99)
GLUCOSE-CAPILLARY: 287 mg/dL — AB (ref 70–99)
Glucose-Capillary: 293 mg/dL — ABNORMAL HIGH (ref 70–99)
Glucose-Capillary: 220 mg/dL — ABNORMAL HIGH (ref 70–99)
Glucose-Capillary: 232 mg/dL — ABNORMAL HIGH (ref 70–99)

## 2014-05-20 LAB — PROTIME-INR
INR: 1.39 (ref 0.00–1.49)
PROTHROMBIN TIME: 17.2 s — AB (ref 11.6–15.2)

## 2014-05-20 MED ORDER — WARFARIN SODIUM 2 MG PO TABS
4.0000 mg | ORAL_TABLET | Freq: Once | ORAL | Status: AC
  Administered 2014-05-20: 4 mg via ORAL
  Filled 2014-05-20: qty 2

## 2014-05-20 MED ORDER — DEXTROSE 5 % IV SOLN
1.0000 g | INTRAVENOUS | Status: DC
  Filled 2014-05-20 (×2): qty 1

## 2014-05-20 MED ORDER — DEXTROSE 5 % IV SOLN
2.0000 g | INTRAVENOUS | Status: DC
  Administered 2014-05-20 – 2014-05-22 (×2): 2 g via INTRAVENOUS
  Filled 2014-05-20 (×4): qty 2

## 2014-05-20 MED ORDER — INSULIN GLARGINE 100 UNIT/ML ~~LOC~~ SOLN
20.0000 [IU] | Freq: Two times a day (BID) | SUBCUTANEOUS | Status: DC
  Administered 2014-05-20 (×2): 20 [IU] via SUBCUTANEOUS
  Filled 2014-05-20 (×3): qty 0.2

## 2014-05-20 NOTE — Progress Notes (Signed)
Nutrition Follow-up  INTERVENTION:   Continuous Vital High Protein at 60 ml/hr via PEG.  Tube feeding regimen provides 1440 kcal, 125 gr of protein, and 1203 ml of H2O.   NUTRITION DIAGNOSIS: Increased protein-energy needs related to wound healing as evidenced by current nutrition  guidelines.   Goal: Meet nutrition requirements based on pt care decisions  Monitor: nutrition support, skin assessments, labs and weight changes  73 y.o. female  Admitting Dx: Septic shock  ASSESSMENT: Pt is from [facility]. HX of MI, HTN, TIA, Thyriod dz, PAD, CABG, CHF, DM type 2 (04/10/14-A1C% 6.4). PEG (20 Fr.) placed during hospitalization in August.  Pt has HCAP with septic shock, acute encephalopathy, diarrhea, multiple wounds and ESRD with HD which was initiated in August.   Pt dialyized today. Wound showing improvement. Tolerating EN feeding without interruption today ( 5 ml residual).   Height: Ht Readings from Last 1 Encounters:  05/15/14 5\' 10"  (1.778 m)    Weight: Wt Readings from Last 1 Encounters:  05/20/14 200 lb 6.4 oz (90.9 kg)    Ideal Body Weight: 150# (68 kg)   Wt Readings from Last 10 Encounters:  05/20/14 200 lb 6.4 oz (90.9 kg)  03/06/14 189 lb 9.5 oz (86 kg)    Usual Body Weight: 190#   BMI:  Body mass index is 28.75 kg/(m^2).  overweight  Estimated Nutritional Needs: Kcal: 1500-1700 Protein: 115-133 gr Fluid: 1200 ml daily  Skin: multiple wounds: right heel deep tissue injury, left heel deep tissue injury,  unstagable to buttocks and right ischium, right anterior foot deep tissue injury  Diet Order:  NPO   EDUCATION NEEDS: -No education needs identified at this time   Intake/Output Summary (Last 24 hours) at 05/20/14 0850 Last data filed at 05/20/14 0750  Gross per 24 hour  Intake    970 ml  Output    100 ml  Net    870 ml    Last BM: 11/2 rectal tube (1000 ml output past 2 days)  Labs:   Recent Labs Lab 05/16/14 0549 05/17/14 0652  05/18/14 0638 05/20/14 0451  NA 134* 143 144 144  K 4.2 3.9 4.1 3.8  CL 90* 103 103 103  CO2 24 28 26 28   BUN 39* 26* 35* 43*  CREATININE 2.66* 2.44* 3.27* 3.25*  CALCIUM 7.9* 8.2* 8.5 8.4  PHOS 4.4 3.7 4.5  --   GLUCOSE 235* 135* 166* 315*    CBG (last 3)   Recent Labs  05/20/14 0018 05/20/14 0436 05/20/14 0748  GLUCAP 287* 268* 293*    Scheduled Meds: . antiseptic oral rinse  7 mL Mouth Rinse BID  . ceFEPime (MAXIPIME) IV  1 g Intravenous Q24H  . collagenase   Topical Daily  . epoetin alfa  10,000 Units Intravenous Once per day on Mon Wed Fri  . feeding supplement (VITAL HIGH PROTEIN)  1,000 mL Per Tube Q24H  . furosemide  120 mg Intravenous BID  . insulin aspart  0-9 Units Subcutaneous 6 times per day  . levalbuterol  0.63 mg Nebulization Q6H  . sodium chloride  3 mL Intravenous Q12H  . vancomycin  1,000 mg Intravenous Q M,W,F-HD  . Warfarin - Pharmacist Dosing Inpatient   Does not apply Q24H    Continuous Infusions:   Past Medical History  Diagnosis Date  . History of MI (myocardial infarction)     [facility] (cannot get cath report on Care Everywhere(  . Essential hypertension   .  TIA (transient ischemic attack)   . Thyroid disease   . Anxiety   . Diabetes mellitus type 2 with peripheral artery disease     On insulin  . PAD (peripheral artery disease)   . Hyperlipidemia associated with type 2 diabetes mellitus   . A-fib   . ARF (acute renal failure)     Past Surgical History  Procedure Laterality Date  . Cardiac catheterization      PCI - unknown vessel or stent; unknown date; @ [facility]  . Abdominal hysterectomy    . Cholecystectomy    . Coronary artery bypass graft N/A 01/25/2014    Procedure: CORONARY ARTERY BYPASS GRAFTING TIMES FOUR USING LEFT INTERNAL MAMMARY ARTERY TO LAD, SAPHENOUS VEIN GRAFTS TO OM1, OM2, AND PDA;  Surgeon: Kerin Perna, MD;  Location: [facility] OR;  Service: Open Heart Surgery;  Laterality: N/A;  . Mitral valve  replacement N/A 01/25/2014    Procedure: MITRAL VALVE (MV) REPLACEMENT;  Surgeon: Kerin Perna, MD;  Location: [facility] OR;  Service: Open Heart Surgery;  Laterality: N/A;  . Av fistula placement Right 04/09/2014    Procedure: ARTERIOVENOUS (AV) FISTULA CREATION;  Surgeon: Chuck Hint, MD;  Location: [facility] OR;  Service: Vascular;  Laterality: Right;    Royann Shivers MS,RD,CSG,LDN Office: [phone] Pager: [phone]

## 2014-05-20 NOTE — Procedures (Signed)
   HEMODIALYSIS TREATMENT NOTE:  4 hour heparin-free dialysis completed via right chest wall tunneled catheter.  Exit site erythematous with scant purulent drainage from an old suture site near the catheter exit site.  Tunnel is tender but without drainage at this time.  Findings discussed with Dr. Theador Hawthorne prior to starting HD.  Goal NOT met:  BP unable to tolerate removal of 2.5 liters while maintaining SBP>90.  Ultrafiltration was interrupted x 60 minutes for SBP high 80s.  Vancomycin 1g and EPO 10Ku given with HD.  Maxipime 2g not available from pharmacy; administration deferred to primary RN.  All blood was reinfused and report was given to [facility], Therapist, sports.  Brooke Denk L. Megon Kalina, RN, CDN

## 2014-05-20 NOTE — Progress Notes (Signed)
ANTICOAGULATION CONSULT NOTE -  Pharmacy Consult for Coumadin (chronic Rx PTA) Indication: atrial fibrillation  No Known Allergies  Patient Measurements: Height: 5\' 10"  (177.8 cm) Weight: 201 lb 9.6 oz (91.445 kg) IBW/kg (Calculated) : 68.5  Vital Signs: Temp: 98.3 F (36.8 C) (11/04 0750) Temp Source: Axillary (11/04 1000) BP: 86/47 mmHg (11/04 1415) Pulse Rate: 85 (11/04 1420)  Labs:  Recent Labs  05/18/14 0638 05/19/14 0831 05/20/14 0451 05/20/14 1419  HGB 10.1*  --  10.1*  --   HCT 35.5*  --  35.9*  --   PLT 272  --  251  --   LABPROT 24.7* 20.4*  --  17.2*  INR 2.21* 1.73*  --  1.39  CREATININE 3.27*  --  3.25*  --    Estimated Creatinine Clearance: 18.3 mL/min (by C-G formula based on Cr of 3.25).  Medical History: Past Medical History  Diagnosis Date  . History of MI (myocardial infarction)     PCI done @ [facility] (cannot get cath report on Care Everywhere(  . Essential hypertension   . TIA (transient ischemic attack)   . Thyroid disease   . Anxiety   . Diabetes mellitus type 2 with peripheral artery disease     On insulin  . PAD (peripheral artery disease)   . Hyperlipidemia associated with type 2 diabetes mellitus   . A-fib   . ARF (acute renal failure)    Medications:  Prescriptions prior to admission  Medication Sig Dispense Refill Last Dose  . Amino Acids-Protein Hydrolys (FEEDING SUPPLEMENT, PRO-STAT SUGAR FREE 64,) LIQD Give 30 mLs by tube 3 (three) times daily.    05/14/2014 at Unknown time  . amiodarone (PACERONE) 200 MG tablet Give 200 mg by tube daily.   05/14/2014 at Unknown time  . aspirin EC 81 MG tablet Give 81 mg by tube daily.   05/14/2014 at Unknown time  . famotidine (PEPCID) 20 MG tablet Give 20 mg by tube 2 (two) times daily.   05/14/2014 at Unknown time  . fluconazole (DIFLUCAN) 150 MG tablet Take 150 mg by mouth daily.   05/14/2014 at Unknown time  . furosemide (LASIX) 20 MG tablet Give 20 mg by tube See admin instructions.  Give 20mg  on Mon.,Wed.,Fri.,   05/13/2014 at Unknown time  . guaifenesin (ROBITUSSIN) 100 MG/5ML syrup Give 200 mg by tube 4 (four) times daily as needed for cough.    05/14/2014 at Unknown time  . Guar Gum (NUTRISOURCE FIBER) PACK Give 1 Package by tube daily.   unknown at Unknown time  . insulin aspart (NOVOLOG) 100 UNIT/ML injection Inject 0-12 Units into the skin See admin instructions. Sliding scale BG below 70= 0 units; 70 - 150 give 0 units; 151- 200 give 2 units; 201- 250 give 4 units; 251- 300 give 6 units; 301- 350 give 8 units; 351 -400 give 10 units; for BG above 400 give 12 units and call doctor   05/14/2014 at Unknown time  . insulin detemir (LEVEMIR) 100 UNIT/ML injection Inject 10 Units into the skin 2 (two) times daily.    05/14/2014 at Unknown time  . levofloxacin (LEVAQUIN) 500 MG tablet Place 500 mg into feeding tube daily. For 6 days (starting 05/14/2014)   05/14/2014 at Unknown time  . levothyroxine (SYNTHROID, LEVOTHROID) 88 MCG tablet Give 88 mcg by tube daily before breakfast.   05/14/2014 at Unknown time  . LORazepam (ATIVAN) 0.5 MG tablet Take 0.5 mg by mouth every 6 (six) hours as needed  for anxiety.   05/12/2014  . metoCLOPramide (REGLAN) 5 MG tablet Give 5 mg by tube every 6 (six) hours.   05/14/2014 at Unknown time  . midodrine (PROAMATINE) 5 MG tablet Give 5 mg by tube 3 (three) times daily with meals.   05/14/2014 at Unknown time  . mirtazapine (REMERON) 15 MG tablet Give 15 mg by tube at bedtime.   05/13/2014 at Unknown time  . potassium chloride 20 MEQ/15ML (10%) solution Give 20 mEq by tube 2 (two) times daily.    05/14/2014 at Unknown time  . sertraline (ZOLOFT) 50 MG tablet Give 50 mg by tube daily.   05/14/2014 at Unknown time  . sevelamer carbonate (RENVELA) 2.4 G PACK Give 2.4 g by tube daily.   05/14/2014 at Unknown time  . vitamin D, CHOLECALCIFEROL, 400 UNITS tablet Place 400 Units into feeding tube 2 (two) times daily.   05/14/2014 at Unknown time  .  warfarin (COUMADIN) 2 MG tablet Take 2 mg by mouth daily.   05/14/2014 at 1700  . acetaminophen (TYLENOL) 325 MG tablet Give 650 mg by tube every 6 (six) hours as needed for moderate pain or fever.   unknown  . acetaminophen (TYLENOL) 650 MG suppository Place 650 mg rectally every 6 (six) hours as needed for mild pain.   unknown  . dextrose (GLUTOSE) 40 % GEL Give 1 Tube by tube as needed for low blood sugar.    unknown  . dextrose 50 % solution Inject 11-25 mLs into the vein as needed for low blood sugar. Sliding scale: for low BG 25ml < 22; 23ml = 22-28; 21ml = 29-35; 19ml = 36-41; 17ml = 42-48; 15ml = 49- 55; 5113ml= 56 - 61; 1011ml= 62- 70; call md >70   unknown  . glucagon, human recombinant, (GLUCAGEN DIAGNOSTIC) 1 MG injection Inject 1 mg into the vein once as needed for low blood sugar.   unknown  . nitroGLYCERIN (NITROSTAT) 0.4 MG SL tablet Place 0.4 mg under the tongue every 5 (five) minutes as needed for chest pain.   unknown   Assessment: 73yo female on chronic Coumadin PTA for h/o afib.  INR was therapeutic on admission but has trended down to below goal.  Pt was in ICU with diarrhea and not eating therefore has not received any Coumadin during this admission prior to 07/19/2013.  Asked to resume Coumadin. INR subtherapeutic today   Goal of Therapy:  INR 2-3 Monitor platelets by anticoagulation protocol: Yes   Plan:  Coumadin 4 mg po today x 1 INR daily  Raquel JamesPittman, Ermal Brzozowski Bennett 05/20/2014,3:15 PM

## 2014-05-20 NOTE — Progress Notes (Signed)
Inpatient Diabetes Program Recommendations  AACE/ADA: New Consensus Statement on Inpatient Glycemic Control (2013)  Target Ranges:  Prepandial:   less than 140 mg/dL      Peak postprandial:   less than 180 mg/dL (1-2 hours)      Critically ill patients:  140 - 180 mg/dL   Results for [name], [name] (MRN [id number]) as of 05/20/2014 11:21  Ref. Range 05/19/2014 04:58 05/19/2014 07:52 05/19/2014 13:07 05/19/2014 16:30 05/19/2014 19:51 05/20/2014 00:18 05/20/2014 04:36 05/20/2014 07:48  Glucose-Capillary Latest Range: 70-99 mg/dL 355 (H) 974 (H) 163 (H) 284 (H) 279 (H) 287 (H) 268 (H) 293 (H)   Diabetes history: DM2 Outpatient Diabetes medications: Levemir 10 units BID, Novolog 0-12 units sliding scale Current orders for Inpatient glycemic control: Novolog 0-9 units Q4H  Inpatient Diabetes Program Recommendations Insulin - Basal: Please consider ordering Levemir 10 units daily (to start now). Insulin -Tube Feeding Coverage: Please consider ordering Novolog 2 units Q4H for tube feeding coverage.  Note: CBG ranged from 249-319 mg/dl on 84/5/36 and patient received a total of Novolog 30 units for correction on 05/19/14. CBGs have ranged from 268-293 mg/dl so far today and patient has received a total of Novolog 15 units so far today. Please consider ordering Levemir 10 units daily and Novolog 2 units Q4H for tube feeding coverage.  Thanks, Orlando Penner, RN, MSN, CCRN, CDE Diabetes Coordinator Inpatient Diabetes Program [phone] (Team Pager) [phone] (AP office) [phone] [facility] office)

## 2014-05-20 NOTE — Clinical Social Work Note (Signed)
CSW left a message for [name] requesting return contact.  [name], DSS Adult Pensions consultant. [name] indicated that she was working with patient and her family.  She indicated that patient's grandson, [name], has been attempting to manage patient's affairs. She indicated that she had been assisting [name] in obtaining guardianship however he continued to have difficulty completing all the steps necessary to do this.  [name] indicated that [name] had informed her that patient "might not make it." [name] indicated that she would be sending over a request for medical records on patient.  CSW provided [name] the number to medical records [phone].  CSW advised [name] to contact medical records to obtain the fax number.  [name] indicated that she would return contact to CSW related to guardianship and other aspects of patient's case.    [name], [place] [phone]

## 2014-05-20 NOTE — Progress Notes (Signed)
[facility] PROGRESS NOTE Interim History: 73 year old with recent hospitalized from 01/25/2014 08/25/2019 2015 with a STEMI leading to emergent CABG and mitral valve replacement postop course complicated and prolonged with balloon pump placement, CVVHD leading to HD, transfer to LTAC then to a skilled nursing facility where he developed diarrhea and pneumonia treated with Levaquin. As per staff reports his condition declined as he was not eating and remained confused admitted on 1029 with lethargy and hypoxia and diagnosed with H Treated with BiPAP and septic shock he did not require intubation.   Assessment/Plan: Acute respiratory failure with hypoxia secondary to HCAP: - Remains critically ill BiPAP at 35% FiO2. De-escalate - Continue bronchodilators. - Antibiotic vancomycin, Zosyn and Levaquin started on 05/15/2014 we'll de-escalate to cefepime. - White count continues to fluctuate, has remained afebrile. - Nephrology following appreciate assistance continue HD for volume control.  Septic shock: - Due to HCAP. - Continue IV antibiotics continue to follow cultures.  Acute encephalopathy: - On superimposed dementia, continues to be confused.  Bilateral pleural effusion: - not large enough for thoracocentesis, his oxygenation remained stable to slightly improved. - Most likely due to age. - RIGHT THORACENTESIS - 02/06/2014  Chronic systolic CHF LVEF 40-45% 02/2014: - Appears euvolemic. - Volume control per nephrology with HD. - Also on high-dose Lasix.  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 - Goal INR to be between 2.5 and 3.5 for prostatic valve.  Multiple wounds bilateral LE, s/p PEG;  - Wound care recs noted. per Dr. Lovell Sheehan, he recommends chemical debridement at this point.  - No Surgical intervention not recommended at this point.  Diarrhea cdiff negative  likely secondary to TF, abx.  DM type 2: - cont ISS:   Atrial fibrillation on warfarin.  - Continue Coumadin.  Code Status: DNR Family Communication: none  Disposition Plan: inpatient   Consultants:  nephrology  Procedures:  Diagnosis right subclavian catheter  Antibiotics:  Vancomycin 10/30 >>  Cefepime 11.4.2015>>  Zosyn 10/30 >>11.4.2015   Levaquin 10/30>>11.4.2015  HPI/Subjective: Non verbal  Objective: Filed Vitals:   05/20/14 0400 05/20/14 0500 05/20/14 0723 05/20/14 0750  BP:      Pulse:      Temp: 99.4 F (37.4 C)   98.3 F (36.8 C)  TempSrc: Axillary   Axillary  Resp:      Height:      Weight:  90.9 kg (200 lb 6.4 oz)    SpO2:   98%     Intake/Output Summary (Last 24 hours) at 05/20/14 0821 Last data filed at 05/20/14 0750  Gross per 24 hour  Intake    970 ml  Output    100 ml  Net    870 ml   Filed Weights   05/18/14 1230 05/19/14 0500 05/20/14 0500  Weight: 95.346 kg (210 lb 3.2 oz) 92 kg (202 lb 13.2 oz) 90.9 kg (200 lb 6.4 oz)    Exam:  General: none verbal in no acute distress.  HEENT: No bruits, no goiter.  Heart: Regular rate and rhythm. Lungs: Good air movement, crackles on left. Abdomen: Soft, nontender, nondistended, positive bowel sounds.  Neuro: Grossly intact, nonfocal.   Data Reviewed: Basic Metabolic Panel:  Recent Labs Lab 05/15/14 0151 05/16/14 0549 05/17/14 0652 05/18/14 0638 05/20/14 0451  NA 136* 134* 143 144 144  K 3.8 4.2 3.9 4.1 3.8  CL 97 90* 103  103 103  CO2 26 24 28 26 28   GLUCOSE 241* 235* 135* 166* 315*  BUN 29* 39* 26* 35* 43*  CREATININE 1.89* 2.66* 2.44* 3.27* 3.25*  CALCIUM 8.2* 7.9* 8.2* 8.5 8.4  PHOS  --  4.4 3.7 4.5  --    Liver Function Tests:  Recent Labs Lab 05/14/14 2055  AST 26  ALT 13  ALKPHOS 124*  BILITOT 0.4  PROT 6.8  ALBUMIN 2.1*   No results for input(s): LIPASE, AMYLASE in the last 168 hours. No results for input(s): AMMONIA in the last 168  hours. CBC:  Recent Labs Lab 05/14/14 2055 05/15/14 0151 05/16/14 0549 05/17/14 0652 05/18/14 0638 05/20/14 0451  WBC 11.3* 12.3* 9.9 9.5 10.1 11.6*  NEUTROABS 9.6* 8.3*  --   --   --   --   HGB 10.4* 10.3* 9.6* 9.4* 10.1* 10.1*  HCT 36.1 35.6* 33.6* 33.7* 35.5* 35.9*  MCV 95.8 95.2 96.6 98.8 98.6 99.4  PLT 283 280 281 279 272 251   Cardiac Enzymes:  Recent Labs Lab 05/14/14 2055 05/15/14 0151 05/15/14 0749 05/15/14 1338  TROPONINI <0.30 <0.30 <0.30 <0.30   BNP (last 3 results)  Recent Labs  03/16/14 0525 03/23/14 0500 05/14/14 2028  PROBNP 29632.0* 27500.0* 31667.0*   CBG:  Recent Labs Lab 05/19/14 1630 05/19/14 1951 05/20/14 0018 05/20/14 0436 05/20/14 0748  GLUCAP 284* 279* 287* 268* 293*    Recent Results (from the past 240 hour(s))  Culture, blood (routine x 2) Call MD if unable to obtain prior to antibiotics being given     Status: None (Preliminary result)   Collection Time: 05/15/14  1:21 AM  Result Value Ref Range Status   Specimen Description BLOOD LEFT ANTECUBITAL  Final   Special Requests BOTTLES DRAWN AEROBIC AND ANAEROBIC 6CC  Final   Culture NO GROWTH 4 DAYS  Final   Report Status PENDING  Incomplete  Culture, blood (routine x 2) Call MD if unable to obtain prior to antibiotics being given     Status: None (Preliminary result)   Collection Time: 05/15/14  1:21 AM  Result Value Ref Range Status   Specimen Description BLOOD LEFT ANTECUBITAL  Final   Special Requests BOTTLES DRAWN AEROBIC AND ANAEROBIC 9CC EACH  Final   Culture NO GROWTH 4 DAYS  Final   Report Status PENDING  Incomplete  MRSA PCR Screening     Status: Abnormal   Collection Time: 05/15/14  1:40 AM  Result Value Ref Range Status   MRSA by PCR POSITIVE (A) NEGATIVE Final    Comment:        The GeneXpert MRSA Assay (FDA approved for NASAL specimens only), is one component of a comprehensive MRSA colonization surveillance program. It is not intended to diagnose  MRSA infection nor to guide or monitor treatment for MRSA infections. RESULT CALLED TO, READ BACK BY AND VERIFIED WITH: NEILSON T AT 0435 ON [id number] BY FORSYTH K  Clostridium Difficile by PCR     Status: None   Collection Time: 05/15/14  3:19 PM  Result Value Ref Range Status   C difficile by pcr NEGATIVE NEGATIVE Final     Studies: Dg Chest Port 1 View  05/19/2014   CLINICAL DATA:  Status post central line placement.  EXAM: PORTABLE CHEST - 1 VIEW  COMPARISON:  May 18, 2014.  FINDINGS: Stable cardiomegaly. Status post cardiac valve repair. Right internal jugular dialysis catheter is unchanged in position. Stable central pulmonary vascular congestion is noted. Stable mild  left pleural effusion is noted. Interval placement of left internal jugular catheter line with distal tip in the expected position of the SVC. No pneumothorax is noted.  IMPRESSION: Interval placement of left internal jugular catheter line with distal tip in expected position of the SVC. No pneumothorax is noted. Stable central pulmonary vascular congestion is noted with associated left pleural effusion.   Electronically Signed   By: Roque Lias M.D.   On: 05/19/2014 14:37    Scheduled Meds: . antiseptic oral rinse  7 mL Mouth Rinse BID  . collagenase   Topical Daily  . epoetin alfa  10,000 Units Intravenous Once per day on Mon Wed Fri  . feeding supplement (VITAL HIGH PROTEIN)  1,000 mL Per Tube Q24H  . furosemide  120 mg Intravenous BID  . insulin aspart  0-9 Units Subcutaneous 6 times per day  . levalbuterol  0.63 mg Nebulization Q6H  . piperacillin-tazobactam (ZOSYN)  IV  2.25 g Intravenous 3 times per day  . sodium chloride  3 mL Intravenous Q12H  . vancomycin  1,000 mg Intravenous Q M,W,F-HD  . Warfarin - Pharmacist Dosing Inpatient   Does not apply Q24H   Continuous Infusions:    Marinda Elk  [facility] Pager [phone]. If 8PM-8AM, please contact night-coverage at [email or web address], password  [facility] 05/20/2014, 8:21 AM  LOS: 6 days

## 2014-05-20 NOTE — Progress Notes (Signed)
[name]  MRN: [id number]  DOB/AGE: 75-23-1942 75 y.o.  Primary Care Physician:Stone, Harriett Sine, NP  Admit date: 05/14/2014  Chief Complaint:  Chief Complaint  Patient presents with  . Respiratory Distress    S-Pt presented on  05/14/2014 with  Chief Complaint  Patient presents with  . Respiratory Distress  .    Pt offers no complaints.    Pt seen on HD     Meds . antiseptic oral rinse  7 mL Mouth Rinse BID  . ceFEPime (MAXIPIME) IV  1 g Intravenous Q24H  . collagenase   Topical Daily  . epoetin alfa  10,000 Units Intravenous Once per day on Mon Wed Fri  . feeding supplement (VITAL HIGH PROTEIN)  1,000 mL Per Tube Q24H  . furosemide  120 mg Intravenous BID  . insulin aspart  0-9 Units Subcutaneous 6 times per day  . levalbuterol  0.63 mg Nebulization Q6H  . sodium chloride  3 mL Intravenous Q12H  . vancomycin  1,000 mg Intravenous Q M,W,F-HD  . Warfarin - Pharmacist Dosing Inpatient   Does not apply Q24H          Physical Exam: Vital signs in last 24 hours: Temp:  [98 F (36.7 C)-99.4 F (37.4 C)] 98.3 F (36.8 C) (11/04 0750) Pulse Rate:  [21-86] 78 (11/04 0900) Resp:  [28-38] 38 (11/04 0900) BP: (96-124)/(50-87) 97/52 mmHg (11/04 0900) SpO2:  [88 %-99 %] 93 % (11/04 0900) FiO2 (%):  [35 %] 35 % (11/04 0723) Weight:  [200 lb 6.4 oz (90.9 kg)] 200 lb 6.4 oz (90.9 kg) (11/04 0500) Weight change: -9 lb 12.8 oz (-4.446 kg) Last BM Date: 05/19/14  Intake/Output from previous day: 11/03 0701 - 11/04 0700 In: 970 [NG/GT:720; IV Piggyback:250] Out: -  Total I/O In: 126.7 [NG/GT:76.7; IV Piggyback:50] Out: 100 [Urine:100]   Physical Exam: General- pt is Lethargic, . Resp- No acute REsp distress, CTA B/L NO Rhonchi CVS- S1S2 regular in rate and rhythm,SEM + GIT- BS+, soft, NT, ND EXT- NO LE Edema,NO Cyanosis Acces Right PC tunneled site erythematous + Drainage +   Lab Results: CBC  Recent Labs  05/18/14 0638 05/20/14 0451  WBC 10.1 11.6*  HGB  10.1* 10.1*  HCT 35.5* 35.9*  PLT 272 251    BMET  Recent Labs  05/18/14 0638 05/20/14 0451  NA 144 144  K 4.1 3.8  CL 103 103  CO2 26 28  GLUCOSE 166* 315*  BUN 35* 43*  CREATININE 3.27* 3.25*  CALCIUM 8.5 8.4    MICRO Recent Results (from the past 240 hour(s))  Culture, blood (routine x 2) Call MD if unable to obtain prior to antibiotics being given     Status: None (Preliminary result)   Collection Time: 05/15/14  1:21 AM  Result Value Ref Range Status   Specimen Description BLOOD LEFT ANTECUBITAL  Final   Special Requests BOTTLES DRAWN AEROBIC AND ANAEROBIC 6CC  Final   Culture NO GROWTH 4 DAYS  Final   Report Status PENDING  Incomplete  Culture, blood (routine x 2) Call MD if unable to obtain prior to antibiotics being given     Status: None (Preliminary result)   Collection Time: 05/15/14  1:21 AM  Result Value Ref Range Status   Specimen Description BLOOD LEFT ANTECUBITAL  Final   Special Requests BOTTLES DRAWN AEROBIC AND ANAEROBIC 9CC EACH  Final   Culture NO GROWTH 4 DAYS  Final   Report Status PENDING  Incomplete  MRSA  PCR Screening     Status: Abnormal   Collection Time: 05/15/14  1:40 AM  Result Value Ref Range Status   MRSA by PCR POSITIVE (A) NEGATIVE Final    Comment:        The GeneXpert MRSA Assay (FDA approved for NASAL specimens only), is one component of a comprehensive MRSA colonization surveillance program. It is not intended to diagnose MRSA infection nor to guide or monitor treatment for MRSA infections. RESULT CALLED TO, READ BACK BY AND VERIFIED WITH: NEILSON T AT 0435 ON [id number] BY FORSYTH K  Clostridium Difficile by PCR     Status: None   Collection Time: 05/15/14  3:19 PM  Result Value Ref Range Status   C difficile by pcr NEGATIVE NEGATIVE Final      Lab Results  Component Value Date   PTH 178* 03/16/2014   CALCIUM 8.4 05/20/2014   CAION 1.19 02/28/2014   PHOS 4.5 05/18/2014        Impression: 1)Renal  ESRD on HD    Pt to be dialyzed today  2)HTN  BP stable Medication- On Diuretics    3)Anemia HGb at goal (9--11)   4)CKD Mineral-Bone Disorder PTH acceptable. Secondary Hyperparathyroidism  present . Phosphorus at goal.   5)ID-admitted with HCAP Tunneled site infected On IV ABX Will continue current regimen  6)Electrolytes Normokalemic NOrmonatremic   7)Acid base Co2 at goal     Plan:  Will continue current care. Blood culture shows no growth.  If site does not clear pt may need new PC.      Jarman Litton S 05/20/2014, 9:56 AM

## 2014-05-20 NOTE — Clinical Social Work Note (Signed)
CSW spoke with patient's grandson, Jaelianna Sanon. CSW discussed Mr. Jiminian thoughts on patient returning to [facility] upon discharge.  Mr.Leder indicated that he was currently on the phone with DSS Social Worker Barnie Alderman discussing placement options as he did not want patient to return to [facility].  He indicated that Ms. Price was assisting him the this.  CSW advised that she could assist with locating placement additionally. CSW requested that Mr. Guardian allow her to send patient's clinicals to additional facilities.  Mr. Fritcher indicated that he had Ms. Price on the other line working on this and that he would contact CSW back upon completion of the call with Ms. Price.     Tretha Sciara, [place] [phone]

## 2014-05-20 NOTE — Progress Notes (Signed)
ANTIBIOTIC CONSULT NOTE   Pharmacy Consult for Vancomycin and Cefepime Indication: pneumonia  No Known Allergies  Patient Measurements: Height: 5\' 10"  (177.8 cm) Weight: 201 lb 9.6 oz (91.445 kg) IBW/kg (Calculated) : 68.5  Vital Signs: Temp: 98.3 F (36.8 C) (11/04 0750) Temp Source: Axillary (11/04 1000) BP: 93/56 mmHg (11/04 1100) Pulse Rate: 82 (11/04 1100) Intake/Output from previous day: 11/03 0701 - 11/04 0700 In: 970 [NG/GT:720; IV Piggyback:250] Out: -  Intake/Output from this shift: Total I/O In: 126.7 [NG/GT:76.7; IV Piggyback:50] Out: 100 [Urine:100]  Labs:  Recent Labs  05/18/14 0638 05/20/14 0451  WBC 10.1 11.6*  HGB 10.1* 10.1*  PLT 272 251  CREATININE 3.27* 3.25*   Estimated Creatinine Clearance: 18.3 mL/min (by C-G formula based on Cr of 3.25). No results for input(s): VANCOTROUGH, VANCOPEAK, VANCORANDOM, GENTTROUGH, GENTPEAK, GENTRANDOM, TOBRATROUGH, TOBRAPEAK, TOBRARND, AMIKACINPEAK, AMIKACINTROU, AMIKACIN in the last 72 hours.   Microbiology: Recent Results (from the past 720 hour(s))  Culture, blood (routine x 2) Call MD if unable to obtain prior to antibiotics being given     Status: None   Collection Time: 05/15/14  1:21 AM  Result Value Ref Range Status   Specimen Description BLOOD LEFT ANTECUBITAL  Final   Special Requests BOTTLES DRAWN AEROBIC AND ANAEROBIC 6CC  Final   Culture NO GROWTH 5 DAYS  Final   Report Status 05/20/2014 FINAL  Final  Culture, blood (routine x 2) Call MD if unable to obtain prior to antibiotics being given     Status: None   Collection Time: 05/15/14  1:21 AM  Result Value Ref Range Status   Specimen Description BLOOD LEFT ANTECUBITAL  Final   Special Requests BOTTLES DRAWN AEROBIC AND ANAEROBIC 9CC EACH  Final   Culture NO GROWTH 5 DAYS  Final   Report Status 05/20/2014 FINAL  Final  MRSA PCR Screening     Status: Abnormal   Collection Time: 05/15/14  1:40 AM  Result Value Ref Range Status   MRSA by PCR  POSITIVE (A) NEGATIVE Final    Comment:        The GeneXpert MRSA Assay (FDA approved for NASAL specimens only), is one component of a comprehensive MRSA colonization surveillance program. It is not intended to diagnose MRSA infection nor to guide or monitor treatment for MRSA infections. RESULT CALLED TO, READ BACK BY AND VERIFIED WITH: NEILSON T AT 0435 ON [id number] BY FORSYTH K  Clostridium Difficile by PCR     Status: None   Collection Time: 05/15/14  3:19 PM  Result Value Ref Range Status   C difficile by pcr NEGATIVE NEGATIVE Final    Anti-infectives    Start     Dose/Rate Route Frequency Ordered Stop   05/20/14 1200  ceFEPIme (MAXIPIME) 2 g in dextrose 5 % 50 mL IVPB     2 g100 mL/hr over 30 Minutes Intravenous Every M-W-F (Hemodialysis) 05/20/14 1115     05/20/14 1000  ceFEPIme (MAXIPIME) 1 g in dextrose 5 % 50 mL IVPB  Status:  Discontinued     1 g100 mL/hr over 30 Minutes Intravenous Every 24 hours 05/20/14 0825 05/20/14 1115   05/18/14 1400  vancomycin (VANCOCIN) IVPB 1000 mg/200 mL premix     1,000 mg200 mL/hr over 60 Minutes Intravenous Every M-W-F (Hemodialysis) 05/17/14 0852     05/17/14 0300  levofloxacin (LEVAQUIN) IVPB 750 mg  Status:  Discontinued     750 mg100 mL/hr over 90 Minutes Intravenous Every 48 hours 05/15/14 0746 05/15/14 1034  05/17/14 0300  levofloxacin (LEVAQUIN) IVPB 500 mg  Status:  Discontinued     500 mg100 mL/hr over 60 Minutes Intravenous Every 48 hours 05/15/14 1034 05/15/14 1454   05/16/14 1200  vancomycin (VANCOCIN) IVPB 1000 mg/200 mL premix  Status:  Discontinued     1,000 mg200 mL/hr over 60 Minutes Intravenous Every T-Th-Sa (Hemodialysis) 05/15/14 1034 05/17/14 0852   05/15/14 2200  vancomycin (VANCOCIN) IVPB 1000 mg/200 mL premix  Status:  Discontinued     1,000 mg200 mL/hr over 60 Minutes Intravenous Every 24 hours 05/15/14 0746 05/15/14 1034   05/15/14 1400  piperacillin-tazobactam (ZOSYN) IVPB 3.375 g  Status:  Discontinued     3.375  g12.5 mL/hr over 240 Minutes Intravenous 3 times per day 05/15/14 0746 05/15/14 1034   05/15/14 1400  piperacillin-tazobactam (ZOSYN) IVPB 2.25 g  Status:  Discontinued     2.25 g100 mL/hr over 30 Minutes Intravenous 3 times per day 05/15/14 1034 05/20/14 0825   05/15/14 0700  piperacillin-tazobactam (ZOSYN) IVPB 3.375 g  Status:  Discontinued     3.375 g12.5 mL/hr over 240 Minutes Intravenous  Once 05/15/14 0144 05/15/14 0144   05/15/14 0700  piperacillin-tazobactam (ZOSYN) IVPB 2.25 g     2.25 g100 mL/hr over 30 Minutes Intravenous  Once 05/15/14 0145 05/15/14 0739   05/15/14 0300  levofloxacin (LEVAQUIN) IVPB 750 mg     750 mg100 mL/hr over 90 Minutes Intravenous  Once 05/15/14 0204 05/15/14 0447   05/14/14 2200  piperacillin-tazobactam (ZOSYN) IVPB 3.375 g     3.375 g100 mL/hr over 30 Minutes Intravenous  Once 05/14/14 2155 05/14/14 2311   05/14/14 2200  vancomycin (VANCOCIN) IVPB 1000 mg/200 mL premix     1,000 mg200 mL/hr over 60 Minutes Intravenous  Once 05/14/14 2155 05/15/14 0016     Assessment: 73 yoF admitted from NH with worsening shortness of breath and altered mental status.  She has ESRD on HD T/T/S.  She was started on oral Levaquin prior to admission 10/29, now on Vancomycin and Cefepime IV.  CXR + LLL PNA.   She is afebrile.  Goal of Therapy:  Pre-HD Vancomycin level 15-25 mcg/ml  Plan:  Cefepime 2gm IV every HD Vancomycin 1gm IV qHD  Check pre-HD level at steady state Monitor patient progress & cx data  Mady Gemma 05/20/2014,11:16 AM

## 2014-05-21 LAB — CBC
HCT: 36.3 % (ref 36.0–46.0)
HEMOGLOBIN: 10.1 g/dL — AB (ref 12.0–15.0)
MCH: 27.4 pg (ref 26.0–34.0)
MCHC: 27.8 g/dL — ABNORMAL LOW (ref 30.0–36.0)
MCV: 98.6 fL (ref 78.0–100.0)
PLATELETS: 254 10*3/uL (ref 150–400)
RBC: 3.68 MIL/uL — AB (ref 3.87–5.11)
RDW: 27.9 % — ABNORMAL HIGH (ref 11.5–15.5)
WBC: 12.2 10*3/uL — AB (ref 4.0–10.5)

## 2014-05-21 LAB — GLUCOSE, CAPILLARY
GLUCOSE-CAPILLARY: 221 mg/dL — AB (ref 70–99)
Glucose-Capillary: 199 mg/dL — ABNORMAL HIGH (ref 70–99)
Glucose-Capillary: 205 mg/dL — ABNORMAL HIGH (ref 70–99)
Glucose-Capillary: 233 mg/dL — ABNORMAL HIGH (ref 70–99)
Glucose-Capillary: 236 mg/dL — ABNORMAL HIGH (ref 70–99)
Glucose-Capillary: 239 mg/dL — ABNORMAL HIGH (ref 70–99)
Glucose-Capillary: 248 mg/dL — ABNORMAL HIGH (ref 70–99)

## 2014-05-21 LAB — PROTIME-INR
INR: 2.24 — ABNORMAL HIGH (ref 0.00–1.49)
Prothrombin Time: 25 seconds — ABNORMAL HIGH (ref 11.6–15.2)

## 2014-05-21 MED ORDER — WARFARIN SODIUM 2 MG PO TABS
2.0000 mg | ORAL_TABLET | Freq: Once | ORAL | Status: AC
  Administered 2014-05-21: 2 mg via ORAL
  Filled 2014-05-21: qty 1

## 2014-05-21 MED ORDER — INSULIN GLARGINE 100 UNIT/ML ~~LOC~~ SOLN
30.0000 [IU] | Freq: Two times a day (BID) | SUBCUTANEOUS | Status: DC
Start: 2014-05-21 — End: 2014-05-23
  Administered 2014-05-21 – 2014-05-22 (×3): 30 [IU] via SUBCUTANEOUS
  Filled 2014-05-21 (×9): qty 0.3

## 2014-05-21 MED ORDER — FUROSEMIDE 10 MG/ML IJ SOLN
120.0000 mg | Freq: Two times a day (BID) | INTRAMUSCULAR | Status: DC
  Administered 2014-05-21 – 2014-05-22 (×3): 120 mg via INTRAVENOUS
  Filled 2014-05-21 (×3): qty 12

## 2014-05-21 NOTE — Progress Notes (Signed)
[facility] PROGRESS NOTE Interim History: 73 year old with recent hospitalized from 01/25/2014 08/25/2019 2015 with a STEMI leading to emergent CABG and mitral valve replacement postop course complicated and prolonged with balloon pump placement, CVVHD leading to HD, transfer to LTAC then to a skilled nursing facility where he developed diarrhea and pneumonia treated with Levaquin. As per staff reports his condition declined as he was not eating and remained confused admitted on 1029 with lethargy and hypoxia and diagnosed with H Treated with BiPAP and septic shock he did not require intubation.   Assessment/Plan: Acute respiratory failure with hypoxia secondary to HCAP: - De-escalate to nasal cannula. Sat are stable/ - Continue bronchodilators. - Antibiotic vancomycin, Zosyn and Levaquin started on 05/15/2014 we'll de-escalate to cefepime. - White count continues to fluctuate, spiked a temperature overnight. BC and sputum negative - prognosis is guarded. I will d/w family long term prognosis and GOC.   Septic shock: - resolved. - Due to HCAP. - Continue IV antibiotics continue to follow cultures. - Re-check c.dif PCR.  Acute encephalopathy: - On superimposed dementia, continues to be confused.  Bilateral pleural effusion: - not large enough for thoracocentesis, her oxygenation remained stable to slightly improved. - RIGHT THORACENTESIS - 02/06/2014  Chronic systolic CHF LVEF 40-45% 02/2014: - Appears euvolemic. - Volume control per nephrology with HD. - Also on high-dose Lasix.  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 - Goal INR to be between 2.5 and 3.5 for prostatic valve.  Multiple wounds bilateral LE, s/p PEG;  - Wound care recs noted. per Dr. Lovell Sheehan, he recommends chemical debridement at this point.  - No Surgical intervention not recommended at this  point.  Diarrhea: -  cdiff negative likely secondary to TF, abx.  DM type 2: - increase long acting insulin. - cont ISS:   Atrial fibrillation on warfarin.  - Continue Coumadin.  Code Status: DNR Family Communication: none  Disposition Plan: inpatient   Consultants:  nephrology  Procedures:  Diagnosis right subclavian catheter  Antibiotics:  Vancomycin 10/30 >>  Cefepime 11.4.2015>>  Zosyn 10/30 >>11.4.2015   Levaquin 10/30>>11.4.2015  HPI/Subjective: Unresponsive.  Objective: Filed Vitals:   05/21/14 0345 05/21/14 0400 05/21/14 0500 05/21/14 0712  BP:  104/45 97/51   Pulse:  85 87   Temp:  99.8 F (37.7 C)    TempSrc:  Axillary    Resp:  30 37   Height:      Weight:   87.6 kg (193 lb 2 oz)   SpO2: 98% 95% 93% 97%    Intake/Output Summary (Last 24 hours) at 05/21/14 0754 Last data filed at 05/21/14 0500  Gross per 24 hour  Intake 238.67 ml  Output   3110 ml  Net -2871.33 ml   Filed Weights   05/20/14 0500 05/20/14 1000 05/21/14 0500  Weight: 90.9 kg (200 lb 6.4 oz) 91.445 kg (201 lb 9.6 oz) 87.6 kg (193 lb 2 oz)    Exam:  General: none verbal in no acute distress.  HEENT: No bruits, no goiter.  Heart: Regular rate and rhythm. Lungs: Good air movement, crackles on left. Abdomen: Soft, nontender, nondistended, positive bowel sounds.    Data Reviewed: Basic Metabolic Panel:  Recent Labs Lab 05/15/14 0151 05/16/14 0549 05/17/14 0652 05/18/14 0638 05/20/14 0451  NA 136* 134* 143 144 144  K 3.8 4.2 3.9 4.1 3.8  CL 97 90* 103  103 103  CO2 26 24 28 26 28   GLUCOSE 241* 235* 135* 166* 315*  BUN 29* 39* 26* 35* 43*  CREATININE 1.89* 2.66* 2.44* 3.27* 3.25*  CALCIUM 8.2* 7.9* 8.2* 8.5 8.4  PHOS  --  4.4 3.7 4.5  --    Liver Function Tests:  Recent Labs Lab 05/14/14 2055  AST 26  ALT 13  ALKPHOS 124*  BILITOT 0.4  PROT 6.8  ALBUMIN 2.1*   No results for input(s): LIPASE, AMYLASE in the last 168 hours. No results for  input(s): AMMONIA in the last 168 hours. CBC:  Recent Labs Lab 05/14/14 2055 05/15/14 0151 05/16/14 0549 05/17/14 0652 05/18/14 0638 05/20/14 0451 05/21/14 0425  WBC 11.3* 12.3* 9.9 9.5 10.1 11.6* 12.2*  NEUTROABS 9.6* 8.3*  --   --   --   --   --   HGB 10.4* 10.3* 9.6* 9.4* 10.1* 10.1* 10.1*  HCT 36.1 35.6* 33.6* 33.7* 35.5* 35.9* 36.3  MCV 95.8 95.2 96.6 98.8 98.6 99.4 98.6  PLT 283 280 281 279 272 251 254   Cardiac Enzymes:  Recent Labs Lab 05/14/14 2055 05/15/14 0151 05/15/14 0749 05/15/14 1338  TROPONINI <0.30 <0.30 <0.30 <0.30   BNP (last 3 results)  Recent Labs  03/16/14 0525 03/23/14 0500 05/14/14 2028  PROBNP 29632.0* 27500.0* 31667.0*   CBG:  Recent Labs Lab 05/20/14 1122 05/20/14 1651 05/20/14 2006 05/21/14 0004 05/21/14 0401  GLUCAP 220* 232* 280* 221* 205*    Recent Results (from the past 240 hour(s))  Culture, blood (routine x 2) Call MD if unable to obtain prior to antibiotics being given     Status: None   Collection Time: 05/15/14  1:21 AM  Result Value Ref Range Status   Specimen Description BLOOD LEFT ANTECUBITAL  Final   Special Requests BOTTLES DRAWN AEROBIC AND ANAEROBIC 6CC  Final   Culture NO GROWTH 5 DAYS  Final   Report Status 05/20/2014 FINAL  Final  Culture, blood (routine x 2) Call MD if unable to obtain prior to antibiotics being given     Status: None   Collection Time: 05/15/14  1:21 AM  Result Value Ref Range Status   Specimen Description BLOOD LEFT ANTECUBITAL  Final   Special Requests BOTTLES DRAWN AEROBIC AND ANAEROBIC 9CC EACH  Final   Culture NO GROWTH 5 DAYS  Final   Report Status 05/20/2014 FINAL  Final  MRSA PCR Screening     Status: Abnormal   Collection Time: 05/15/14  1:40 AM  Result Value Ref Range Status   MRSA by PCR POSITIVE (A) NEGATIVE Final    Comment:        The GeneXpert MRSA Assay (FDA approved for NASAL specimens only), is one component of a comprehensive MRSA colonization surveillance  program. It is not intended to diagnose MRSA infection nor to guide or monitor treatment for MRSA infections. RESULT CALLED TO, READ BACK BY AND VERIFIED WITH: NEILSON T AT 0435 ON [id number] BY FORSYTH K  Clostridium Difficile by PCR     Status: None   Collection Time: 05/15/14  3:19 PM  Result Value Ref Range Status   C difficile by pcr NEGATIVE NEGATIVE Final     Studies: Dg Chest Port 1 View  05/19/2014   CLINICAL DATA:  Status post central line placement.  EXAM: PORTABLE CHEST - 1 VIEW  COMPARISON:  May 18, 2014.  FINDINGS: Stable cardiomegaly. Status post cardiac valve repair. Right internal jugular dialysis catheter is unchanged in position. Stable  central pulmonary vascular congestion is noted. Stable mild left pleural effusion is noted. Interval placement of left internal jugular catheter line with distal tip in the expected position of the SVC. No pneumothorax is noted.  IMPRESSION: Interval placement of left internal jugular catheter line with distal tip in expected position of the SVC. No pneumothorax is noted. Stable central pulmonary vascular congestion is noted with associated left pleural effusion.   Electronically Signed   By: Roque LiasJames  Green M.D.   On: 05/19/2014 14:37    Scheduled Meds: . antiseptic oral rinse  7 mL Mouth Rinse BID  . ceFEPime (MAXIPIME) IV  2 g Intravenous Q M,W,F-HD  . collagenase   Topical Daily  . epoetin alfa  10,000 Units Intravenous Once per day on Mon Wed Fri  . feeding supplement (VITAL HIGH PROTEIN)  1,000 mL Per Tube Q24H  . furosemide  120 mg Intravenous BID  . insulin aspart  0-9 Units Subcutaneous 6 times per day  . insulin glargine  20 Units Subcutaneous BID  . levalbuterol  0.63 mg Nebulization Q6H  . sodium chloride  3 mL Intravenous Q12H  . vancomycin  1,000 mg Intravenous Q M,W,F-HD  . Warfarin - Pharmacist Dosing Inpatient   Does not apply Q24H   Continuous Infusions:    Marinda ElkFELIZ ORTIZ, ABRAHAM  [facility] Pager [phone].  If 8PM-8AM, please contact night-coverage at [email or web address], password [email or web address] 05/21/2014, 7:54 AM  LOS: 7 days

## 2014-05-21 NOTE — Progress Notes (Signed)
Inpatient Diabetes Program Recommendations  AACE/ADA: New Consensus Statement on Inpatient Glycemic Control (2013)  Target Ranges:  Prepandial:   less than 140 mg/dL      Peak postprandial:   less than 180 mg/dL (1-2 hours)      Critically ill patients:  140 - 180 mg/dL   Consider adding Novolog 2 units Q4 for tube feed coverage. Noted Lantus increase.  Thank you  Piedad Climes BSN, RN,CDE Inpatient Diabetes Coordinator [phone] (team pager)

## 2014-05-21 NOTE — Clinical Social Work Psych Note (Signed)
CSW spoke with patient's grandson, Hensley Kaschak. Mr. Gilfillan indicate that CSW could disclose patient's diagnosis "and whatever they needed. I give full permission" to [facility]. He indicated that he would like for CSW to seek placement in [place], [place] and [facility] for patient.  Mr. Glassco gave CSW permission to send clinicals to facilities in [place], [place] and [facility].  CSW advised Mr. Gros that patient's doctor, Dr. Robb Matar, had been attempting to contact him and that he would need to return contact to Dr. Robb Matar. Mr. Reichard agreed and indicated that he had the number.    CSW faxed clinicals to [facility], [facility], [facility], [facility] and [facility].    Tretha Sciara, LCSW [phone]

## 2014-05-21 NOTE — Progress Notes (Signed)
Subjective: Interval History: None  Objective: Vital signs in last 24 hours: Temp:  [97.9 F (36.6 C)-100.4 F (38 C)] 97.9 F (36.6 C) (11/05 0822) Pulse Rate:  [78-91] 87 (11/05 0500) Resp:  [19-39] 37 (11/05 0500) BP: (74-105)/(44-63) 97/51 mmHg (11/05 0500) SpO2:  [93 %-100 %] 97 % (11/05 0712) Weight:  [87.6 kg (193 lb 2 oz)-91.445 kg (201 lb 9.6 oz)] 87.6 kg (193 lb 2 oz) (11/05 0500) Weight change: 0.545 kg (1 lb 3.2 oz)  Intake/Output from previous day: 11/04 0701 - 11/05 0700 In: 238.7 [NG/GT:76.7; IV Piggyback:162] Out: 3210 [Urine:150; Stool:1000] Intake/Output this shift:    Generally Patient is more alert today but still does not communicate Chest : She has expiratory rhonchi anteriorely and bilaterally Heart RRR SEM+ Abdomen: None tender No edema  Lab Results:  Recent Labs  05/20/14 0451 05/21/14 0425  WBC 11.6* 12.2*  HGB 10.1* 10.1*  HCT 35.9* 36.3  PLT 251 254   BMET:   Recent Labs  05/20/14 0451  NA 144  K 3.8  CL 103  CO2 28  GLUCOSE 315*  BUN 43*  CREATININE 3.25*  CALCIUM 8.4   No results for input(s): PTH in the last 72 hours. Iron Studies: No results for input(s): IRON, TIBC, TRANSFERRIN, FERRITIN in the last 72 hours.  Studies/Results: Dg Chest Port 1 View  05/19/2014   CLINICAL DATA:  Status post central line placement.  EXAM: PORTABLE CHEST - 1 VIEW  COMPARISON:  May 18, 2014.  FINDINGS: Stable cardiomegaly. Status post cardiac valve repair. Right internal jugular dialysis catheter is unchanged in position. Stable central pulmonary vascular congestion is noted. Stable mild left pleural effusion is noted. Interval placement of left internal jugular catheter line with distal tip in the expected position of the SVC. No pneumothorax is noted.  IMPRESSION: Interval placement of left internal jugular catheter line with distal tip in expected position of the SVC. No pneumothorax is noted. Stable central pulmonary vascular congestion is  noted with associated left pleural effusion.   Electronically Signed   By: Roque Lias M.D.   On: 05/19/2014 14:37    I have reviewed the patient's current medications.  Assessment/Plan: AKI: Patient s/p dialysis yesterday. Her potassium is normal Problem#2 AMS; Fluctuating . Today she is awake but does not answer qeustions Problem#3 HCAP: Patient on antibiotics. Patient with intermmitent low grade fever and her white blood cell count is high Problem#4 Anemia: Her hemoglobin is with in our target goal and stable  Problem#5 Hypotension :  Problem#6 Metabolic bone disease : Her calcium  is in range.  Problem#7 CAD s/p CABG Patient does not require dialysis today We will make arrangement for dialysis tomorrow Basic metabolic panel/phosphorus /cbc in am   LOS: 7 days   Ahja Martello S 05/21/2014,8:31 AM

## 2014-05-21 NOTE — Progress Notes (Signed)
ANTICOAGULATION CONSULT NOTE -  Pharmacy Consult for Coumadin (chronic Rx PTA) Indication: atrial fibrillation / MVR  No Known Allergies  Patient Measurements: Height: 5\' 10"  (177.8 cm) Weight: 193 lb 2 oz (87.6 kg) IBW/kg (Calculated) : 68.5  Vital Signs: Temp: 97.9 F (36.6 C) (11/05 0822) Temp Source: Axillary (11/05 0822) BP: 97/51 mmHg (11/05 0500) Pulse Rate: 87 (11/05 0500)  Labs:  Recent Labs  05/19/14 0831 05/20/14 0451 05/20/14 1419 05/21/14 0425  HGB  --  10.1*  --  10.1*  HCT  --  35.9*  --  36.3  PLT  --  251  --  254  LABPROT 20.4*  --  17.2* 25.0*  INR 1.73*  --  1.39 2.24*  CREATININE  --  3.25*  --   --    Estimated Creatinine Clearance: 18 mL/min (by C-G formula based on Cr of 3.25).  Medical History: Past Medical History  Diagnosis Date  . History of MI (myocardial infarction)     [facility] (cannot get cath report on Care Everywhere(  . Essential hypertension   . TIA (transient ischemic attack)   . Thyroid disease   . Anxiety   . Diabetes mellitus type 2 with peripheral artery disease     On insulin  . PAD (peripheral artery disease)   . Hyperlipidemia associated with type 2 diabetes mellitus   . A-fib   . ARF (acute renal failure)    Medications:  Prescriptions prior to admission  Medication Sig Dispense Refill Last Dose  . Amino Acids-Protein Hydrolys (FEEDING SUPPLEMENT, PRO-STAT SUGAR FREE 64,) LIQD Give 30 mLs by tube 3 (three) times daily.    05/14/2014 at Unknown time  . amiodarone (PACERONE) 200 MG tablet Give 200 mg by tube daily.   05/14/2014 at Unknown time  . aspirin EC 81 MG tablet Give 81 mg by tube daily.   05/14/2014 at Unknown time  . famotidine (PEPCID) 20 MG tablet Give 20 mg by tube 2 (two) times daily.   05/14/2014 at Unknown time  . fluconazole (DIFLUCAN) 150 MG tablet Take 150 mg by mouth daily.   05/14/2014 at Unknown time  . furosemide (LASIX) 20 MG tablet Give 20 mg by tube See admin instructions.  Give 20mg  on Mon.,Wed.,Fri.,   05/13/2014 at Unknown time  . guaifenesin (ROBITUSSIN) 100 MG/5ML syrup Give 200 mg by tube 4 (four) times daily as needed for cough.    05/14/2014 at Unknown time  . Guar Gum (NUTRISOURCE FIBER) PACK Give 1 Package by tube daily.   unknown at Unknown time  . insulin aspart (NOVOLOG) 100 UNIT/ML injection Inject 0-12 Units into the skin See admin instructions. Sliding scale BG below 70= 0 units; 70 - 150 give 0 units; 151- 200 give 2 units; 201- 250 give 4 units; 251- 300 give 6 units; 301- 350 give 8 units; 351 -400 give 10 units; for BG above 400 give 12 units and call doctor   05/14/2014 at Unknown time  . insulin detemir (LEVEMIR) 100 UNIT/ML injection Inject 10 Units into the skin 2 (two) times daily.    05/14/2014 at Unknown time  . levofloxacin (LEVAQUIN) 500 MG tablet Place 500 mg into feeding tube daily. For 6 days (starting 05/14/2014)   05/14/2014 at Unknown time  . levothyroxine (SYNTHROID, LEVOTHROID) 88 MCG tablet Give 88 mcg by tube daily before breakfast.   05/14/2014 at Unknown time  . LORazepam (ATIVAN) 0.5 MG tablet Take 0.5 mg by mouth every 6 (  six) hours as needed for anxiety.   05/12/2014  . metoCLOPramide (REGLAN) 5 MG tablet Give 5 mg by tube every 6 (six) hours.   05/14/2014 at Unknown time  . midodrine (PROAMATINE) 5 MG tablet Give 5 mg by tube 3 (three) times daily with meals.   05/14/2014 at Unknown time  . mirtazapine (REMERON) 15 MG tablet Give 15 mg by tube at bedtime.   05/13/2014 at Unknown time  . potassium chloride 20 MEQ/15ML (10%) solution Give 20 mEq by tube 2 (two) times daily.    05/14/2014 at Unknown time  . sertraline (ZOLOFT) 50 MG tablet Give 50 mg by tube daily.   05/14/2014 at Unknown time  . sevelamer carbonate (RENVELA) 2.4 G PACK Give 2.4 g by tube daily.   05/14/2014 at Unknown time  . vitamin D, CHOLECALCIFEROL, 400 UNITS tablet Place 400 Units into feeding tube 2 (two) times daily.   05/14/2014 at Unknown time  .  warfarin (COUMADIN) 2 MG tablet Take 2 mg by mouth daily.   05/14/2014 at 1700  . acetaminophen (TYLENOL) 325 MG tablet Give 650 mg by tube every 6 (six) hours as needed for moderate pain or fever.   unknown  . acetaminophen (TYLENOL) 650 MG suppository Place 650 mg rectally every 6 (six) hours as needed for mild pain.   unknown  . dextrose (GLUTOSE) 40 % GEL Give 1 Tube by tube as needed for low blood sugar.    unknown  . dextrose 50 % solution Inject 11-25 mLs into the vein as needed for low blood sugar. Sliding scale: for low BG 21ml < 22; 33ml = 22-28; 40ml = 29-35; 44ml = 36-41; 40ml = 42-48; 44ml = 49- 55; 13ml= 56 - 61; 22ml= 62- 70; call md >70   unknown  . glucagon, human recombinant, (GLUCAGEN DIAGNOSTIC) 1 MG injection Inject 1 mg into the vein once as needed for low blood sugar.   unknown  . nitroGLYCERIN (NITROSTAT) 0.4 MG SL tablet Place 0.4 mg under the tongue every 5 (five) minutes as needed for chest pain.   unknown   Assessment: 73yo female on chronic Coumadin PTA for h/o afib/MVR.  INR was >2.0 on admission.  Pt was in ICU with diarrhea and not eating therefore has not received any Coumadin during this admission prior to 05/19/2014.  INR subtherapeutic today but trending up.   Goal of Therapy:  INR 2.5 - 3.5   Plan:  Coumadin 2 mg po today x 1 INR daily  Mady Gemma 05/21/2014,9:13 AM

## 2014-05-22 LAB — BASIC METABOLIC PANEL
Anion gap: 15 (ref 5–15)
BUN: 52 mg/dL — ABNORMAL HIGH (ref 6–23)
CALCIUM: 8.7 mg/dL (ref 8.4–10.5)
CO2: 29 meq/L (ref 19–32)
Chloride: 98 mEq/L (ref 96–112)
Creatinine, Ser: 3.16 mg/dL — ABNORMAL HIGH (ref 0.50–1.10)
GFR calc non Af Amer: 13 mL/min — ABNORMAL LOW (ref >90)
GFR, EST AFRICAN AMERICAN: 15 mL/min — AB (ref >90)
Glucose, Bld: 238 mg/dL — ABNORMAL HIGH (ref 70–99)
Potassium: 3.9 mEq/L (ref 3.7–5.3)
SODIUM: 142 meq/L (ref 137–147)

## 2014-05-22 LAB — GLUCOSE, CAPILLARY
GLUCOSE-CAPILLARY: 284 mg/dL — AB (ref 70–99)
GLUCOSE-CAPILLARY: 205 mg/dL — AB (ref 70–99)
Glucose-Capillary: 232 mg/dL — ABNORMAL HIGH (ref 70–99)
Glucose-Capillary: 167 mg/dL — ABNORMAL HIGH (ref 70–99)
Glucose-Capillary: 75 mg/dL (ref 70–99)

## 2014-05-22 LAB — VANCOMYCIN, RANDOM: VANCOMYCIN RM: 15 ug/mL

## 2014-05-22 LAB — PROTIME-INR
INR: 1.4 (ref 0.00–1.49)
PROTHROMBIN TIME: 17.3 s — AB (ref 11.6–15.2)

## 2014-05-22 LAB — PHOSPHORUS: PHOSPHORUS: 4.8 mg/dL — AB (ref 2.3–4.6)

## 2014-05-22 MED ORDER — WARFARIN SODIUM 2 MG PO TABS
2.0000 mg | ORAL_TABLET | Freq: Once | ORAL | Status: AC
  Administered 2014-05-22: 2 mg via ORAL
  Filled 2014-05-22: qty 1

## 2014-05-22 MED ORDER — ALTEPLASE 2 MG IJ SOLR
2.0000 mg | Freq: Once | INTRAMUSCULAR | Status: AC | PRN
Start: 2014-05-22 — End: 2014-05-22

## 2014-05-22 MED ORDER — HEPARIN SODIUM (PORCINE) 1000 UNIT/ML IJ SOLN
INTRAMUSCULAR | Status: AC
  Administered 2014-05-22: 4000 [IU] via INTRAVENOUS
  Filled 2014-05-22: qty 4

## 2014-05-22 MED ORDER — SODIUM CHLORIDE 0.9 % IV SOLN: 100.0000 mL | INTRAVENOUS | Status: DC | PRN

## 2014-05-22 MED ORDER — SODIUM CHLORIDE 0.9 % IV SOLN
100.0000 mL | INTRAVENOUS | Status: DC | PRN
Start: 2014-05-22 — End: 2014-05-23

## 2014-05-22 MED ORDER — HEPARIN SODIUM (PORCINE) 1000 UNIT/ML IJ SOLN
4000.0000 [IU] | Freq: Once | INTRAMUSCULAR | Status: AC
  Administered 2014-05-22: 4000 [IU] via INTRAVENOUS

## 2014-05-22 NOTE — Progress Notes (Signed)
ANTIBIOTIC CONSULT NOTE   Pharmacy Consult for Vancomycin and Cefepime Indication: pneumonia  No Known Allergies  Patient Measurements: Height: 5\' 10"  (177.8 cm) Weight: 201 lb 11.5 oz (91.5 kg) IBW/kg (Calculated) : 68.5  Vital Signs: Temp: 98.5 F (36.9 C) (11/06 0731) Temp Source: Axillary (11/06 0945) BP: 116/65 mmHg (11/06 1000) Pulse Rate: 83 (11/06 1000) Intake/Output from previous day: 11/05 0701 - 11/06 0700 In: 3047.3 [I.V.:890; NG/GT:1833.3; IV Piggyback:324] Out: 325 [Urine:75; Stool:250] Intake/Output from this shift: Total I/O In: 162 [I.V.:100; IV Piggyback:62] Out: -   Labs:  Recent Labs  05/20/14 0451 05/21/14 0425 05/22/14 0401  WBC 11.6* 12.2*  --   HGB 10.1* 10.1*  --   PLT 251 254  --   CREATININE 3.25*  --  3.16*   Estimated Creatinine Clearance: 18.9 mL/min (by C-G formula based on Cr of 3.16).  Recent Labs  05/22/14 0401  VANCORANDOM 15.0    Microbiology: Recent Results (from the past 720 hour(s))  Culture, blood (routine x 2) Call MD if unable to obtain prior to antibiotics being given     Status: None   Collection Time: 05/15/14  1:21 AM  Result Value Ref Range Status   Specimen Description BLOOD LEFT ANTECUBITAL  Final   Special Requests BOTTLES DRAWN AEROBIC AND ANAEROBIC 6CC  Final   Culture NO GROWTH 5 DAYS  Final   Report Status 05/20/2014 FINAL  Final  Culture, blood (routine x 2) Call MD if unable to obtain prior to antibiotics being given     Status: None   Collection Time: 05/15/14  1:21 AM  Result Value Ref Range Status   Specimen Description BLOOD LEFT ANTECUBITAL  Final   Special Requests BOTTLES DRAWN AEROBIC AND ANAEROBIC 9CC EACH  Final   Culture NO GROWTH 5 DAYS  Final   Report Status 05/20/2014 FINAL  Final  MRSA PCR Screening     Status: Abnormal   Collection Time: 05/15/14  1:40 AM  Result Value Ref Range Status   MRSA by PCR POSITIVE (A) NEGATIVE Final    Comment:        The GeneXpert MRSA Assay  (FDA approved for NASAL specimens only), is one component of a comprehensive MRSA colonization surveillance program. It is not intended to diagnose MRSA infection nor to guide or monitor treatment for MRSA infections. RESULT CALLED TO, READ BACK BY AND VERIFIED WITH: NEILSON T AT 0435 ON [id number] BY FORSYTH K  Clostridium Difficile by PCR     Status: None   Collection Time: 05/15/14  3:19 PM  Result Value Ref Range Status   C difficile by pcr NEGATIVE NEGATIVE Final    Anti-infectives    Start     Dose/Rate Route Frequency Ordered Stop   05/20/14 1200  ceFEPIme (MAXIPIME) 2 g in dextrose 5 % 50 mL IVPB     2 g100 mL/hr over 30 Minutes Intravenous Every M-W-F (Hemodialysis) 05/20/14 1115     05/20/14 1000  ceFEPIme (MAXIPIME) 1 g in dextrose 5 % 50 mL IVPB  Status:  Discontinued     1 g100 mL/hr over 30 Minutes Intravenous Every 24 hours 05/20/14 0825 05/20/14 1115   05/18/14 1400  vancomycin (VANCOCIN) IVPB 1000 mg/200 mL premix     1,000 mg200 mL/hr over 60 Minutes Intravenous Every M-W-F (Hemodialysis) 05/17/14 0852     05/17/14 0300  levofloxacin (LEVAQUIN) IVPB 750 mg  Status:  Discontinued     750 mg100 mL/hr over 90 Minutes Intravenous Every 48  hours 05/15/14 0746 05/15/14 1034   05/17/14 0300  levofloxacin (LEVAQUIN) IVPB 500 mg  Status:  Discontinued     500 mg100 mL/hr over 60 Minutes Intravenous Every 48 hours 05/15/14 1034 05/15/14 1454   05/16/14 1200  vancomycin (VANCOCIN) IVPB 1000 mg/200 mL premix  Status:  Discontinued     1,000 mg200 mL/hr over 60 Minutes Intravenous Every T-Th-Sa (Hemodialysis) 05/15/14 1034 05/17/14 0852   05/15/14 2200  vancomycin (VANCOCIN) IVPB 1000 mg/200 mL premix  Status:  Discontinued     1,000 mg200 mL/hr over 60 Minutes Intravenous Every 24 hours 05/15/14 0746 05/15/14 1034   05/15/14 1400  piperacillin-tazobactam (ZOSYN) IVPB 3.375 g  Status:  Discontinued     3.375 g12.5 mL/hr over 240 Minutes Intravenous 3 times per day 05/15/14 0746  05/15/14 1034   05/15/14 1400  piperacillin-tazobactam (ZOSYN) IVPB 2.25 g  Status:  Discontinued     2.25 g100 mL/hr over 30 Minutes Intravenous 3 times per day 05/15/14 1034 05/20/14 0825   05/15/14 0700  piperacillin-tazobactam (ZOSYN) IVPB 3.375 g  Status:  Discontinued     3.375 g12.5 mL/hr over 240 Minutes Intravenous  Once 05/15/14 0144 05/15/14 0144   05/15/14 0700  piperacillin-tazobactam (ZOSYN) IVPB 2.25 g     2.25 g100 mL/hr over 30 Minutes Intravenous  Once 05/15/14 0145 05/15/14 0739   05/15/14 0300  levofloxacin (LEVAQUIN) IVPB 750 mg     750 mg100 mL/hr over 90 Minutes Intravenous  Once 05/15/14 0204 05/15/14 0447   05/14/14 2200  piperacillin-tazobactam (ZOSYN) IVPB 3.375 g     3.375 g100 mL/hr over 30 Minutes Intravenous  Once 05/14/14 2155 05/14/14 2311   05/14/14 2200  vancomycin (VANCOCIN) IVPB 1000 mg/200 mL premix     1,000 mg200 mL/hr over 60 Minutes Intravenous  Once 05/14/14 2155 05/15/14 0016     Assessment: 73 yoF admitted from NH with worsening shortness of breath and altered mental status.  She has ESRD on HD MWF.  She was started on oral Levaquin prior to admission 10/29, now on Vancomycin and Cefepime IV.  CXR + LLL PNA.   She is afebrile.  Pre-HD Vancomycin level is on target.  Goal of Therapy:  Pre-HD Vancomycin level 15-25 mcg/ml  Plan:  Cefepime 2gm IV every HD Continue Vancomycin 1gm IV qHD  Check pre-HD level weekly or sooner if indicated. Monitor patient progress & cx data  Valrie HartHall, Tiffeny Minchew A 05/22/2014,10:24 AM

## 2014-05-22 NOTE — Progress Notes (Signed)
Nutrition Follow-up (Change in Status)  INTERVENTION:  Pt is transitioning to comfort care now and has referral to [facility] possible transfer tomorrow.  NUTRITION DIAGNOSIS: Increased protein-energy needs related to wound healing as evidenced by current nutrition  Guidelines; ongoing.   Goal: Meet nutrition requirements based on pt care decisions; met.  Monitor: nutrition support, skin assessments, labs and weight changes  73 y.o. female  Admitting Dx: Septic shock  ASSESSMENT: Pt is from [facility]. HX of MI, HTN, TIA, Thyriod dz, PAD, CABG, CHF, DM type 2 (04/10/14-A1C% 6.4). PEG (20 Fr.) placed during hospitalization in August.  Pt has HCAP with septic shock, acute encephalopathy, diarrhea, multiple wounds and ESRD with HD which was initiated in August.   Pt to be dialyzed today. Stool output has decreased.  Labs: BUN, Creat, Phosphorus trending up.   Height: Ht Readings from Last 1 Encounters:  05/15/14 5' 10"  (1.778 m)    Weight: Wt Readings from Last 1 Encounters:  05/22/14 201 lb 11.5 oz (91.5 kg)    Ideal Body Weight: 150# (68 kg)   Wt Readings from Last 10 Encounters:  05/22/14 201 lb 11.5 oz (91.5 kg)  03/06/14 189 lb 9.5 oz (86 kg)    Usual Body Weight: 190#   BMI:  Body mass index is 28.94 kg/(m^2).  overweight  Estimated Nutritional Needs: Kcal: 1500-1700 Protein: 115-133 gr Fluid: 1200 ml daily  Skin: multiple wounds: right heel deep tissue injury, left heel deep tissue injury,  unstagable to buttocks and right ischium, right anterior foot deep tissue injury  Diet Order:  NPO   EDUCATION NEEDS: -No education needs identified at this time   Intake/Output Summary (Last 24 hours) at 05/22/14 1136 Last data filed at 05/22/14 0800  Gross per 24 hour  Intake 3209.33 ml  Output    325 ml  Net 2884.33 ml    Last BM: 11/6 rectal tube (output 100 ml)  Labs:   Recent Labs Lab 05/17/14 0652 05/18/14 0638 05/20/14 0451 05/22/14 0401  NA 143  144 144 142  K 3.9 4.1 3.8 3.9  CL 103 103 103 98  CO2 28 26 28 29   BUN 26* 35* 43* 52*  CREATININE 2.44* 3.27* 3.25* 3.16*  CALCIUM 8.2* 8.5 8.4 8.7  PHOS 3.7 4.5  --  4.8*  GLUCOSE 135* 166* 315* 238*    CBG (last 3)   Recent Labs  05/21/14 2353 05/22/14 0348 05/22/14 0717  GLUCAP 233* 232* 284*    Scheduled Meds: . antiseptic oral rinse  7 mL Mouth Rinse BID  . ceFEPime (MAXIPIME) IV  2 g Intravenous Q M,W,F-HD  . collagenase   Topical Daily  . feeding supplement (VITAL HIGH PROTEIN)  1,000 mL Per Tube Q24H  . heparin      . insulin aspart  0-9 Units Subcutaneous 6 times per day  . insulin glargine  30 Units Subcutaneous BID  . levalbuterol  0.63 mg Nebulization Q6H  . sodium chloride  3 mL Intravenous Q12H  . vancomycin  1,000 mg Intravenous Q M,W,F-HD  . warfarin  2 mg Oral Once  . Warfarin - Pharmacist Dosing Inpatient   Does not apply Q24H    Continuous Infusions:   Past Medical History  Diagnosis Date  . History of MI (myocardial infarction)     PCI done @ [facility] (cannot get cath report on Care Everywhere(  . Essential hypertension   . TIA (transient ischemic attack)   . Thyroid disease   . Anxiety   .  Diabetes mellitus type 2 with peripheral artery disease     On insulin  . PAD (peripheral artery disease)   . Hyperlipidemia associated with type 2 diabetes mellitus   . A-fib   . ARF (acute renal failure)     Past Surgical History  Procedure Laterality Date  . Cardiac catheterization      PCI - unknown vessel or stent; unknown date; @ [facility].  . Abdominal hysterectomy    . Cholecystectomy    . Coronary artery bypass graft N/A 01/25/2014    Procedure: CORONARY ARTERY BYPASS GRAFTING TIMES FOUR USING LEFT INTERNAL MAMMARY ARTERY TO LAD, SAPHENOUS VEIN GRAFTS TO OM1, OM2, AND PDA;  Surgeon: Ivin Poot, MD;  Location: [place];  Service: Open Heart Surgery;  Laterality: N/A;  . Mitral valve replacement N/A 01/25/2014    Procedure: MITRAL  VALVE (MV) REPLACEMENT;  Surgeon: Ivin Poot, MD;  Location: [place];  Service: Open Heart Surgery;  Laterality: N/A;  . Av fistula placement Right 04/09/2014    Procedure: ARTERIOVENOUS (AV) FISTULA CREATION;  Surgeon: Angelia Mould, MD;  Location: [facility];  Service: Vascular;  Laterality: Right;    Colman Cater MS,RD,CSG,LDN Office: [phone] Pager: [phone]

## 2014-05-22 NOTE — Progress Notes (Signed)
Subjective: Interval History: None  Objective: Vital signs in last 24 hours: Temp:  [97.8 F (36.6 C)-99.3 F (37.4 C)] 98.5 F (36.9 C) (11/06 0731) Pulse Rate:  [79-88] 80 (11/06 0600) Resp:  [24-43] 41 (11/06 0600) BP: (93-117)/(46-64) 102/52 mmHg (11/06 0600) SpO2:  [95 %-100 %] 98 % (11/06 0600) FiO2 (%):  [35 %] 35 % (11/06 0312) Weight:  [87.7 kg (193 lb 5.5 oz)] 87.7 kg (193 lb 5.5 oz) (11/06 0500) Weight change: -3.745 kg (-8 lb 4.1 oz)  Intake/Output from previous day: 11/05 0701 - 11/06 0700 In: 3037.3 [I.V.:880; NG/GT:1833.3; IV Piggyback:324] Out: 325 [Urine:75; Stool:250] Intake/Output this shift:    Generally Patient awake but still does not communicate. She is on CPAP She is not in any apparent distress Chest :Decrease breath sound other wise seems to be clear Heart RRR SEM+ Abdomen: None tender No edema  Lab Results:  Recent Labs  05/20/14 0451 05/21/14 0425  WBC 11.6* 12.2*  HGB 10.1* 10.1*  HCT 35.9* 36.3  PLT 251 254   BMET:   Recent Labs  05/20/14 0451 05/22/14 0401  NA 144 142  K 3.8 3.9  CL 103 98  CO2 28 29  GLUCOSE 315* 238*  BUN 43* 52*  CREATININE 3.25* 3.16*  CALCIUM 8.4 8.7   No results for input(s): PTH in the last 72 hours. Iron Studies: No results for input(s): IRON, TIBC, TRANSFERRIN, FERRITIN in the last 72 hours.  Studies/Results: No results found.  I have reviewed the patient's current medications.  Assessment/Plan: AKI: Patient s/p dialysis on wendsday. Problem#2 AMS; Fluctuating . Today she is awake but does not answer qeustions Problem#3 HCAP: Patient on antibiotics. Patient afebrile and remains on antibiotics Problem#4 Anemia: Her hemoglobin is with in our target goal and stable  Problem#5 Hypotension : Her blood pressure seems better and SBP runs low normal Problem#6 Metabolic bone disease : Her calcium  is in range.  Problem#7 CAD s/p CABG 1]We will make arrangement for dialysis today 2]Try to  Remove  21/2 liters if her SBP >90 3]D/c lasix 4]Hold her Epogen as her hemoglobin is >12 5]Basic metabolic panel/ /cbc in am   LOS: 8 days   Shekela Goodridge S 05/22/2014,7:57 AM

## 2014-05-22 NOTE — Clinical Social Work Note (Signed)
MD discussed goals of care with pt's grandson, [name]. After discussing further with DSS and family, [name] is agreeable to comfort care and referral to [facility]. Referral made and [facility] will contact [name]. He plans to come to hospital this evening to see pt with family. Anticipate transfer to [facility] tomorrow. Support provided.   Derenda Fennel, [place] [phone]

## 2014-05-22 NOTE — Progress Notes (Addendum)
ANTICOAGULATION CONSULT NOTE -  Pharmacy Consult for Coumadin (chronic Rx PTA) Indication: atrial fibrillation / MVR  No Known Allergies  Patient Measurements: Height: 5\' 10"  (177.8 cm) Weight: 201 lb 11.5 oz (91.5 kg) IBW/kg (Calculated) : 68.5  Vital Signs: Temp: 98.5 F (36.9 C) (11/06 0731) Temp Source: Axillary (11/06 0945) BP: 116/71 mmHg (11/06 1015) Pulse Rate: 85 (11/06 1015)  Labs:  Recent Labs  05/20/14 0451 05/20/14 1419 05/21/14 0425 05/22/14 0401  HGB 10.1*  --  10.1*  --   HCT 35.9*  --  36.3  --   PLT 251  --  254  --   LABPROT  --  17.2* 25.0* 17.3*  INR  --  1.39 2.24* 1.40  CREATININE 3.25*  --   --  3.16*   Estimated Creatinine Clearance: 18.9 mL/min (by C-G formula based on Cr of 3.16).  Medical History: Past Medical History  Diagnosis Date  . History of MI (myocardial infarction)     PCI done @ [facility] (cannot get cath report on Care Everywhere(  . Essential hypertension   . TIA (transient ischemic attack)   . Thyroid disease   . Anxiety   . Diabetes mellitus type 2 with peripheral artery disease     On insulin  . PAD (peripheral artery disease)   . Hyperlipidemia associated with type 2 diabetes mellitus   . A-fib   . ARF (acute renal failure)    Medications:  Prescriptions prior to admission  Medication Sig Dispense Refill Last Dose  . Amino Acids-Protein Hydrolys (FEEDING SUPPLEMENT, PRO-STAT SUGAR FREE 64,) LIQD Give 30 mLs by tube 3 (three) times daily.    05/14/2014 at Unknown time  . amiodarone (PACERONE) 200 MG tablet Give 200 mg by tube daily.   05/14/2014 at Unknown time  . aspirin EC 81 MG tablet Give 81 mg by tube daily.   05/14/2014 at Unknown time  . famotidine (PEPCID) 20 MG tablet Give 20 mg by tube 2 (two) times daily.   05/14/2014 at Unknown time  . fluconazole (DIFLUCAN) 150 MG tablet Take 150 mg by mouth daily.   05/14/2014 at Unknown time  . furosemide (LASIX) 20 MG tablet Give 20 mg by tube See admin  instructions. Give 20mg  on Mon.,Wed.,Fri.,   05/13/2014 at Unknown time  . guaifenesin (ROBITUSSIN) 100 MG/5ML syrup Give 200 mg by tube 4 (four) times daily as needed for cough.    05/14/2014 at Unknown time  . Guar Gum (NUTRISOURCE FIBER) PACK Give 1 Package by tube daily.   unknown at Unknown time  . insulin aspart (NOVOLOG) 100 UNIT/ML injection Inject 0-12 Units into the skin See admin instructions. Sliding scale BG below 70= 0 units; 70 - 150 give 0 units; 151- 200 give 2 units; 201- 250 give 4 units; 251- 300 give 6 units; 301- 350 give 8 units; 351 -400 give 10 units; for BG above 400 give 12 units and call doctor   05/14/2014 at Unknown time  . insulin detemir (LEVEMIR) 100 UNIT/ML injection Inject 10 Units into the skin 2 (two) times daily.    05/14/2014 at Unknown time  . levofloxacin (LEVAQUIN) 500 MG tablet Place 500 mg into feeding tube daily. For 6 days (starting 05/14/2014)   05/14/2014 at Unknown time  . levothyroxine (SYNTHROID, LEVOTHROID) 88 MCG tablet Give 88 mcg by tube daily before breakfast.   05/14/2014 at Unknown time  . LORazepam (ATIVAN) 0.5 MG tablet Take 0.5 mg by mouth every 6 (six) hours  as needed for anxiety.   05/12/2014  . metoCLOPramide (REGLAN) 5 MG tablet Give 5 mg by tube every 6 (six) hours.   05/14/2014 at Unknown time  . midodrine (PROAMATINE) 5 MG tablet Give 5 mg by tube 3 (three) times daily with meals.   05/14/2014 at Unknown time  . mirtazapine (REMERON) 15 MG tablet Give 15 mg by tube at bedtime.   05/13/2014 at Unknown time  . potassium chloride 20 MEQ/15ML (10%) solution Give 20 mEq by tube 2 (two) times daily.    05/14/2014 at Unknown time  . sertraline (ZOLOFT) 50 MG tablet Give 50 mg by tube daily.   05/14/2014 at Unknown time  . sevelamer carbonate (RENVELA) 2.4 G PACK Give 2.4 g by tube daily.   05/14/2014 at Unknown time  . vitamin D, CHOLECALCIFEROL, 400 UNITS tablet Place 400 Units into feeding tube 2 (two) times daily.   05/14/2014 at Unknown  time  . warfarin (COUMADIN) 2 MG tablet Take 2 mg by mouth daily.   05/14/2014 at 1700  . acetaminophen (TYLENOL) 325 MG tablet Give 650 mg by tube every 6 (six) hours as needed for moderate pain or fever.   unknown  . acetaminophen (TYLENOL) 650 MG suppository Place 650 mg rectally every 6 (six) hours as needed for mild pain.   unknown  . dextrose (GLUTOSE) 40 % GEL Give 1 Tube [id number]71BeverelyLaTennova Healthcare - HartoMarland Kitchen2[facility]42mR0981[facility] Stockt26BeverelyLaMenlo Park Surgery Center LLMarland 67mi0981[facility](23BeverelyLaSurgicare Surgical Associates Of Engle67mo0981[facility](703BeverelyLaSt Vincent HospitaMarla6md0981[facility](30BeverelyLaMcbride Orthopedic HospitaMarland Kitchen2Winkler County MemoriaRedgLiCarloyn Jaegerhysic109ma0981[facility]57BeverelyLaBartow Regional Medical CenteMarland Kitchen2Leo44mN0981[facility]61BeverelyLaAesculapian Surgery Center LLC Dba Intercoastal Medical Group Amb34ml0981[facility](501BeverelyLaSt Alexius Medical CenteMarland Kitchen2University Pavilion - PsychiatriRedMarga52mi0981[facility] Ea35BeverelyLaBluegrass Surgery And Laser CenteMarland Kitchen2EdShepJasminVaughan37mR0981[facility] L(75BeverelyLaBayl51mr0981[facility](81BeverelyLaGrove Creek Medical CenteMarland Kitchen2Presence Saint JosepRedgLiCarShepJ67ms0981[facility](319BeverelyLaSkypark 63mu0981[facility] I43BeverelyLaScott County H7ms0981[facility]72BeverelyLaFair Park Surgery CenteMarland Kitchen2[facility] RedgLiCarloyn Jaeg528Mia3mi0981Shore59mR0981[facility]et(22BeverelyLaTria Orthopaedic Center WoodburMarland Kitche71m20981[facility]27BeverelyLaRipon Med CtMarland Kitchen2Monterey Penin71mu0981[facility] Hospit27BeverelyLaWilkes-Barre Veterans Affairs Med53mc0981Firelands Regional Medical Cent21BeverelyLaCarolina Center For Behavioral HealtMarland Kitchen2Atr52mu0981Beverly Hills Multispecialty Surgical Center L60BeverelyLaBon Secours Surgery Center At Harbour View LLC Dba Bon Secours Surgery Center At Harbour VieMarland Kitchen2Parad59ms0981[facility](44BeverelyLaErlanger Murphy Medical CenteMarland Kitchen2Cir[place] Hills BehavioRedgLiCarloyn Jaeg52mrCaroShepJasmine DeceThomas Memorial Hospital41mM0981[facility] (22BeverelyLaSt. Albans Community Living CenteMarland KitchenSouthern California Medical Gastro1mn0981[place] Surgic66ml0981[facility] Ro47BeverelyLaTrego County Lemke Memorial HospitaMarland Kitchen2Hosp Municipal [facility] Dr Rafael LRedgLiShepJasmineSelect Long Term Care [phone] Surgery Cente0981MaisiLewaynDebbe Deatra [phone] 74Jenna 33L52ALetitia Geannie Risendlerse Finlandrners1333NortheaSt Louis Spine And Or agon, human recombinant, (GLUCAGEN DIAGNOSTIC) 1 MG injection Inject 1 mg into the vein once as needed for low blood sugar.   unknown  . nitroGLYCERIN (NITROSTAT) 0.4 MG SL tablet Place 0.4 mg under the tongue every 5 (five) minutes as needed for chest pain.   unknown   Assessment: 73yo female on chronic Coumadin PTA for h/o afib/MVR.  INR was >2.0 on admission.  Home dose reportedly 2mg  daily.  Pt was in ICU with diarrhea and not eating therefore has not received any Coumadin during this admission prior to 05/19/2014.  INR subtherapeutic today but trending up.  Goal of Therapy:  INR 2.5 - 3   Plan:  Coumadin 2 mg po today x 1 INR daily  [name] A 05/22/2014,10:27 AM

## 2014-05-22 NOTE — Progress Notes (Addendum)
[facility] PROGRESS NOTE  [name]:[id number] DOB: [birth year] DOA: 05/14/2014 PCP: Mickle Plumb, NP  Assessment/Plan: Acute hypoxemic respiratory failure -Secondary to hospital-acquired pneumonia. -O2 sats are stable on nasal cannula oxygen. -continue vancomycin/cefepime until oral route is established. Consider swallow evaluation soon.  Hospital-acquired pneumonia  -See above for details.  Acute encephalopathy and superimposed dementia  -Confusion persists.  End-stage renal disease -Hemodialysis Monday Wednesday Friday  Chronic systolic CHF -Ejection fraction of 40-45% per echo in August 2015. -Currently appears euvolemic.  History of STEMI, CABG 4, mitral valve replacement, cardiogenic shock, prolonged stay on ventilator earlier this ear -goal INR between 2.5 and 3.5 given her prosthetic valve.  Multiple bilateral lower extremity wounds -Has been evaluated by Dr. Lovell Sheehan, no surgical intervention has been recommended at this point.   Code Status: DO NOT RESUSCITATE Family Communication: attempted calling daughter, however unable to contact and unavailable to leave voicemail  Disposition Plan: to be determined   Consultants:  Nephrology, Dr. Fausto Skillern   Antibiotics:  Vancomycin  Cefepime   Subjective: Confused, unable to answer questions  Objective: Filed Vitals:   05/22/14 0957 05/22/14 1000 05/22/14 1015 05/22/14 1030  BP:  116/65 116/71 102/64  Pulse: 87 83 85 84  Temp:      TempSrc:      Resp: 39 25 34 35  Height:      Weight:      SpO2: 97% 97% 100% 100%    Intake/Output Summary (Last 24 hours) at 05/22/14 1049 Last data filed at 05/22/14 0800  Gross per 24 hour  Intake 3209.33 ml  Output    325 ml  Net 2884.33 ml   Filed Weights   05/21/14 0500 05/22/14 0500 05/22/14 0945  Weight: 87.6 kg (193 lb 2 oz) 87.7 kg (193 lb 5.5 oz) 91.5 kg (201 lb 11.5 oz)    Exam:   General:  awake  Cardiovascular:  RRR  Respiratory: CTA B  Abdomen: S/NT/ND/+BS  Extremities: no C/C/E   Neurologic:  confused  Data Reviewed: Basic Metabolic Panel:  Recent Labs Lab 05/16/14 0549 05/17/14 0652 05/18/14 0638 05/20/14 0451 05/22/14 0401  NA 134* 143 144 144 142  K 4.2 3.9 4.1 3.8 3.9  CL 90* 103 103 103 98  CO2 24 28 26 28 29   GLUCOSE 235* 135* 166* 315* 238*  BUN 39* 26* 35* 43* 52*  CREATININE 2.66* 2.44* 3.27* 3.25* 3.16*  CALCIUM 7.9* 8.2* 8.5 8.4 8.7  PHOS 4.4 3.7 4.5  --  4.8*   Liver Function Tests: No results for input(s): AST, ALT, ALKPHOS, BILITOT, PROT, ALBUMIN in the last 168 hours. No results for input(s): LIPASE, AMYLASE in the last 168 hours. No results for input(s): AMMONIA in the last 168 hours. CBC:  Recent Labs Lab 05/16/14 0549 05/17/14 0652 05/18/14 0638 05/20/14 0451 05/21/14 0425  WBC 9.9 9.5 10.1 11.6* 12.2*  HGB 9.6* 9.4* 10.1* 10.1* 10.1*  HCT 33.6* 33.7* 35.5* 35.9* 36.3  MCV 96.6 98.8 98.6 99.4 98.6  PLT 281 279 272 251 254   Cardiac Enzymes:  Recent Labs Lab 05/15/14 1338  TROPONINI <0.30   BNP (last 3 results)  Recent Labs  03/16/14 0525 03/23/14 0500 05/14/14 2028  PROBNP 29632.0* 27500.0* 31667.0*   CBG:  Recent Labs Lab 05/21/14 1604 05/21/14 1919 05/21/14 2353 05/22/14 0348 05/22/14 0717  GLUCAP 248* 239* 233* 232* 284*    Recent Results (from the past 240 hour(s))  Culture, blood (routine x 2) Call MD if  unable to obtain prior to antibiotics being given     Status: None   Collection Time: 05/15/14  1:21 AM  Result Value Ref Range Status   Specimen Description BLOOD LEFT ANTECUBITAL  Final   Special Requests BOTTLES DRAWN AEROBIC AND ANAEROBIC 6CC  Final   Culture NO GROWTH 5 DAYS  Final   Report Status 05/20/2014 FINAL  Final  Culture, blood (routine x 2) Call MD if unable to obtain prior to antibiotics being given     Status: None   Collection Time: 05/15/14  1:21 AM  Result Value Ref Range Status   Specimen  Description BLOOD LEFT ANTECUBITAL  Final   Special Requests BOTTLES DRAWN AEROBIC AND ANAEROBIC 9CC EACH  Final   Culture NO GROWTH 5 DAYS  Final   Report Status 05/20/2014 FINAL  Final  MRSA PCR Screening     Status: Abnormal   Collection Time: 05/15/14  1:40 AM  Result Value Ref Range Status   MRSA by PCR POSITIVE (A) NEGATIVE Final    Comment:        The GeneXpert MRSA Assay (FDA approved for NASAL specimens only), is one component of a comprehensive MRSA colonization surveillance program. It is not intended to diagnose MRSA infection nor to guide or monitor treatment for MRSA infections. RESULT CALLED TO, READ BACK BY AND VERIFIED WITH: NEILSON T AT 0435 ON [id number] BY FORSYTH K  Clostridium Difficile by PCR     Status: None   Collection Time: 05/15/14  3:19 PM  Result Value Ref Range Status   C difficile by pcr NEGATIVE NEGATIVE Final     Studies: No results found.  Scheduled Meds: . antiseptic oral rinse  7 mL Mouth Rinse BID  . ceFEPime (MAXIPIME) IV  2 g Intravenous Q M,W,F-HD  . collagenase   Topical Daily  . feeding supplement (VITAL HIGH PROTEIN)  1,000 mL Per Tube Q24H  . heparin      . insulin aspart  0-9 Units Subcutaneous 6 times per day  . insulin glargine  30 Units Subcutaneous BID  . levalbuterol  0.63 mg Nebulization Q6H  . sodium chloride  3 mL Intravenous Q12H  . vancomycin  1,000 mg Intravenous Q M,W,F-HD  . warfarin  2 mg Oral Once  . 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 Greater than 50% of this time was spent in direct contact with the patient coordinating care.    Chaya JanHERNANDEZ ACOSTA,Kasy Iann[place]one  [facility] Pager [phone]  If 7PM-7AM, please contact night-coverage at [email or web address], password [email or web address] 05/22/2014, 10:49 AM  LOS: 8 days       Addendum: Discussed poor long-term prognosis with patient's grandson and guardian [name]. He has discussed with the rest of his family and has agreed to transition to comfort care with cessation of HD and transfer to [facility] in the morning.  Peggye PittEstela Hernandez, MD [facility] Pager: [phone]

## 2014-05-23 LAB — GLUCOSE, CAPILLARY
Glucose-Capillary: 77 mg/dL (ref 70–99)
Glucose-Capillary: 74 mg/dL (ref 70–99)
Glucose-Capillary: 74 mg/dL (ref 70–99)

## 2014-05-23 LAB — CBC
HCT: 38.4 % (ref 36.0–46.0)
Hemoglobin: 11.1 g/dL — ABNORMAL LOW (ref 12.0–15.0)
MCH: 28.5 pg (ref 26.0–34.0)
MCHC: 28.9 g/dL — AB (ref 30.0–36.0)
MCV: 98.7 fL (ref 78.0–100.0)
Platelets: 238 10*3/uL (ref 150–400)
RBC: 3.89 MIL/uL (ref 3.87–5.11)
RDW: 27.9 % — ABNORMAL HIGH (ref 11.5–15.5)
WBC: 9.5 10*3/uL (ref 4.0–10.5)

## 2014-05-23 LAB — BASIC METABOLIC PANEL
Anion gap: 12 (ref 5–15)
BUN: 30 mg/dL — AB (ref 6–23)
CHLORIDE: 104 meq/L (ref 96–112)
CO2: 28 mEq/L (ref 19–32)
CREATININE: 2.29 mg/dL — AB (ref 0.50–1.10)
Calcium: 8.8 mg/dL (ref 8.4–10.5)
GFR calc non Af Amer: 20 mL/min — ABNORMAL LOW (ref >90)
GFR, EST AFRICAN AMERICAN: 23 mL/min — AB (ref >90)
GLUCOSE: 80 mg/dL (ref 70–99)
Potassium: 4.2 mEq/L (ref 3.7–5.3)
Sodium: 144 mEq/L (ref 137–147)

## 2014-05-23 LAB — PROTIME-INR
INR: 1.36 (ref 0.00–1.49)
PROTHROMBIN TIME: 16.9 s — AB (ref 11.6–15.2)

## 2014-05-23 LAB — PHOSPHORUS: PHOSPHORUS: 3.7 mg/dL (ref 2.3–4.6)

## 2014-05-23 MED ORDER — MORPHINE SULFATE (CONCENTRATE) 10 MG /0.5 ML PO SOLN: 10.0000 mg | ORAL | Status: AC | PRN

## 2014-05-23 MED ORDER — LORAZEPAM 1 MG PO TABS: 1.0000 mg | ORAL_TABLET | Freq: Three times a day (TID) | ORAL | Status: AC

## 2014-05-23 MED ORDER — WARFARIN SODIUM 5 MG PO TABS: 5.0000 mg | ORAL_TABLET | Freq: Once | ORAL | Status: DC

## 2014-05-23 NOTE — Discharge Summary (Signed)
Physician Discharge Summary  [name] WJX:[id number] DOB: 1941/06/09 DOA: 05/14/2014  PCP: Mickle Plumb, NP  Admit date: 05/14/2014 Discharge date: 05/23/2014  Time spent: 45 minutes  Recommendations for Outpatient Follow-up:  -will be discharged to hospice home today.   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   A-fib   Diabetes   CKD (chronic kidney disease) stage V requiring chronic dialysis   HCAP (healthcare-associated pneumonia)   S/P MVR (mitral valve replacement) 2015   Acute respiratory failure with hypoxia   Acute encephalopathy   Discharge Condition: guarded  Filed Weights   05/22/14 0500 05/22/14 0945 05/23/14 0616  Weight: 87.7 kg (193 lb 5.5 oz) 91.5 kg (201 lb 11.5 oz) 80.7 kg (177 lb 14.6 oz)    History of present illness:  73 yo female recent h/o stemi/4v cabg/cardiogenic shock/prolonged vent/newly started on dialysis at [facility] about 6 weeks ago was d/c to an [facility] and is now at [facility] sent in here from [facility] for worsening sob and ams. Pt was diagnosed with pna yesterday at [facility] and started on levaquin. She received her dialysis today but unknown if that was a complete course or not. Pt is more awake since arrival but still minimally responsive and appears fluid overloaded. She opens her eyes and looks at various people in the room, looks towards voice but cannot follow simple commands. She is in overall poor shape with multiple bed wounds, along with ischemic changes to her feet that are not new with h/o PVD. No DNR paperwork sent with her from [facility].  Hospital Course:   Acute hypoxemic respiratory failure Hospital-acquired pneumonia Acute encephalopathy superimposed on dementia End-stage renal disease Chronic systolic  CHF History of ST elevated MI, CABG 4, mitral valve replacement, cardiogenic shock, prolonged stay on ventilator earlier this year Multiple lower extremity and sacral wounds   Patient's acute encephalopathy and hypoxemic respiratory failure have been attributed to hospital-acquired pneumonia. This has improved somewhat on antibiotics, however she has extremely poor long-term prognosis. I had an extensive conversation with patient's grandson and guardian [name], together we have made the decision to discontinue dialysis and proceed with transfer to a hospice home as patient would not have wanted to be kept alive on machines as per her wishes. Patient remains unable to answer questions. Hospice home may access her diatek catheter for any medication needs. Will be discharged on comfort medications only.  Procedures:  none   Consultations:  Nephrology, Dr. Fausto Skillern  Discharge Instructions     Medication List    STOP taking these medications        amiodarone 200 MG tablet  Commonly known as:  PACERONE     aspirin EC 81 MG tablet     dextrose 40 % Gel  Commonly known as:  GLUTOSE     dextrose 50 % solution     famotidine 20 MG tablet  Commonly known as:  PEPCID     feeding supplement (PRO-STAT SUGAR FREE 64) Liqd     fluconazole 150 MG tablet  Commonly known as:  DIFLUCAN     furosemide 20 MG tablet  Commonly known as:  LASIX     GLUCAGEN DIAGNOSTIC 1 MG injection  Generic drug:  glucagon (human recombinant)     guaifenesin 100  MG/5ML syrup  Commonly known as:  ROBITUSSIN     insulin aspart 100 UNIT/ML injection  Commonly known as:  novoLOG     insulin detemir 100 UNIT/ML injection  Commonly known as:  LEVEMIR     levofloxacin 500 MG tablet  Commonly known as:  LEVAQUIN     levothyroxine 88 MCG tablet  Commonly known as:  SYNTHROID, LEVOTHROID     metoCLOPramide 5 MG tablet  Commonly known as:  REGLAN     midodrine 5 MG tablet  Commonly known as:   PROAMATINE     mirtazapine 15 MG tablet  Commonly known as:  REMERON     nitroGLYCERIN 0.4 MG SL tablet  Commonly known as:  NITROSTAT     NUTRISOURCE FIBER Pack     potassium chloride 20 MEQ/15ML (10%) solution     RENVELA 2.4 G Pack  Generic drug:  sevelamer carbonate     sertraline 50 MG tablet  Commonly known as:  ZOLOFT     vitamin D (CHOLECALCIFEROL) 400 UNITS tablet     warfarin 2 MG tablet  Commonly known as:  COUMADIN      TAKE these medications        acetaminophen 325 MG tablet  Commonly known as:  TYLENOL  Give 650 mg by tube every 6 (six) hours as needed for moderate pain or fever.     LORazepam 1 MG tablet  Commonly known as:  ATIVAN  Take 1 tablet (1 mg total) by mouth every 8 (eight) hours.     morphine CONCENTRATE 10 mg / 0.5 ml concentrated solution  Take 0.5 mLs (10 mg total) by mouth every 2 (two) hours as needed for severe pain.       No Known Allergies    The results of significant diagnostics from this hospitalization (including imaging, microbiology, ancillary and laboratory) are listed below for reference.    Significant Diagnostic Studies: Ct Head Wo Contrast  05/14/2014   CLINICAL DATA:  Altered mental status, respiratory distress  EXAM: CT HEAD WITHOUT CONTRAST  TECHNIQUE: Contiguous axial images were obtained from the base of the skull through the vertex without intravenous contrast.  COMPARISON:  None.  FINDINGS: There is no evidence of mass effect, midline shift, or extra-axial fluid collections. There is no evidence of a space-occupying lesion or intracranial hemorrhage. There is no evidence of a cortical-based area of acute infarction. There is generalized cerebral atrophy. There is periventricular white matter low attenuation likely secondary to microangiopathy.  The ventricles and sulci are appropriate for the patient's age. The basal cisterns are patent.  Visualized portions of the orbits are unremarkable. Mild bilateral sphenoid  sinus and right ethmoid sinus mucosal thickening. Small right mastoid effusion. Cerebrovascular atherosclerotic calcifications are noted.  The osseous structures are unremarkable.  IMPRESSION: 1. No acute intracranial pathology.   Electronically Signed   By: Elige Ko   On: 05/14/2014 22:55   Ct Chest Wo Contrast  05/15/2014   CLINICAL DATA:  Sepsis.  EXAM: CT CHEST WITHOUT CONTRAST  TECHNIQUE: Multidetector CT imaging of the chest was performed following the standard protocol without IV contrast.  COMPARISON:  CT scan of February 09, 2014.  FINDINGS: No pneumothorax is noted. Moderate bilateral pleural effusions are noted with adjacent atelectasis of the lower lobes. There may be some degree of loculation involving the superior portion of the left pleural effusion. Mild ascites is noted in the visualized portion of the upper abdomen. No significant osseous abnormality is noted.  Status post coronary artery bypass graft. Atherosclerotic calcifications of thoracic aorta are noted. Right internal jugular dialysis catheter is noted with distal tip in expected position of the cavoatrial junction. No significant mediastinal mass or adenopathy is noted.  IMPRESSION: Moderate bilateral pleural effusions are noted with adjacent atelectasis of the lower lobes. The superior aspect of the left pleural appears to be loculated.   Electronically Signed   By: Roque LiasJames  Green M.D.   On: 05/15/2014 12:58   Dg Chest Port 1 View  05/19/2014   CLINICAL DATA:  Status post central line placement.  EXAM: PORTABLE CHEST - 1 VIEW  COMPARISON:  May 18, 2014.  FINDINGS: Stable cardiomegaly. Status post cardiac valve repair. Right internal jugular dialysis catheter is unchanged in position. Stable central pulmonary vascular congestion is noted. Stable mild left pleural effusion is noted. Interval placement of left internal jugular catheter line with distal tip in the expected position of the SVC. No pneumothorax is noted.  IMPRESSION:  Interval placement of left internal jugular catheter line with distal tip in expected position of the SVC. No pneumothorax is noted. Stable central pulmonary vascular congestion is noted with associated left pleural effusion.   Electronically Signed   By: Roque LiasJames  Green M.D.   On: 05/19/2014 14:37   Dg Chest Port 1 View  05/18/2014   CLINICAL DATA:  Difficulty breathing and hypotension; altered mental status  EXAM: PORTABLE CHEST - 1 VIEW  COMPARISON:  Chest radiograph May 14, 2014 chest CT May 15, 2014  FINDINGS: Left effusion is slightly larger. There is a smaller right effusion. There is interstitial edema. There is alveolar opacity in both lung bases. Heart is enlarged with pulmonary venous hypertension. Patient is status post mitral valve replacement. Central catheter tip is at the cavoatrial junction. No pneumothorax.  IMPRESSION: Evidence of congestive heart failure. Opacity in the lung bases may represent alveolar edema or possibly superimposed pneumonia. Both entities may exist concurrently. No pneumothorax.   Electronically Signed   By: Bretta BangWilliam  Woodruff M.D.   On: 05/18/2014 07:26   Dg Chest Portable 1 View  05/14/2014   CLINICAL DATA:  Difficulty breathing and altered mental status ; acute renal failure  EXAM: PORTABLE CHEST - 1 VIEW  COMPARISON:  March 29, 2014  FINDINGS: There is consolidation in the left lower lobe with minimal left effusion. Lungs elsewhere clear. Heart is mildly enlarged with pulmonary vascularity within normal limits. No adenopathy. Central catheter tip is at the cavoatrial junction. No pneumothorax. Patient is status post mitral valve replacement.  IMPRESSION: Left lower lobe consolidation with small left effusion. Right lung clear. Heart prominent. Patient is status post mitral valve replacement.   Electronically Signed   By: Bretta BangWilliam  Woodruff M.D.   On: 05/14/2014 20:58    Microbiology: Recent Results (from the past 240 hour(s))  Culture, blood (routine x  2) Call MD if unable to obtain prior to antibiotics being given     Status: None   Collection Time: 05/15/14  1:21 AM  Result Value Ref Range Status   Specimen Description BLOOD LEFT ANTECUBITAL  Final   Special Requests BOTTLES DRAWN AEROBIC AND ANAEROBIC 6CC  Final   Culture NO GROWTH 5 DAYS  Final   Report Status 05/20/2014 FINAL  Final  Culture, blood (routine x 2) Call MD if unable to obtain prior to antibiotics being given     Status: None   Collection Time: 05/15/14  1:21 AM  Result Value Ref Range Status   Specimen Description  BLOOD LEFT ANTECUBITAL  Final   Special Requests BOTTLES DRAWN AEROBIC AND ANAEROBIC [facility] EACH  Final   Culture NO GROWTH 5 DAYS  Final   Report Status 05/20/2014 FINAL  Final  MRSA PCR Screening     Status: Abnormal   Collection Time: 05/15/14  1:40 AM  Result Value Ref Range Status   MRSA by PCR POSITIVE (A) NEGATIVE Final    Comment:        The GeneXpert MRSA Assay (FDA approved for NASAL specimens only), is one component of a comprehensive MRSA colonization surveillance program. It is not intended to diagnose MRSA infection nor to guide or monitor treatment for MRSA infections. RESULT CALLED TO, READ BACK BY AND VERIFIED WITH: NEILSON T AT 0435 ON [id number] BY FORSYTH K  Clostridium Difficile by PCR     Status: None   Collection Time: 05/15/14  3:19 PM  Result Value Ref Range Status   C difficile by pcr NEGATIVE NEGATIVE Final     Labs: Basic Metabolic Panel:  Recent Labs Lab 05/17/14 0652 05/18/14 0638 05/20/14 0451 05/22/14 0401 05/23/14 0533  NA 143 144 144 142 144  K 3.9 4.1 3.8 3.9 4.2  CL 103 103 103 98 104  CO2 28 26 28 29 28   GLUCOSE 135* 166* 315* 238* 80  BUN 26* 35* 43* 52* 30*  CREATININE 2.44* 3.27* 3.25* 3.16* 2.29*  CALCIUM 8.2* 8.5 8.4 8.7 8.8  PHOS 3.7 4.5  --  4.8* 3.7   Liver Function Tests: No results for input(s): AST, ALT, ALKPHOS, BILITOT, PROT, ALBUMIN in the last 168 hours. No results for input(s):  LIPASE, AMYLASE in the last 168 hours. No results for input(s): AMMONIA in the last 168 hours. CBC:  Recent Labs Lab 05/17/14 0652 05/18/14 0638 05/20/14 0451 05/21/14 0425 05/23/14 0533  WBC 9.5 10.1 11.6* 12.2* 9.5  HGB 9.4* 10.1* 10.1* 10.1* 11.1*  HCT 33.7* 35.5* 35.9* 36.3 38.4  MCV 98.8 98.6 99.4 98.6 98.7  PLT 279 272 251 254 238   Cardiac Enzymes: No results for input(s): CKTOTAL, CKMB, CKMBINDEX, TROPONINI in the last 168 hours. BNP: BNP (last 3 results)  Recent Labs  03/16/14 0525 03/23/14 0500 05/14/14 2028  PROBNP 29632.0* 27500.0* 31667.0*   CBG:  Recent Labs Lab 05/22/14 1946 05/22/14 2320 05/23/14 0025 05/23/14 0439 05/23/14 0808  GLUCAP 167* 75 77 74 74       Signed:  HERNANDEZ ACOSTA,Vanice Rappa  [facility] Pager: [phone] 05/23/2014, 9:52 AM

## 2014-05-23 NOTE — Progress Notes (Signed)
Subjective: Interval History: Patient still does nor communicate but seems to say yes or no by shacking her head. No difficulty in breathing but possible abdominal pain on palpation  Objective: Vital signs in last 24 hours: Temp:  [97.1 F (36.2 C)-99.7 F (37.6 C)] 97.1 F (36.2 C) (11/07 0800) Pulse Rate:  [80-91] 85 (11/07 0800) Resp:  [20-43] 20 (11/07 0800) BP: (88-132)/(51-71) 132/68 mmHg (11/07 0800) SpO2:  [91 %-100 %] 99 % (11/07 0800) FiO2 (%):  [35 %] 35 % (11/07 0249) Weight:  [80.7 kg (177 lb 14.6 oz)-91.5 kg (201 lb 11.5 oz)] 80.7 kg (177 lb 14.6 oz) (11/07 0616) Weight change: 3.8 kg (8 lb 6 oz)  Intake/Output from previous day: 11/06 0701 - 11/07 0700 In: 1702 [I.V.:230; NG/GT:960; IV Piggyback:312] Out: 3825 [Urine:100; Stool:770] Intake/Output this shift: Total I/O In: 70 [I.V.:30; Other:40] Out: -   Generally Patient a wide awake seems responding better She is not in any apparent distress Chest :Decrease breath sound other wise seems to be clear Heart RRR SEM+ Abdomen: soft ,none tender and positive bowl sound Extremities : no edema  Lab Results:  Recent Labs  05/21/14 0425 05/23/14 0533  WBC 12.2* 9.5  HGB 10.1* 11.1*  HCT 36.3 38.4  PLT 254 238   BMET:   Recent Labs  05/22/14 0401 05/23/14 0533  NA 142 144  K 3.9 4.2  CL 98 104  CO2 29 28  GLUCOSE 238* 80  BUN 52* 30*  CREATININE 3.16* 2.29*  CALCIUM 8.7 8.8   No results for input(s): PTH in the last 72 hours. Iron Studies: No results for input(s): IRON, TIBC, TRANSFERRIN, FERRITIN in the last 72 hours.  Studies/Results: No results found.  I have reviewed the patient's current medications.  Assessment/Plan: AKI:  Patient continue to require dialysis.Presently oliguric and potassium is normal Problem#2 AMS; Fluctuating . She seems more awake today Problem#3 HCAP: Patient on antibiotics. Patient afebrile and her white blood cell is normal today Problem#4 Anemia: Her hemoglobin  is with in our target goal and stable  Problem#5 Hypotension : Her blood pressure seems better  Problem#6 Metabolic bone disease : Her calcium and her phosphorus is in range. Presently not on a binder Problem#7 CAD s/p CABG 1] Patient does not require dialysis 2]Basic metabolic panel in am   LOS: 9 days   Tove Wideman S 05/23/2014,8:19 AM

## 2014-05-23 NOTE — Progress Notes (Signed)
ANTICOAGULATION CONSULT NOTE -  Pharmacy Consult for Coumadin (chronic Rx PTA) Indication: atrial fibrillation / MVR  No Known Allergies  Patient Measurements: Height: 5\' 10"  (177.8 cm) Weight: 177 lb 14.6 oz (80.7 kg) IBW/kg (Calculated) : 68.5  Vital Signs: Temp: 97.1 F (36.2 C) (11/07 0800) Temp Source: Axillary (11/07 0800) BP: 132/68 mmHg (11/07 0800) Pulse Rate: 85 (11/07 0800)  Labs:  Recent Labs  05/21/14 0425 05/22/14 0401 05/23/14 0533  HGB 10.1*  --  11.1*  HCT 36.3  --  38.4  PLT 254  --  238  LABPROT 25.0* 17.3* 16.9*  INR 2.24* 1.40 1.36  CREATININE  --  3.16* 2.29*   Estimated Creatinine Clearance: 23 mL/min (by C-G formula based on Cr of 2.29).  Medical History: Past Medical History  Diagnosis Date  . History of MI (myocardial infarction)     PCI done @ [facility] (cannot get cath report on Care Everywhere(  . Essential hypertension   . TIA (transient ischemic attack)   . Thyroid disease   . Anxiety   . Diabetes mellitus type 2 with peripheral artery disease     On insulin  . PAD (peripheral artery disease)   . Hyperlipidemia associated with type 2 diabetes mellitus   . A-fib   . ARF (acute renal failure)    Medications:  Prescriptions prior to admission  Medication Sig Dispense Refill Last Dose  . Amino Acids-Protein Hydrolys (FEEDING SUPPLEMENT, PRO-STAT SUGAR FREE 64,) LIQD Give 30 mLs by tube 3 (three) times daily.    05/14/2014 at Unknown time  . amiodarone (PACERONE) 200 MG tablet Give 200 mg by tube daily.   05/14/2014 at Unknown time  . aspirin EC 81 MG tablet Give 81 mg by tube daily.   05/14/2014 at Unknown time  . famotidine (PEPCID) 20 MG tablet Give 20 mg by tube 2 (two) times daily.   05/14/2014 at Unknown time  . fluconazole (DIFLUCAN) 150 MG tablet Take 150 mg by mouth daily.   05/14/2014 at Unknown time  . furosemide (LASIX) 20 MG tablet Give 20 mg by tube See admin instructions. Give 20mg  on Mon.,Wed.,Fri.,   05/13/2014 at  Unknown time  . guaifenesin (ROBITUSSIN) 100 MG/5ML syrup Give 200 mg by tube 4 (four) times daily as needed for cough.    05/14/2014 at Unknown time  . Guar Gum (NUTRISOURCE FIBER) PACK Give 1 Package by tube daily.   unknown at Unknown time  . insulin aspart (NOVOLOG) 100 UNIT/ML injection Inject 0-12 Units into the skin See admin instructions. Sliding scale BG below 70= 0 units; 70 - 150 give 0 units; 151- 200 give 2 units; 201- 250 give 4 units; 251- 300 give 6 units; 301- 350 give 8 units; 351 -400 give 10 units; for BG above 400 give 12 units and call doctor   05/14/2014 at Unknown time  . insulin detemir (LEVEMIR) 100 UNIT/ML injection Inject 10 Units into the skin 2 (two) times daily.    05/14/2014 at Unknown time  . levofloxacin (LEVAQUIN) 500 MG tablet Place 500 mg into feeding tube daily. For 6 days (starting 05/14/2014)   05/14/2014 at Unknown time  . levothyroxine (SYNTHROID, LEVOTHROID) 88 MCG tablet Give 88 mcg by tube daily before breakfast.   05/14/2014 at Unknown time  . LORazepam (ATIVAN) 0.5 MG tablet Take 0.5 mg by mouth every 6 (six) hours as needed for anxiety.   05/12/2014  . metoCLOPramide (REGLAN) 5 MG tablet Give 5 mg by tube every  6 (six) hours.   05/14/2014 at Unknown time  . midodrine (PROAMATINE) 5 MG tablet Give 5 mg by tube 3 (three) times daily with meals.   05/14/2014 at Unknown time  . mirtazapine (REMERON) 15 MG tablet Give 15 mg by tube at bedtime.   05/13/2014 at Unknown time  . potassium chloride 20 MEQ/15ML (10%) solution Give 20 mEq by tube 2 (two) times daily.    05/14/2014 at Unknown time  . sertraline (ZOLOFT) 50 MG tablet Give 50 mg by tube daily.   05/14/2014 at Unknown time  . sevelamer carbonate (RENVELA) 2.4 G PACK Give 2.4 g by tube daily.   05/14/2014 at Unknown time  . vitamin D, CHOLECALCIFEROL, 400 UNITS tablet Place 400 Units into feeding tube 2 (two) times daily.   05/14/2014 at Unknown time  . warfarin (COUMADIN) 2 MG tablet Take 2 mg by mouth  daily.   05/14/2014 at 1700  . acetaminophen (TYLENOL) 325 MG tablet Give 650 mg by tube every 6 (six) hours as needed for moderate pain or fever.   unknown  . acetaminophen (TYLENOL) 650 MG suppository Place 650 mg rectally every 6 (six) hours as needed for mild pain.   unknown  . dextrose (GLUTOSE) 40 % GEL Give 1 Tube by tube as needed for low blood sugar.    unknown  . dextrose 50 % solution Inject 11-25 mLs into the vein as needed for low blood sugar. Sliding scale: for low BG 25ml < 22; 23ml = 22-28; 21ml = 29-35; 19ml = 36-41; 17ml = 42-48; 15ml = 49- 55; 1013ml= 56 - 61; 7011ml= 62- 70; call md >70   unknown  . glucagon, human recombinant, (GLUCAGEN DIAGNOSTIC) 1 MG injection Inject 1 mg into the vein once as needed for low blood sugar.   unknown  . nitroGLYCERIN (NITROSTAT) 0.4 MG SL tablet Place 0.4 mg under the tongue every 5 (five) minutes as needed for chest pain.   unknown   Assessment: 73yo female on chronic Coumadin PTA for h/o afib/MVR.  INR was >2.0 on admission.  Home dose reportedly 2mg  daily.  Pt was in ICU with diarrhea and not eating therefore did not receive any Coumadin during this admission prior to 05/19/2014.  INR subtherapeutic today and is trending down toward baseline.  Goal of Therapy:  INR 2.5 - 3   Plan:  Coumadin 5 mg po today x 1 INR daily  Lamark Schue A 05/23/2014,8:57 AM

## 2014-05-23 NOTE — Progress Notes (Signed)
05/23/14 1100 Patient discharge to [facility], called report to Consuello Masse, RN receiving nurse. Discharge packet with D/c summary, prescriptions, DNR prepared to be sent with patient. Notified patient will have dialysis catheter, flexiseal, foley catheter in place at discharge per MD request. Stated okay. Requested family be notified to come and sign paperwork for patient admission to hospice. Attempted to call Daishia Schaafsma, grandson and guardian per report. Unable to reach at this time. Will continue trying to reach him. Notified EMS of transport need. Awaiting EMS arrival for discharge. Earnstine Regal, RN

## 2014-05-23 NOTE — Progress Notes (Signed)
05/23/14 1204 Attempted to reach patient's grandson Radia Falso and daughter Xavianna Sunderman listed as contacts, unable to reach either before EMS arrival for discharge. Notified nursing supervisor, stated family aware of discharge plan, so okay to continue with discharge and hospice try to reach them if okay with hospice nurse. Notified Consuello Masse, RN at [facility] of being unable to reach family. Stated okay for her to come and they will continue trying to reach them. Contact numbers confirmed with hospice nurse. Pressure dressing dry and intact from central line removal per Billy Coast, RN. Pt left floor in stable condition, comfort care with EMS transport. Discharge packet sent with patient. Earnstine Regal, RN

## 2014-05-23 NOTE — Progress Notes (Signed)
05/23/14 0927 Patient CBG: 72 this am, have been in the 70s overnight. Lantus 30 units SQ ordered for this am. Notified Dr. Ardyth Harps, do not give Lantus dose per MD. Patient to discharge to [facility] today. Flexiseal, foley catheter, diatek dialysis catheter to remain in place at discharge. IJ catheter may be d/c'd per MD. Earnstine Regal, RN

## 2014-06-02 ENCOUNTER — Encounter (HOSPITAL_COMMUNITY): Payer: Medicare Other | Admitting: Speech Pathology

## 2014-06-02 ENCOUNTER — Other Ambulatory Visit (HOSPITAL_COMMUNITY): Payer: Medicare Other

## 2014-06-16 DEATH — deceased

## 2014-06-25 ENCOUNTER — Encounter (HOSPITAL_COMMUNITY): Payer: Self-pay | Admitting: Cardiology

## 2014-07-30 ENCOUNTER — Encounter (HOSPITAL_COMMUNITY): Payer: Self-pay | Admitting: Internal Medicine

## 2014-10-15 IMAGING — CR DG CHEST 1V PORT
1 series · 1 of 1 positions shown · non-contrast
Comparison: 03/05/2014.

CLINICAL DATA: Interstitial edema.

EXAM:
PORTABLE CHEST - 1 VIEW

[portable]
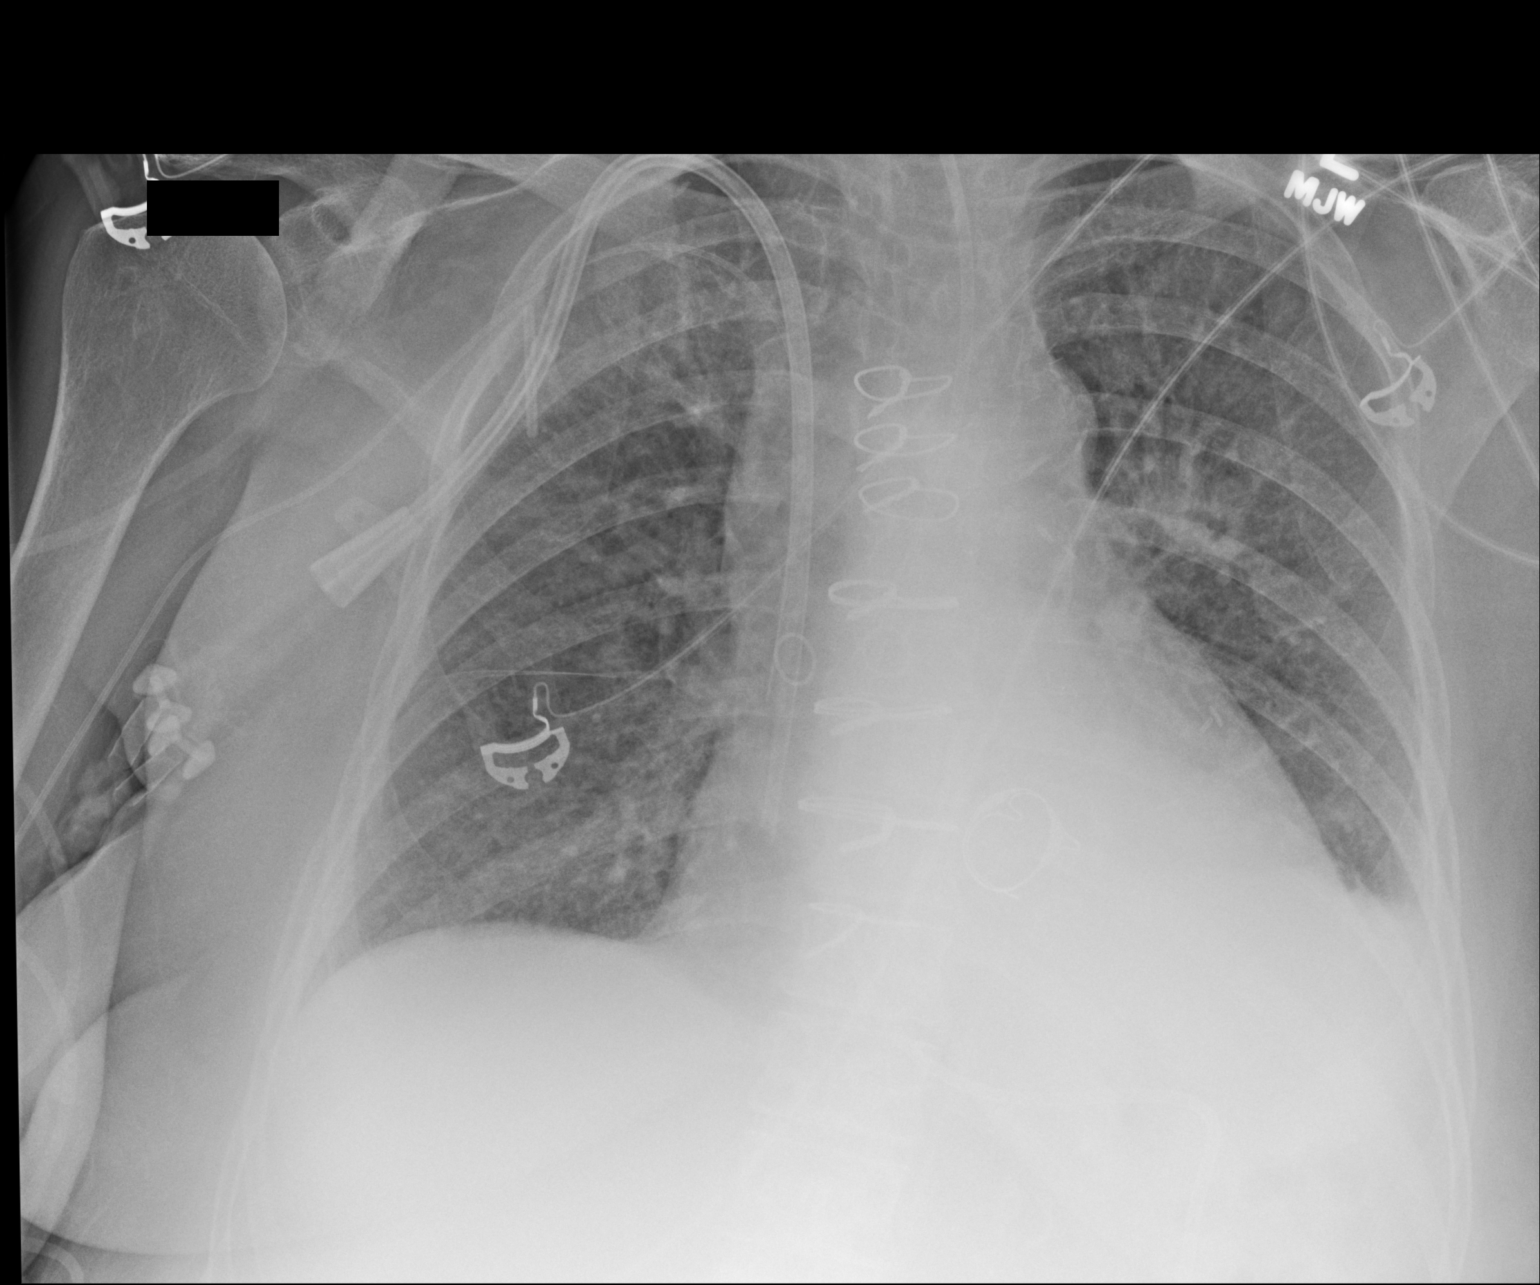

[1 of 1 positions shown; findings below may reference images not displayed]

FINDINGS: NG tube and dual lumen right IJ line in stable position. Right PICC
line in stable position. Cardiomegaly. Cardiac valve replacement.
Prior CABG. Interim minimal clearing of pulmonary interstitial
changes of interstitial edema. Persistent left pleural effusion. No
pneumothorax.
IMPRESSION: 1. Line and tube positions stable.
2. Interim minimal clearing of bilateral pulmonary interstitial
edema.
3. Stable cardiomegaly. Prior cardiac valve replacement. Prior CABG.

## 2014-10-15 IMAGING — CR DG ABD PORTABLE 1V
1 series · 1 of 1 positions shown · non-contrast
Comparison: 03/02/2014

CLINICAL DATA: Evaluate feeding tube position

EXAM:
PORTABLE ABDOMEN - 1 VIEW

[AP]
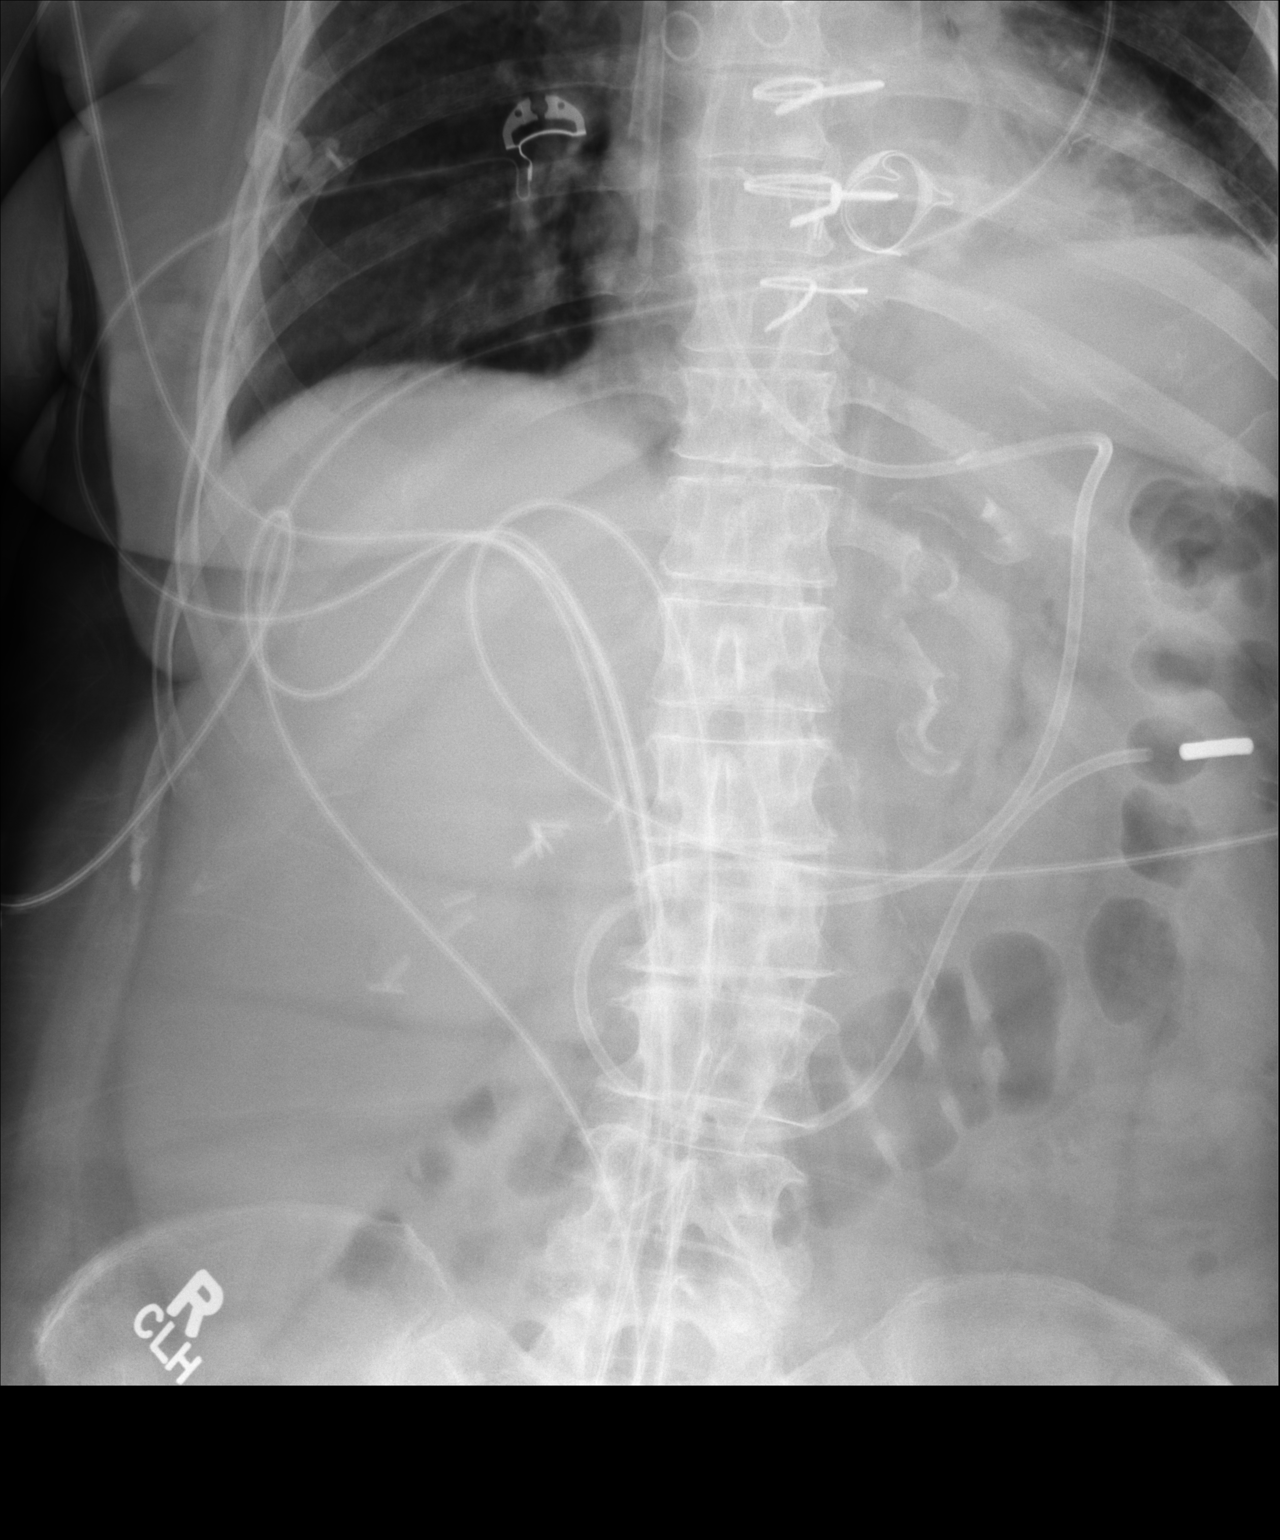

[1 of 1 positions shown; findings below may reference images not displayed]

FINDINGS: Feeding tube tip at the level of the ligament of Treitz.

Visualized bowel gas pattern is nonobstructive.

Re- demonstrated left diaphragmatic elevation and retrocardiac
opacification.

Surgical changes include aortic valve replacement, CABG, and
cholecystectomy.

Prominent liver size, reaching into the right iliac fossa.
IMPRESSION: Feeding tube tip at the ligament of Treitz.

## 2014-10-19 IMAGING — CR DG ABD PORTABLE 1V
1 series · 1 of 1 positions shown · non-contrast
Comparison: 03/06/2014 and 03/02/2014

CLINICAL DATA: Panda placement.

EXAM:
PORTABLE ABDOMEN - 1 VIEW

[AP]
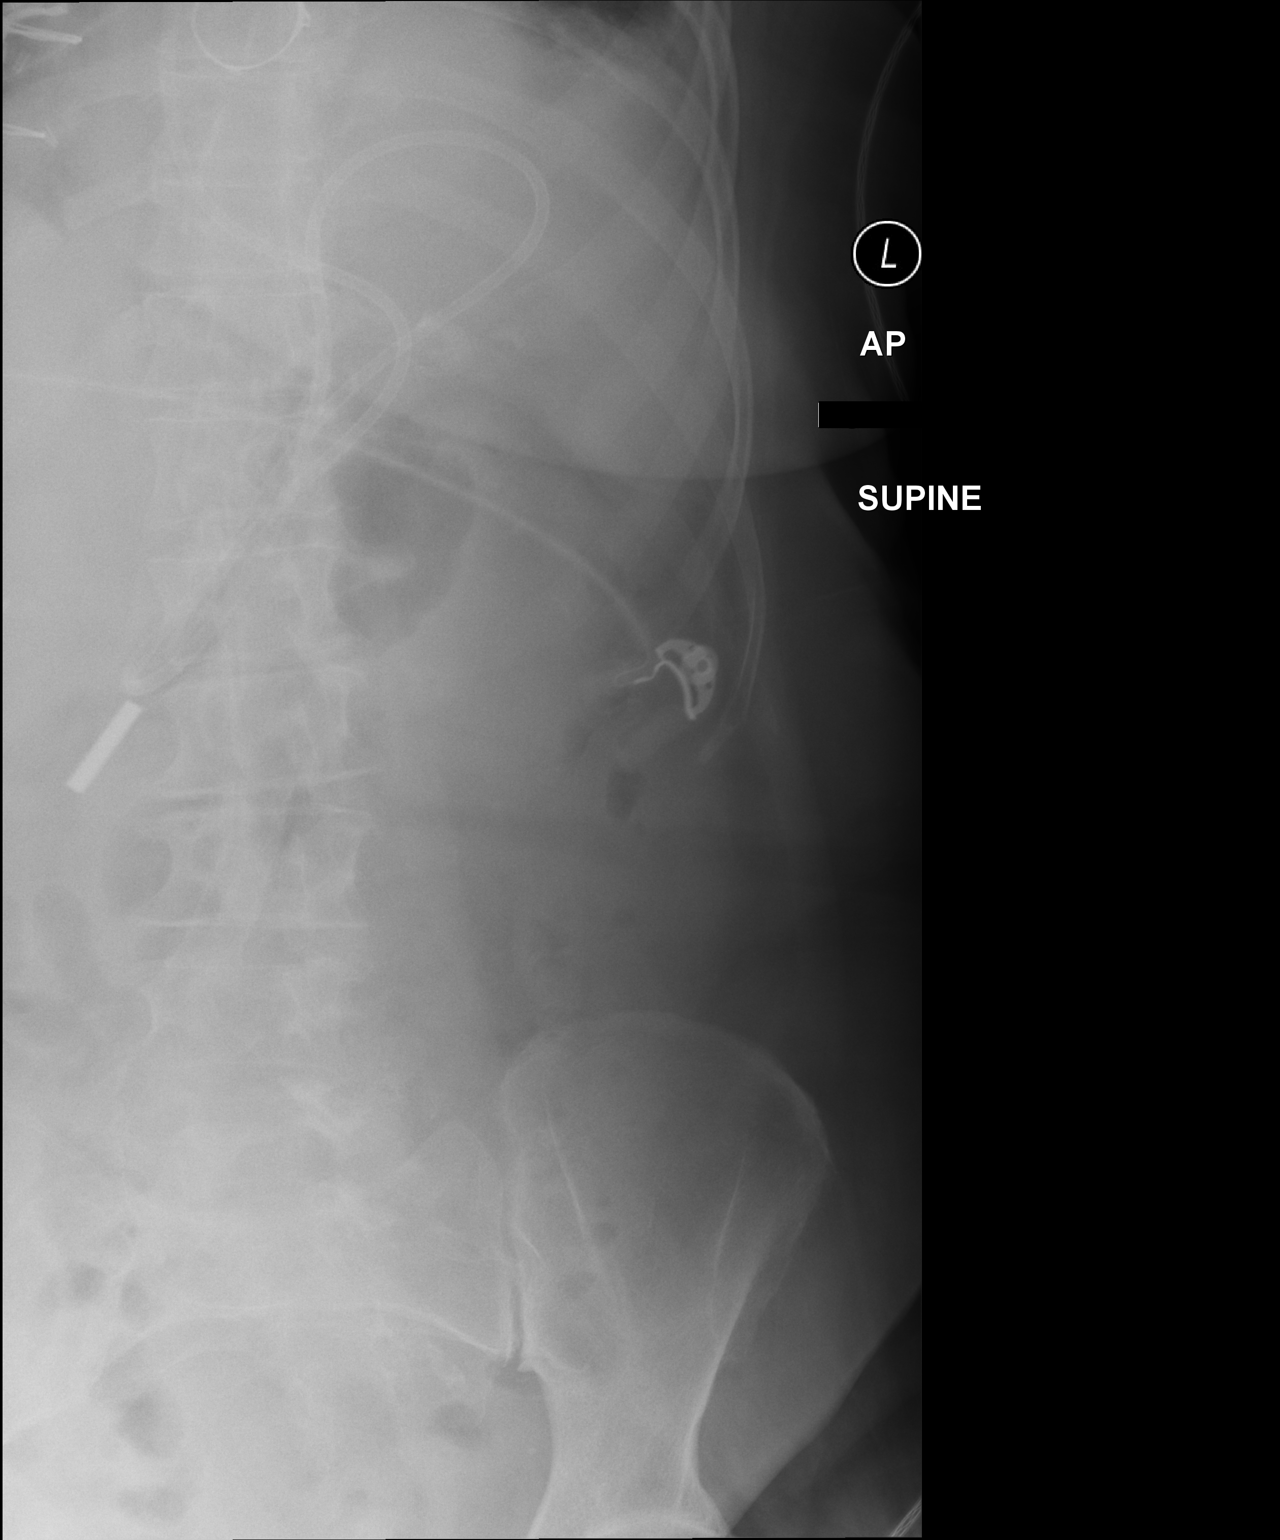

[1 of 1 positions shown; findings below may reference images not displayed]

FINDINGS: Feeding tube is present which appears to have been pulled back and
now is coiled over the stomach with tip just right of midline in the
mid abdomen likely over the distal stomach/proximal duodenum. Bowel
gas pattern is nonobstructive. Remainder of the exam is unchanged.
IMPRESSION: Feeding tube has been pulled back and now is coiled over the stomach
with tip right of midline in the mid abdomen likely over the distal
stomach or proximal duodenum.

## 2014-10-28 IMAGING — CR DG ABD PORTABLE 1V
1 series · 1 of 1 positions shown · non-contrast
Comparison: Abdominal films [DATE] and March 06, 2014

CLINICAL DATA: Nausea and vomiting.

EXAM:
PORTABLE ABDOMEN - 1 VIEW

[supine ap]
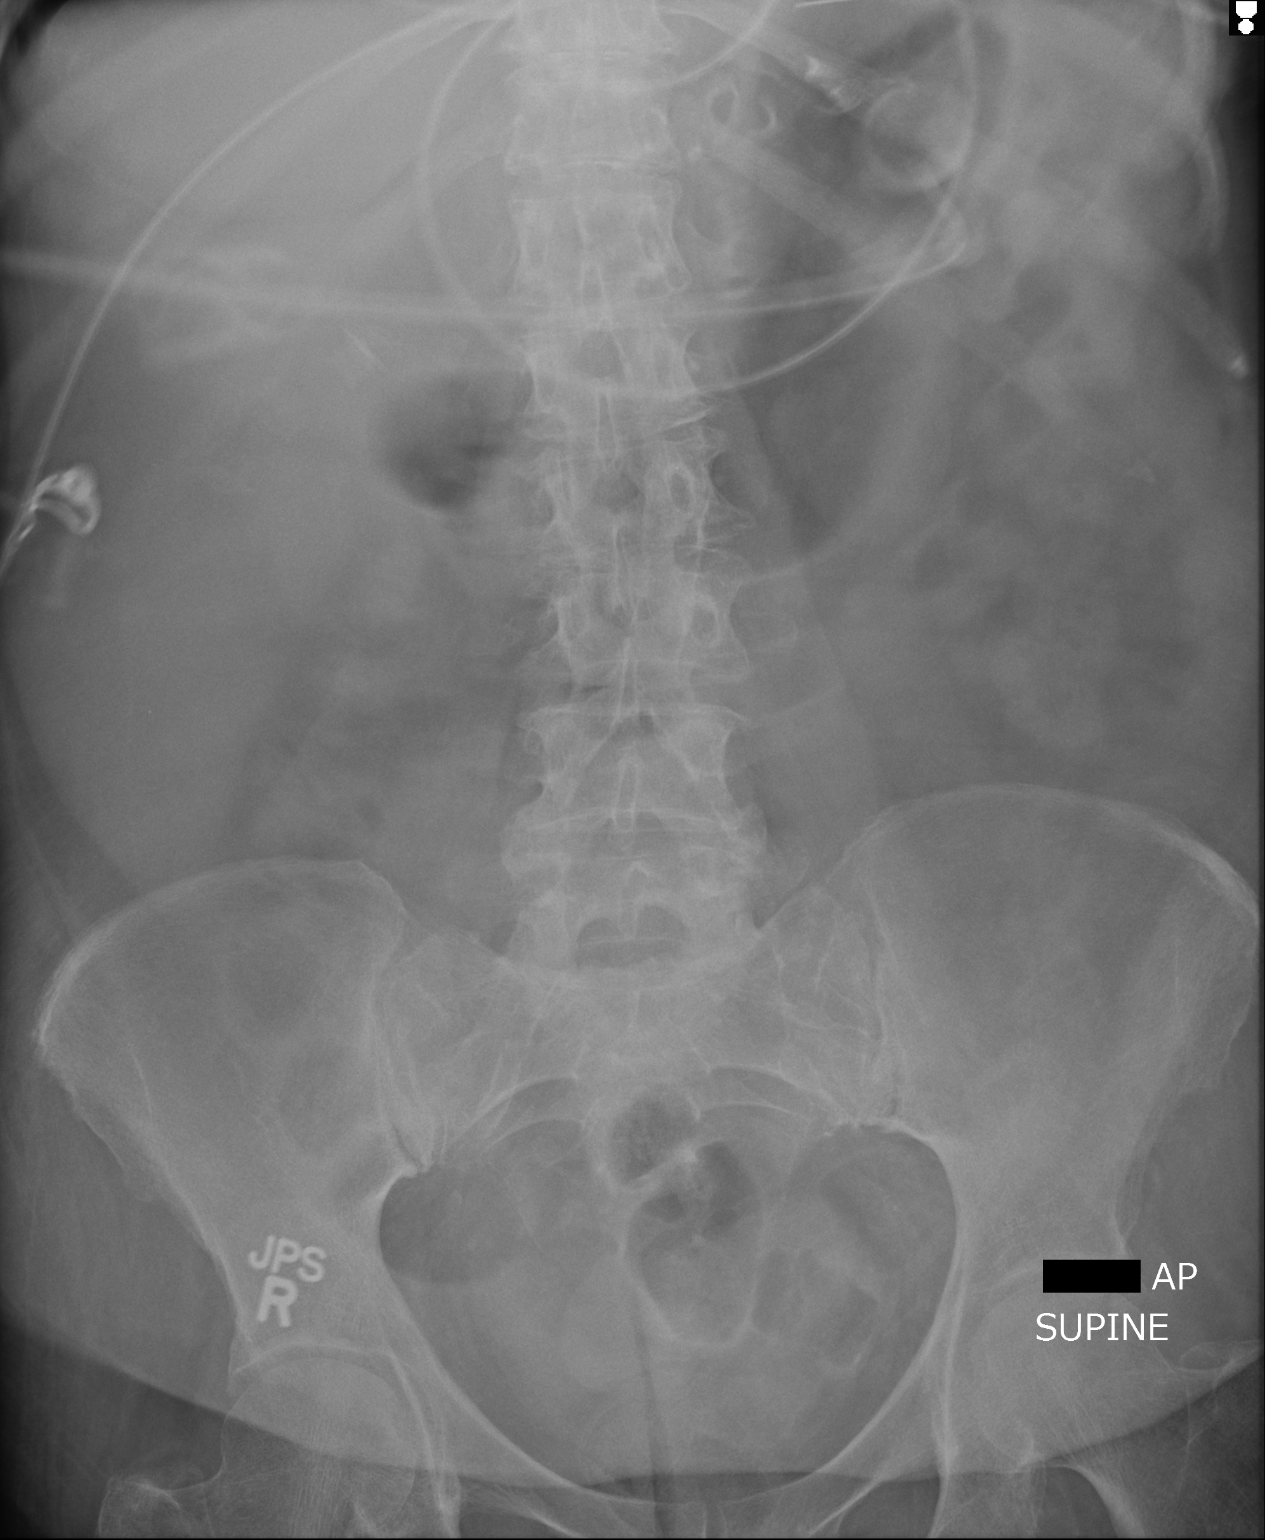

[1 of 1 positions shown; findings below may reference images not displayed]

FINDINGS: There is a small amount of gas within the stomach. A gastrostomy
tube is present. The small and large bowel gas pattern is normal.
There is a small amount of stool and gas in the rectum. There
surgical clips in the right upper quadrant of the abdomen. There are
stable calcifications in the region of the splenic artery.
IMPRESSION: The bowel gas pattern is within the limits of normal. There is no
evidence of obstruction or ileus.

## 2014-10-31 IMAGING — CR DG CHEST 1V PORT
1 series · 1 of 1 positions shown · non-contrast
Comparison: 03/21/2014

CLINICAL DATA: IJ placement

EXAM:
PORTABLE CHEST - 1 VIEW

[AP]
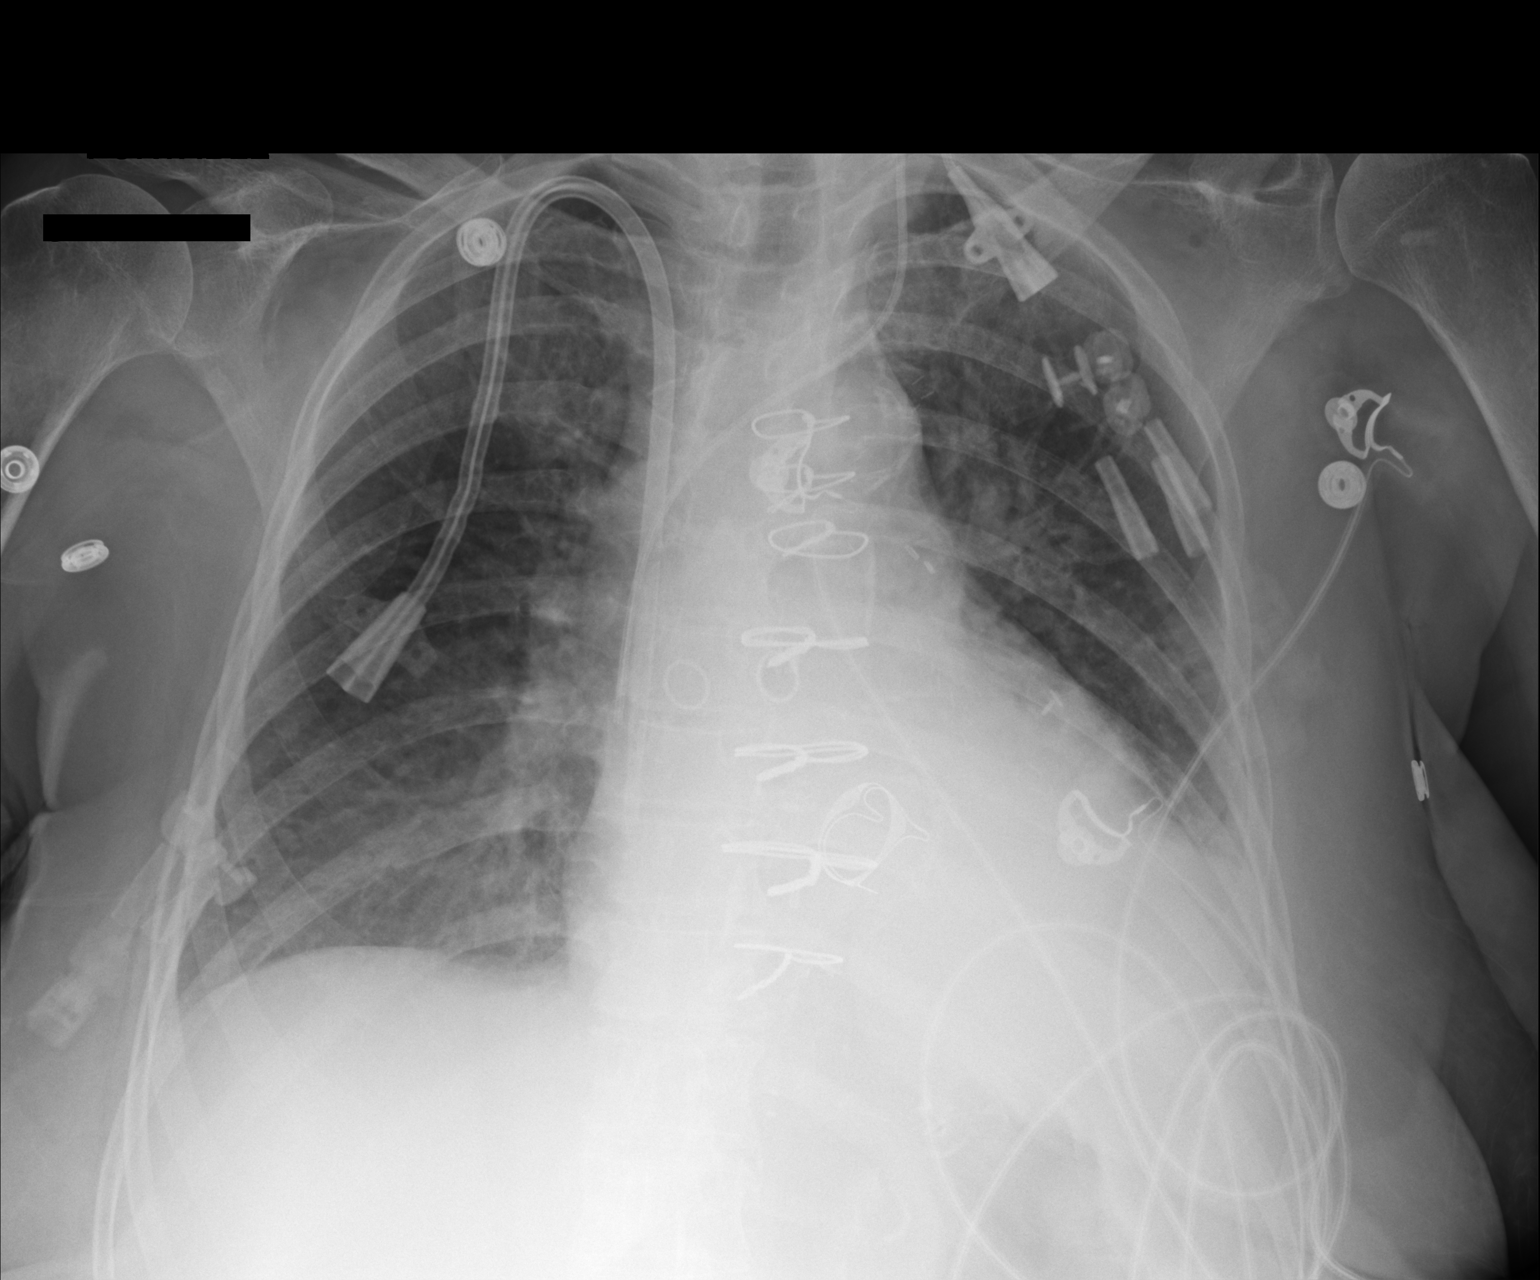

[1 of 1 positions shown; findings below may reference images not displayed]

FINDINGS: Interval placement of a left central venous catheter. Tip overlies
the cavoatrial junction. No pneumothorax. No change in positioning
of right central venous catheter. Postoperative change in the
mediastinum. Mild cardiac enlargement with central pulmonary
vascular congestion. No definite edema. Small left pleural effusion.
Calcification of the aorta.
IMPRESSION: Appliances appear in satisfactory location. Cardiac enlargement with
central pulmonary vascular congestion and small left pleural
effusion.
